# Patient Record
Sex: Female | Born: 1940 | Race: White | Hispanic: No | Marital: Single | Smoking: Current every day smoker
Health system: Southern US, Community
[De-identification: ages and names within clinical notes are randomized; demographics above are authoritative.]

## PROBLEM LIST (undated history)

## (undated) DIAGNOSIS — F419 Anxiety disorder, unspecified: Secondary | ICD-10-CM

## (undated) DIAGNOSIS — Z87442 Personal history of urinary calculi: Secondary | ICD-10-CM

## (undated) DIAGNOSIS — I712 Thoracic aortic aneurysm, without rupture, unspecified: Secondary | ICD-10-CM

## (undated) DIAGNOSIS — I429 Cardiomyopathy, unspecified: Secondary | ICD-10-CM

## (undated) DIAGNOSIS — M1990 Unspecified osteoarthritis, unspecified site: Secondary | ICD-10-CM

## (undated) DIAGNOSIS — F32A Depression, unspecified: Secondary | ICD-10-CM

## (undated) DIAGNOSIS — J449 Chronic obstructive pulmonary disease, unspecified: Secondary | ICD-10-CM

## (undated) DIAGNOSIS — I700 Atherosclerosis of aorta: Secondary | ICD-10-CM

## (undated) DIAGNOSIS — I10 Essential (primary) hypertension: Secondary | ICD-10-CM

## (undated) DIAGNOSIS — C3490 Malignant neoplasm of unspecified part of unspecified bronchus or lung: Secondary | ICD-10-CM

## (undated) DIAGNOSIS — I639 Cerebral infarction, unspecified: Secondary | ICD-10-CM

## (undated) DIAGNOSIS — C801 Malignant (primary) neoplasm, unspecified: Secondary | ICD-10-CM

## (undated) DIAGNOSIS — E119 Type 2 diabetes mellitus without complications: Secondary | ICD-10-CM

## (undated) DIAGNOSIS — I779 Disorder of arteries and arterioles, unspecified: Secondary | ICD-10-CM

## (undated) DIAGNOSIS — Z720 Tobacco use: Secondary | ICD-10-CM

## (undated) HISTORY — PX: EYE SURGERY: SHX253

## (undated) HISTORY — PX: CHOLECYSTECTOMY: SHX55

## (undated) HISTORY — PX: ABDOMINAL HYSTERECTOMY: SHX81

---

## 2006-01-10 ENCOUNTER — Ambulatory Visit: Payer: Self-pay | Admitting: Family Medicine

## 2006-01-10 IMAGING — MG UNKNOWN MG STUDY
1 series · 4 of 4 positions shown · non-contrast
Comparison: none

REASON FOR EXAM: Screening Mammo
COMMENTS:

[R CC · right · 4 of 4 slices shown]
[im 1/4]
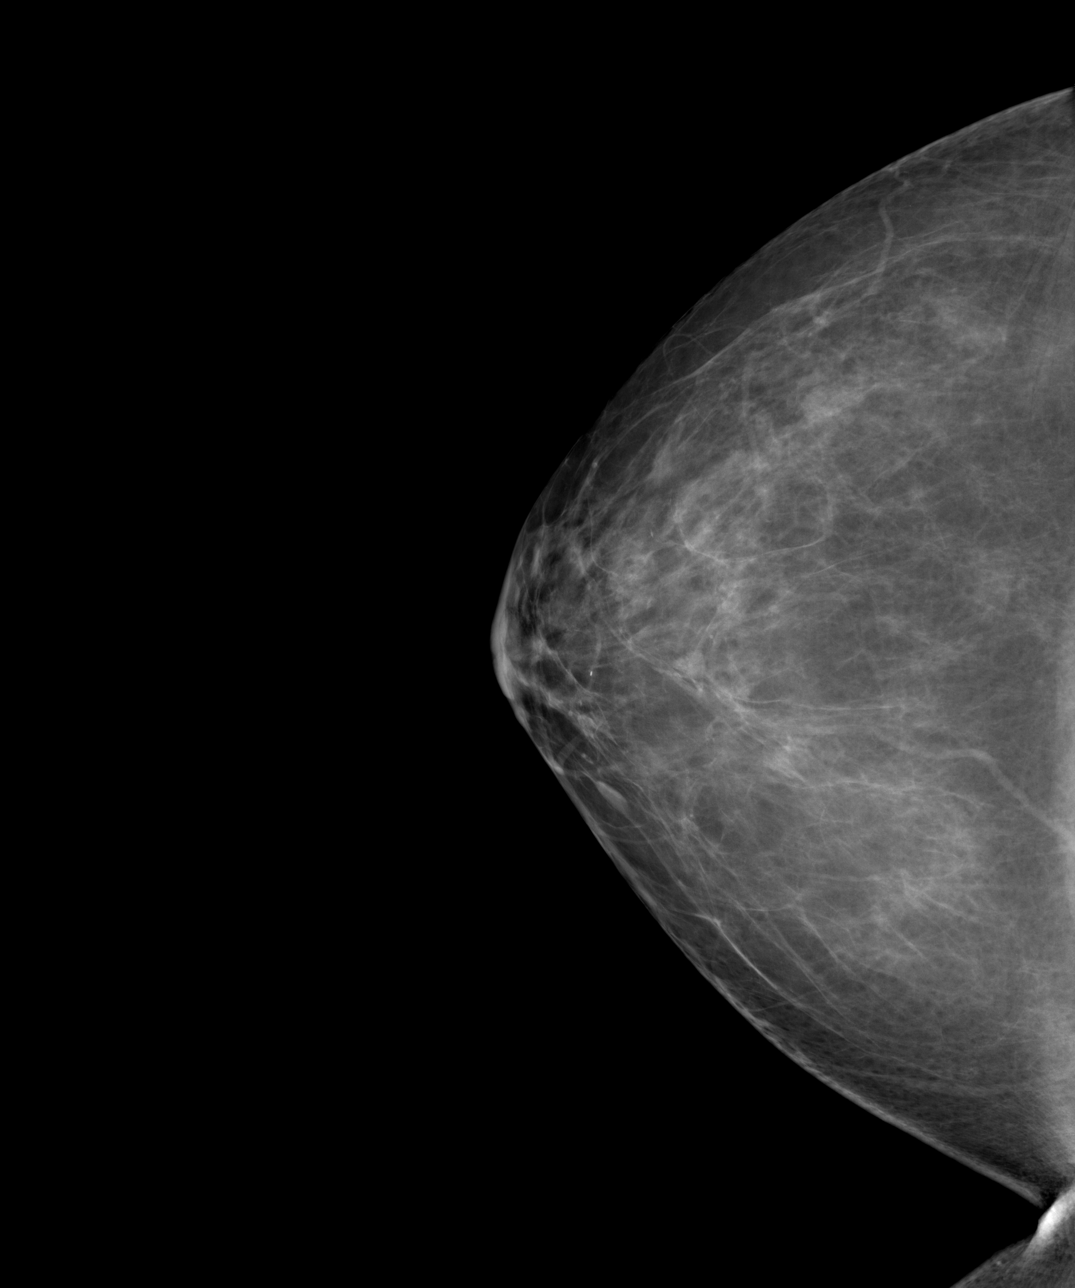
[im 2/4]
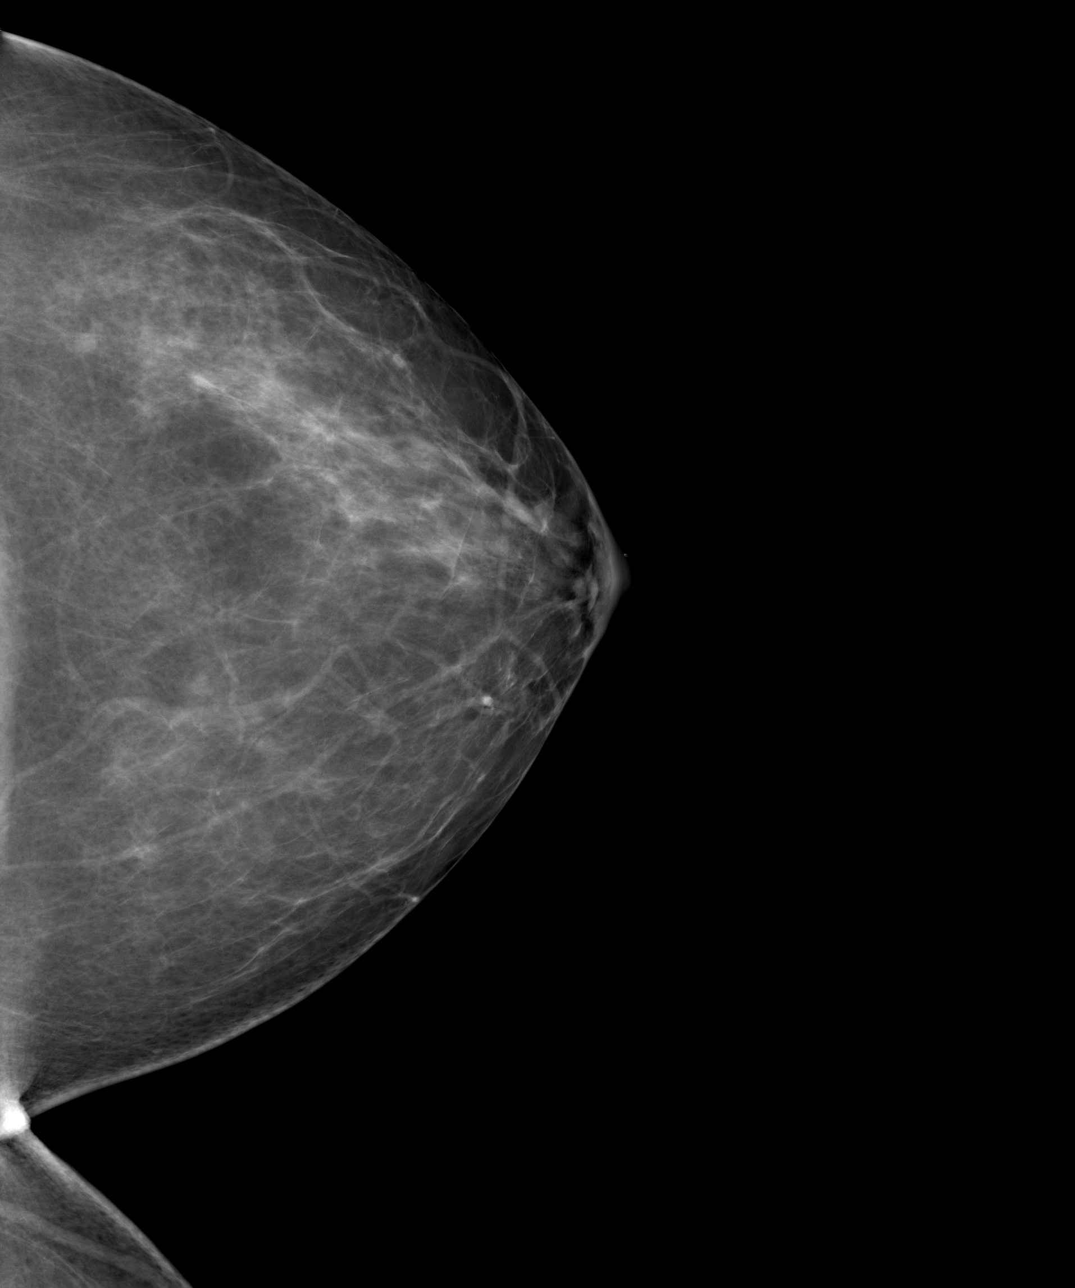
[im 3/4]
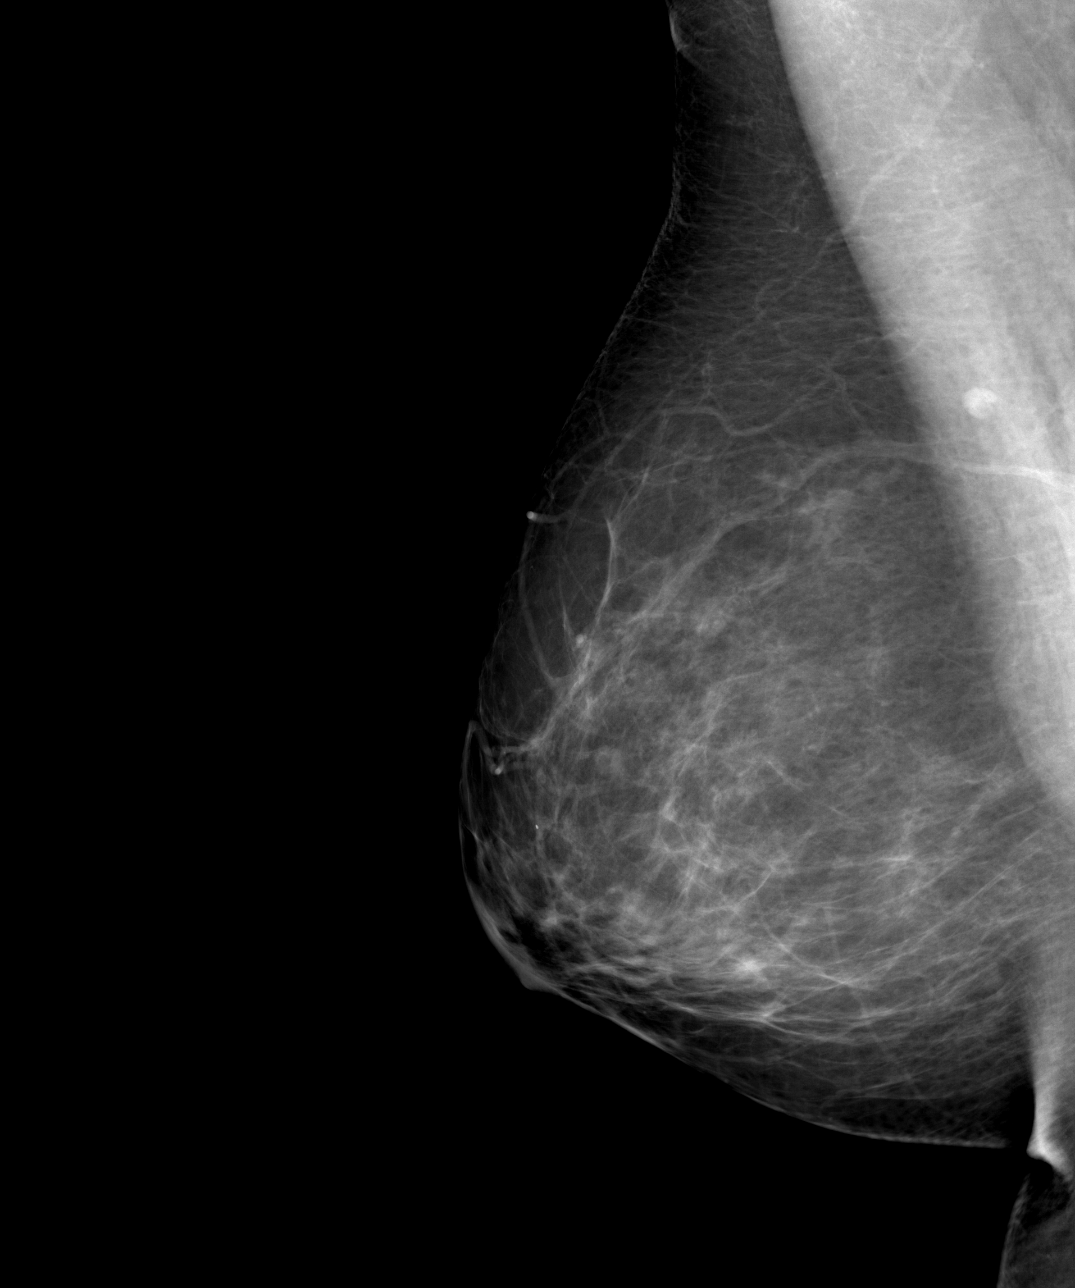
[im 4/4]
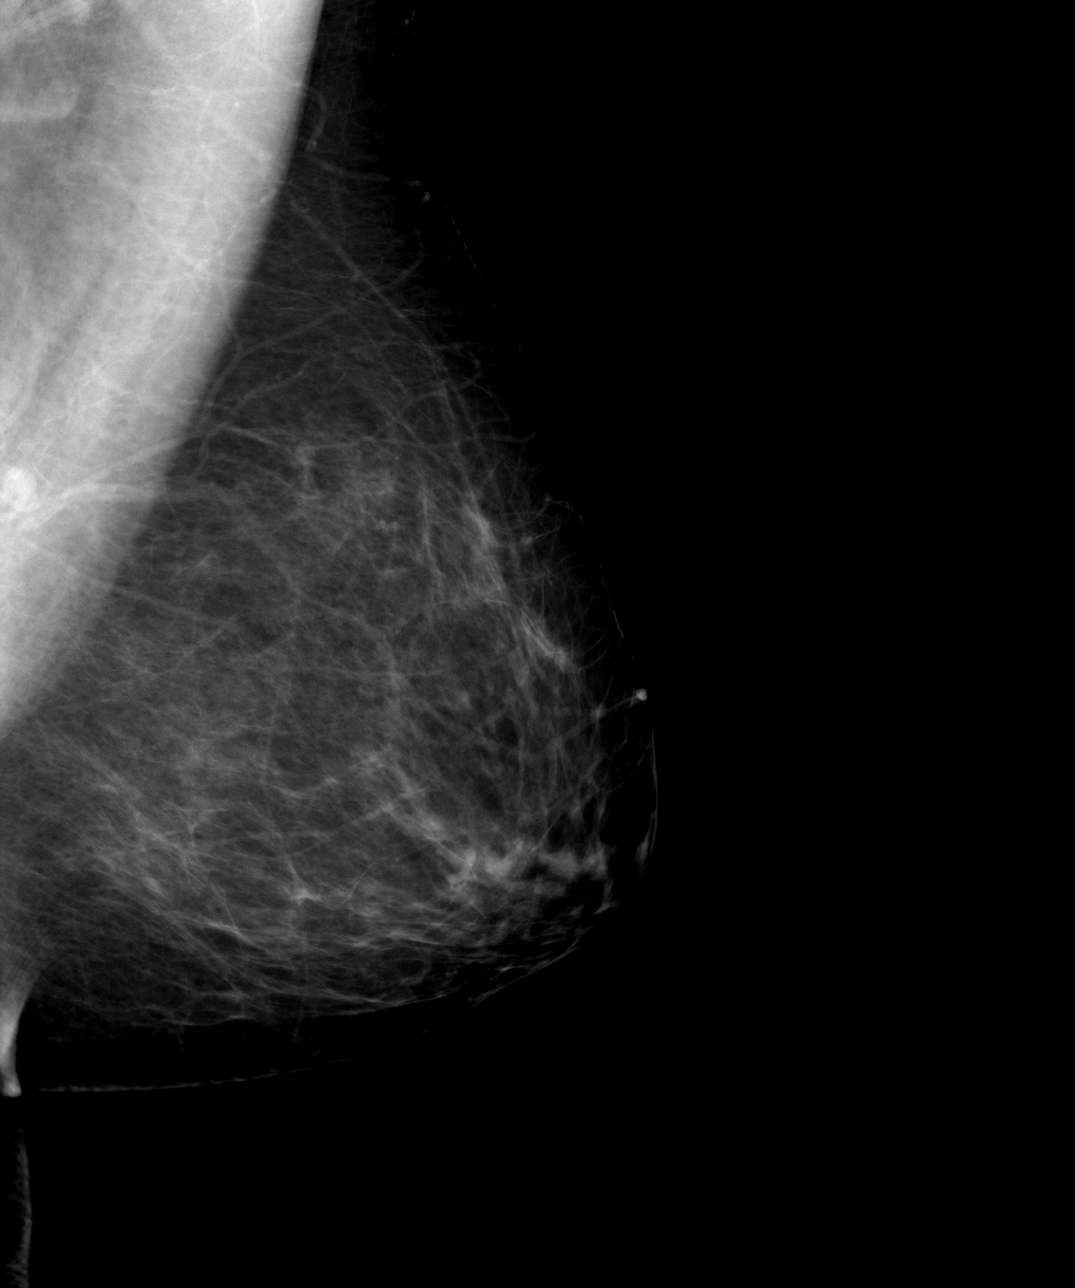

[4 of 4 positions shown; findings below may reference images not displayed]

PROCEDURE:     MAM - MAM DGTL SCREENING MAMMO W/CAD  - [DATE]  [DATE]

RESULT:     Comparison is made to a study [DATE].

The breasts exhibit a moderately dense parenchymal pattern. There is no
dominant mass. There are benign appearing lymph nodes in the axillary region
on the LEFT. A nodule consistent with an intramammary lymph node on the
RIGHT is present as well.
IMPRESSION: 1)I do not see findings suspicious for malignancy.

BI-RADS: Category 2-Benign Finding

RECOMMENDATIONS:

1)Please continue to encourage yearly mammographic follow-up.

A NEGATIVE MAMMOGRAM REPORT DOES NOT PRECLUDE BIOPSY OR OTHER EVALUATION OF
A CLINICALLY PALPABLE OR OTHERWISE SUSPICIOUS MASS OR LESION.  BREAST CANCER
MAY NOT BE DETECTED BY MAMMOGRAPHY IN UP TO 10% OF CASES.

## 2007-04-15 ENCOUNTER — Ambulatory Visit: Payer: Self-pay | Admitting: Family Medicine

## 2007-04-15 IMAGING — MG MAM DGTL SCREENING MAMMO W/CAD
1 series · 4 of 4 positions shown · non-contrast
Comparison: none

REASON FOR EXAM: SCR MAMMO
COMMENTS:

PROCEDURE:     MAM - MAM DGTL SCREENING MAMMO W/CAD  - [DATE]  [DATE]
RESULT:      Comparison is made to prior examinations of [DATE] and
[DATE].
No mass or malignant appearing calcifications are seen. The breast
parenchyma is predominantly fatty.

[Series 3035: R CC · right · 4 of 4 slices shown]
[im 1/4]
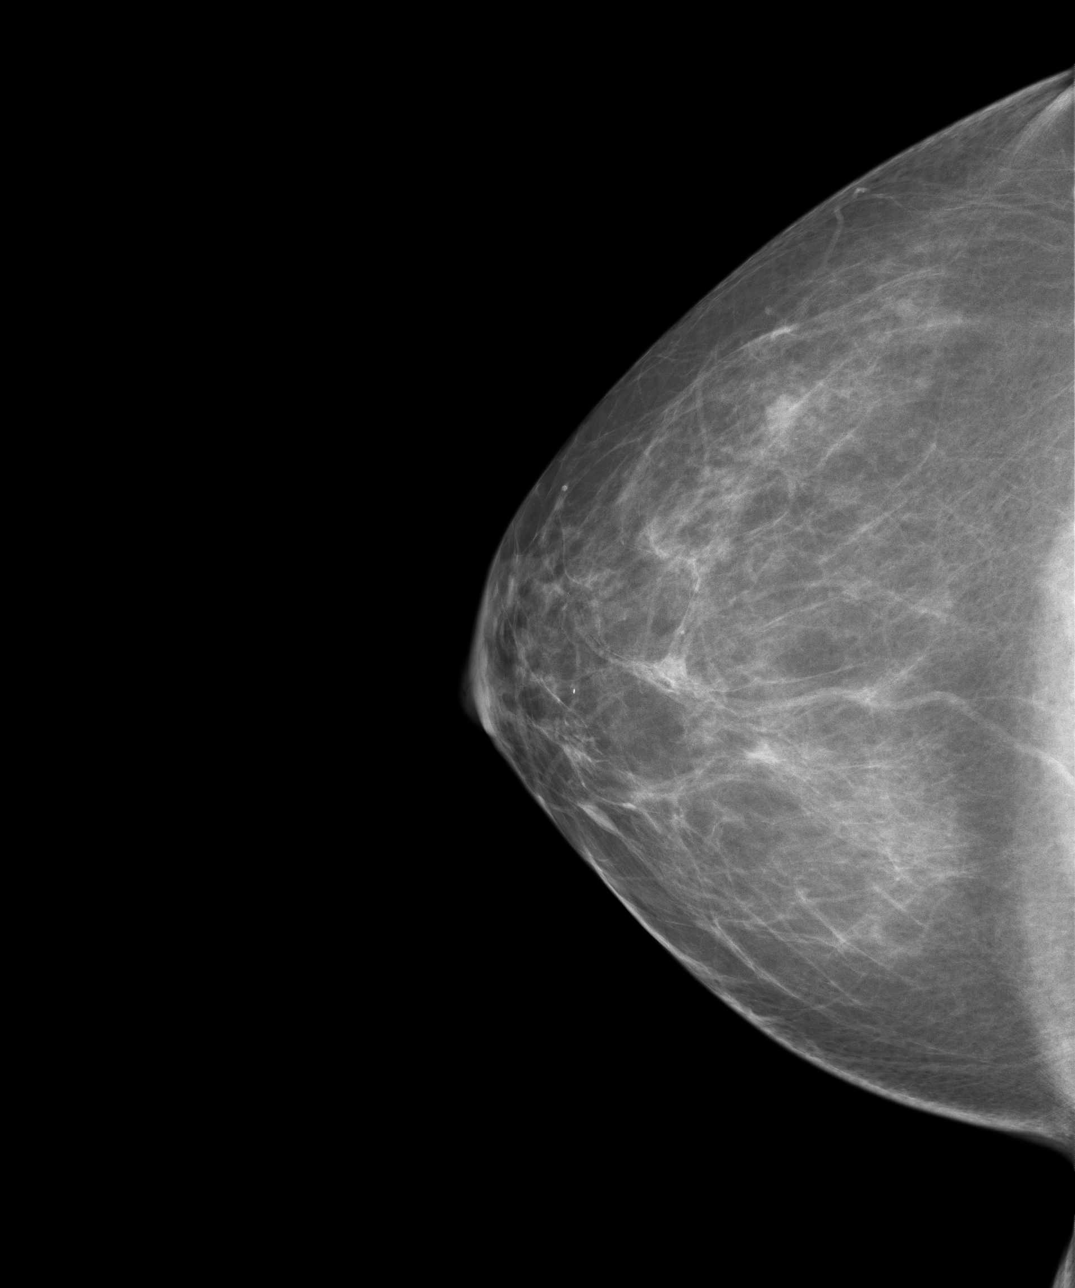
[im 2/4]
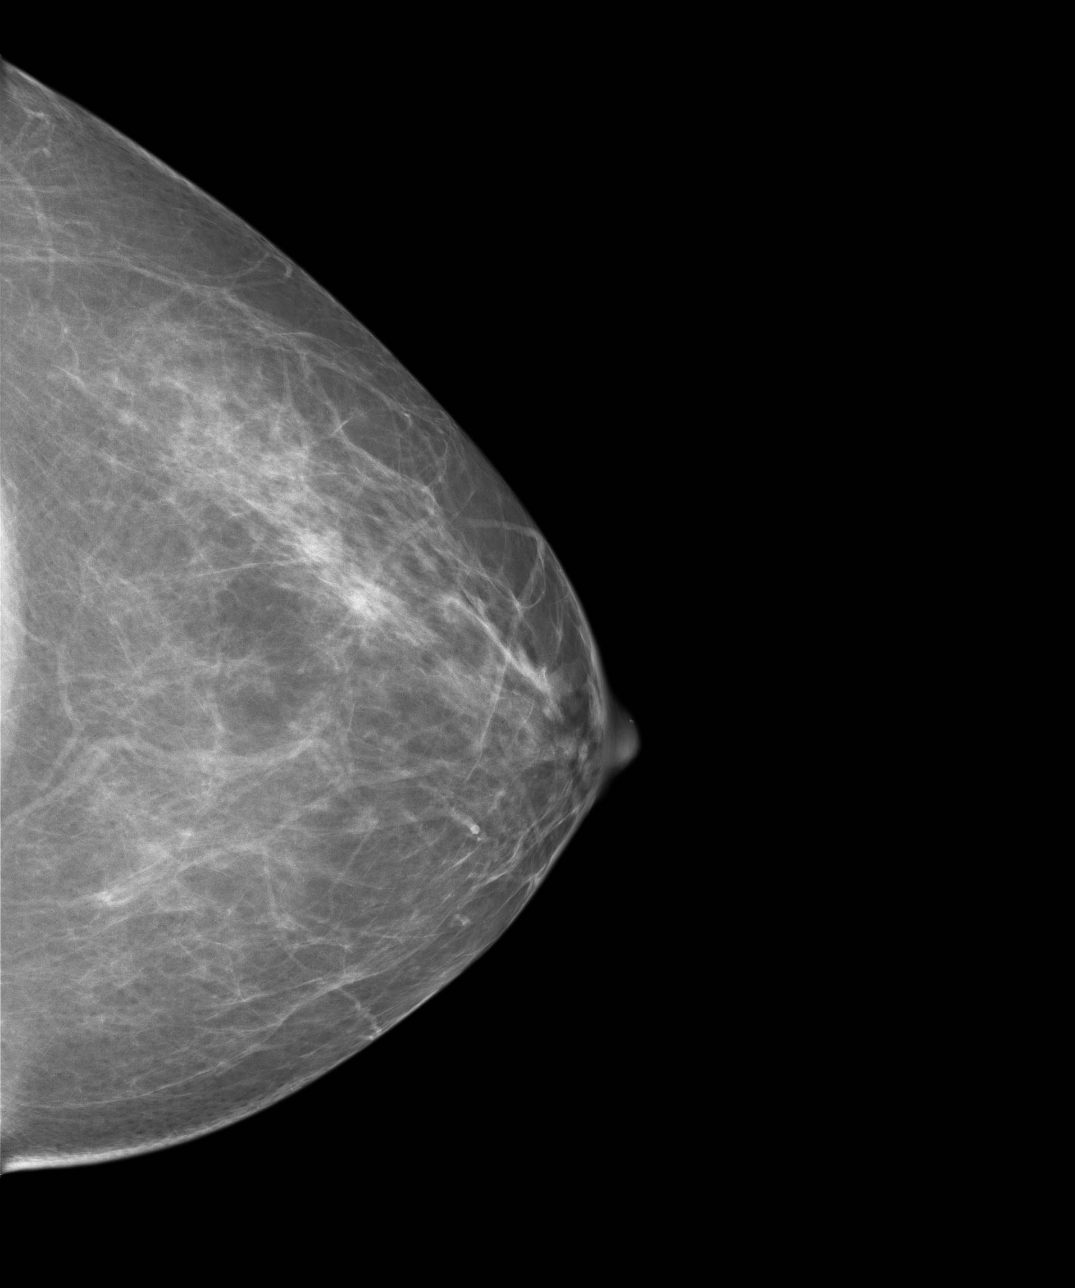
[im 3/4]
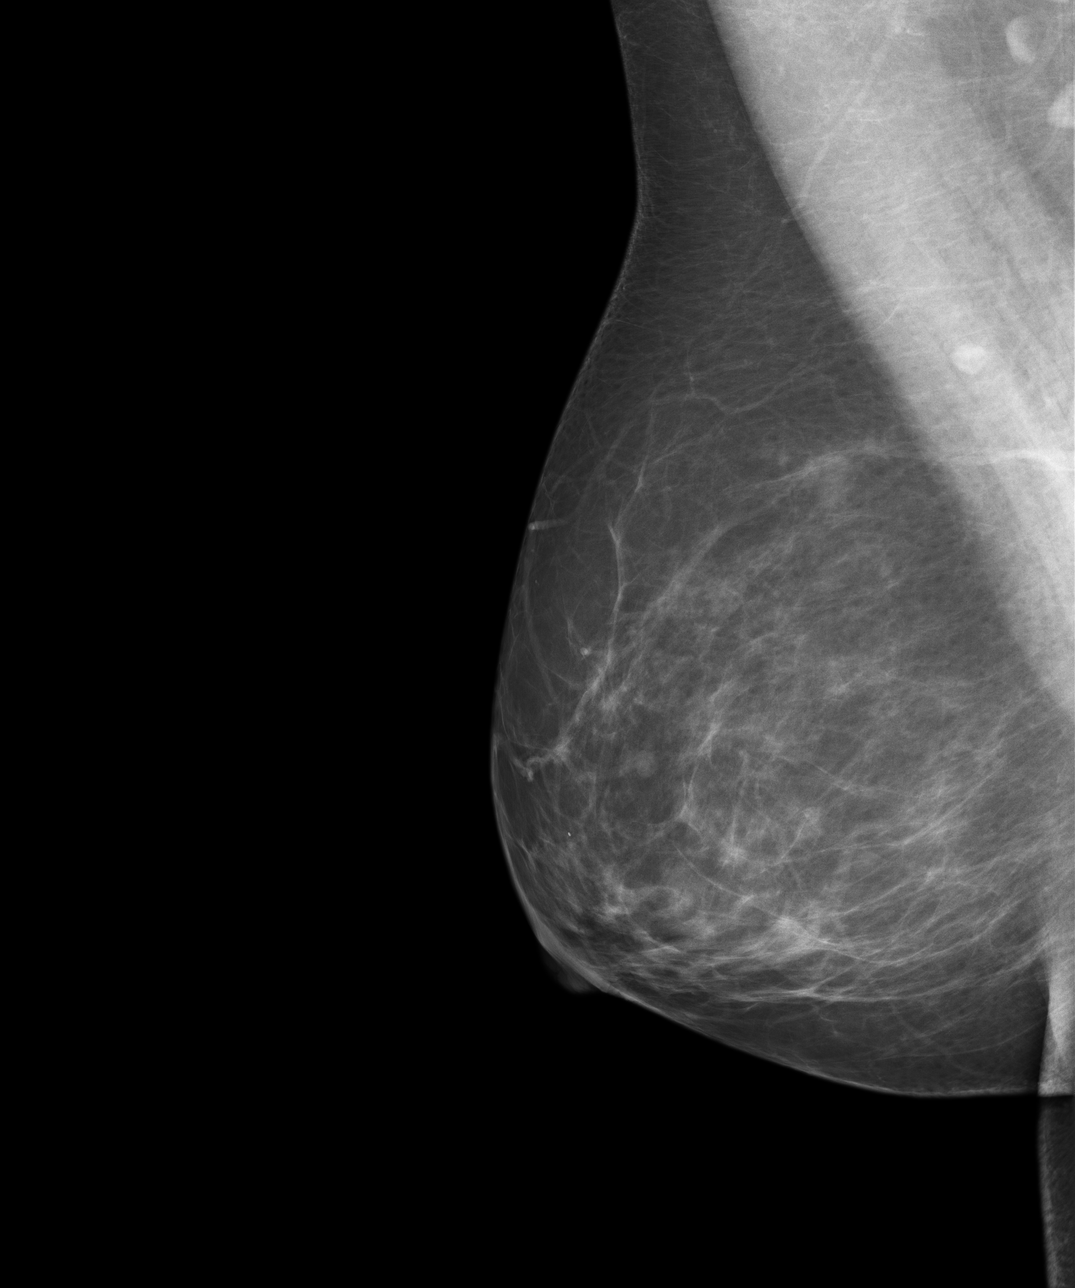
[im 4/4]
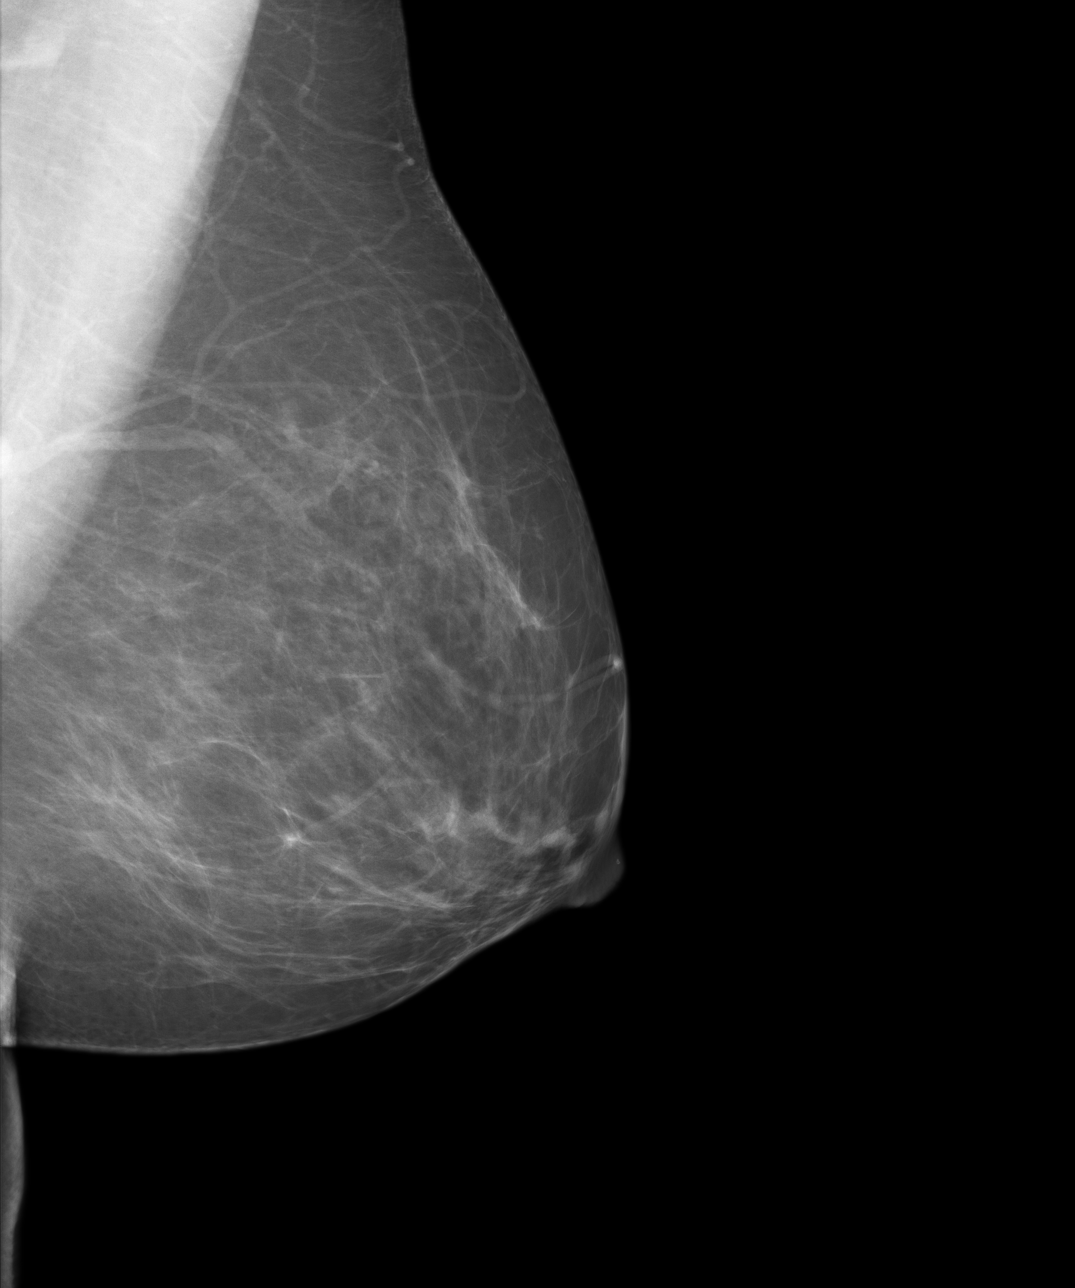

[4 of 4 positions shown; findings below may reference images not displayed]

IMPRESSION: 1.     Bilaterally benign appearing screening mammography.
2.     Continued annual screening mammography is recommended.
3.     BI-RADS:  Category 2-Benign Finding.

A NEGATIVE MAMMOGRAM REPORT DOES NOT PRECLUDE BIOPSY OR OTHER EVALUATION OF
A CLINICALLY PALPABLE OR OTHERWISE SUPSICIOUS MASS OR LESION.  BREAST CANCER
MAY NOT BE DETECTED BY MAMMOGRAPHY IN UP TO 10% OF CASES.

## 2009-10-06 ENCOUNTER — Ambulatory Visit: Payer: Self-pay | Admitting: Family Medicine

## 2009-10-06 IMAGING — MG MAM DGTL SCREENING MAMMO W/CAD
1 series · 4 of 4 positions shown · non-contrast
Comparison: none

REASON FOR EXAM: SCREENING
COMMENTS:

[Series 998: R CC · right · 4 of 4 slices shown]
[im 1/4]
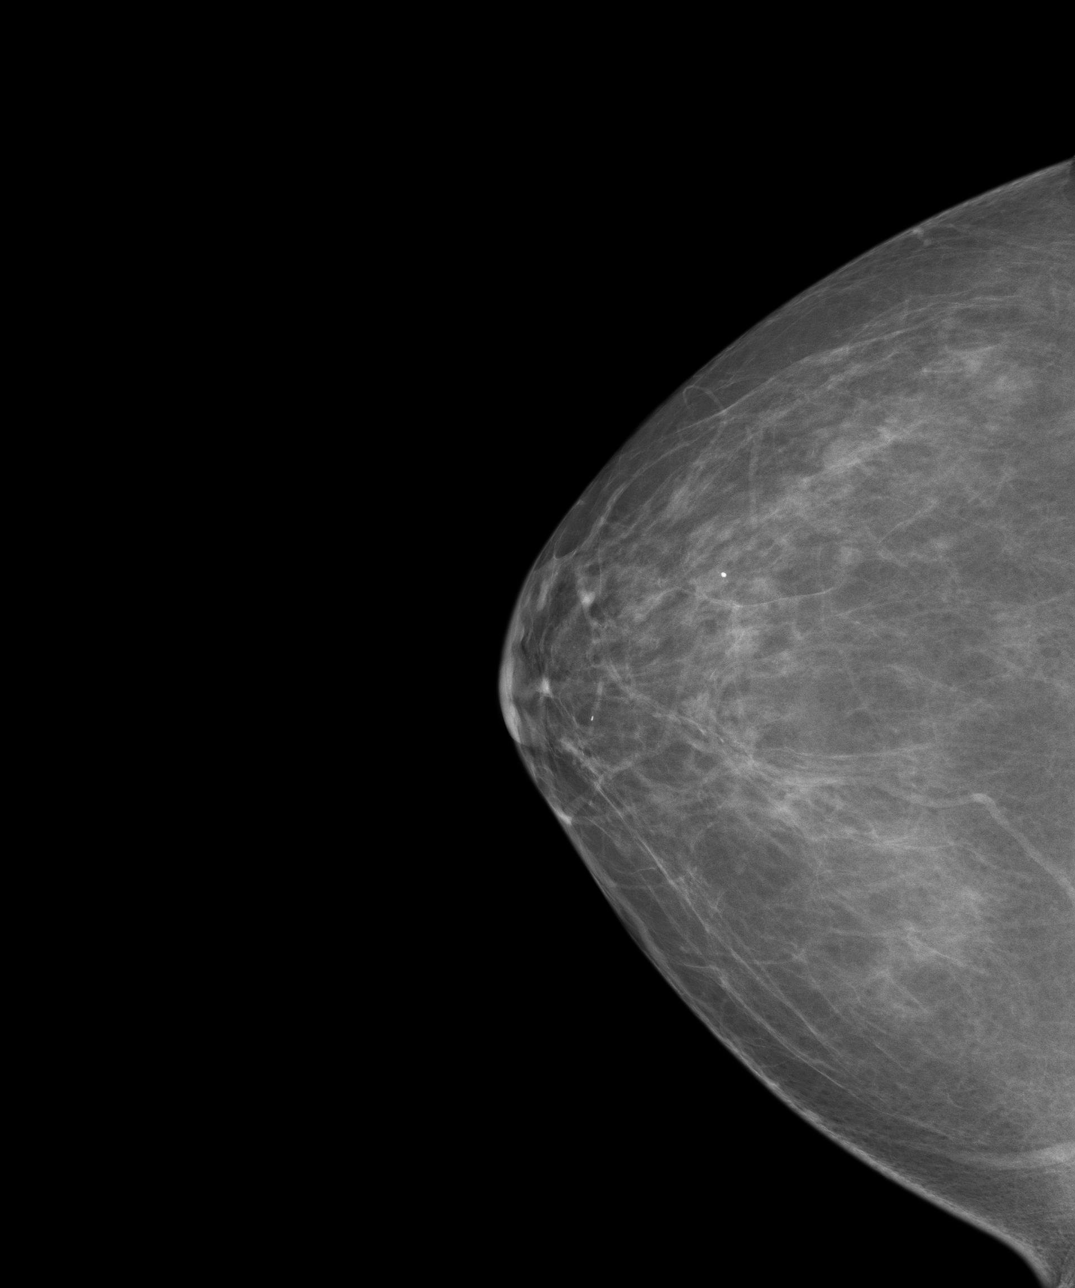
[im 2/4]
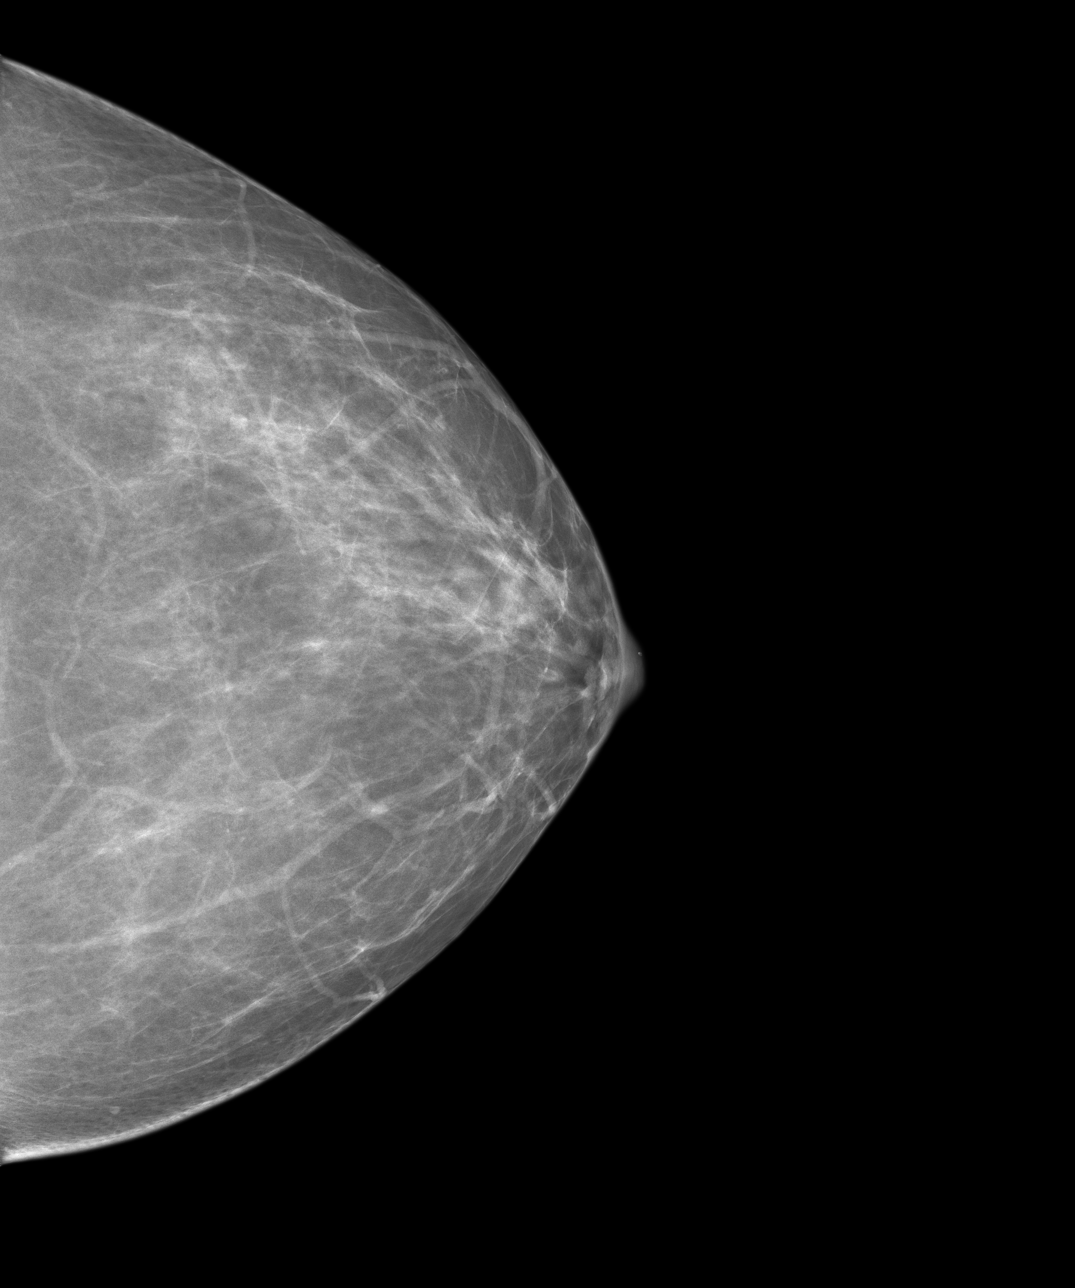
[im 3/4]
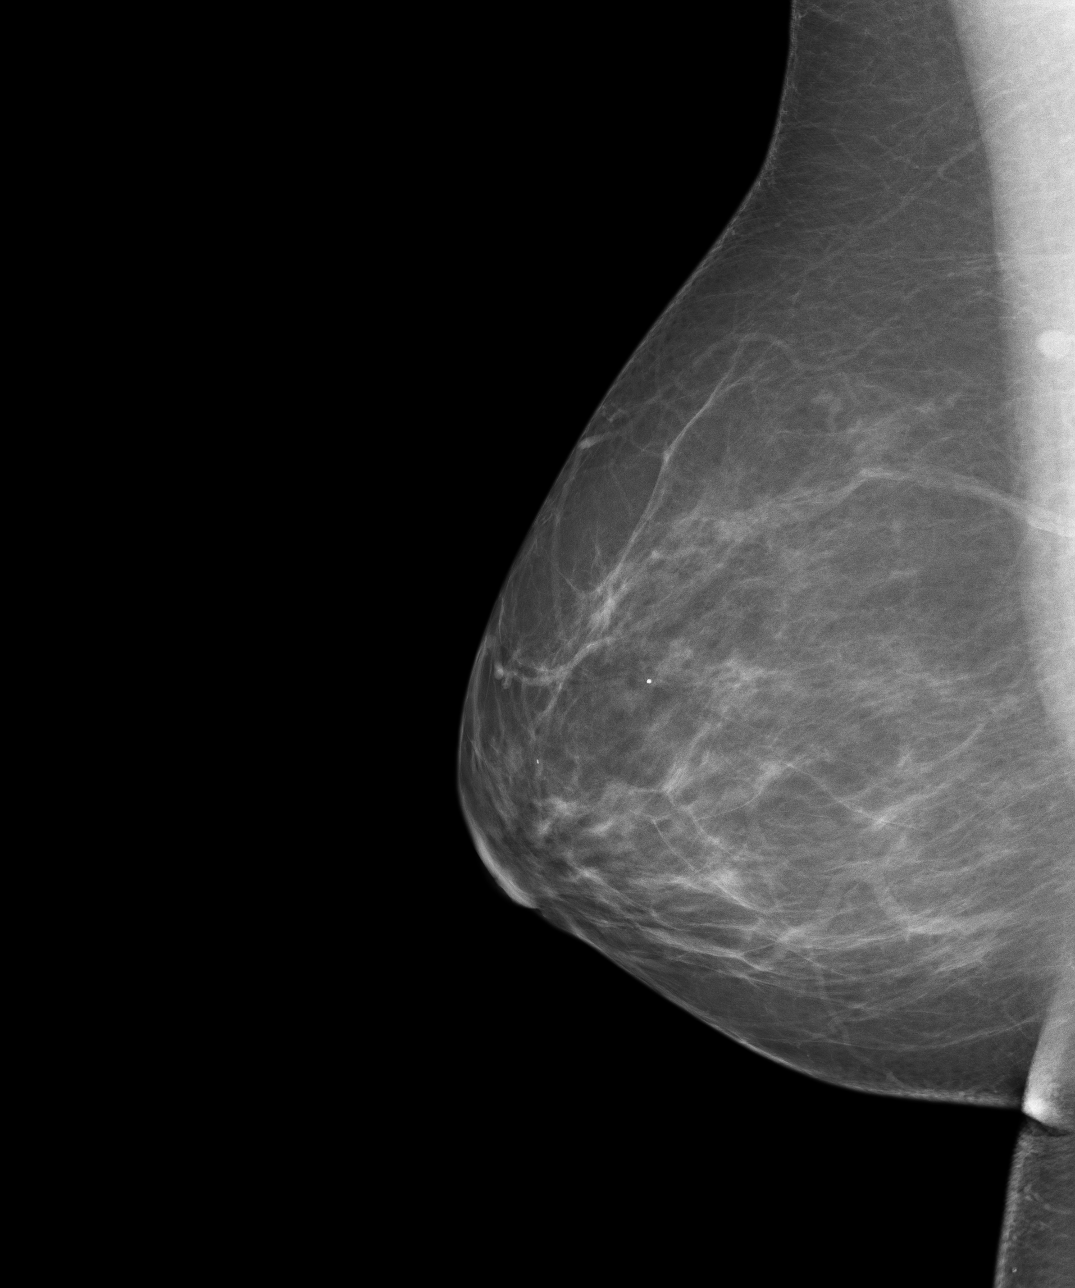
[im 4/4]
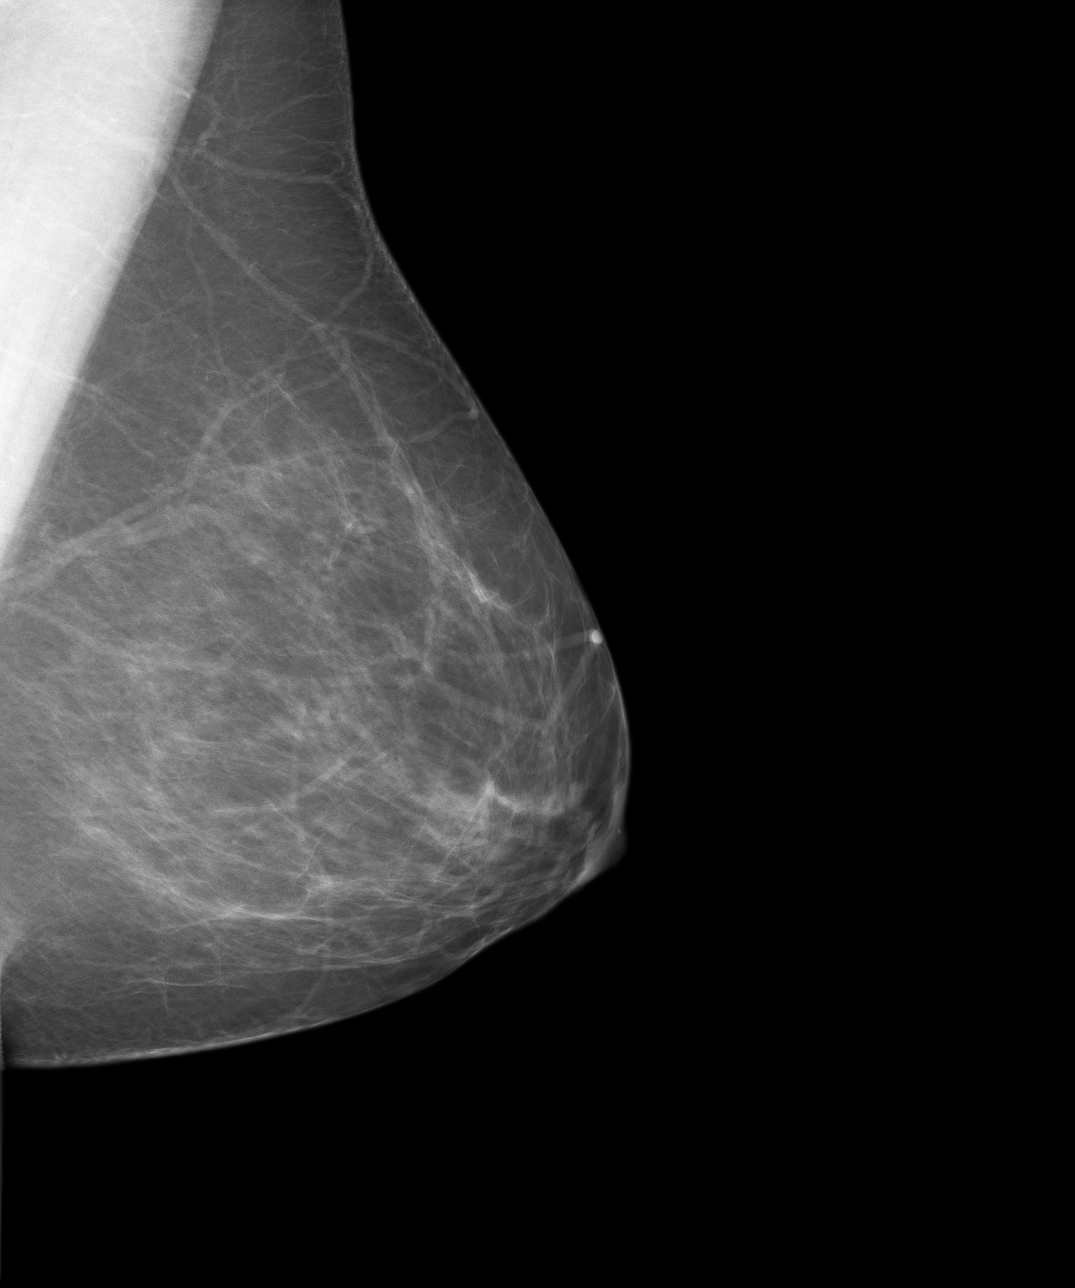

[4 of 4 positions shown; findings below may reference images not displayed]

PROCEDURE:     MAM - MAM DGTL SCREENING MAMMO W/CAD  - [DATE]  [DATE]

RESULT:     Comparison is made to a prior digital study [DATE]
and [DATE].

The breasts exhibit a moderately dense parenchymal pattern with evidence of
ongoing involution. There is no dominant mass. There are no malignant
appearing groupings of microcalcification.
IMPRESSION: 1.I do not see findings suspicious for malignancy.

BI-RADS: Category 2 - Benign Findings

RECOMMENDATIONS:

1.     Please continue to encourage yearly mammographic follow-up.

A NEGATIVE MAMMOGRAM REPORT DOES NOT PRECLUDE BIOPSY OR OTHER EVALUATION OF
A CLINICALLY PALPABLE OR OTHERWISE SUSPICIOUS MASS OR LESION. BREAST CANCER
MAY NOT BE DETECTED BY MAMMOGRAPHY IN UP TO 10% OF CASES.

## 2011-06-28 ENCOUNTER — Ambulatory Visit: Payer: Self-pay | Admitting: Family Medicine

## 2011-06-28 IMAGING — MG MAM DGTL SCRN MAM NO ORDER W/CAD
1 series · 4 of 4 positions shown · non-contrast
Comparison: none

REASON FOR EXAM: SCR MAMMO NO ORDER
COMMENTS:

PROCEDURE:     MAM - MAM DGTL SCRN MAM NO ORDER W/CAD  - [DATE]  [DATE]
RESULT:     Comparison is made to prior examinations [DATE],[DATE] and [DATE].
There are scattered fibroglandular densities bilaterally. No dominant mass
or malignant-appearing microcalcifications are seen.

[R CC · right · 4 of 4 slices shown]
[im 1/4]
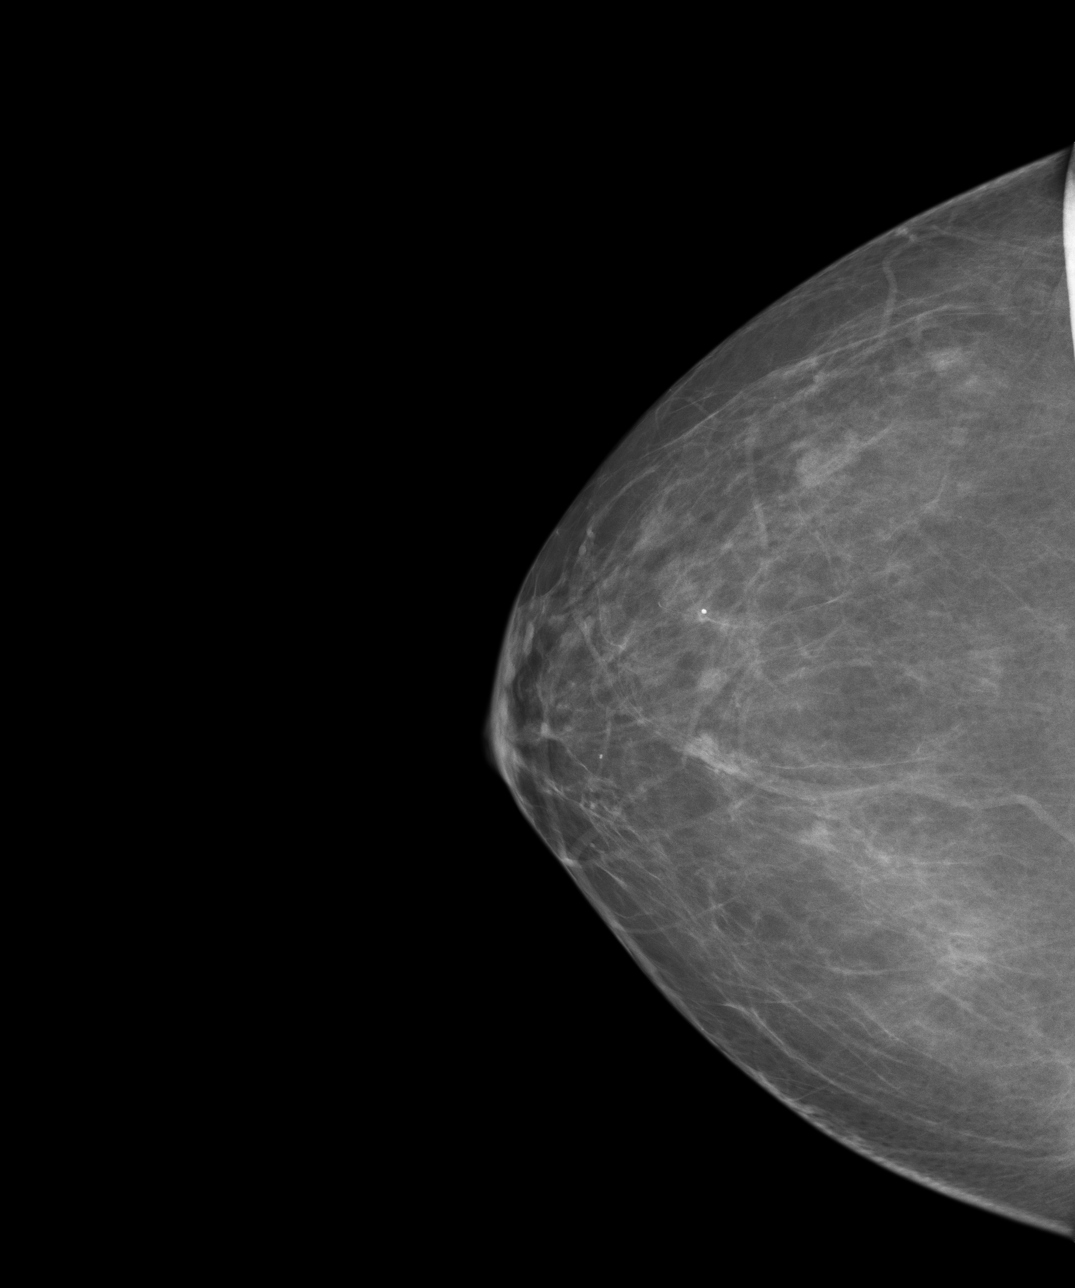
[im 2/4]
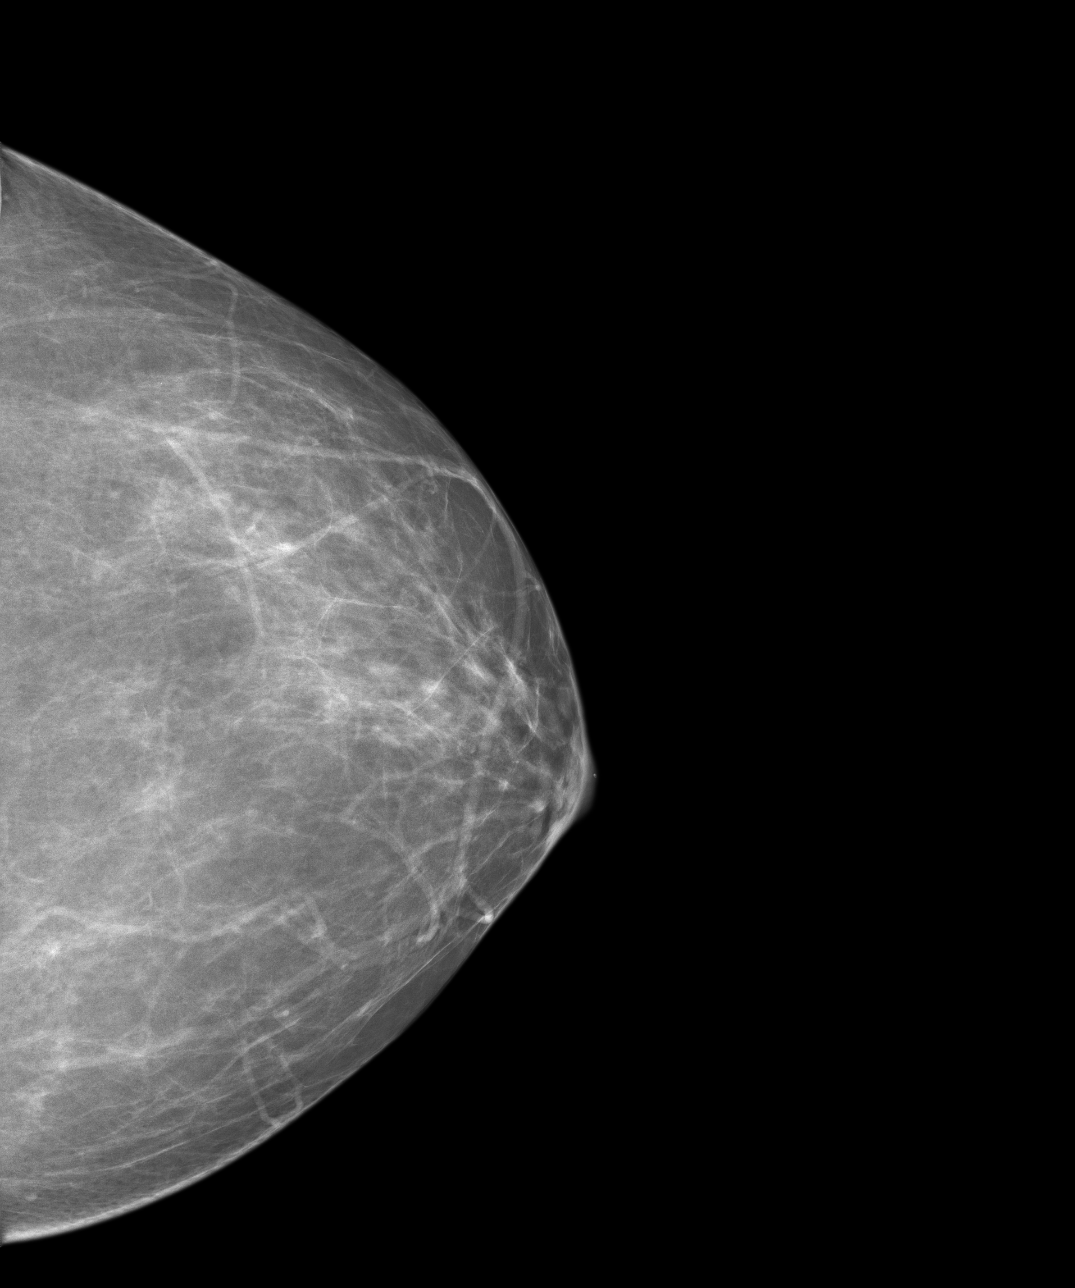
[im 3/4]
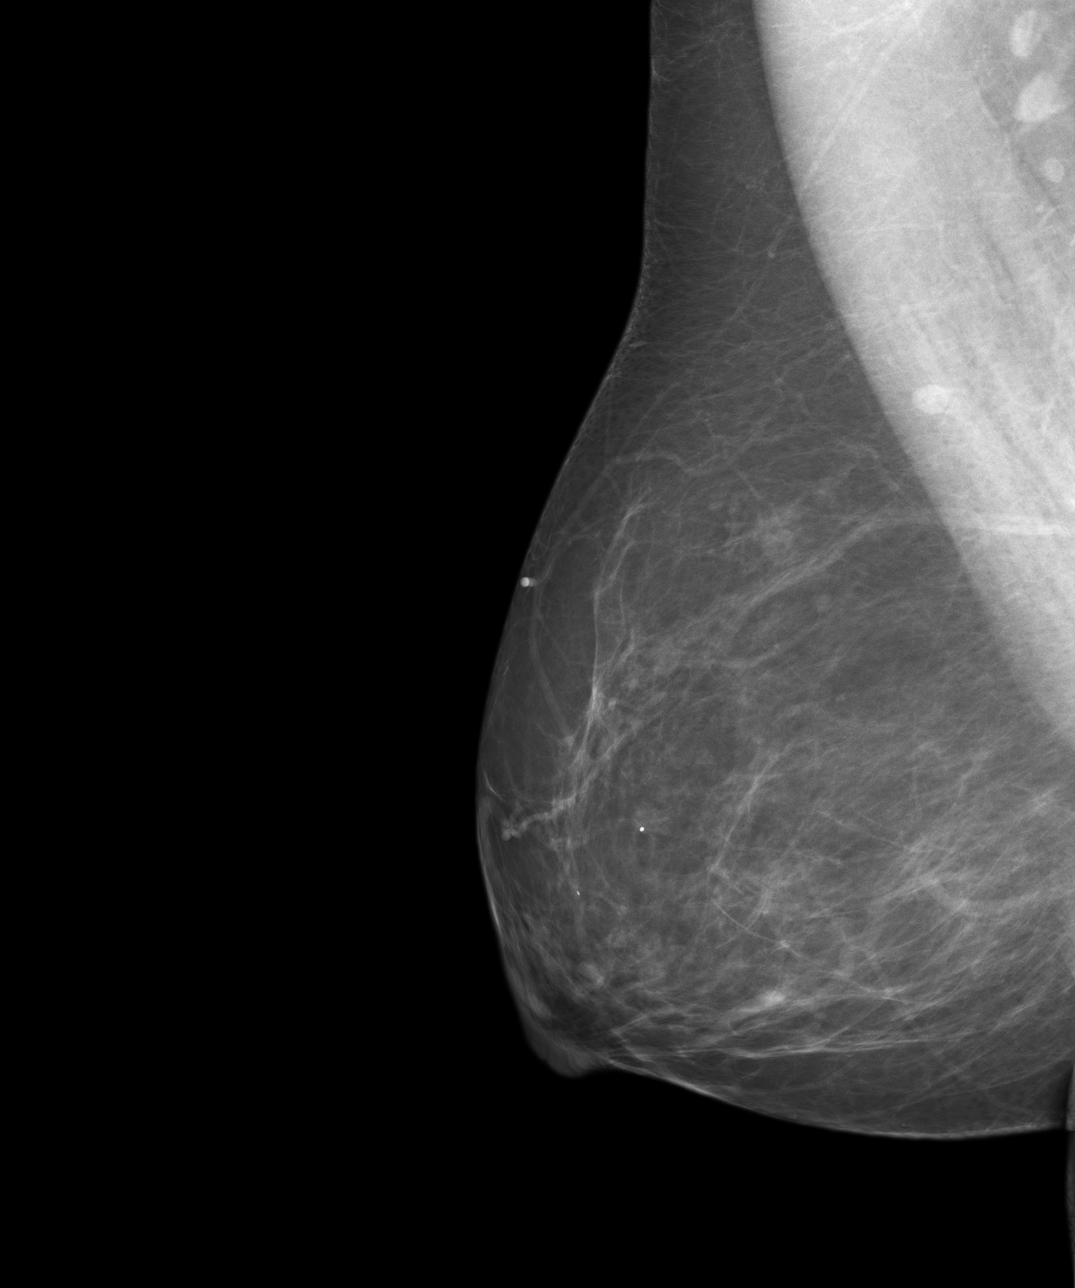
[im 4/4]
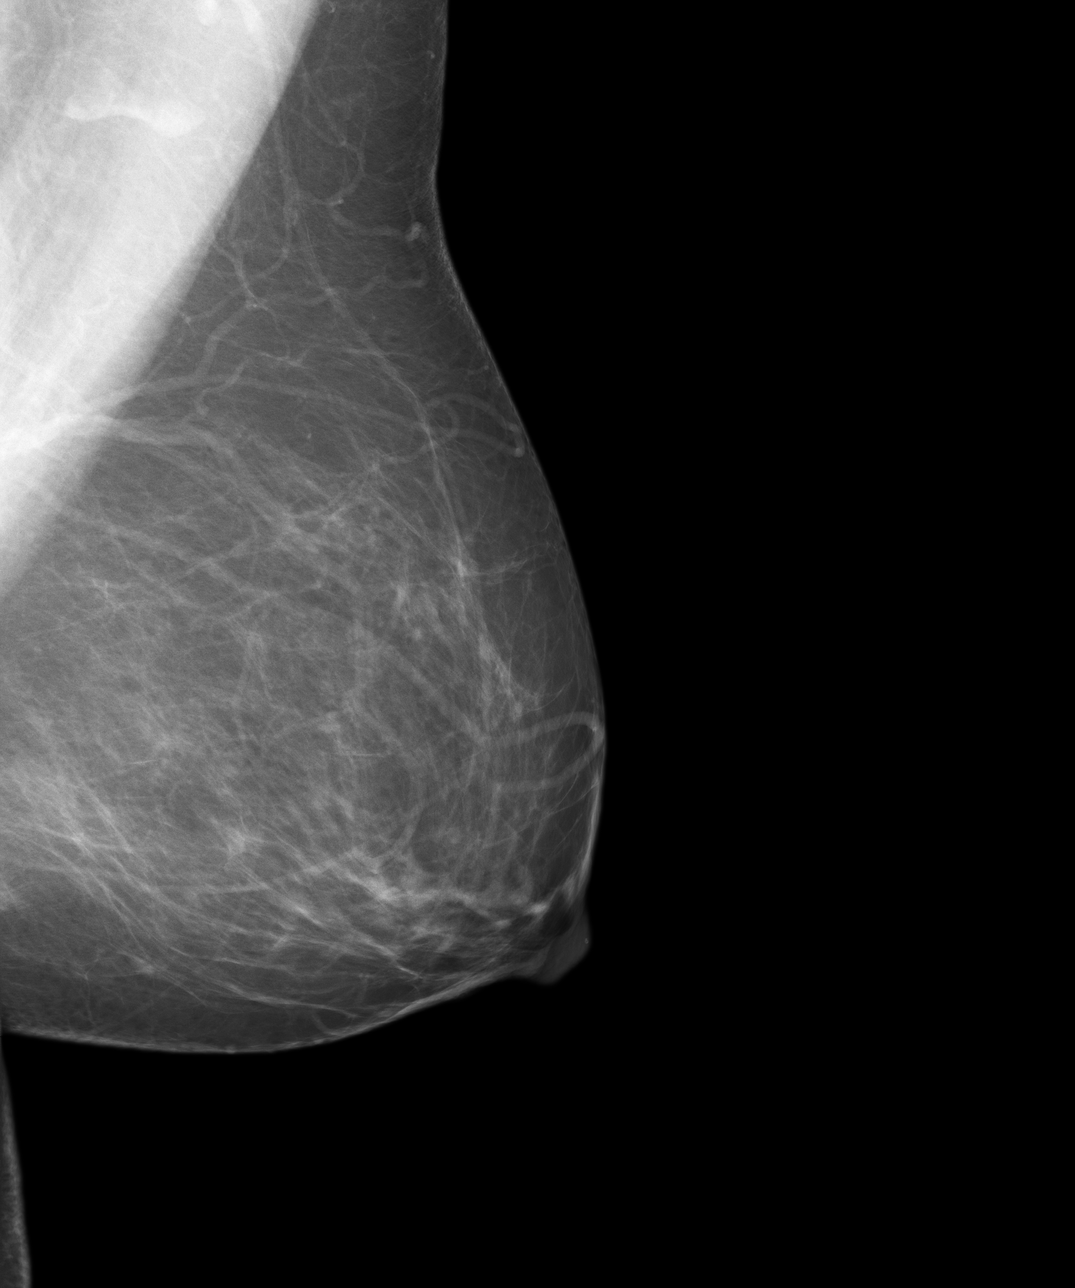

[4 of 4 positions shown; findings below may reference images not displayed]

IMPRESSION: 1. Bilaterally benign-appearing screening mammography.
2. Continued annual screening mammography is recommended.
3. BI-RADS: Category 1-Negative.

A negative mammogram report does not preclude biopsy or other evaluation of
a clinically palpable or otherwise suspicious mass or lesion.  Breast cancer
may not be detected by mammography in up to 10% of cases.

## 2015-04-23 ENCOUNTER — Other Ambulatory Visit: Payer: Self-pay | Admitting: Adult Health

## 2015-04-23 DIAGNOSIS — Z1231 Encounter for screening mammogram for malignant neoplasm of breast: Secondary | ICD-10-CM

## 2015-05-05 ENCOUNTER — Ambulatory Visit
Admission: RE | Admit: 2015-05-05 | Discharge: 2015-05-05 | Disposition: A | Discharge status: Home / Self Care | Payer: Medicare Other | Source: Ambulatory Visit | Attending: Family Medicine | Admitting: Family Medicine

## 2015-05-05 ENCOUNTER — Other Ambulatory Visit: Payer: Self-pay | Admitting: Adult Health

## 2015-05-05 DIAGNOSIS — Z1231 Encounter for screening mammogram for malignant neoplasm of breast: Secondary | ICD-10-CM | POA: Insufficient documentation

## 2015-05-05 IMAGING — MG MM DIGITAL SCREENING BILAT W/ TOMO W/ CAD
8 of 12 series · 8 of 28 positions shown · non-contrast
Comparison: Previous exam(s).

CLINICAL DATA: Screening.

EXAM:
DIGITAL SCREENING BILATERAL MAMMOGRAM WITH 3D TOMO WITH CAD

[R MLO]
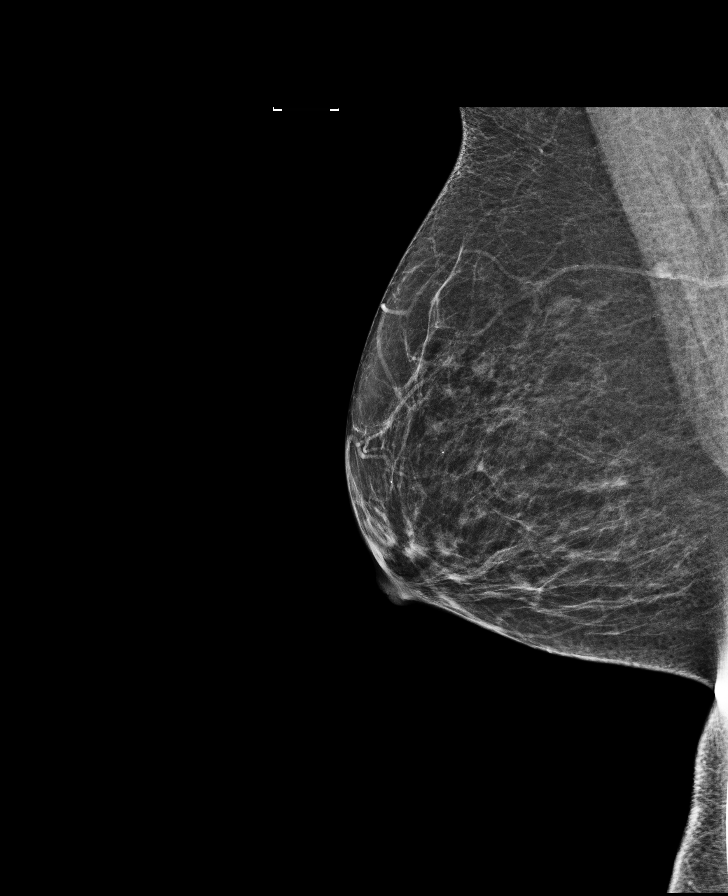

[L CC]
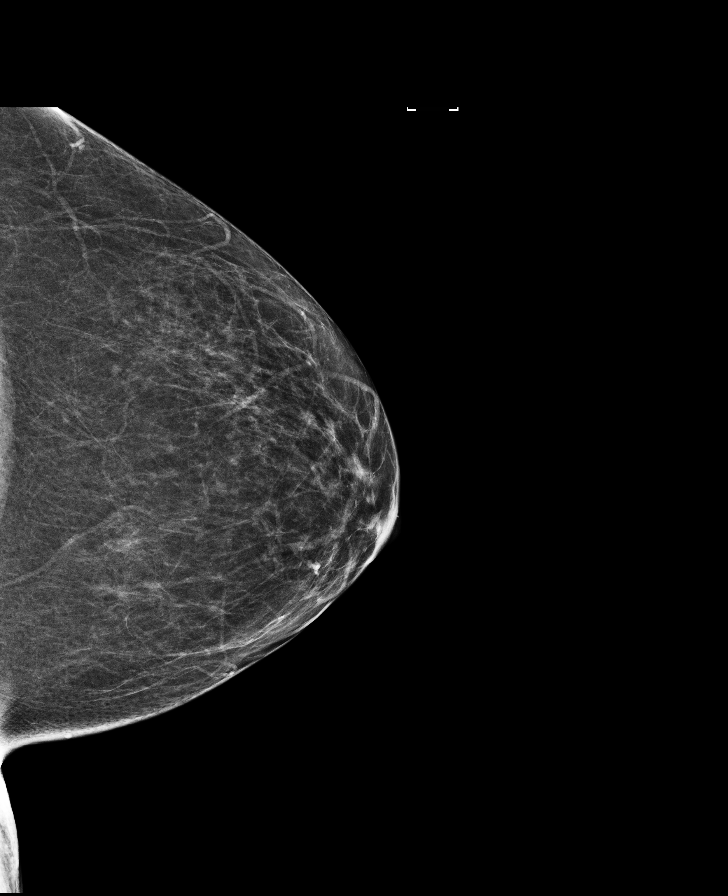

[R MLO synth-2D]
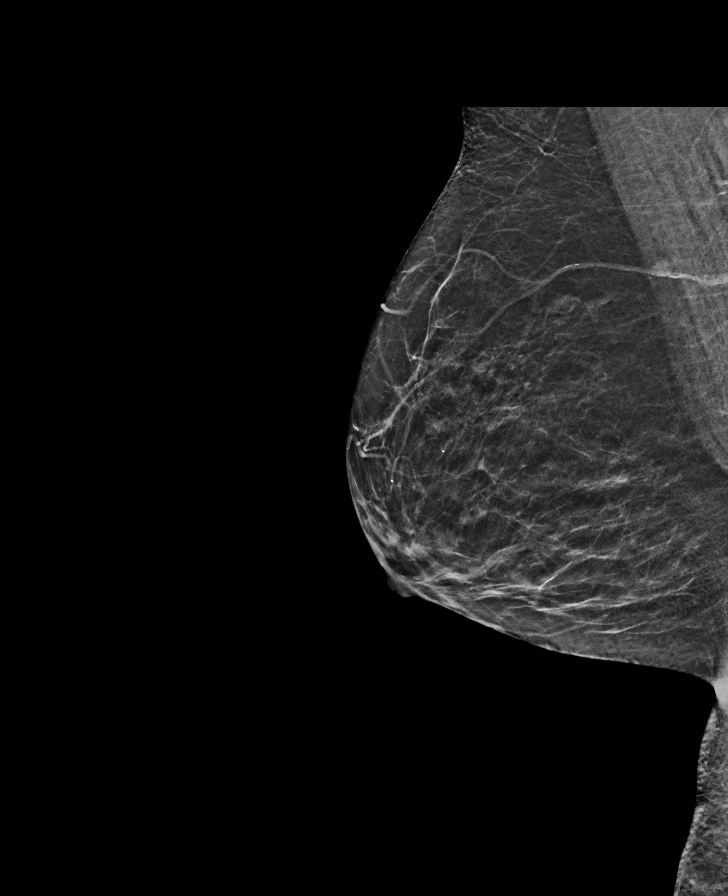

[L CC synth-2D]
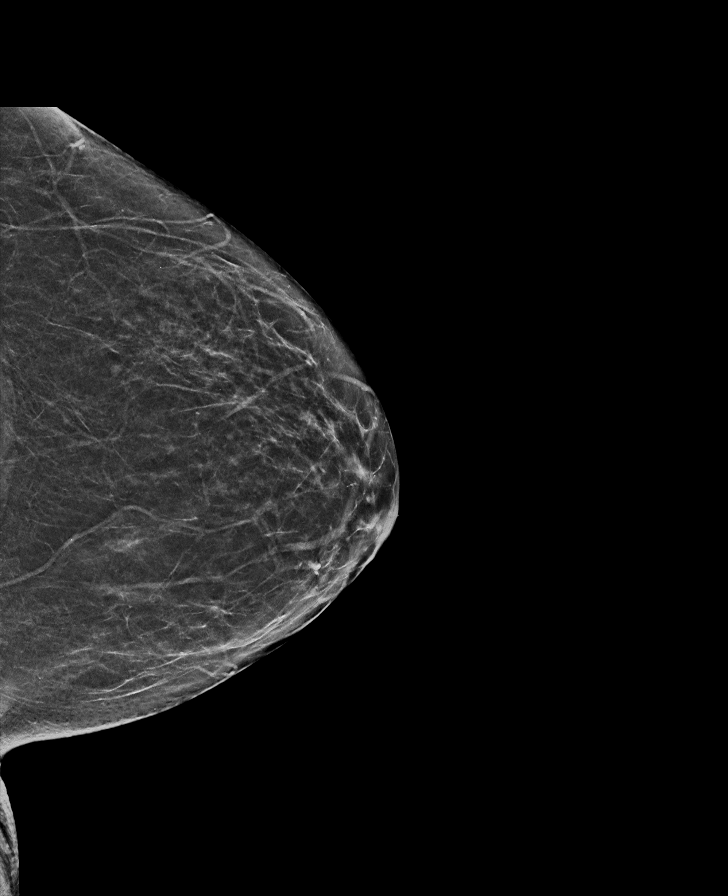

[R CC]
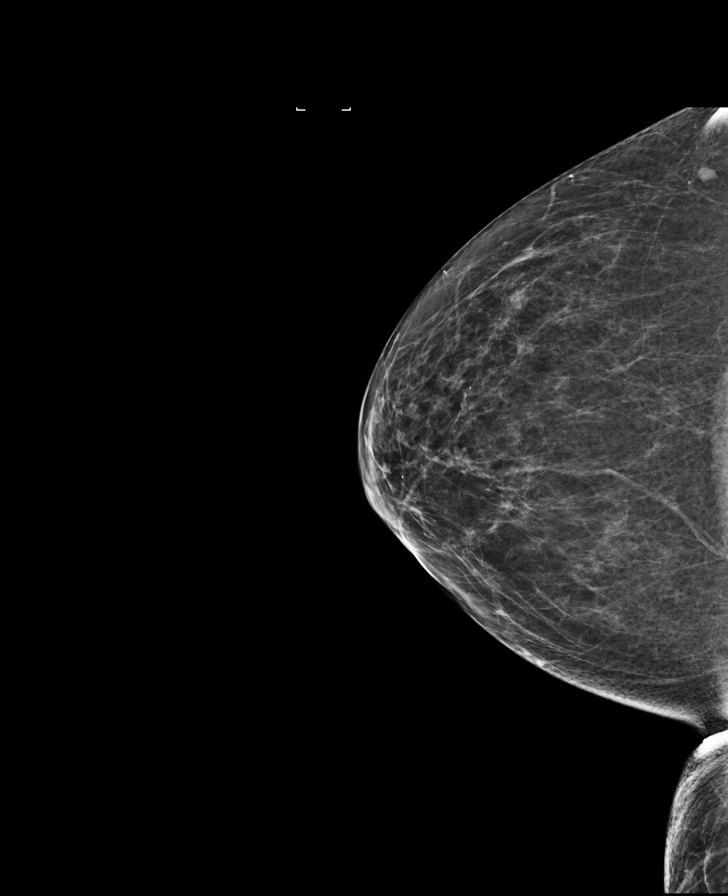

[R CC synth-2D]
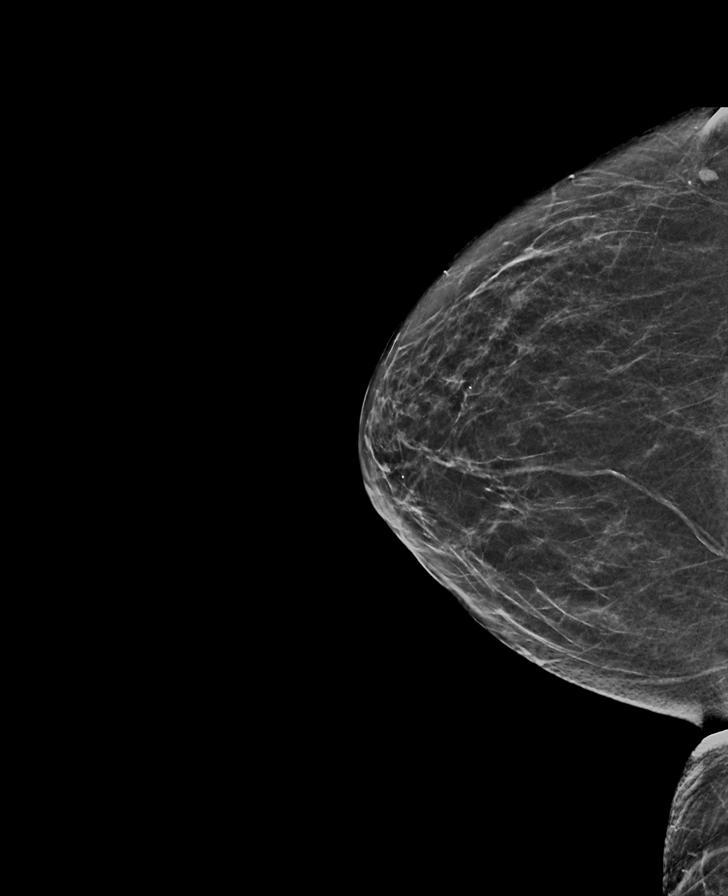

[L MLO]
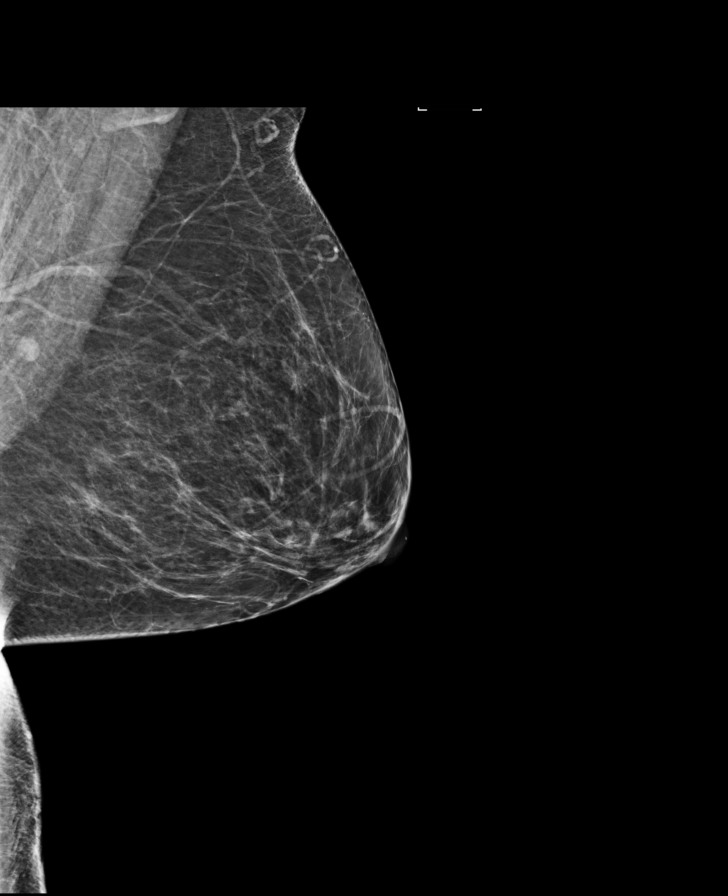

[L MLO synth-2D]
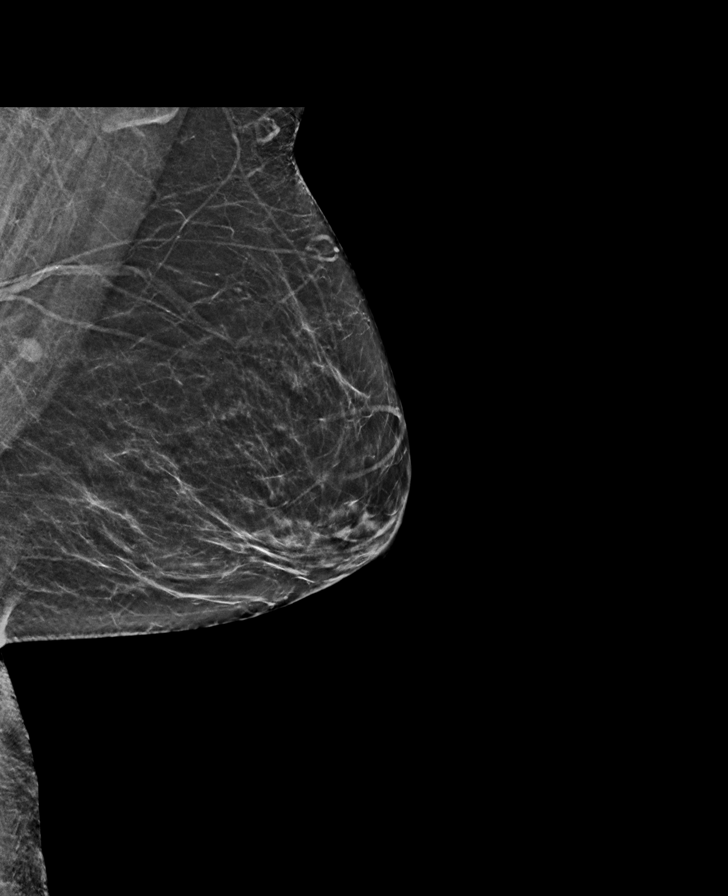

[8 of 28 positions shown; findings below may reference images not displayed]

ACR Breast Density Category b: There are scattered areas of
fibroglandular density.
FINDINGS: There are no findings suspicious for malignancy. Images were
processed with CAD.
IMPRESSION: No mammographic evidence of malignancy. A result letter of this
screening mammogram will be mailed directly to the patient.

RECOMMENDATION:
Screening mammogram in one year. (Code:[F0])

BI-RADS CATEGORY  1: Negative.

## 2016-04-24 ENCOUNTER — Other Ambulatory Visit: Payer: Self-pay | Admitting: Adult Health

## 2016-04-24 DIAGNOSIS — Z1231 Encounter for screening mammogram for malignant neoplasm of breast: Secondary | ICD-10-CM

## 2016-05-31 ENCOUNTER — Ambulatory Visit: Payer: Medicare Other

## 2017-09-18 DIAGNOSIS — M818 Other osteoporosis without current pathological fracture: Secondary | ICD-10-CM | POA: Insufficient documentation

## 2017-09-20 ENCOUNTER — Other Ambulatory Visit: Payer: Self-pay | Admitting: Family Medicine

## 2017-09-20 DIAGNOSIS — Z1239 Encounter for other screening for malignant neoplasm of breast: Secondary | ICD-10-CM

## 2017-10-17 ENCOUNTER — Ambulatory Visit
Admission: RE | Admit: 2017-10-17 | Discharge: 2017-10-17 | Disposition: A | Discharge status: Home / Self Care | Payer: Medicare Other | Source: Ambulatory Visit | Attending: Family Medicine | Admitting: Family Medicine

## 2017-10-17 DIAGNOSIS — Z1231 Encounter for screening mammogram for malignant neoplasm of breast: Secondary | ICD-10-CM | POA: Insufficient documentation

## 2017-10-17 DIAGNOSIS — Z1239 Encounter for other screening for malignant neoplasm of breast: Secondary | ICD-10-CM

## 2017-10-17 IMAGING — MG MM DIGITAL SCREENING BILAT W/ TOMO W/ CAD
6 of 10 series · 6 of 30 positions shown · non-contrast
Comparison: Previous exam(s).

CLINICAL DATA: Screening.

EXAM:
DIGITAL SCREENING BILATERAL MAMMOGRAM WITH TOMO AND CAD

[L CC synth-2D]
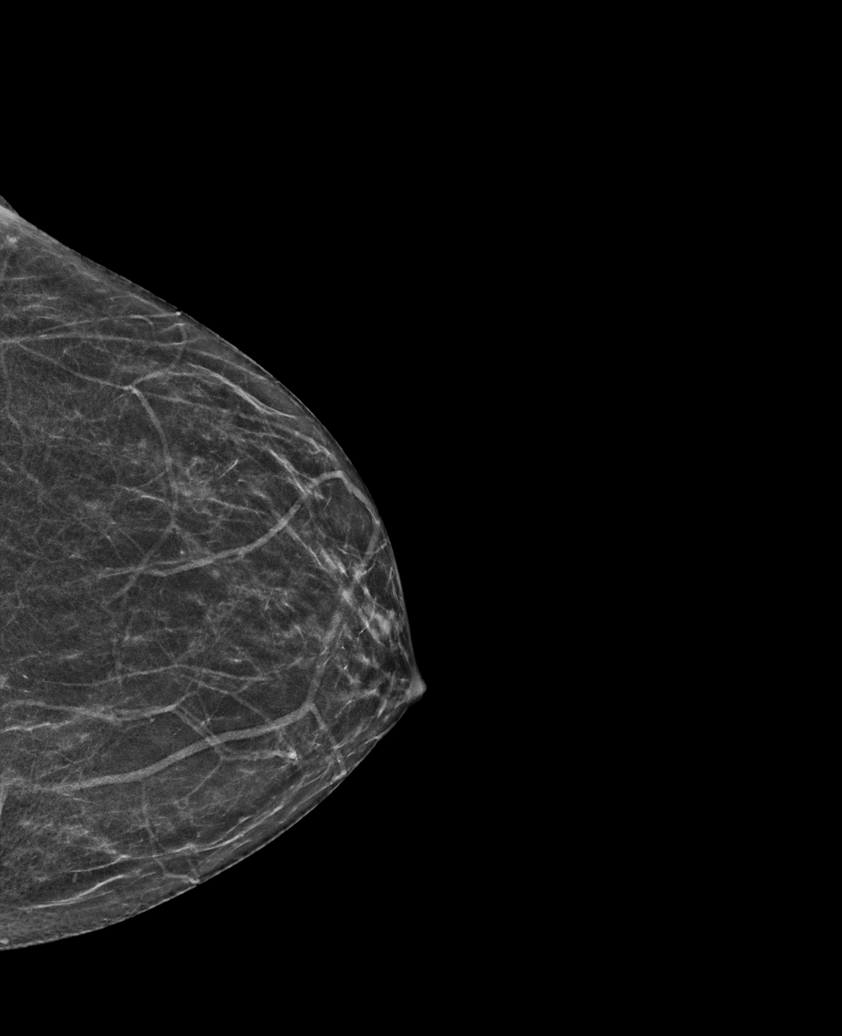

[R MLO synth-2D]
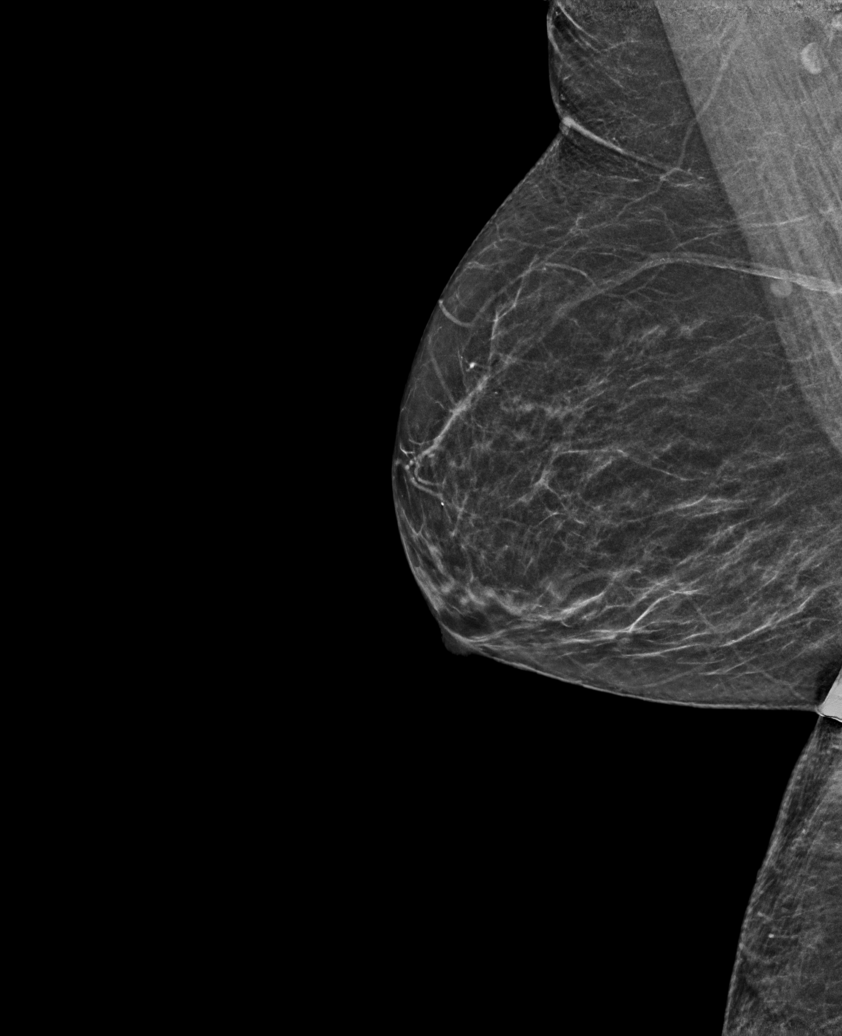

[L MLO synth-2D]
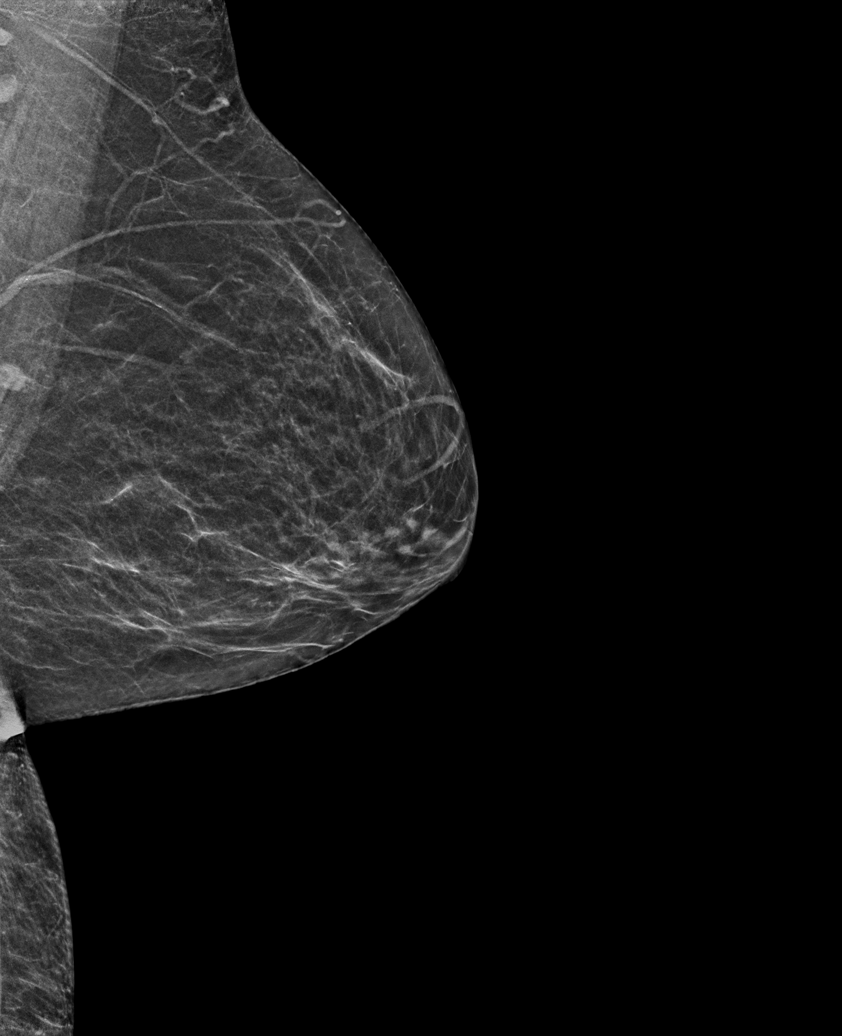

[L CV synth-2D]
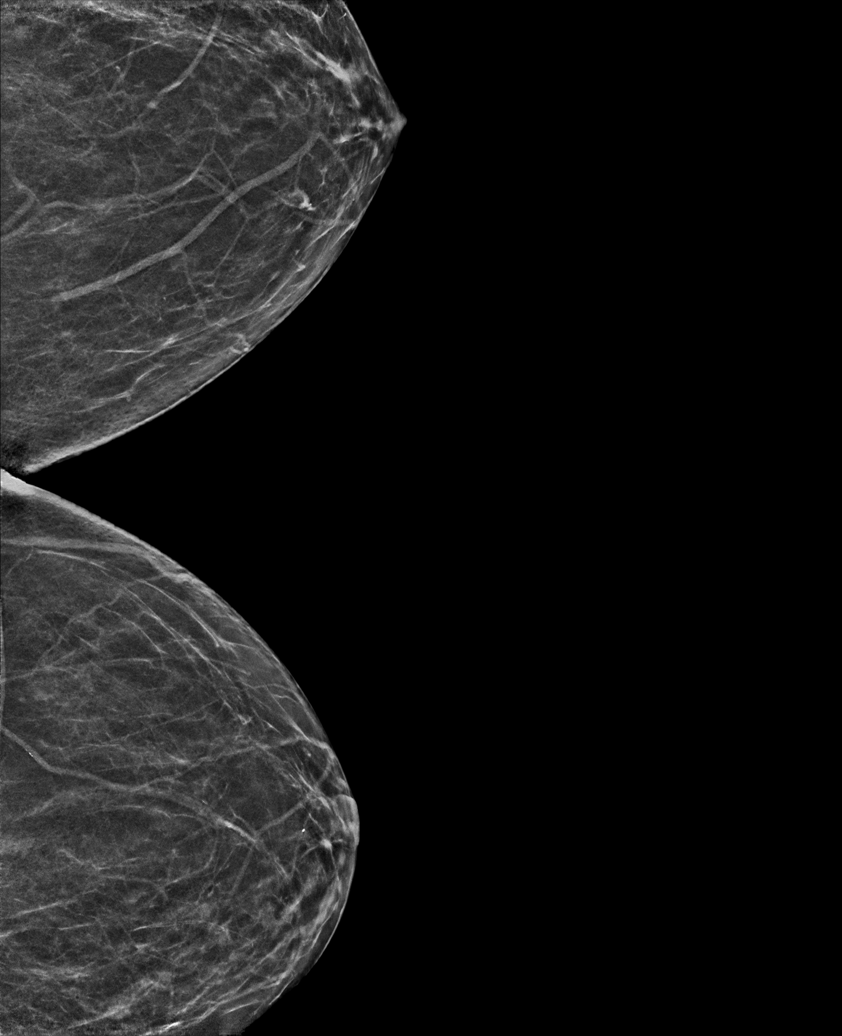

[R CC synth-2D]
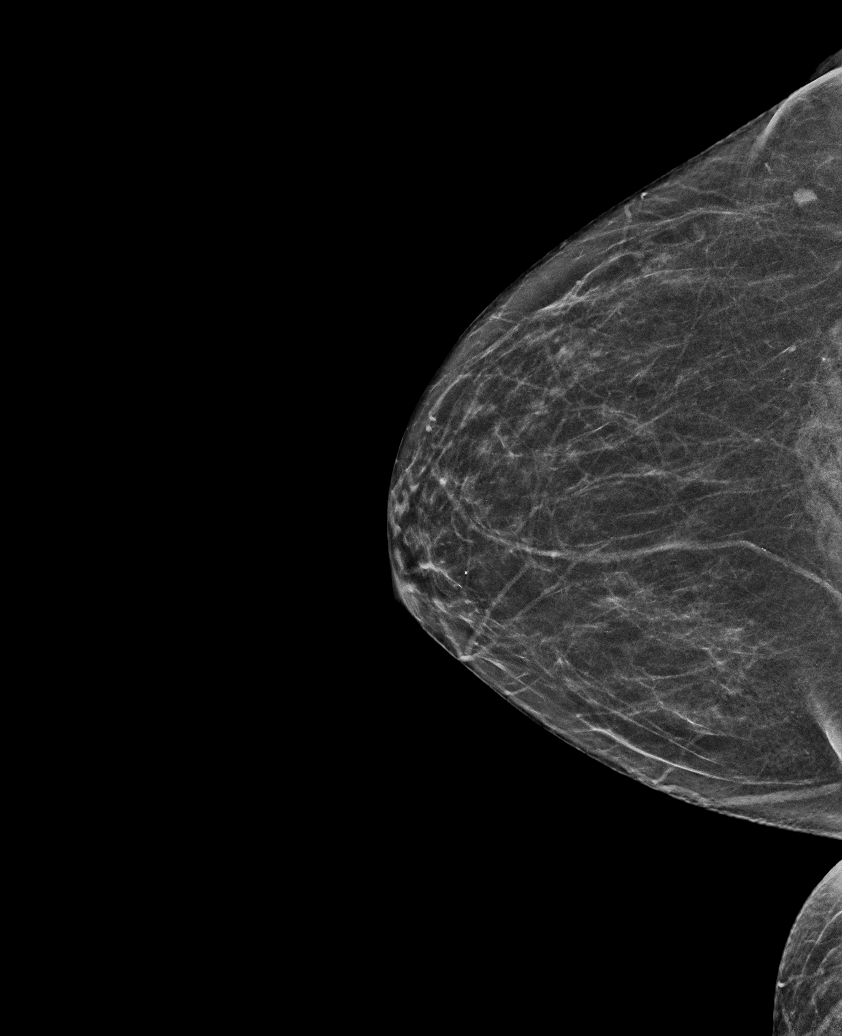

[L CC tomo · tomo slice 25/48.0]
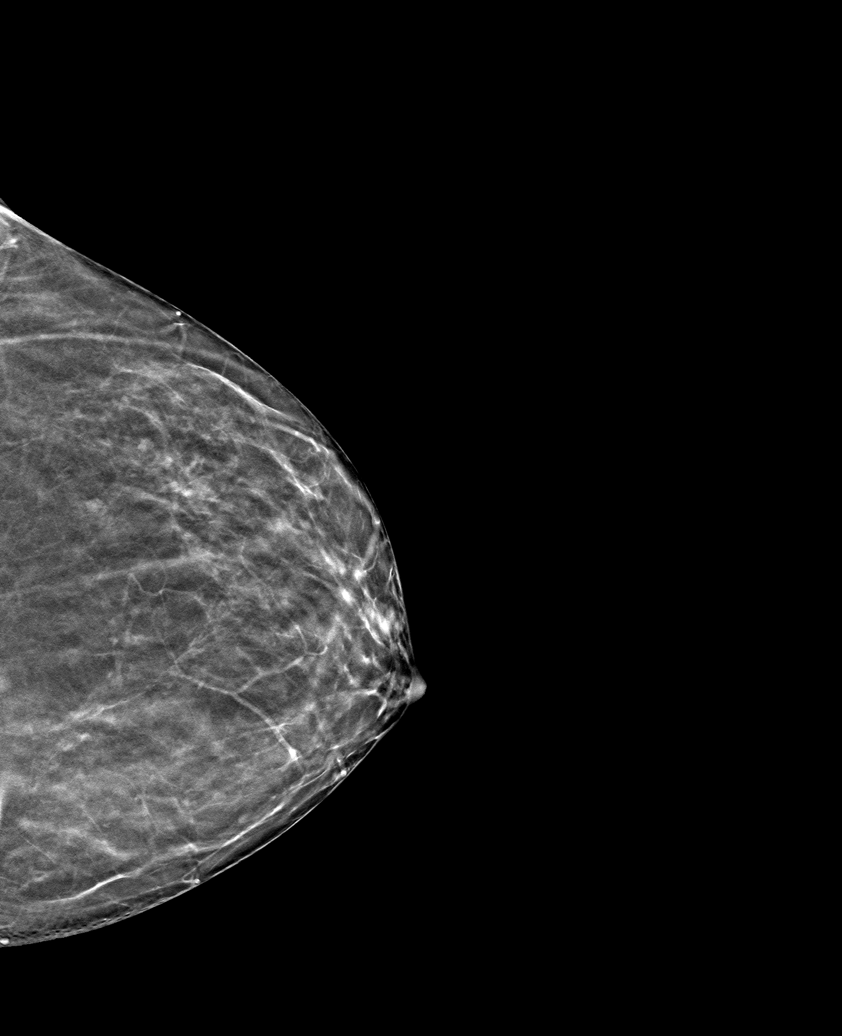

[6 of 30 positions shown; findings below may reference images not displayed]

ACR Breast Density Category b: There are scattered areas of
fibroglandular density.
FINDINGS: There are no findings suspicious for malignancy. Images were
processed with CAD.
IMPRESSION: No mammographic evidence of malignancy. A result letter of this
screening mammogram will be mailed directly to the patient.

RECOMMENDATION:
Screening mammogram in one year. (Code:[TQ])

BI-RADS CATEGORY  1: Negative.

## 2018-10-14 IMAGING — CR DG ABDOMEN 1V
1 series · 2 of 2 positions shown · non-contrast
Comparison: None.

CLINICAL DATA: Back pain.

EXAM:
ABDOMEN - 1 VIEW

[Series 1: dg abd 1 view · 0.14mm/px · 2 of 2 slices shown]
[im 1/2]
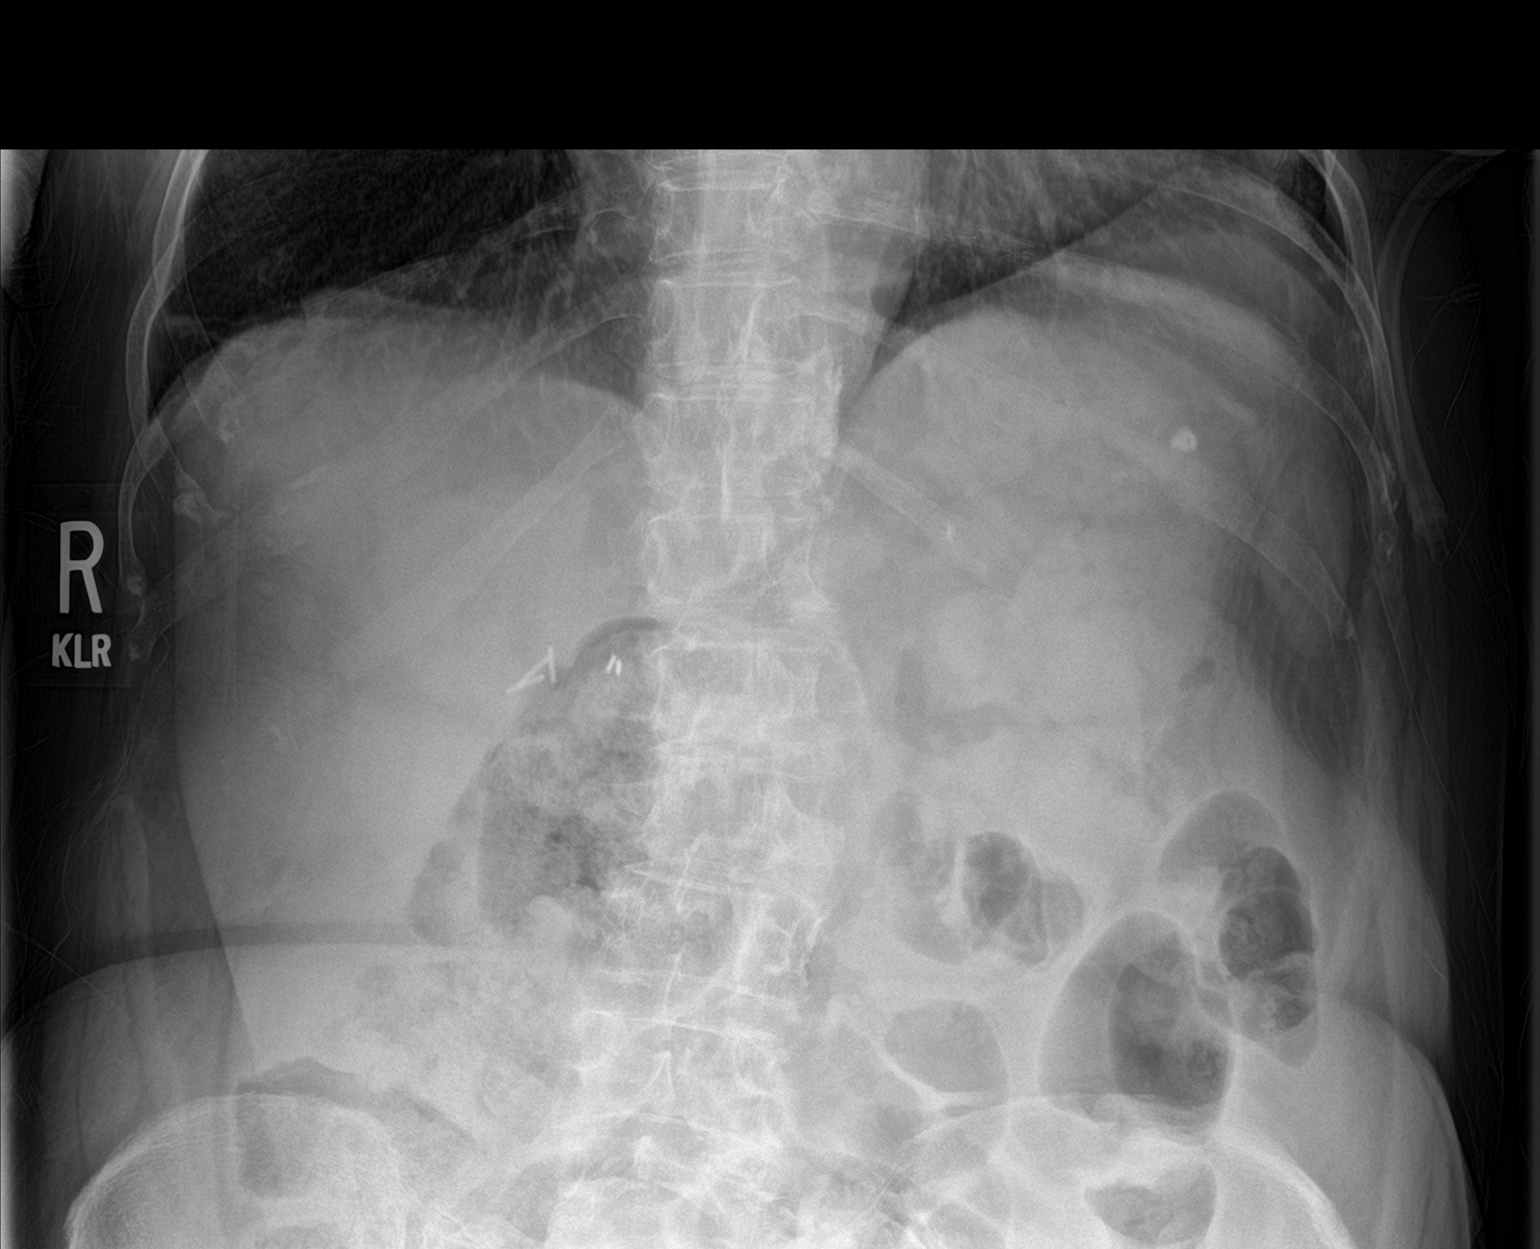
[im 2/2]
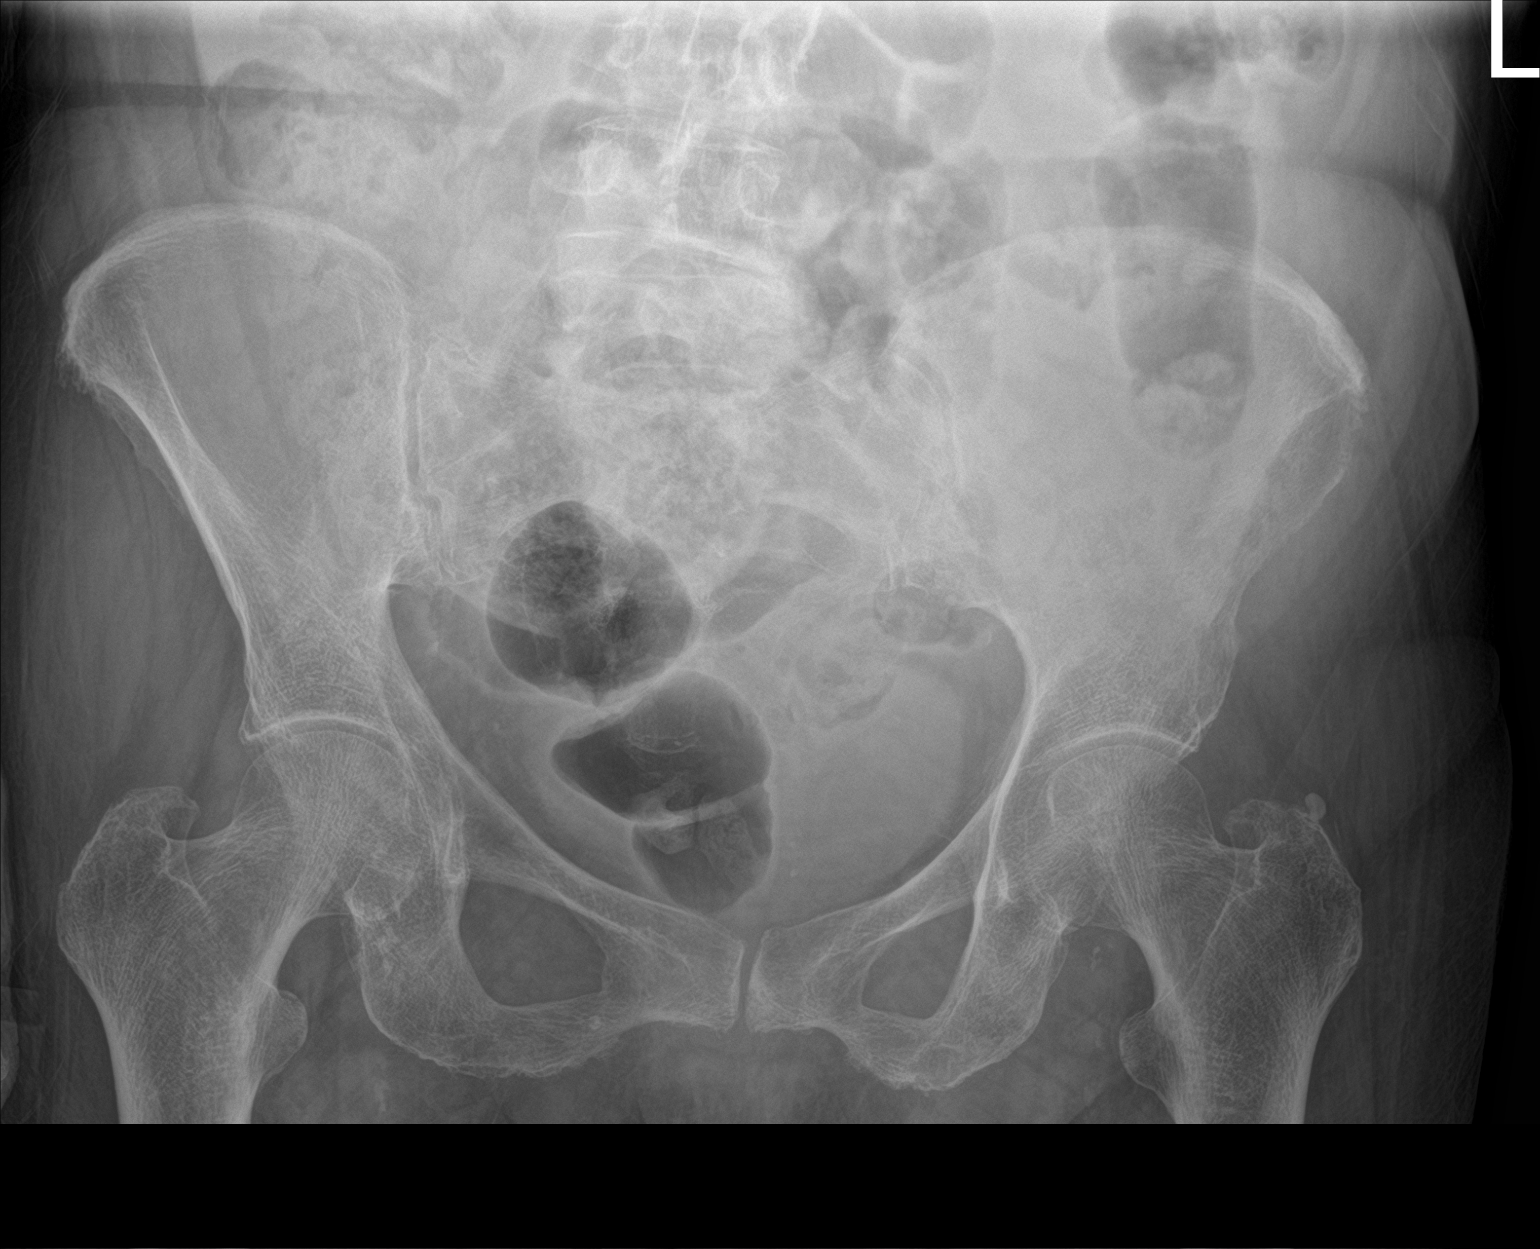

[2 of 2 positions shown; findings below may reference images not displayed]

FINDINGS: The bowel gas pattern is normal. No radio-opaque calculi or other
significant radiographic abnormality are seen.
IMPRESSION: Negative.

## 2019-04-11 ENCOUNTER — Other Ambulatory Visit: Payer: Self-pay | Admitting: Family Medicine

## 2019-04-11 DIAGNOSIS — Z1231 Encounter for screening mammogram for malignant neoplasm of breast: Secondary | ICD-10-CM

## 2019-06-27 ENCOUNTER — Ambulatory Visit
Admission: RE | Admit: 2019-06-27 | Discharge: 2019-06-27 | Disposition: A | Discharge status: Home / Self Care | Payer: Medicare Other | Source: Ambulatory Visit | Attending: Family Medicine | Admitting: Family Medicine

## 2019-06-27 DIAGNOSIS — Z1231 Encounter for screening mammogram for malignant neoplasm of breast: Secondary | ICD-10-CM | POA: Insufficient documentation

## 2019-06-27 IMAGING — MG DIGITAL SCREENING BILAT W/ TOMO W/ CAD
6 of 10 series · 6 of 30 positions shown · non-contrast
Comparison: Previous exam(s).

CLINICAL DATA: Screening.

EXAM:
DIGITAL SCREENING BILATERAL MAMMOGRAM WITH TOMO AND CAD

[R CC synth-2D (1 of 2)]
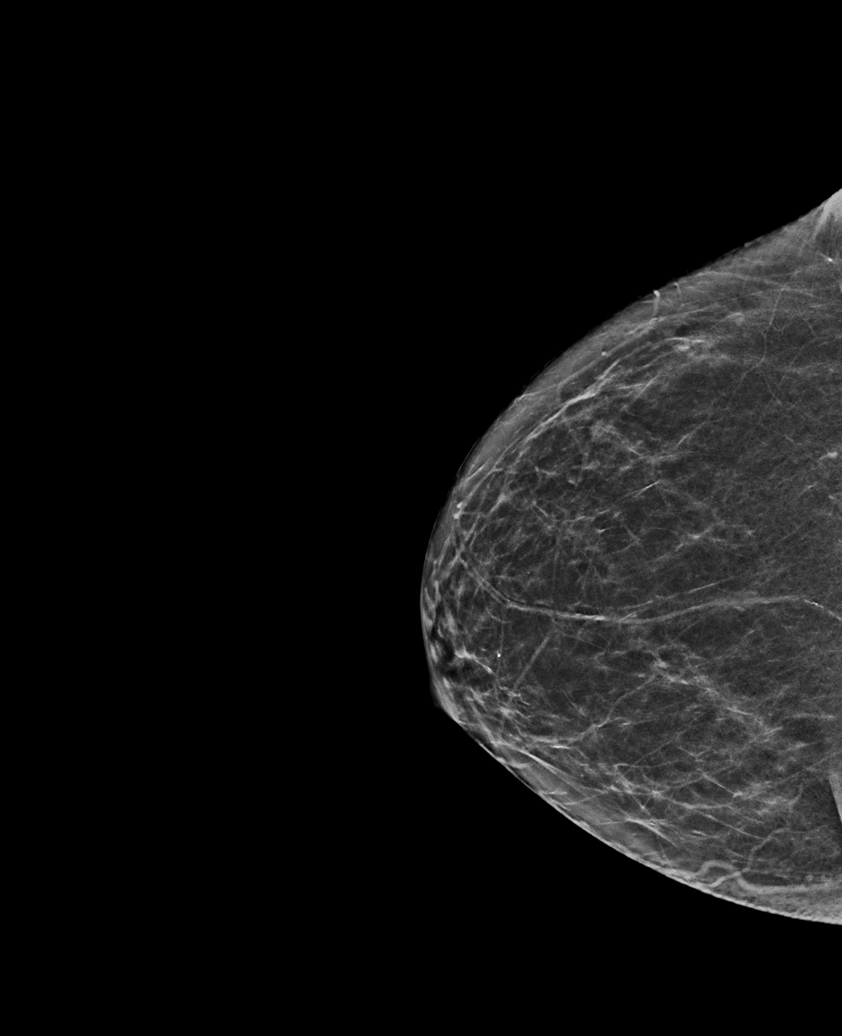

[R CC synth-2D (2 of 2)]
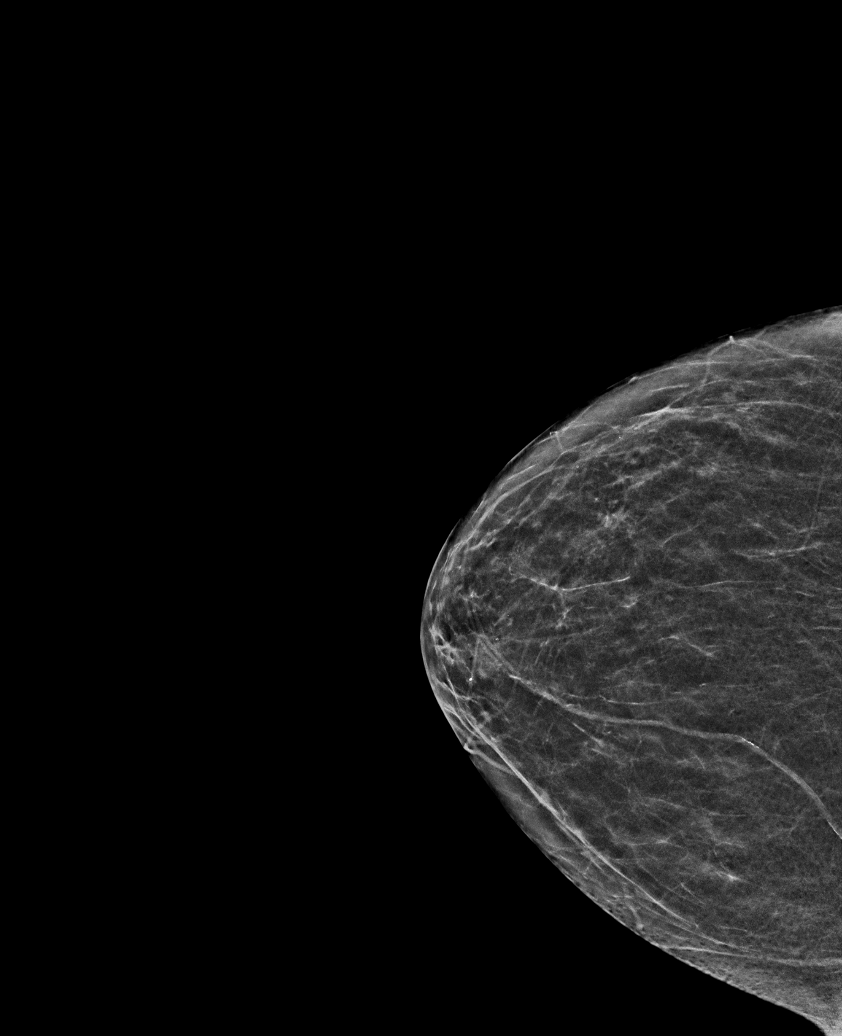

[L MLO synth-2D]
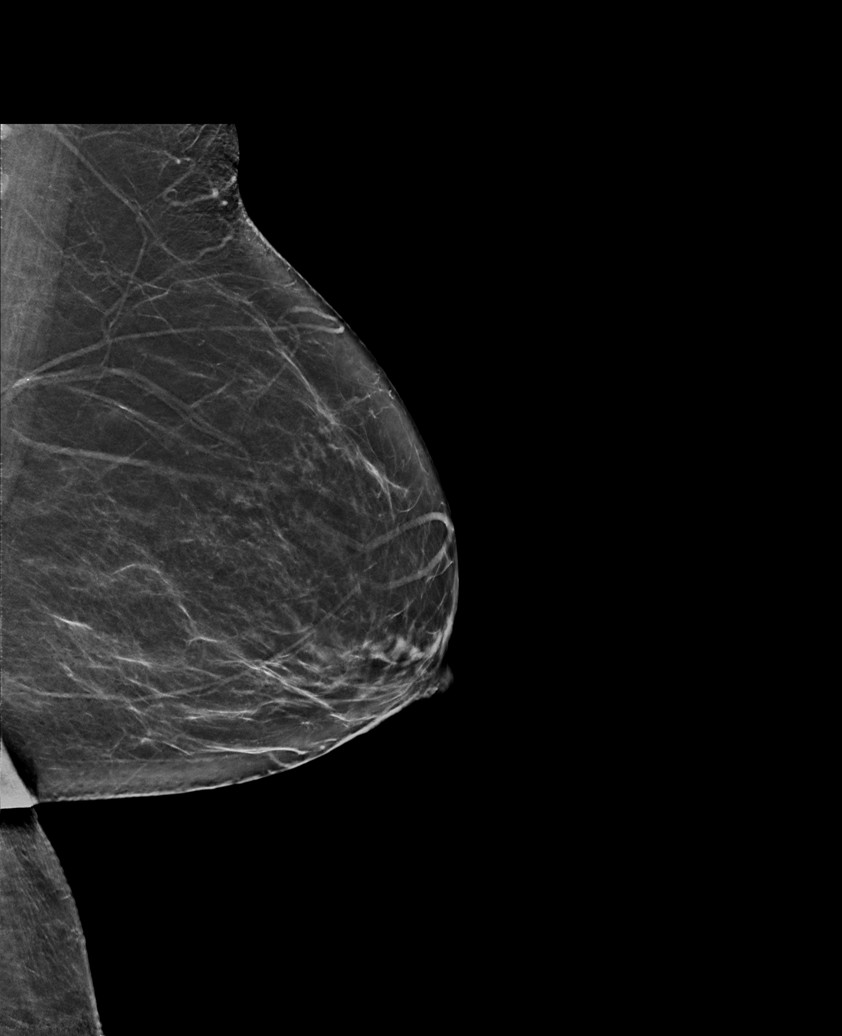

[L CC synth-2D]
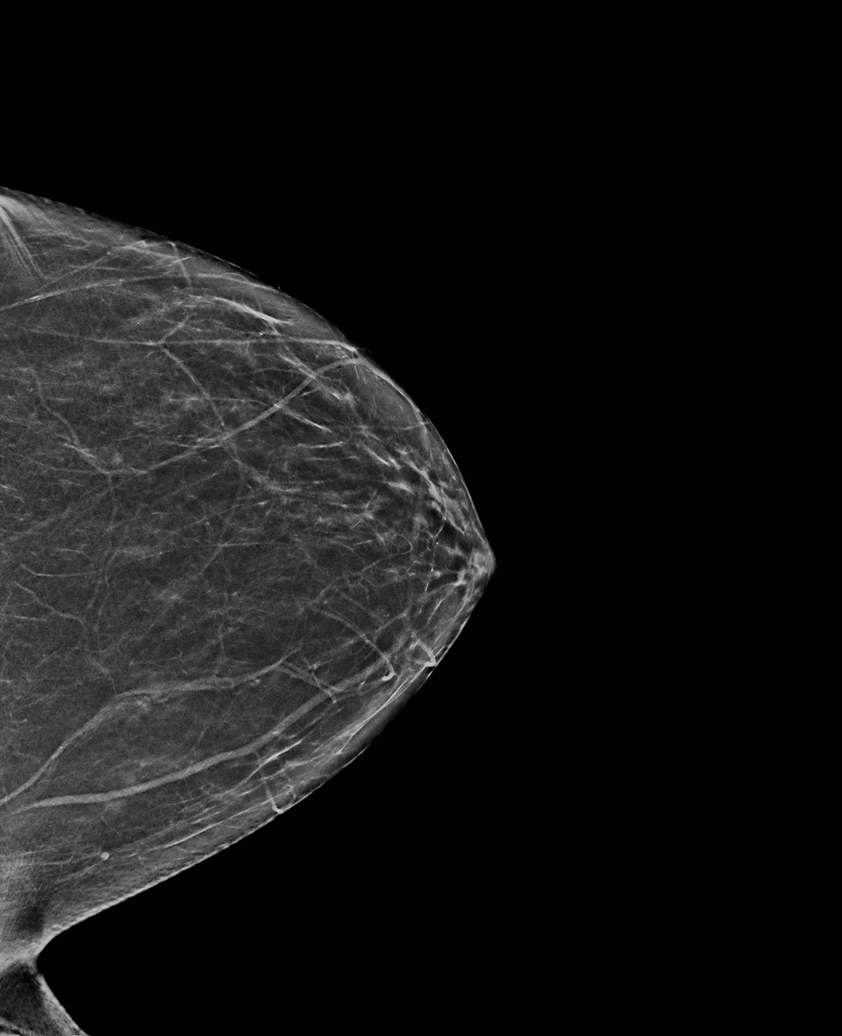

[R MLO synth-2D]
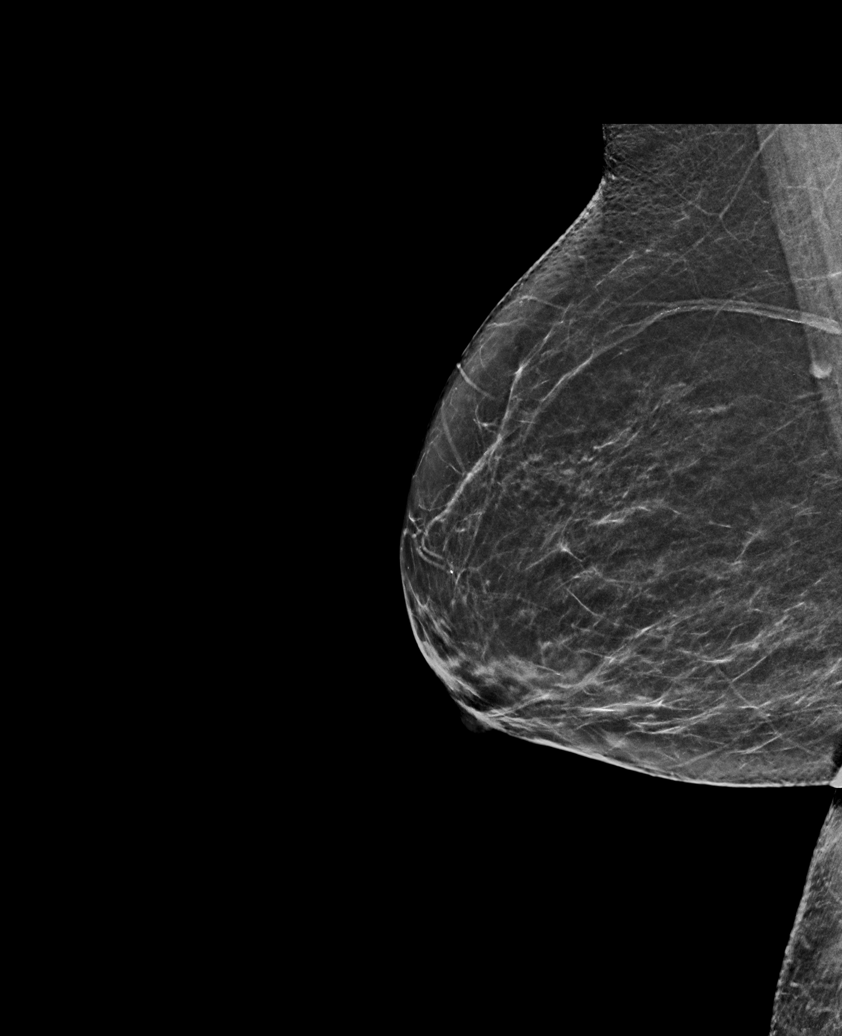

[R CC tomo · tomo slice 29/57.0]
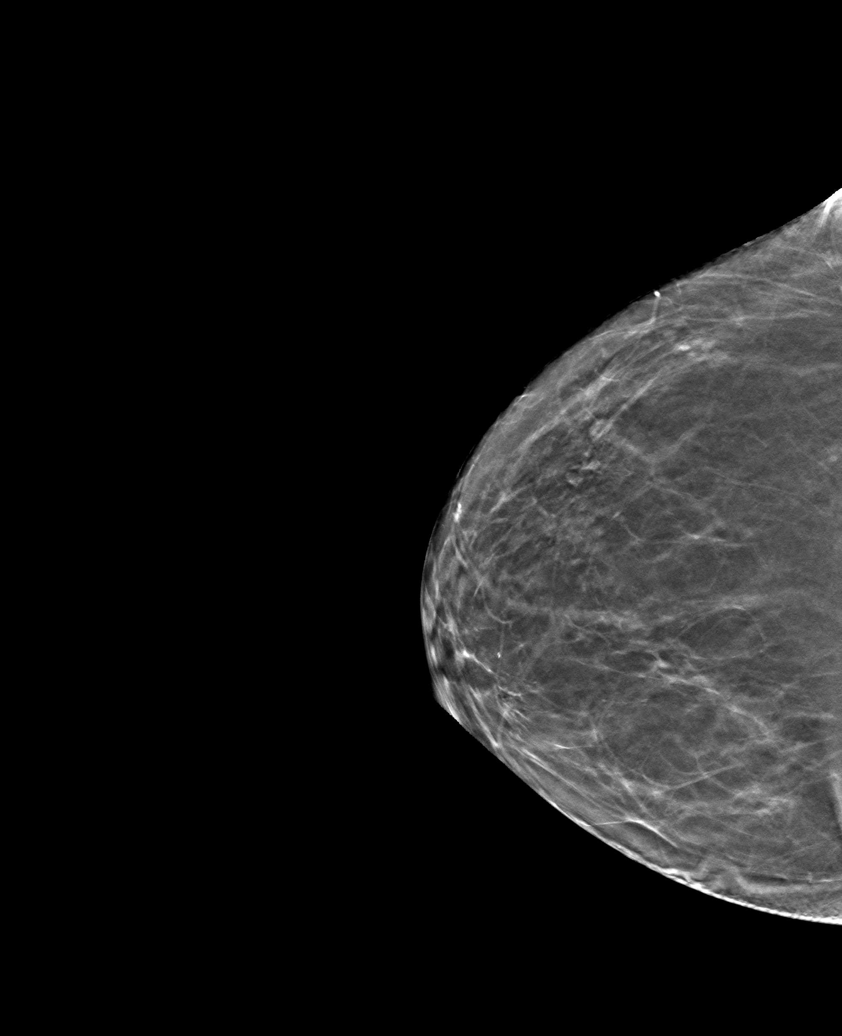

[6 of 30 positions shown; findings below may reference images not displayed]

ACR Breast Density Category b: There are scattered areas of
fibroglandular density.
FINDINGS: There are no findings suspicious for malignancy. Images were
processed with CAD.
IMPRESSION: No mammographic evidence of malignancy. A result letter of this
screening mammogram will be mailed directly to the patient.

RECOMMENDATION:
Screening mammogram in one year. (Code:[TQ])

BI-RADS CATEGORY  1: Negative.

## 2020-05-10 ENCOUNTER — Emergency Department: Payer: Medicare Other

## 2020-05-10 ENCOUNTER — Encounter: Payer: Self-pay | Admitting: Emergency Medicine

## 2020-05-10 ENCOUNTER — Inpatient Hospital Stay
Admission: EM | Admit: 2020-05-10 | Discharge: 2020-05-12 | DRG: 552 | Disposition: A | Discharge status: Home Health | Payer: Medicare Other | Attending: Internal Medicine | Admitting: Internal Medicine

## 2020-05-10 ENCOUNTER — Other Ambulatory Visit: Payer: Self-pay

## 2020-05-10 DIAGNOSIS — Z882 Allergy status to sulfonamides status: Secondary | ICD-10-CM

## 2020-05-10 DIAGNOSIS — I10 Essential (primary) hypertension: Secondary | ICD-10-CM

## 2020-05-10 DIAGNOSIS — E1140 Type 2 diabetes mellitus with diabetic neuropathy, unspecified: Secondary | ICD-10-CM | POA: Diagnosis present

## 2020-05-10 DIAGNOSIS — I714 Abdominal aortic aneurysm, without rupture, unspecified: Secondary | ICD-10-CM

## 2020-05-10 DIAGNOSIS — J449 Chronic obstructive pulmonary disease, unspecified: Secondary | ICD-10-CM | POA: Diagnosis present

## 2020-05-10 DIAGNOSIS — E1169 Type 2 diabetes mellitus with other specified complication: Secondary | ICD-10-CM | POA: Diagnosis present

## 2020-05-10 DIAGNOSIS — M546 Pain in thoracic spine: Secondary | ICD-10-CM

## 2020-05-10 DIAGNOSIS — F32A Depression, unspecified: Secondary | ICD-10-CM | POA: Diagnosis present

## 2020-05-10 DIAGNOSIS — G8929 Other chronic pain: Secondary | ICD-10-CM | POA: Diagnosis present

## 2020-05-10 DIAGNOSIS — R911 Solitary pulmonary nodule: Secondary | ICD-10-CM | POA: Diagnosis present

## 2020-05-10 DIAGNOSIS — I119 Hypertensive heart disease without heart failure: Secondary | ICD-10-CM | POA: Diagnosis present

## 2020-05-10 DIAGNOSIS — E785 Hyperlipidemia, unspecified: Secondary | ICD-10-CM | POA: Diagnosis present

## 2020-05-10 DIAGNOSIS — M549 Dorsalgia, unspecified: Secondary | ICD-10-CM | POA: Diagnosis not present

## 2020-05-10 DIAGNOSIS — E119 Type 2 diabetes mellitus without complications: Secondary | ICD-10-CM | POA: Diagnosis not present

## 2020-05-10 DIAGNOSIS — M47816 Spondylosis without myelopathy or radiculopathy, lumbar region: Secondary | ICD-10-CM | POA: Diagnosis not present

## 2020-05-10 DIAGNOSIS — Z720 Tobacco use: Secondary | ICD-10-CM | POA: Diagnosis present

## 2020-05-10 DIAGNOSIS — F418 Other specified anxiety disorders: Secondary | ICD-10-CM | POA: Diagnosis present

## 2020-05-10 DIAGNOSIS — F419 Anxiety disorder, unspecified: Secondary | ICD-10-CM | POA: Diagnosis present

## 2020-05-10 DIAGNOSIS — Z7984 Long term (current) use of oral hypoglycemic drugs: Secondary | ICD-10-CM

## 2020-05-10 DIAGNOSIS — Z20822 Contact with and (suspected) exposure to covid-19: Secondary | ICD-10-CM | POA: Diagnosis present

## 2020-05-10 DIAGNOSIS — E876 Hypokalemia: Secondary | ICD-10-CM | POA: Diagnosis present

## 2020-05-10 DIAGNOSIS — Z79899 Other long term (current) drug therapy: Secondary | ICD-10-CM

## 2020-05-10 DIAGNOSIS — D509 Iron deficiency anemia, unspecified: Secondary | ICD-10-CM | POA: Diagnosis present

## 2020-05-10 DIAGNOSIS — F17210 Nicotine dependence, cigarettes, uncomplicated: Secondary | ICD-10-CM | POA: Diagnosis present

## 2020-05-10 DIAGNOSIS — I712 Thoracic aortic aneurysm, without rupture: Secondary | ICD-10-CM | POA: Diagnosis present

## 2020-05-10 HISTORY — DX: Type 2 diabetes mellitus without complications: E11.9

## 2020-05-10 HISTORY — DX: Essential (primary) hypertension: I10

## 2020-05-10 LAB — CBC WITH DIFFERENTIAL/PLATELET
Abs Immature Granulocytes: 0.04 10*3/uL (ref 0.00–0.07)
Basophils Absolute: 0.1 10*3/uL (ref 0.0–0.1)
Basophils Relative: 1 %
Eosinophils Absolute: 0.3 10*3/uL (ref 0.0–0.5)
Eosinophils Relative: 4 %
HCT: 26.4 % — ABNORMAL LOW (ref 36.0–46.0)
Hemoglobin: 7.9 g/dL — ABNORMAL LOW (ref 12.0–15.0)
Immature Granulocytes: 1 %
Lymphocytes Relative: 21 %
Lymphs Abs: 1.8 10*3/uL (ref 0.7–4.0)
MCH: 22.5 pg — ABNORMAL LOW (ref 26.0–34.0)
MCHC: 29.9 g/dL — ABNORMAL LOW (ref 30.0–36.0)
MCV: 75.2 fL — ABNORMAL LOW (ref 80.0–100.0)
Monocytes Absolute: 0.6 10*3/uL (ref 0.1–1.0)
Monocytes Relative: 6 %
Neutro Abs: 5.9 10*3/uL (ref 1.7–7.7)
Neutrophils Relative %: 67 %
Platelets: 341 10*3/uL (ref 150–400)
RBC: 3.51 MIL/uL — ABNORMAL LOW (ref 3.87–5.11)
RDW: 17.5 % — ABNORMAL HIGH (ref 11.5–15.5)
WBC: 8.7 10*3/uL (ref 4.0–10.5)
nRBC: 0 % (ref 0.0–0.2)

## 2020-05-10 LAB — PROTIME-INR
INR: 1 (ref 0.8–1.2)
Prothrombin Time: 12.6 seconds (ref 11.4–15.2)

## 2020-05-10 LAB — RESP PANEL BY RT-PCR (FLU A&B, COVID) ARPGX2
Influenza A by PCR: NEGATIVE
Influenza B by PCR: NEGATIVE
SARS Coronavirus 2 by RT PCR: NEGATIVE

## 2020-05-10 LAB — URINALYSIS, ROUTINE W REFLEX MICROSCOPIC
Bilirubin Urine: NEGATIVE
Glucose, UA: NEGATIVE mg/dL
Hgb urine dipstick: NEGATIVE
Ketones, ur: NEGATIVE mg/dL
Leukocytes,Ua: NEGATIVE
Nitrite: NEGATIVE
Protein, ur: NEGATIVE mg/dL
Specific Gravity, Urine: 1.013 (ref 1.005–1.030)
pH: 6 (ref 5.0–8.0)

## 2020-05-10 LAB — RETICULOCYTES
Immature Retic Fract: 28.5 % — ABNORMAL HIGH (ref 2.3–15.9)
RBC.: 3.5 MIL/uL — ABNORMAL LOW (ref 3.87–5.11)
Retic Count, Absolute: 68.6 10*3/uL (ref 19.0–186.0)
Retic Ct Pct: 2 % (ref 0.4–3.1)

## 2020-05-10 LAB — COMPREHENSIVE METABOLIC PANEL
ALT: 16 U/L (ref 0–44)
AST: 25 U/L (ref 15–41)
Albumin: 3.4 g/dL — ABNORMAL LOW (ref 3.5–5.0)
Alkaline Phosphatase: 125 U/L (ref 38–126)
Anion gap: 11 (ref 5–15)
BUN: 12 mg/dL (ref 8–23)
CO2: 25 mmol/L (ref 22–32)
Calcium: 8.7 mg/dL — ABNORMAL LOW (ref 8.9–10.3)
Chloride: 98 mmol/L (ref 98–111)
Creatinine, Ser: 0.83 mg/dL (ref 0.44–1.00)
GFR, Estimated: >60 mL/min (ref >60)
Glucose, Bld: 117 mg/dL — ABNORMAL HIGH (ref 70–99)
Potassium: 3.9 mmol/L (ref 3.5–5.1)
Sodium: 134 mmol/L — ABNORMAL LOW (ref 135–145)
Total Bilirubin: 0.5 mg/dL (ref 0.3–1.2)
Total Protein: 6.8 g/dL (ref 6.5–8.1)

## 2020-05-10 LAB — TROPONIN I (HIGH SENSITIVITY): Troponin I (High Sensitivity): 5 ng/L (ref <18)

## 2020-05-10 LAB — HEMOGLOBIN AND HEMATOCRIT, BLOOD
HCT: 26.2 % — ABNORMAL LOW (ref 36.0–46.0)
Hemoglobin: 7.8 g/dL — ABNORMAL LOW (ref 12.0–15.0)

## 2020-05-10 LAB — TYPE AND SCREEN
ABO/RH(D): O POS
Antibody Screen: NEGATIVE

## 2020-05-10 LAB — APTT: aPTT: 29 seconds (ref 24–36)

## 2020-05-10 IMAGING — CT CT L SPINE W/O CM
2 of 3 series · 10 of 33 positions shown, 12 images · IV contrast (APPLIED)
Comparison: X-ray lumbar spine [DATE].

CLINICAL DATA: Chest pain and back pain, aortic dissection
suspected.

Lower back pain worse on the right.
Current everyday smoker.
EXAM:
CT ANGIOGRAPHY CHEST, ABDOMEN AND PELVIS
CT LUMBAR SPINE WITHOUT CONTRAST
TECHNIQUE: Non-contrast CT of the chest was initially obtained.

[Series 3: l spine soft · axial · 0.21mm/px · z∈[+155,+332]mm · 7 of 104 slices shown, 9 images]
[im 8/104  soft-tissue]
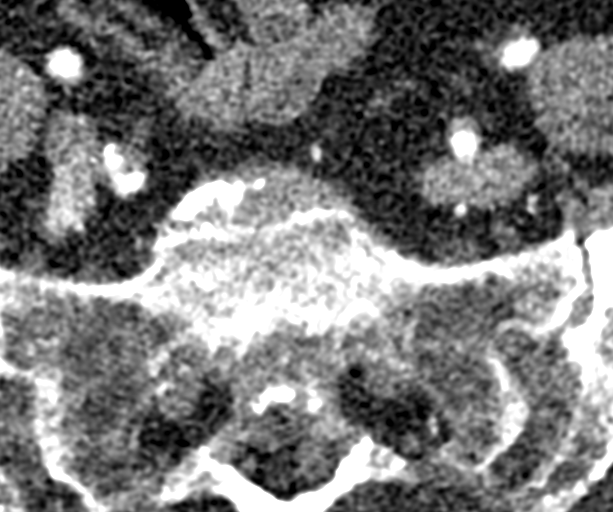
[im 8/104  bone]
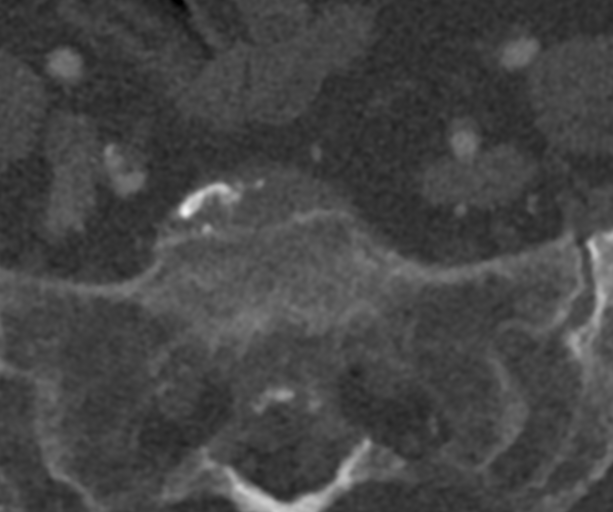
[im 24/104  bone]
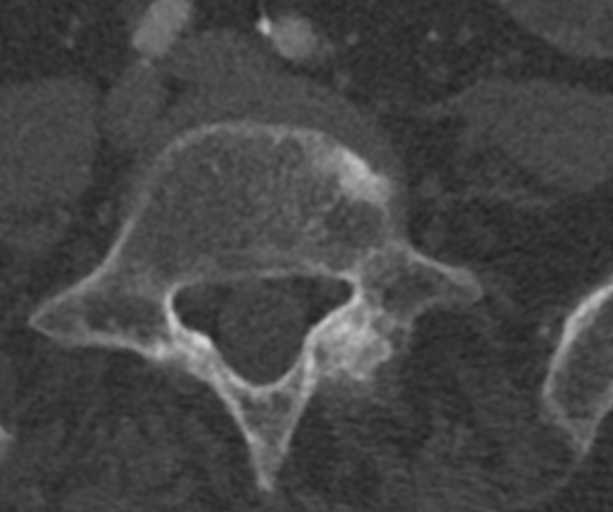
[im 40/104  bone]
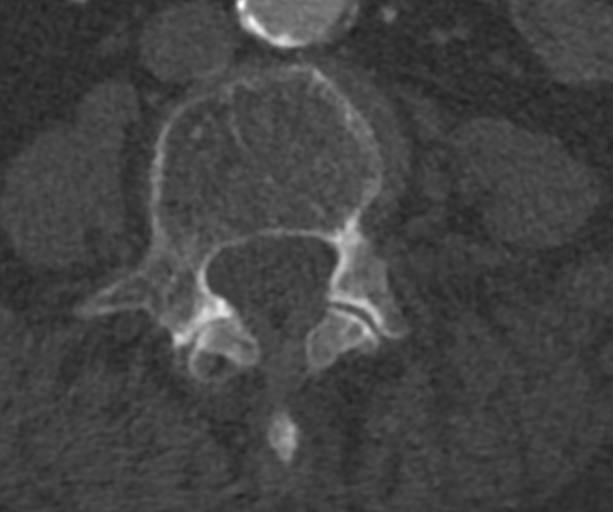
[im 56/104  bone]
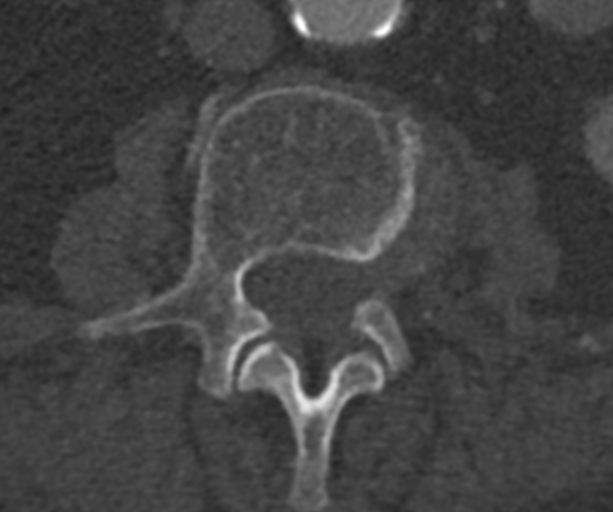
[im 64/104  soft-tissue]
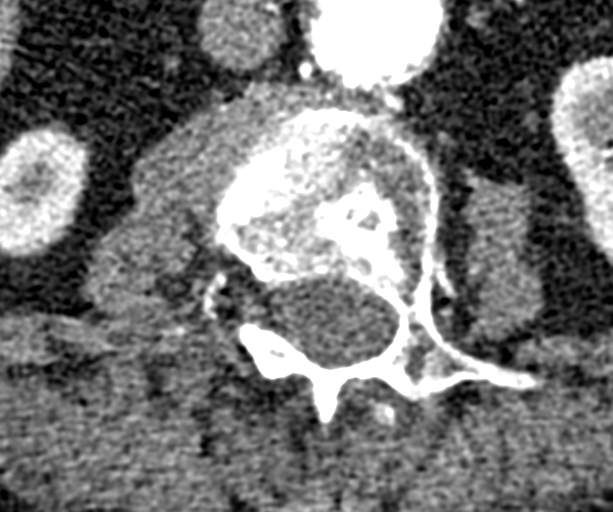
[im 64/104  bone]
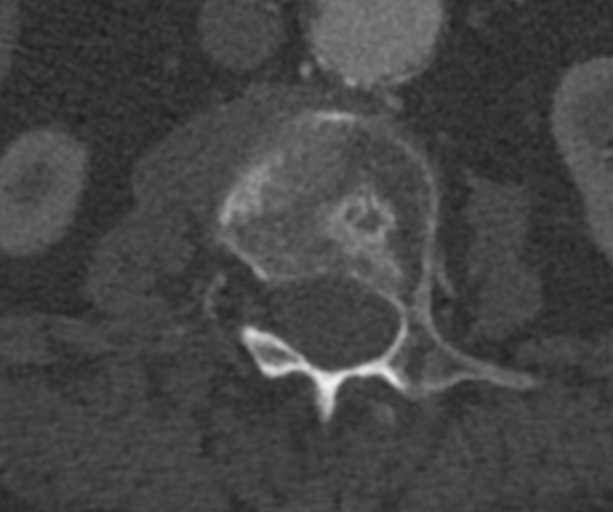
[im 80/104  bone]
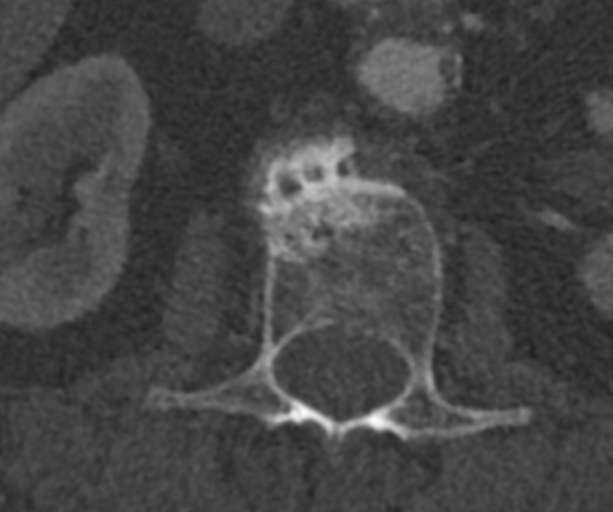
[im 96/104  bone]
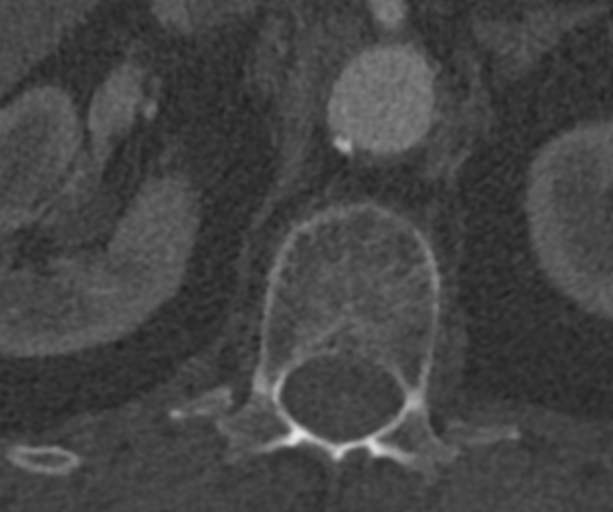

[Series 7: l spine coronal · coronal · 0.27mm/px · 3 of 66 slices shown]
[im 14/66  bone]
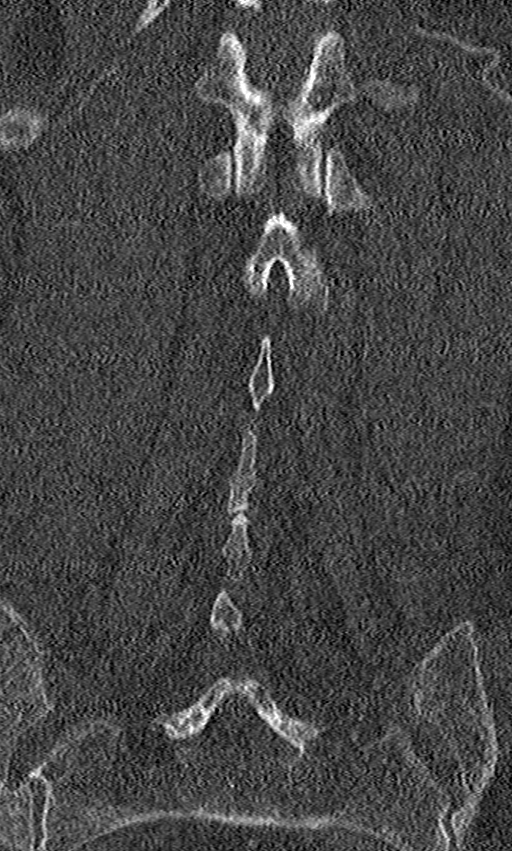
[im 27/66  bone]
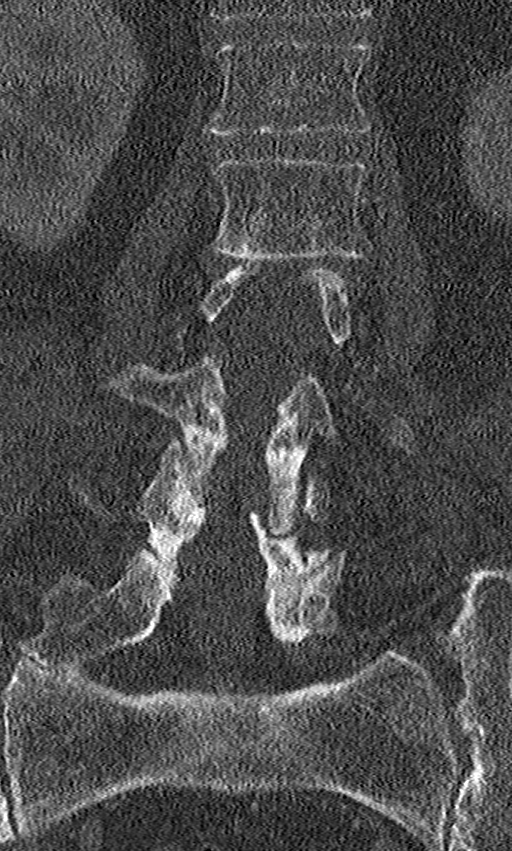
[im 40/66  bone]
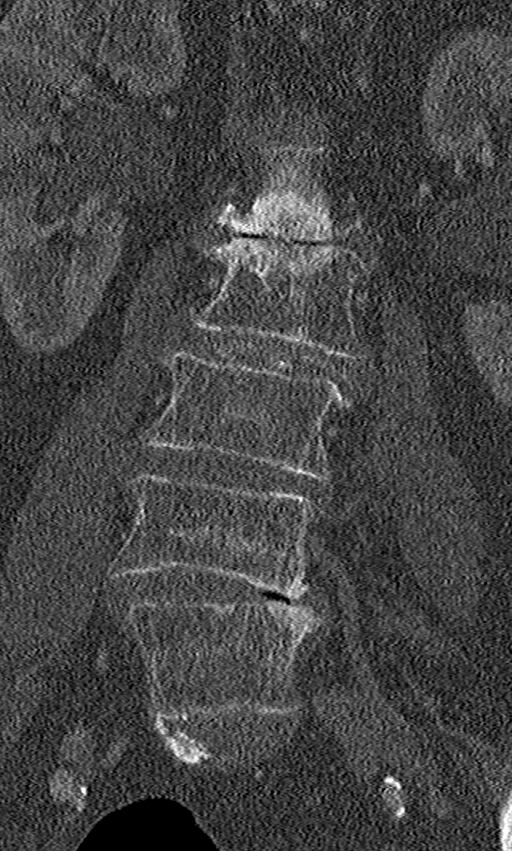

[10 of 33 positions shown; findings below may reference images not displayed]

Multidetector CT imaging through the chest, abdomen and pelvis was
performed using the standard protocol during bolus administration of
intravenous contrast. Multiplanar reconstructed images and MIPs were
obtained and reviewed to evaluate the vascular anatomy.

Multidetector CT imaging of the lumbar spine was performed without
intravenous contrast administration. Multiplanar CT image
reconstructions were also generated.

CONTRAST:  100mL OMNIPAQUE IOHEXOL 350 MG/ML SOLN
FINDINGS: VASCULAR

Aorta:

Aortic root calcifications noted. Extensive calcified and
noncalcified atherosclerotic. There is greater than 50% narrowing of
the descending thoracic aorta due to noncalcified plaque ([DATE], 68)
and similarly of the infrarenal abdominal aorta (5: 108).

The ascending aorta is normal in caliber. There is aneurysmal
dilatation of the distal descending thoracic aorta which measures up
to 4 cm in caliber ([DATE]) and extends approximately 5 cm in the
craniocaudal dimension. No associated periaortic fat stranding. No
dissection or vasculitis of the thoracic aorta. No aneurysm,
dissection, vasculitis of the abdominal aorta.

Coronary arteries: Least mild four-vessel coronary artery
calcifications.

Pulmonary artery: The main pulmonary artery is normal in caliber. No
central pulmonary embolus.

Celiac: Mild atherosclerotic plaque. Patent without evidence of
aneurysm, dissection, vasculitis or significant stenosis.

SMA: Mild atherosclerotic plaque. Patent without evidence of
aneurysm, dissection, vasculitis or significant stenosis.

Renals: Total of 2 left renal arteries. A single right renal artery
noted. Mild atherosclerotic plaque. Patent without evidence of
aneurysm, dissection, vasculitis or significant stenosis.

IMA: Mild atherosclerotic plaque. Patent without evidence of
aneurysm, dissection, vasculitis or significant stenosis.

Inflow: Moderate atherosclerotic plaque. Patent without evidence of
aneurysm, dissection, vasculitis or significant stenosis.

Veins: No obvious venous abnormality within the limitations of this
arterial phase study.

NON-VASCULAR

CTA CHEST FINDINGS

Cardiovascular: Preferential opacification of the thoracic aorta.
Normal heart size. Suggestion of mild mitral annular calcifications.
No significant pericardial effusion.

Mediastinum/Nodes: There is an enlarged 1 cm right hilar lymph node
([DATE]). No left hilar lymphadenopathy. There is an enlarged
precarinal lymph node measuring up to 1 cm ([DATE]). No axillary lymph
nodes. Thyroid gland, trachea, and esophagus demonstrate no
significant findings. Tiny hiatal hernia.

Lungs/Pleura: Biapical pleural/pulmonary scarring. There is a
spiculated 1.8 x 1.3 x 1.6 cm right upper lobe pulmonary nodule
([DATE]) with associated tethering of minor fissure. Right upper lobe
subpleural micronodule ([DATE]). A 0.5 cm nodular like subpleural
densities noted within the right apex ([DATE], [DATE]). No pulmonary
mass. No focal consolidation. Bilateral lower lobe subsegmental
atelectasis. Biapical pleural/pulmonary scarring. No pleural
effusion. No pneumothorax.

Musculoskeletal:

No chest wall abnormality

No suspicious lytic or blastic osseous lesions. No acute displaced
rib fracture. Chronic appearing compression fracture of the T6 and
T7 vertebral bodies. Multilevel degenerative changes of the spine.

CTA ABDOMEN AND PELVIS FINDINGS

Hepatobiliary: The liver is enlarged measuring up to 20 cm. No focal
liver abnormality is seen. Status post cholecystectomy. No biliary
dilatation.

Pancreas: No focal lesion. Normal pancreatic contour. No surrounding
inflammatory changes. No main pancreatic ductal dilatation.

Spleen: Heterogeneous appearance of the spleen consistent with
arterial phase. Normal in size without focal abnormality.

Adrenals/Urinary Tract: No adrenal nodule bilaterally. Bilateral
kidneys enhance symmetrically. No hydronephrosis. No hydroureter.
The urinary bladder is unremarkable.

Stomach/Bowel: Stomach is within normal limits. No evidence of bowel
wall thickening or dilatation. Sigmoid diverticulosis. The appendix
is not definitely identified.

Lymphatic: No lymphadenopathy.

Reproductive: Status post hysterectomy. No adnexal masses.

Other: No intraperitoneal free fluid. No intraperitoneal free gas.
No organized fluid collection.

Musculoskeletal:

No abdominal wall hernia or abnormality.

No suspicious lytic or blastic osseous lesions. No acute displaced
fracture of the pelvis or sacrum. Multilevel degenerative changes of
the spine.

CT LUMPAR SPINE:

Segmentation: 6 non-rib-bearing lumbar vertebral bodies. To keep
with nomenclature for prior x-ray, lower most vertebra is numbered
S1.

Alignment: Similar-appearing grade 1 anterolisthesis of L5 on S1.
Slight dextrocurvature of the lower lumbar spine.

Vertebra/Intervertebral disc space: Similar-appearing severe
degenerative changes at L2-L3 level and S1 level with intervertebral
disc space vacuum phenomenon and endplate sclerosis. Facet
arthropathy noted at the L5-S1 level. No severe neural foraminal
stenosis. No severe central canal stenosis.

Paraspinal and other soft tissues: Negative.

Review of the MIP images confirms the above findings.
IMPRESSION: VASCULAR:

1. Aneurysmal dilatation of the distal descending thoracic aorta
(measuring up to 4 cm). No CT findings to suggest imminent rupture.
2. Extensive calcified and noncalcified atherosclerotic plaque of
the thoracic and abdominal aorta with area of atherosclerotic plaque
leading to greater than 50% narrowing of the lumen. Aortic
Atherosclerosis ([TT]-[TT]).

NONVASCULAR:

1. A 1.8 cm right upper lobe pulmonary nodule. Indeterminate right
hilar and precarinal borderline enlarged lymphadenopathy. Findings
concerning for malignancy. Additional imaging evaluation or
consultation with Pulmonology or Thoracic Surgery recommended.
2. Hepatomegaly.
3. Sigmoid diverticulosis no acute diverticulitis.

LUMPAR SPINE:

1. No acute displaced fracture or traumatic listhesis of the lumbar
spine.

## 2020-05-10 IMAGING — CR DG CHEST 2V
1 series · 2 of 2 positions shown · non-contrast
Comparison: None.

CLINICAL DATA: Back pain

EXAM:
CHEST - 2 VIEW

[Series 1: dg chest 2 view · 0.14mm/px · 2 of 2 slices shown]
[im 1/2]
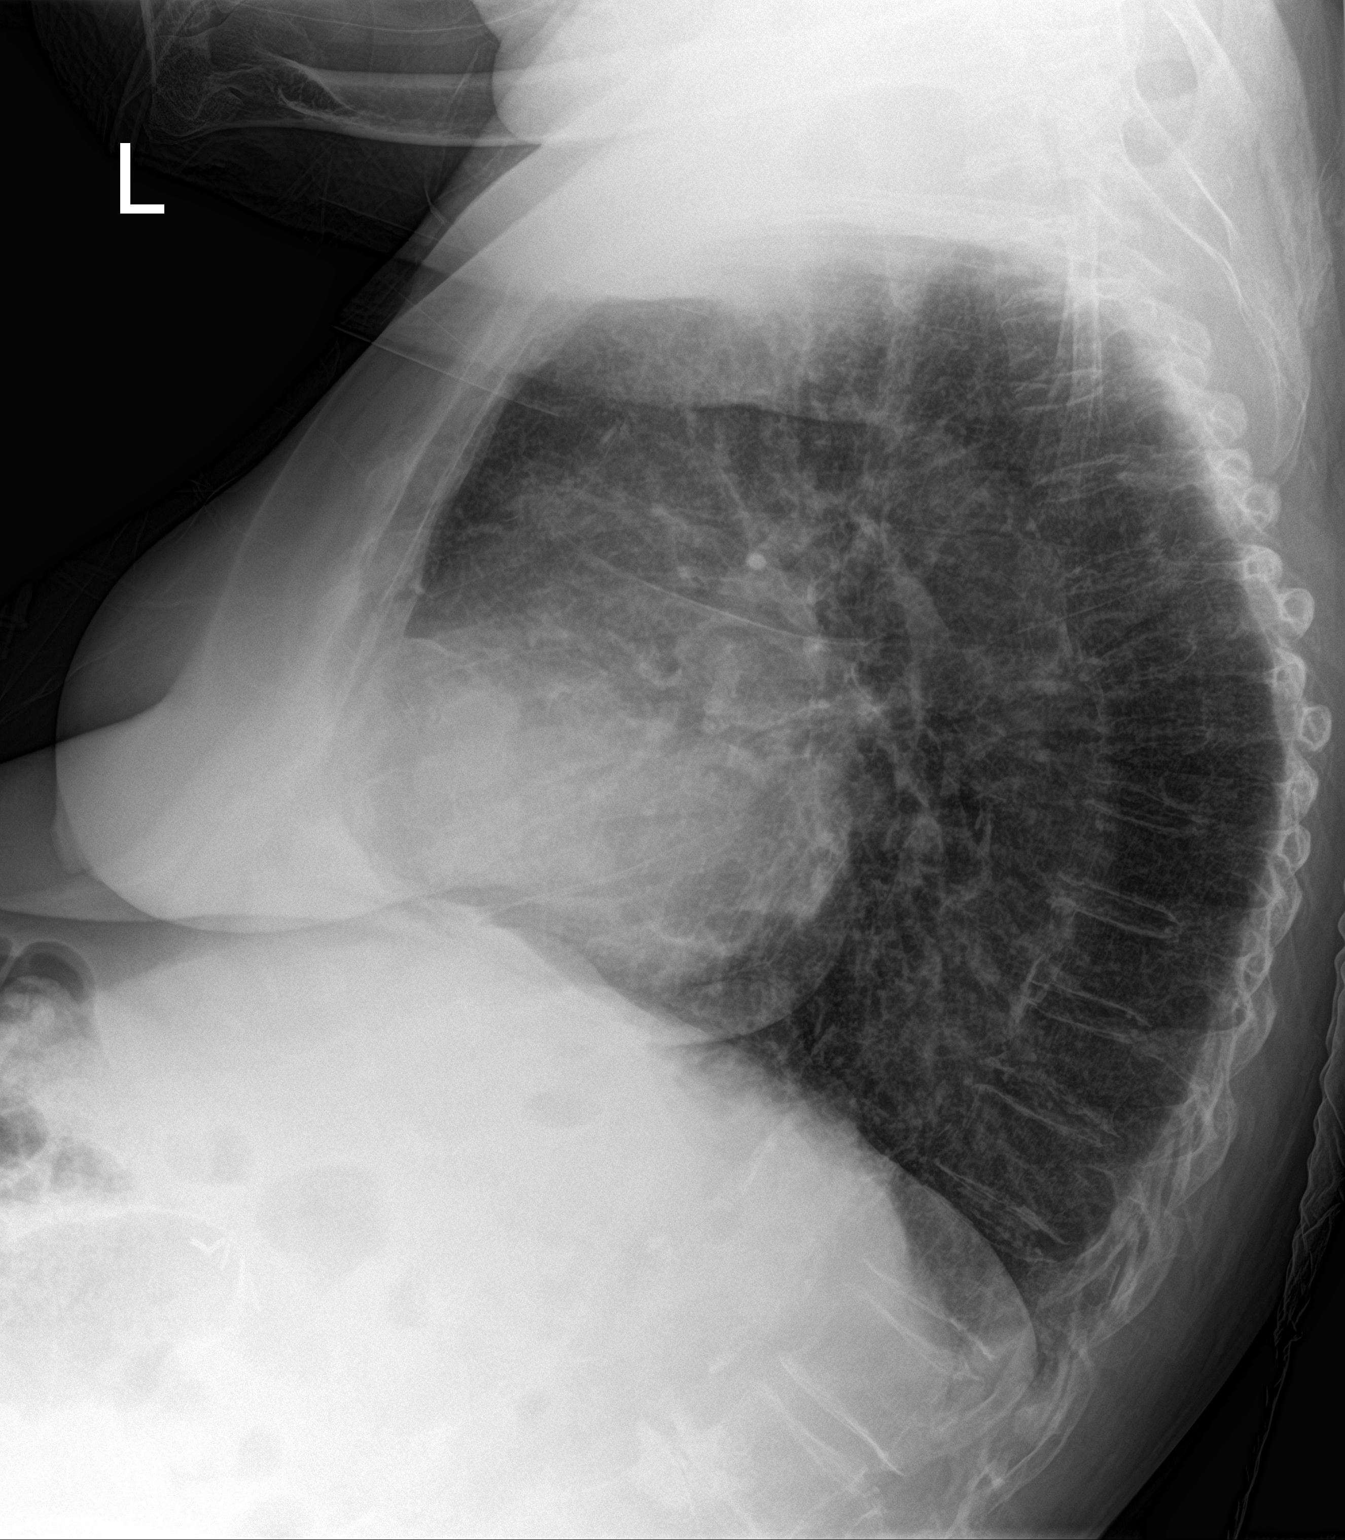
[im 2/2]
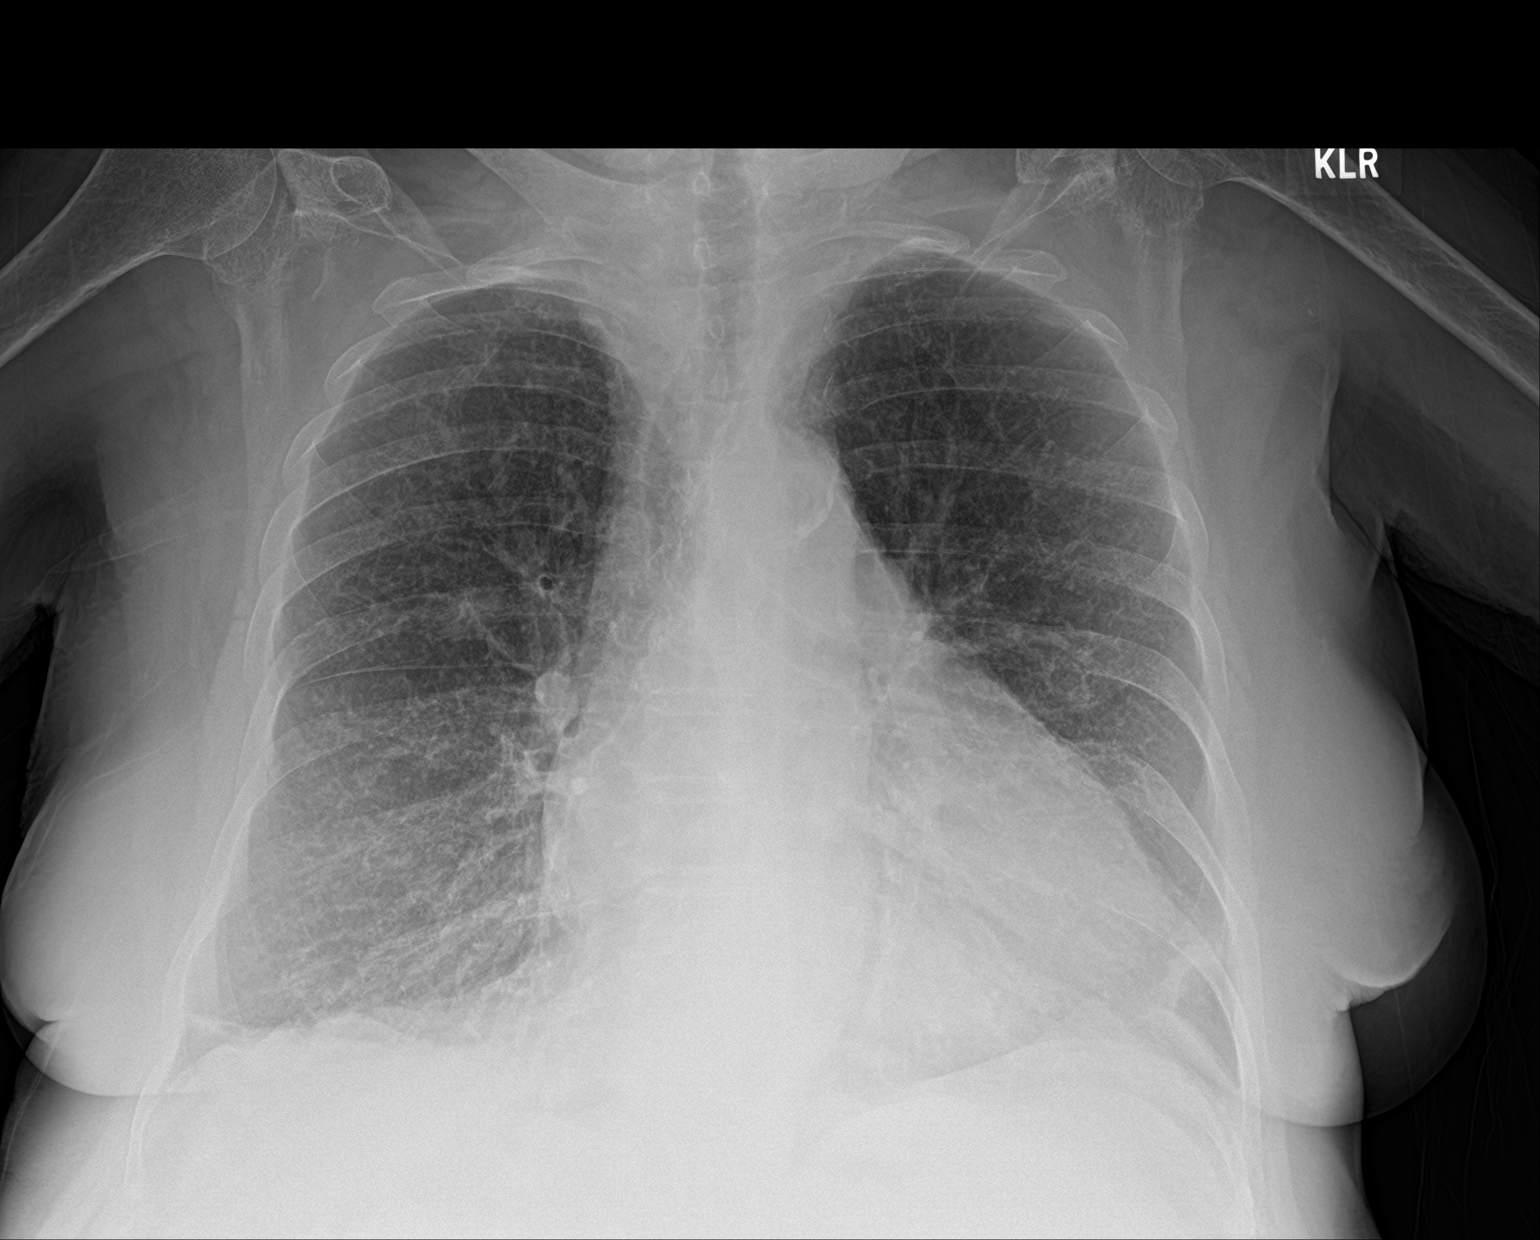

[2 of 2 positions shown; findings below may reference images not displayed]

FINDINGS: Cardiomegaly. Atherosclerotic calcification of the aortic knob.
Coarsened interstitial markings throughout both lungs. Minimal
linear opacity in the periphery of the right lung base. No pleural
effusion or pneumothorax. Osseous structures appear mildly
demineralized. No acute findings.
IMPRESSION: 1. Minimal linear opacity in the periphery of the right lung base,
likely atelectasis.
2. Cardiomegaly. Coarsened interstitial markings throughout both
lungs, likely chronic.

## 2020-05-10 IMAGING — CT CT ANGIO CHEST-ABD-PELV FOR DISSECTION W/ AND WO/W CM
2 of 7 series · 11 of 46 positions shown, 12 images · IV contrast (APPLIED)
Comparison: X-ray lumbar spine [DATE].

CLINICAL DATA: Chest pain and back pain, aortic dissection
suspected.

Lower back pain worse on the right.
Current everyday smoker.
EXAM:
CT ANGIOGRAPHY CHEST, ABDOMEN AND PELVIS
CT LUMBAR SPINE WITHOUT CONTRAST
TECHNIQUE: Non-contrast CT of the chest was initially obtained.

[Series 5: axial arterial · axial · arterial · 0.72mm/px · z∈[+43,+547]mm · 8 of 206 slices shown, 9 images]
[im 19/206  soft-tissue]
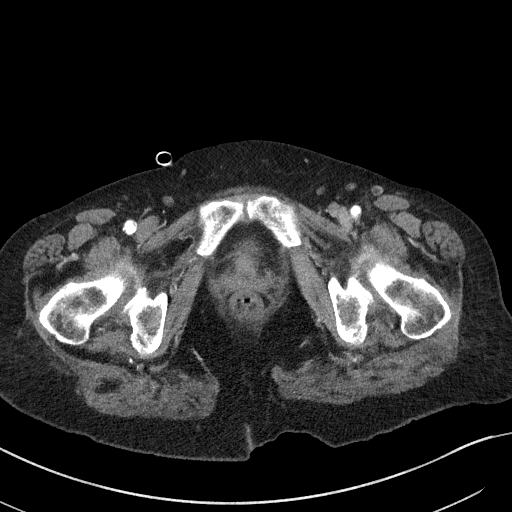
[im 19/206  bone]
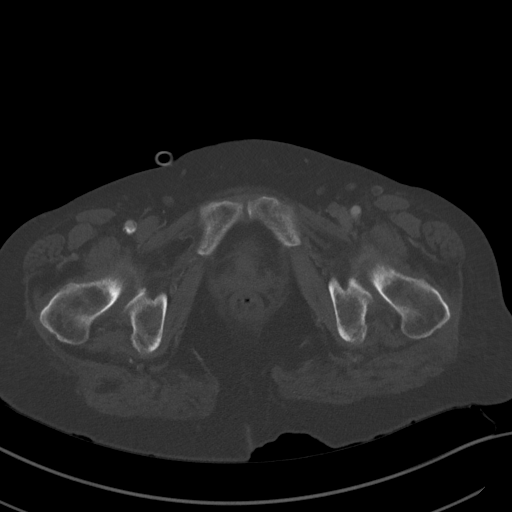
[im 38/206  soft-tissue]
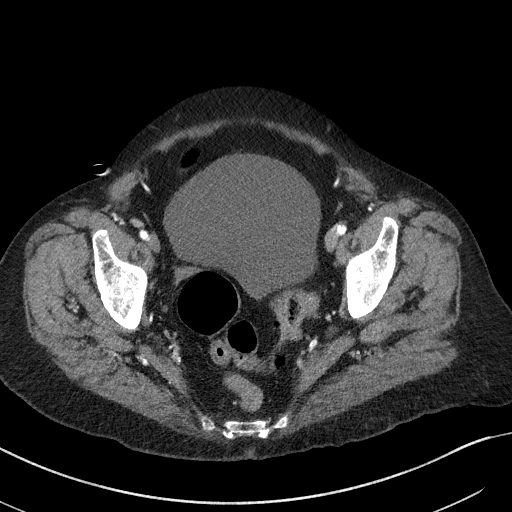
[im 75/206  soft-tissue]
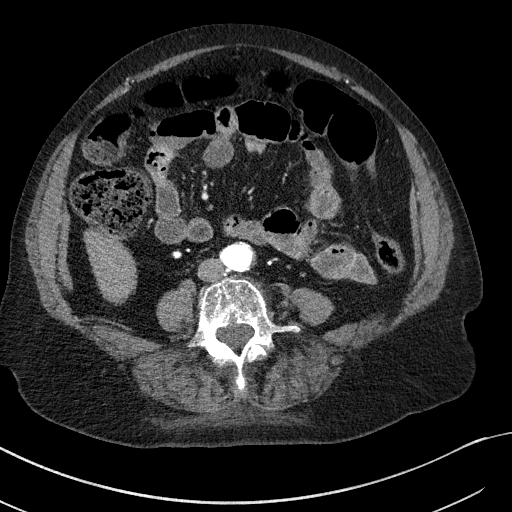
[im 94/206  soft-tissue]
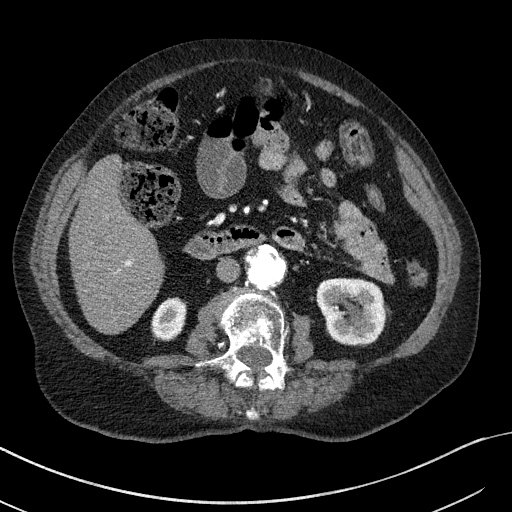
[im 112/206  soft-tissue]
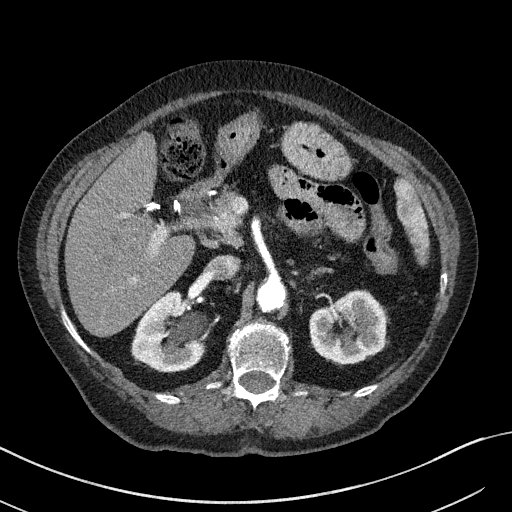
[im 131/206  soft-tissue]
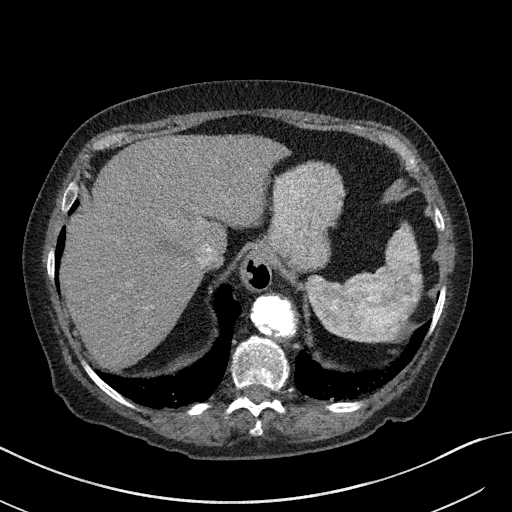
[im 168/206  soft-tissue]
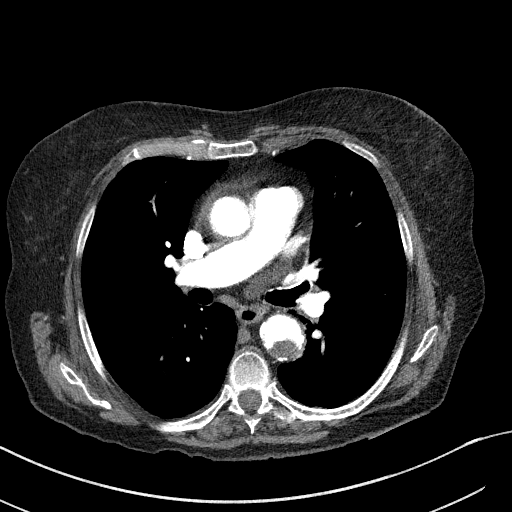
[im 187/206  soft-tissue]
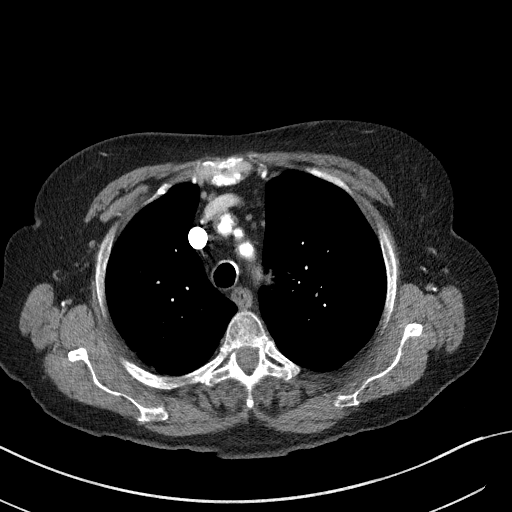

[Series 8: coronals · coronal · 0.69mm/px · 3 of 218 slices shown]
[im 55/218  soft-tissue]
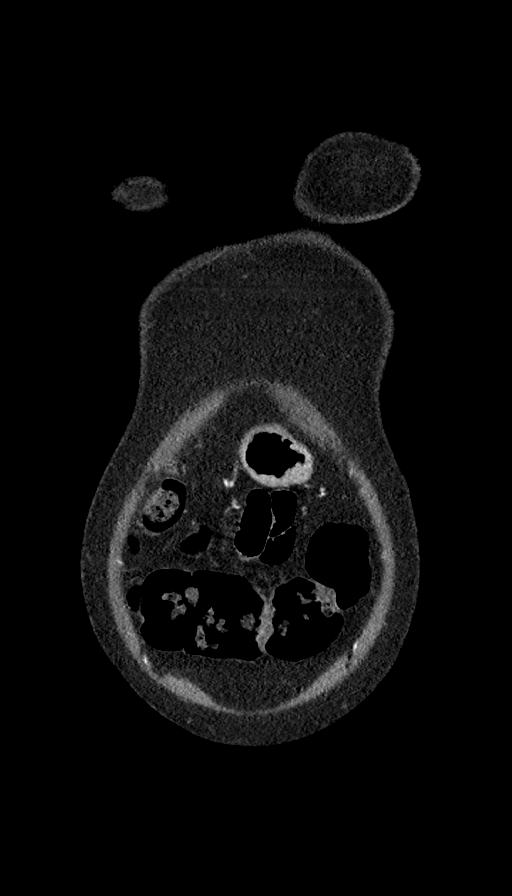
[im 109/218  soft-tissue]
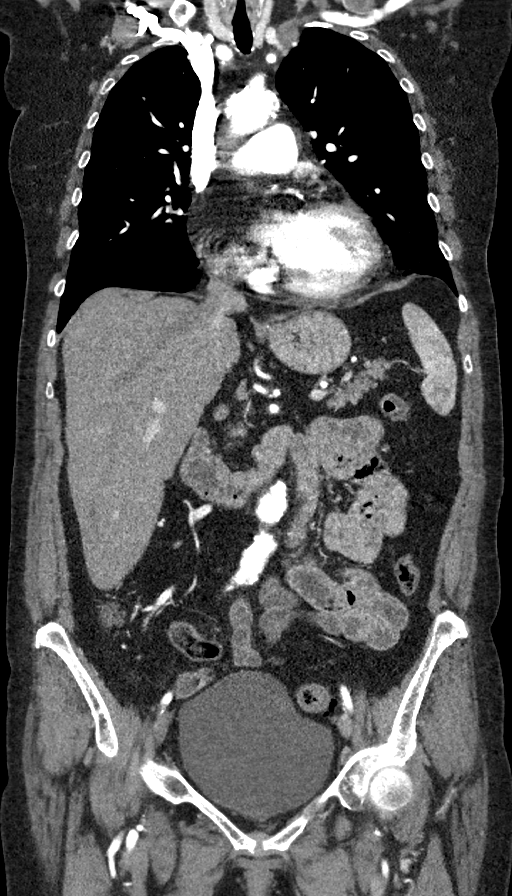
[im 163/218  soft-tissue]
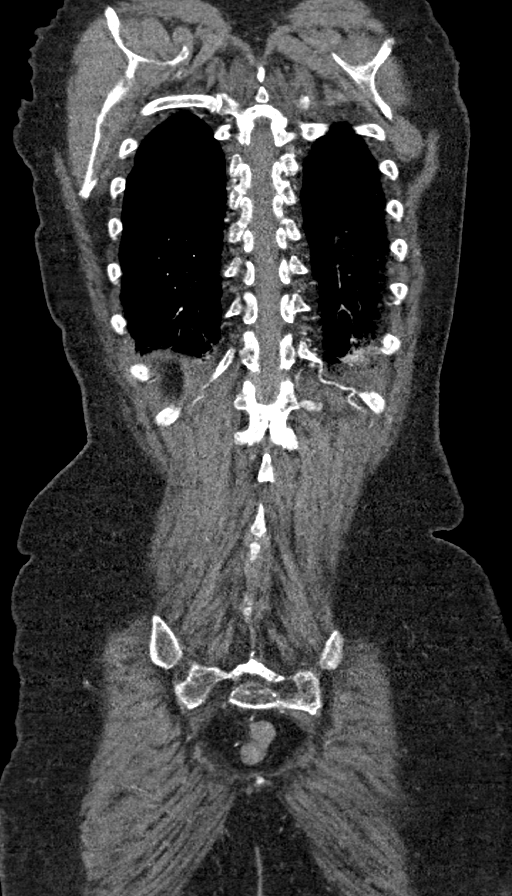

[11 of 46 positions shown; findings below may reference images not displayed]

Multidetector CT imaging through the chest, abdomen and pelvis was
performed using the standard protocol during bolus administration of
intravenous contrast. Multiplanar reconstructed images and MIPs were
obtained and reviewed to evaluate the vascular anatomy.

Multidetector CT imaging of the lumbar spine was performed without
intravenous contrast administration. Multiplanar CT image
reconstructions were also generated.

CONTRAST:  100mL OMNIPAQUE IOHEXOL 350 MG/ML SOLN
FINDINGS: VASCULAR

Aorta:

Aortic root calcifications noted. Extensive calcified and
noncalcified atherosclerotic. There is greater than 50% narrowing of
the descending thoracic aorta due to noncalcified plaque ([DATE], 68)
and similarly of the infrarenal abdominal aorta (5: 108).

The ascending aorta is normal in caliber. There is aneurysmal
dilatation of the distal descending thoracic aorta which measures up
to 4 cm in caliber ([DATE]) and extends approximately 5 cm in the
craniocaudal dimension. No associated periaortic fat stranding. No
dissection or vasculitis of the thoracic aorta. No aneurysm,
dissection, vasculitis of the abdominal aorta.

Coronary arteries: Least mild four-vessel coronary artery
calcifications.

Pulmonary artery: The main pulmonary artery is normal in caliber. No
central pulmonary embolus.

Celiac: Mild atherosclerotic plaque. Patent without evidence of
aneurysm, dissection, vasculitis or significant stenosis.

SMA: Mild atherosclerotic plaque. Patent without evidence of
aneurysm, dissection, vasculitis or significant stenosis.

Renals: Total of 2 left renal arteries. A single right renal artery
noted. Mild atherosclerotic plaque. Patent without evidence of
aneurysm, dissection, vasculitis or significant stenosis.

IMA: Mild atherosclerotic plaque. Patent without evidence of
aneurysm, dissection, vasculitis or significant stenosis.

Inflow: Moderate atherosclerotic plaque. Patent without evidence of
aneurysm, dissection, vasculitis or significant stenosis.

Veins: No obvious venous abnormality within the limitations of this
arterial phase study.

NON-VASCULAR

CTA CHEST FINDINGS

Cardiovascular: Preferential opacification of the thoracic aorta.
Normal heart size. Suggestion of mild mitral annular calcifications.
No significant pericardial effusion.

Mediastinum/Nodes: There is an enlarged 1 cm right hilar lymph node
([DATE]). No left hilar lymphadenopathy. There is an enlarged
precarinal lymph node measuring up to 1 cm ([DATE]). No axillary lymph
nodes. Thyroid gland, trachea, and esophagus demonstrate no
significant findings. Tiny hiatal hernia.

Lungs/Pleura: Biapical pleural/pulmonary scarring. There is a
spiculated 1.8 x 1.3 x 1.6 cm right upper lobe pulmonary nodule
([DATE]) with associated tethering of minor fissure. Right upper lobe
subpleural micronodule ([DATE]). A 0.5 cm nodular like subpleural
densities noted within the right apex ([DATE], [DATE]). No pulmonary
mass. No focal consolidation. Bilateral lower lobe subsegmental
atelectasis. Biapical pleural/pulmonary scarring. No pleural
effusion. No pneumothorax.

Musculoskeletal:

No chest wall abnormality

No suspicious lytic or blastic osseous lesions. No acute displaced
rib fracture. Chronic appearing compression fracture of the T6 and
T7 vertebral bodies. Multilevel degenerative changes of the spine.

CTA ABDOMEN AND PELVIS FINDINGS

Hepatobiliary: The liver is enlarged measuring up to 20 cm. No focal
liver abnormality is seen. Status post cholecystectomy. No biliary
dilatation.

Pancreas: No focal lesion. Normal pancreatic contour. No surrounding
inflammatory changes. No main pancreatic ductal dilatation.

Spleen: Heterogeneous appearance of the spleen consistent with
arterial phase. Normal in size without focal abnormality.

Adrenals/Urinary Tract: No adrenal nodule bilaterally. Bilateral
kidneys enhance symmetrically. No hydronephrosis. No hydroureter.
The urinary bladder is unremarkable.

Stomach/Bowel: Stomach is within normal limits. No evidence of bowel
wall thickening or dilatation. Sigmoid diverticulosis. The appendix
is not definitely identified.

Lymphatic: No lymphadenopathy.

Reproductive: Status post hysterectomy. No adnexal masses.

Other: No intraperitoneal free fluid. No intraperitoneal free gas.
No organized fluid collection.

Musculoskeletal:

No abdominal wall hernia or abnormality.

No suspicious lytic or blastic osseous lesions. No acute displaced
fracture of the pelvis or sacrum. Multilevel degenerative changes of
the spine.

CT LUMPAR SPINE:

Segmentation: 6 non-rib-bearing lumbar vertebral bodies. To keep
with nomenclature for prior x-ray, lower most vertebra is numbered
S1.

Alignment: Similar-appearing grade 1 anterolisthesis of L5 on S1.
Slight dextrocurvature of the lower lumbar spine.

Vertebra/Intervertebral disc space: Similar-appearing severe
degenerative changes at L2-L3 level and S1 level with intervertebral
disc space vacuum phenomenon and endplate sclerosis. Facet
arthropathy noted at the L5-S1 level. No severe neural foraminal
stenosis. No severe central canal stenosis.

Paraspinal and other soft tissues: Negative.

Review of the MIP images confirms the above findings.
IMPRESSION: VASCULAR:

1. Aneurysmal dilatation of the distal descending thoracic aorta
(measuring up to 4 cm). No CT findings to suggest imminent rupture.
2. Extensive calcified and noncalcified atherosclerotic plaque of
the thoracic and abdominal aorta with area of atherosclerotic plaque
leading to greater than 50% narrowing of the lumen. Aortic
Atherosclerosis ([TT]-[TT]).

NONVASCULAR:

1. A 1.8 cm right upper lobe pulmonary nodule. Indeterminate right
hilar and precarinal borderline enlarged lymphadenopathy. Findings
concerning for malignancy. Additional imaging evaluation or
consultation with Pulmonology or Thoracic Surgery recommended.
2. Hepatomegaly.
3. Sigmoid diverticulosis no acute diverticulitis.

LUMPAR SPINE:

1. No acute displaced fracture or traumatic listhesis of the lumbar
spine.

## 2020-05-10 IMAGING — CR DG LUMBAR SPINE 2-3V
1 series · 3 of 3 positions shown · non-contrast
Comparison: None.

CLINICAL DATA: 79-year-old female with back pain.

EXAM:
LUMBAR SPINE - 2-3 VIEW

[Series 1: dg lumbar spine 2-3 views · 0.14mm/px · 3 of 3 slices shown]
[im 1/3]
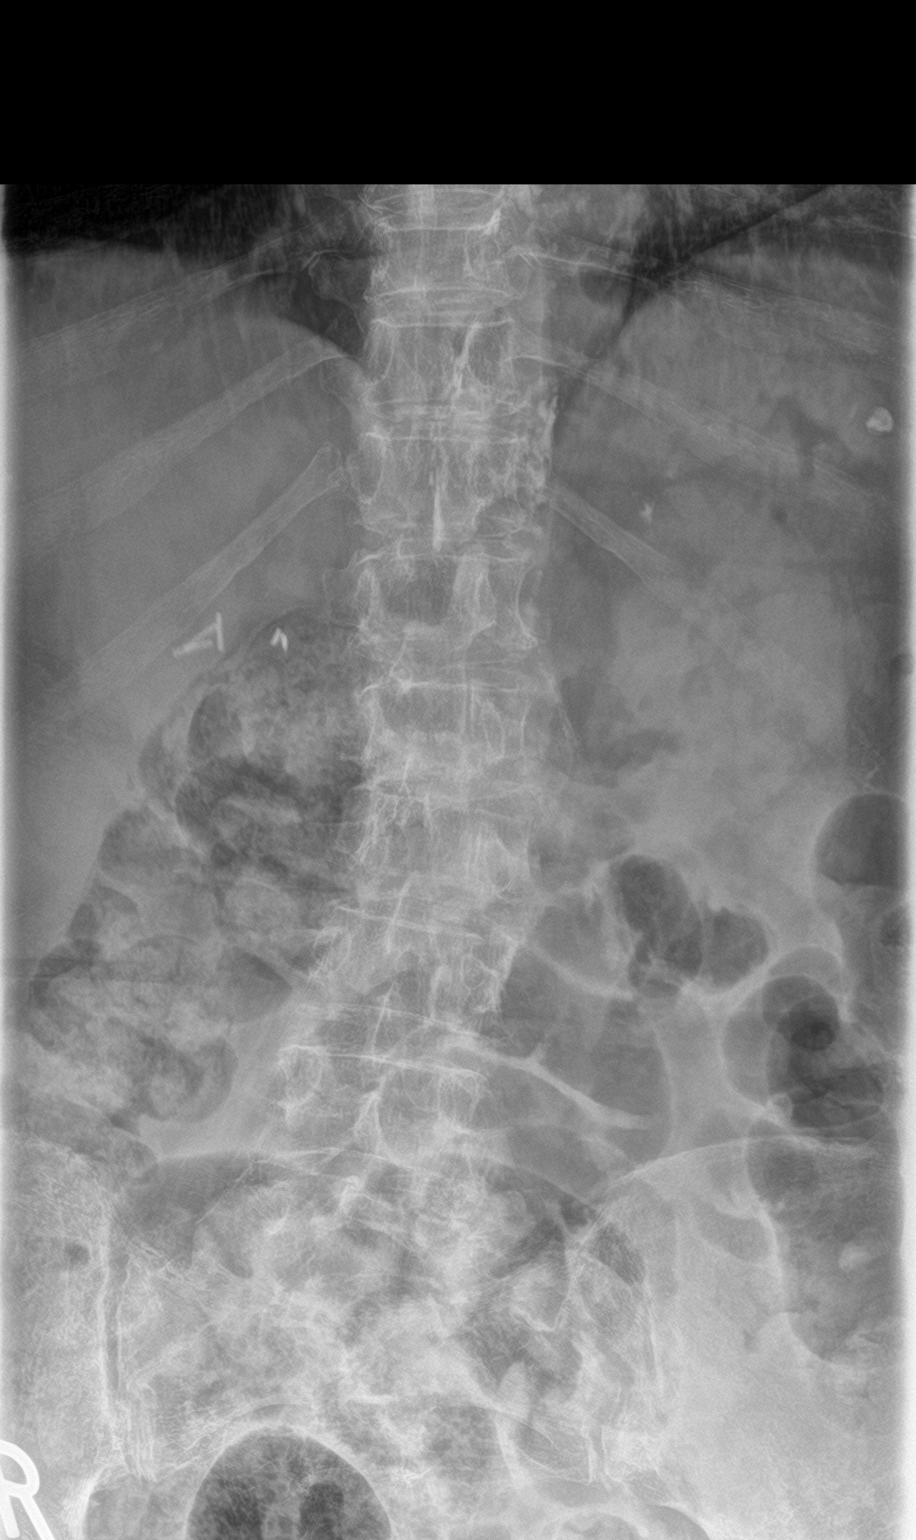
[im 2/3]
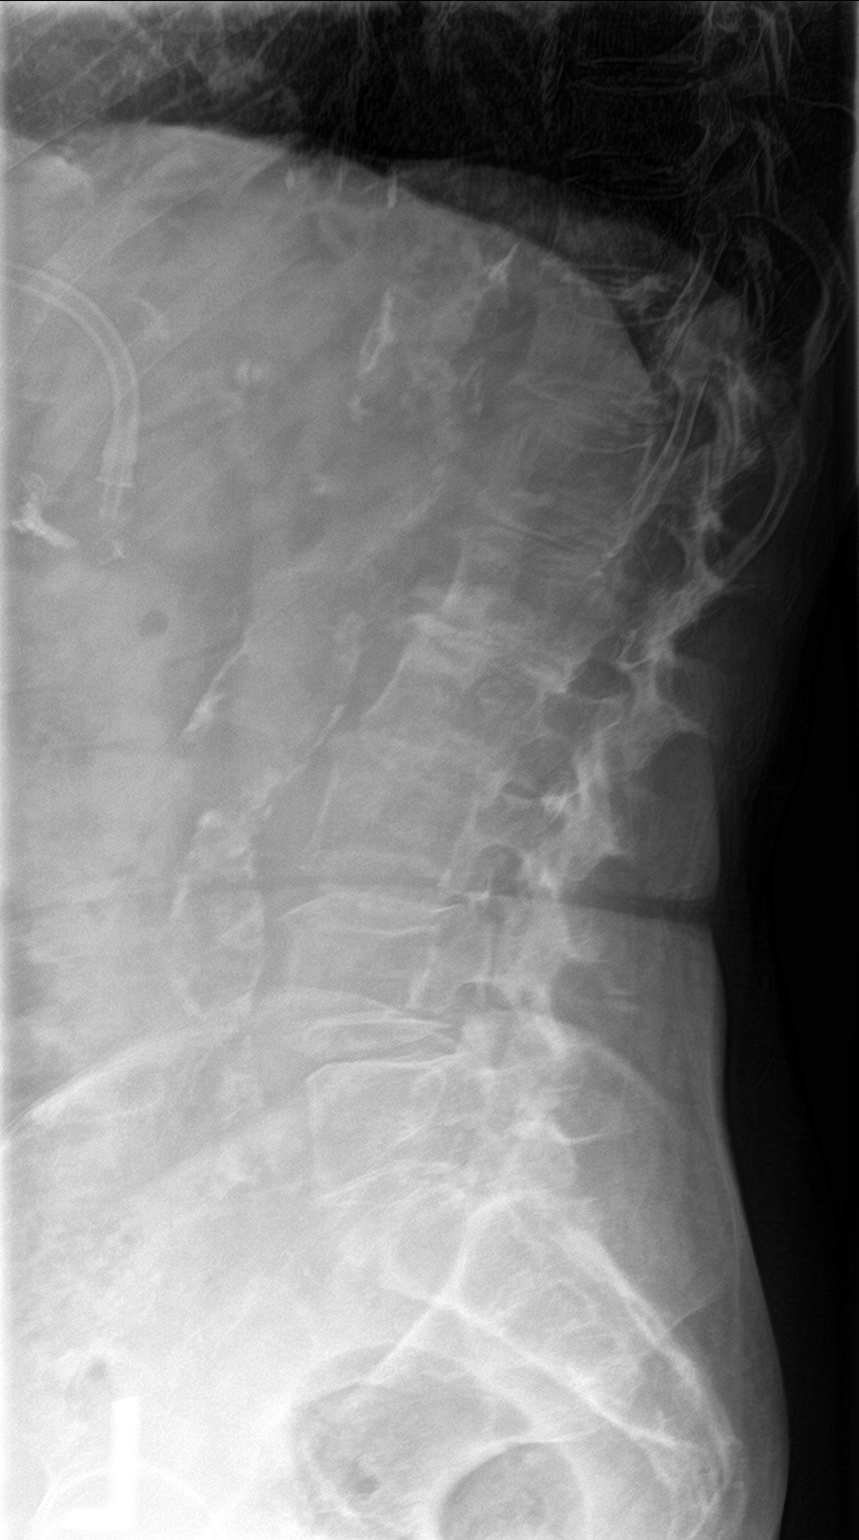
[im 3/3]
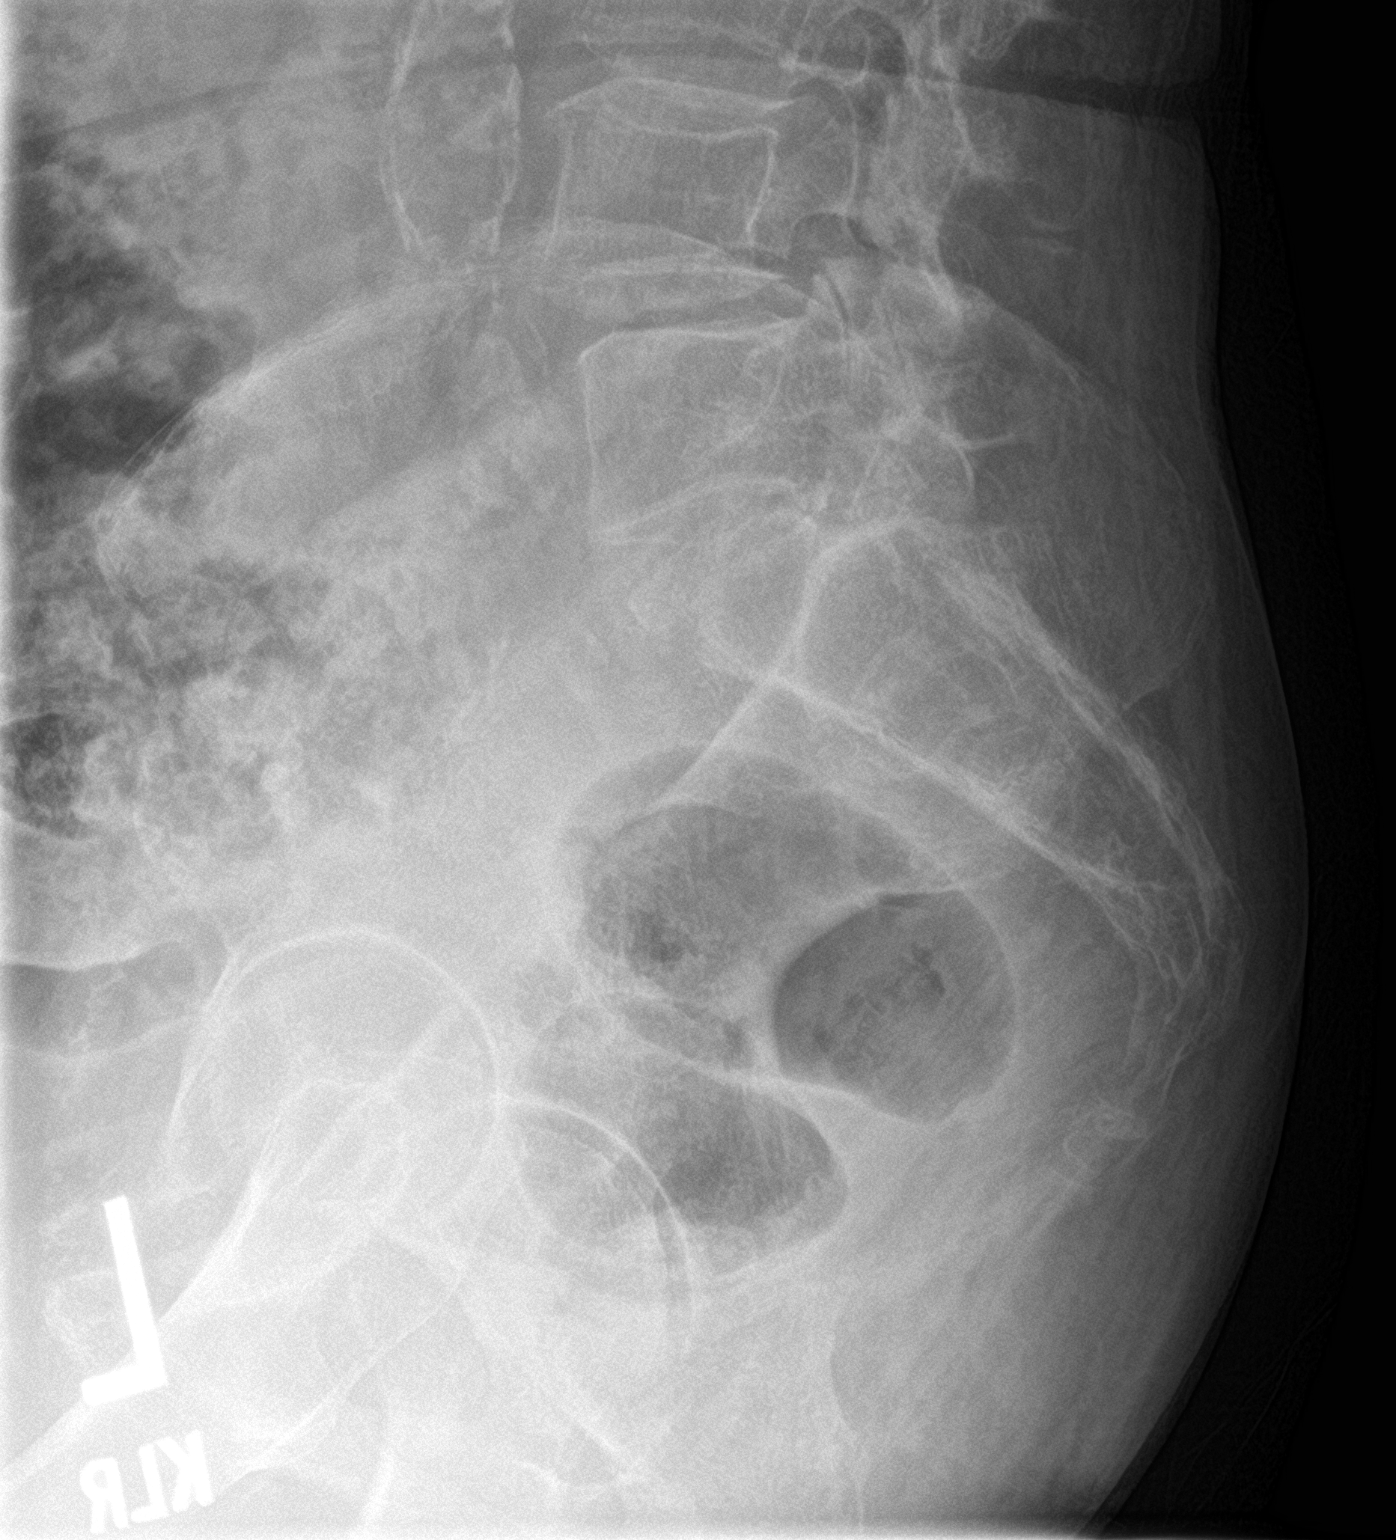

[3 of 3 positions shown; findings below may reference images not displayed]

FINDINGS: Six lumbar type vertebra. The lowermost vertebra is numbered as S1.
There is no acute fracture or subluxation of the lumbar spine. The
bones are osteopenic. Multilevel degenerative changes with disc
space narrowing and endplate irregularity primarily at L2-L3. There
is grade 1 L5-S1 anterolisthesis. The visualized posterior elements
are intact. There is mild levoscoliosis. The soft tissues are
unremarkable. Right upper quadrant cholecystectomy clips. Small
calcific densities in the left upper quadrant. There is
atherosclerotic calcification of the aorta. The aorta is aneurysmal
measuring 3.3 cm.
IMPRESSION: 1. No acute fracture or subluxation of the lumbar spine.
2. Multilevel degenerative changes of the lumbar spine.
3. Abdominal aortic aneurysm measuring 3.3 cm. Aortic
Atherosclerosis ([WJ]-[WJ]).

## 2020-05-10 MED ORDER — BISACODYL 5 MG PO TBEC: 5.0000 mg | DELAYED_RELEASE_TABLET | Freq: Every day | ORAL | Status: DC | PRN

## 2020-05-10 MED ORDER — IOHEXOL 350 MG/ML SOLN
100.0000 mL | Freq: Once | INTRAVENOUS | Status: AC | PRN
  Administered 2020-05-10: 100 mL via INTRAVENOUS
  Filled 2020-05-10: qty 100

## 2020-05-10 MED ORDER — ONDANSETRON HCL 4 MG PO TABS: 4.0000 mg | ORAL_TABLET | Freq: Four times a day (QID) | ORAL | Status: DC | PRN

## 2020-05-10 MED ORDER — ACETAMINOPHEN 325 MG PO TABS
650.0000 mg | ORAL_TABLET | Freq: Four times a day (QID) | ORAL | Status: DC | PRN
  Administered 2020-05-12: 650 mg via ORAL
  Filled 2020-05-10: qty 2

## 2020-05-10 MED ORDER — ONDANSETRON HCL 4 MG/2ML IJ SOLN: 4.0000 mg | Freq: Four times a day (QID) | INTRAMUSCULAR | Status: DC | PRN

## 2020-05-10 MED ORDER — SODIUM CHLORIDE 0.9 % IV SOLN: INTRAVENOUS | Status: DC

## 2020-05-10 MED ORDER — MORPHINE SULFATE (PF) 2 MG/ML IV SOLN
2.0000 mg | Freq: Once | INTRAVENOUS | Status: AC
  Administered 2020-05-10: 2 mg via INTRAVENOUS
  Filled 2020-05-10: qty 1

## 2020-05-10 MED ORDER — NICOTINE 21 MG/24HR TD PT24
21.0000 mg | MEDICATED_PATCH | Freq: Every day | TRANSDERMAL | Status: DC
  Administered 2020-05-10 – 2020-05-12 (×3): 21 mg via TRANSDERMAL
  Filled 2020-05-10 (×3): qty 1

## 2020-05-10 MED ORDER — INSULIN ASPART 100 UNIT/ML ~~LOC~~ SOLN
0.0000 [IU] | SUBCUTANEOUS | Status: DC
  Administered 2020-05-11 (×2): 1 [IU] via SUBCUTANEOUS
  Administered 2020-05-11: 2 [IU] via SUBCUTANEOUS
  Administered 2020-05-12: 1 [IU] via SUBCUTANEOUS
  Filled 2020-05-10 (×4): qty 1

## 2020-05-10 MED ORDER — MORPHINE SULFATE (PF) 2 MG/ML IV SOLN
2.0000 mg | INTRAVENOUS | Status: DC | PRN
Start: 2020-05-10 — End: 2020-05-11
  Administered 2020-05-10 – 2020-05-11 (×2): 2 mg via INTRAVENOUS
  Filled 2020-05-10 (×2): qty 1

## 2020-05-10 MED ORDER — SENNOSIDES-DOCUSATE SODIUM 8.6-50 MG PO TABS: 1.0000 | ORAL_TABLET | Freq: Every evening | ORAL | Status: DC | PRN

## 2020-05-10 MED ORDER — HYDROCODONE-ACETAMINOPHEN 5-325 MG PO TABS
1.0000 | ORAL_TABLET | ORAL | Status: DC | PRN
  Administered 2020-05-11 – 2020-05-12 (×4): 1 via ORAL
  Filled 2020-05-10 (×4): qty 1

## 2020-05-10 MED ORDER — ACETAMINOPHEN 650 MG RE SUPP: 650.0000 mg | Freq: Four times a day (QID) | RECTAL | Status: DC | PRN

## 2020-05-10 NOTE — ED Triage Notes (Signed)
Pt to ER with c/o lower back pain that is worse on right.  States started on Saturday.  States worse with movement and when she bends it feels like it is tightening.  Denies injury, denies urinary symptoms.  Pain does not radiate.

## 2020-05-10 NOTE — ED Notes (Signed)
Marissa Fletcher (family) [phone]

## 2020-05-10 NOTE — H&P (Signed)
History and Physical    Marissa Fletcher:[id number] DOB: [birth year] DOA: 05/10/2020  PCP: Juluis Pitch, MD   Patient coming from: Home   Chief Complaint: Back pain   HPI: Marissa Fletcher is a 79 y.o. female with medical history significant for hypertension, type 2 diabetes mellitus, depression, anxiety, iron deficiency anemia, and tobacco abuse, now presenting to the emergency department for evaluation of back pain.  Patient reports that she developed pain in the lower thoracic back 3 days ago.  Pain is primarily in the lower thoracic back on the right side, severe with certain movements, and relieved if she remains completely still.  She is unable to identify any precipitating event and had never experienced this particular pain before.  She describes it as cramping in character.  She denies any recent fevers, chills, weight loss, chest pain, new cough, or shortness of breath.  She has had a sensation of abdominal fullness recently as well as recent leg swelling, but notes that these have improved.  She denies any melena or hematochezia and reports a negative home stool test recently.  Patient states that she had been started on iron supplements recently but is not currently using them.  ED Course: Upon arrival to the ED, patient is found to be afebrile, saturating well on room air, and with blood pressure 110/67.  Chemistry panel is unremarkable and CBC notable for microcytic anemia with hemoglobin 7.9, down from 8.9 a week ago and 11.3 in May.  High-sensitivity troponin is normal.  Chest x-ray notable for cardiomegaly and interstitial coarsening that appears chronic.  Plain films of the L-spine are negative for acute fracture or subluxation.  CT L-spine also negative for acute findings.  CTA chest/abdomen/pelvis reveals a 1.8 cm RML nodule and enlarged lymph nodes, as well as aneurysmal dilation of distal descending thoracic aorta up to 4 cm without dissection or suggestion of imminent  rupture.  Pain was relieved with 2 mg IV morphine in the ED.  Type and screen was performed.  COVID-19 screening test is pending.  Vascular surgery was consulted by the ED physician and recommended having the patient observed in the hospital with plan for formal consultation tomorrow.  Review of Systems:  All other systems reviewed and apart from HPI, are negative.  Past Medical History:  Diagnosis Date  . Diabetes mellitus without complication ([place])   . Hypertension     Past Surgical History:  Procedure Laterality Date  . ABDOMINAL HYSTERECTOMY      Social History:   reports that she has been smoking. She has never used smokeless tobacco. No history on file for alcohol use and drug use.  Allergies  Allergen Reactions  . Sulfa Antibiotics Swelling    Family History  Problem Relation Age of Onset  . Breast cancer Neg Hx      Prior to Admission medications   Not on File    Physical Exam: Vitals:   05/10/20 1348 05/10/20 1716 05/10/20 1925 05/10/20 1930  BP:  110/67 (!) 118/56 (!) 131/55  Pulse:  88 86 88  Resp:  18 19 (!) 21  Temp:  98.7 F (37.1 C)    TempSrc:  Oral    SpO2:  99% 90% 92%  Weight: 72.6 kg     Height: 5\' 4"  (1.626 m)       Constitutional: NAD, calm  Eyes: PERTLA, lids and conjunctivae normal ENMT: Mucous membranes are moist. Posterior pharynx clear of any exudate or lesions.   Neck: normal,  supple, no masses, no thyromegaly Respiratory: no wheezing, no crackles. No accessory muscle use.  Cardiovascular: S1 & S2 heard, regular rate and rhythm. No extremity edema.   Abdomen: Distension, no tenderness, soft. Bowel sounds active.  Musculoskeletal: no clubbing / cyanosis. No joint deformity upper and lower extremities.   Skin: no significant rashes, lesions, ulcers. Warm, dry, well-perfused. Neurologic: CN 2-12 grossly intact. Sensation intact. Moving all extremities.  Psychiatric: Alert and oriented to person, place, and situation. Pleasant and  cooperative.    Labs and Imaging on Admission: I have personally reviewed following labs and imaging studies  CBC: Recent Labs  Lab 05/10/20 1537 05/10/20 1921  WBC 8.7  --   NEUTROABS 5.9  --   HGB 7.9* 7.8*  HCT 26.4* 26.2*  MCV 75.2*  --   PLT 341  --    Basic Metabolic Panel: Recent Labs  Lab 05/10/20 1537  NA 134*  K 3.9  CL 98  CO2 25  GLUCOSE 117*  BUN 12  CREATININE 0.83  CALCIUM 8.7*   GFR: Estimated Creatinine Clearance: 52.8 mL/min (by C-G formula based on SCr of 0.83 mg/dL). Liver Function Tests: Recent Labs  Lab 05/10/20 1537  AST 25  ALT 16  ALKPHOS 125  BILITOT 0.5  PROT 6.8  ALBUMIN 3.4*   No results for input(s): LIPASE, AMYLASE in the last 168 hours. No results for input(s): AMMONIA in the last 168 hours. Coagulation Profile: Recent Labs  Lab 05/10/20 1921  INR 1.0   Cardiac Enzymes: No results for input(s): CKTOTAL, CKMB, CKMBINDEX, TROPONINI in the last 168 hours. BNP (last 3 results) No results for input(s): PROBNP in the last 8760 hours. HbA1C: No results for input(s): HGBA1C in the last 72 hours. CBG: No results for input(s): GLUCAP in the last 168 hours. Lipid Profile: No results for input(s): CHOL, HDL, LDLCALC, TRIG, CHOLHDL, LDLDIRECT in the last 72 hours. Thyroid Function Tests: No results for input(s): TSH, T4TOTAL, FREET4, T3FREE, THYROIDAB in the last 72 hours. Anemia Panel: No results for input(s): VITAMINB12, FOLATE, FERRITIN, TIBC, IRON, RETICCTPCT in the last 72 hours. Urine analysis:    Component Value Date/Time   COLORURINE YELLOW (A) 05/10/2020 1352   APPEARANCEUR HAZY (A) 05/10/2020 1352   LABSPEC 1.013 05/10/2020 1352   PHURINE 6.0 05/10/2020 1352   GLUCOSEU NEGATIVE 05/10/2020 1352   HGBUR NEGATIVE 05/10/2020 1352   BILIRUBINUR NEGATIVE 05/10/2020 1352   KETONESUR NEGATIVE 05/10/2020 1352   PROTEINUR NEGATIVE 05/10/2020 1352   NITRITE NEGATIVE 05/10/2020 1352   LEUKOCYTESUR NEGATIVE 05/10/2020 1352    Sepsis Labs: @LABRCNTIP (procalcitonin:4,lacticidven:4) )No results found for this or any previous visit (from the past 240 hour(s)).   Radiological Exams on Admission: DG Chest 2 View  Result Date: 05/10/2020 CLINICAL DATA:  Back pain EXAM: CHEST - 2 VIEW COMPARISON:  None. FINDINGS: Cardiomegaly. Atherosclerotic calcification of the aortic knob. Coarsened interstitial markings throughout both lungs. Minimal linear opacity in the periphery of the right lung base. No pleural effusion or pneumothorax. Osseous structures appear mildly demineralized. No acute findings. IMPRESSION: 1. Minimal linear opacity in the periphery of the right lung base, likely atelectasis. 2. Cardiomegaly. Coarsened interstitial markings throughout both lungs, likely chronic. Electronically Signed   By: Davina Poke D.O.   On: 05/10/2020 15:58   DG Lumbar Spine 2-3 Views  Result Date: 05/10/2020 CLINICAL DATA:  79 year old female with back pain. EXAM: LUMBAR SPINE - 2-3 VIEW COMPARISON:  None. FINDINGS: Six lumbar type vertebra. The lowermost vertebra is numbered as  S1. There is no acute fracture or subluxation of the lumbar spine. The bones are osteopenic. Multilevel degenerative changes with disc space narrowing and endplate irregularity primarily at L2-L3. There is grade 1 L5-S1 anterolisthesis. The visualized posterior elements are intact. 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 The soft tissues are unremarkable. Right upper quadrant cholecystectomy clips. Small calcific densities in the left upper quadrant. There is atherosclerotic calcification of the aorta. The aorta is aneurysmal measuring 3.3 cm. IMPRESSION: 1. No acute fracture or subluxation of the lumbar spine. 2. Multilevel degenerative changes of the lumbar spine. 3. 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 Aortic Atherosclerosis (ICD10-I70.0). Electronically Signed   By: Anner Crete M.D.   On: 05/10/2020 15:55   DG Abdomen 1 View  Result Date:  05/10/2020 CLINICAL DATA:  Back pain. EXAM: ABDOMEN - 1 VIEW COMPARISON:  None. FINDINGS: The bowel gas pattern is normal. No radio-opaque calculi or other significant radiographic abnormality are seen. IMPRESSION: Negative. Electronically Signed   By: Constance Holster M.D.   On: 05/10/2020 15:58   CT L-SPINE NO CHARGE  Result Date: 05/10/2020 CLINICAL DATA:  Chest pain and back pain, aortic dissection suspected. Lower back pain worse on the right. Current everyday smoker. EXAM: CT ANGIOGRAPHY CHEST, ABDOMEN AND PELVIS CT LUMBAR SPINE WITHOUT CONTRAST TECHNIQUE: Non-contrast CT of the chest was initially obtained. Multidetector CT imaging through the chest, abdomen and pelvis was performed using the standard protocol during bolus administration of intravenous contrast. Multiplanar reconstructed images and MIPs were obtained and reviewed to evaluate the vascular anatomy. Multidetector CT imaging of the lumbar spine was performed without intravenous contrast administration. Multiplanar CT image reconstructions were also generated. CONTRAST:  179mL OMNIPAQUE IOHEXOL 350 MG/ML SOLN COMPARISON:  X-ray lumbar spine 05/10/2020. FINDINGS: VASCULAR Aorta: Aortic root calcifications noted. Extensive calcified and noncalcified atherosclerotic. There is greater than 50% narrowing of the descending thoracic aorta due to noncalcified plaque (5:44, 68) and similarly of the infrarenal abdominal aorta (5: 108). The ascending aorta is normal in caliber. There is aneurysmal dilatation of the distal descending thoracic aorta which measures up to 4 cm in caliber (8:140) and extends approximately 5 cm in the craniocaudal dimension. No associated periaortic fat stranding. No dissection or vasculitis of the thoracic aorta. No aneurysm, dissection, vasculitis of the abdominal aorta. Coronary arteries: Least mild four-vessel coronary artery calcifications. Pulmonary artery: The main pulmonary artery is normal in caliber. No central  pulmonary embolus. Celiac: Mild atherosclerotic plaque. Patent without evidence of aneurysm, dissection, vasculitis or significant stenosis. SMA: Mild atherosclerotic plaque. Patent without evidence of aneurysm, dissection, vasculitis or significant stenosis. Renals: Total of 2 left renal arteries. A single right renal artery noted. Mild atherosclerotic plaque. Patent without evidence of aneurysm, dissection, vasculitis or significant stenosis. IMA: Mild atherosclerotic plaque. Patent without evidence of aneurysm, dissection, vasculitis or significant stenosis. Inflow: Moderate atherosclerotic plaque. Patent without evidence of aneurysm, dissection, vasculitis or significant stenosis. Veins: No obvious venous abnormality within the limitations of this arterial phase study. NON-VASCULAR CTA CHEST FINDINGS Cardiovascular: Preferential opacification of the thoracic aorta. Normal heart size. Suggestion of mild mitral annular calcifications. No significant pericardial effusion. Mediastinum/Nodes: There is an enlarged 1 cm right hilar lymph node (5:37). No left hilar lymphadenopathy. There is an enlarged precarinal lymph node measuring up to 1 cm (5:31). No axillary lymph nodes. Thyroid gland, trachea, and esophagus demonstrate no significant findings. Tiny hiatal hernia. Lungs/Pleura: Biapical pleural/pulmonary scarring. There is a spiculated 1.8 x 1.3 x 1.6 cm right upper lobe pulmonary nodule (6:56) with  associated tethering of minor fissure. Right upper lobe subpleural micronodule (6:56). A 0.5 cm nodular like subpleural densities noted within the right apex (6:21, 8:145). No pulmonary mass. No focal consolidation. Bilateral lower lobe subsegmental atelectasis. Biapical pleural/pulmonary scarring. No pleural effusion. No pneumothorax. Musculoskeletal: No chest wall abnormality No suspicious lytic or blastic osseous lesions. No acute displaced rib fracture. Chronic appearing compression fracture of the T6 and T7  vertebral bodies. Multilevel degenerative changes of the spine. CTA ABDOMEN AND PELVIS FINDINGS Hepatobiliary: The liver is enlarged measuring up to 20 cm. No focal liver abnormality is seen. Status post cholecystectomy. No biliary dilatation. Pancreas: No focal lesion. Normal pancreatic contour. No surrounding inflammatory changes. No main pancreatic ductal dilatation. Spleen: Heterogeneous appearance of the spleen consistent with arterial phase. Normal in size without focal abnormality. Adrenals/Urinary Tract: No adrenal nodule bilaterally. Bilateral kidneys enhance symmetrically. No hydronephrosis. No hydroureter. The urinary bladder is unremarkable. Stomach/Bowel: Stomach is within normal limits. No evidence of bowel wall thickening or dilatation. Sigmoid diverticulosis. The appendix is not definitely identified. Lymphatic: No lymphadenopathy. Reproductive: Status post hysterectomy. No adnexal masses. Other: No intraperitoneal free fluid. No intraperitoneal free gas. No organized fluid collection. Musculoskeletal: No abdominal wall hernia or abnormality. No suspicious lytic or blastic osseous lesions. No acute displaced fracture of the pelvis or sacrum. Multilevel degenerative changes of the spine. CT LUMPAR SPINE: Segmentation: 6 non-rib-bearing lumbar vertebral bodies. To keep with nomenclature for prior x-ray, lower most vertebra is numbered S1. Alignment: Similar-appearing grade 1 anterolisthesis of L5 on S1. Slight dextrocurvature of the lower lumbar spine. Vertebra/Intervertebral disc space: Similar-appearing severe degenerative changes at L2-L3 level and S1 level with intervertebral disc space vacuum phenomenon and endplate sclerosis. Facet arthropathy noted at the L5-S1 level. No severe neural foraminal stenosis. No severe central canal stenosis. Paraspinal and other soft tissues: Negative. Review of the MIP images confirms the above findings. IMPRESSION: VASCULAR: 1. Aneurysmal dilatation of the distal  descending thoracic aorta (measuring up to 4 cm). No CT findings to suggest imminent rupture. 2. Extensive calcified and noncalcified atherosclerotic plaque of the thoracic and abdominal aorta with area of atherosclerotic plaque leading to greater than 50% narrowing of the lumen. Aortic Atherosclerosis (ICD10-I70.0). NONVASCULAR: 1. A 1.8 cm right upper lobe pulmonary nodule. Indeterminate right hilar and precarinal borderline enlarged lymphadenopathy. Findings concerning for malignancy. Additional imaging evaluation or consultation with Pulmonology or Thoracic Surgery recommended. 2. Hepatomegaly. 3. Sigmoid diverticulosis no acute diverticulitis. LUMPAR SPINE: 1. No acute displaced fracture or traumatic listhesis of the lumbar spine. Electronically Signed   By: Iven Finn M.D.   On: 05/10/2020 19:02   CT Angio Chest/Abd/Pel for Dissection W and/or Wo Contrast  Result Date: 05/10/2020 CLINICAL DATA:  Chest pain and back pain, aortic dissection suspected. Lower back pain worse on the right. Current everyday smoker. EXAM: CT ANGIOGRAPHY CHEST, ABDOMEN AND PELVIS CT LUMBAR SPINE WITHOUT CONTRAST TECHNIQUE: Non-contrast CT of the chest was initially obtained. Multidetector CT imaging through the chest, abdomen and pelvis was performed using the standard protocol during bolus administration of intravenous contrast. Multiplanar reconstructed images and MIPs were obtained and reviewed to evaluate the vascular anatomy. Multidetector CT imaging of the lumbar spine was performed without intravenous contrast administration. Multiplanar CT image reconstructions were also generated. CONTRAST:  15mL OMNIPAQUE IOHEXOL 350 MG/ML SOLN COMPARISON:  X-ray lumbar spine 05/10/2020. FINDINGS: VASCULAR Aorta: Aortic root calcifications noted. Extensive calcified and noncalcified atherosclerotic. There is greater than 50% narrowing of the descending thoracic aorta due to noncalcified plaque (5:44,  68) and similarly of the  infrarenal abdominal aorta (5: 108). The ascending aorta is normal in caliber. There is aneurysmal dilatation of the distal descending thoracic aorta which measures up to 4 cm in caliber (8:140) and extends approximately 5 cm in the craniocaudal dimension. No associated periaortic fat stranding. No dissection or vasculitis of the thoracic aorta. No aneurysm, dissection, vasculitis of the abdominal aorta. Coronary arteries: Least mild four-vessel coronary artery calcifications. Pulmonary artery: The main pulmonary artery is normal in caliber. No central pulmonary embolus. Celiac: Mild atherosclerotic plaque. Patent without evidence of aneurysm, dissection, vasculitis or significant stenosis. SMA: Mild atherosclerotic plaque. Patent without evidence of aneurysm, dissection, vasculitis or significant stenosis. Renals: Total of 2 left renal arteries. A single right renal artery noted. Mild atherosclerotic plaque. Patent without evidence of aneurysm, dissection, vasculitis or significant stenosis. IMA: Mild atherosclerotic plaque. Patent without evidence of aneurysm, dissection, vasculitis or significant stenosis. Inflow: Moderate atherosclerotic plaque. Patent without evidence of aneurysm, dissection, vasculitis or significant stenosis. Veins: No obvious venous abnormality within the limitations of this arterial phase study. NON-VASCULAR CTA CHEST FINDINGS Cardiovascular: Preferential opacification of the thoracic aorta. Normal heart size. Suggestion of mild mitral annular calcifications. No significant pericardial effusion. Mediastinum/Nodes: There is an enlarged 1 cm right hilar lymph node (5:37). No left hilar lymphadenopathy. There is an enlarged precarinal lymph node measuring up to 1 cm (5:31). No axillary lymph nodes. Thyroid gland, trachea, and esophagus demonstrate no significant findings. Tiny hiatal hernia. Lungs/Pleura: Biapical pleural/pulmonary scarring. There is a spiculated 1.8 x 1.3 x 1.6 cm right upper  lobe pulmonary nodule (6:56) with associated tethering of minor fissure. Right upper lobe subpleural micronodule (6:56). A 0.5 cm nodular like subpleural densities noted within the right apex (6:21, 8:145). No pulmonary mass. No focal consolidation. Bilateral lower lobe subsegmental atelectasis. Biapical pleural/pulmonary scarring. No pleural effusion. No pneumothorax. Musculoskeletal: No chest wall abnormality No suspicious lytic or blastic osseous lesions. No acute displaced rib fracture. Chronic appearing compression fracture of the T6 and T7 vertebral bodies. Multilevel degenerative changes of the spine. CTA ABDOMEN AND PELVIS FINDINGS Hepatobiliary: The liver is enlarged measuring up to 20 cm. No focal liver abnormality is seen. Status post cholecystectomy. No biliary dilatation. Pancreas: No focal lesion. Normal pancreatic contour. No surrounding inflammatory changes. No main pancreatic ductal dilatation. Spleen: Heterogeneous appearance of the spleen consistent with arterial phase. Normal in size without focal abnormality. Adrenals/Urinary Tract: No adrenal nodule bilaterally. Bilateral kidneys enhance symmetrically. No hydronephrosis. No hydroureter. The urinary bladder is unremarkable. Stomach/Bowel: Stomach is within normal limits. No evidence of bowel wall thickening or dilatation. Sigmoid diverticulosis. The appendix is not definitely identified. Lymphatic: No lymphadenopathy. Reproductive: Status post hysterectomy. No adnexal masses. Other: No intraperitoneal free fluid. No intraperitoneal free gas. No organized fluid collection. Musculoskeletal: No abdominal wall hernia or abnormality. No suspicious lytic or blastic osseous lesions. No acute displaced fracture of the pelvis or sacrum. Multilevel degenerative changes of the spine. CT LUMPAR SPINE: Segmentation: 6 non-rib-bearing lumbar vertebral bodies. To keep with nomenclature for prior x-ray, lower most vertebra is numbered S1. Alignment:  Similar-appearing grade 1 anterolisthesis of L5 on S1. Slight dextrocurvature of the lower lumbar spine. Vertebra/Intervertebral disc space: Similar-appearing severe degenerative changes at L2-L3 level and S1 level with intervertebral disc space vacuum phenomenon and endplate sclerosis. Facet arthropathy noted at the L5-S1 level. No severe neural foraminal stenosis. No severe central canal stenosis. Paraspinal and other soft tissues: Negative. Review of the MIP images confirms the above findings. IMPRESSION: VASCULAR:  1. 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 No CT findings to suggest imminent rupture. 2. Extensive calcified and noncalcified atherosclerotic plaque of the thoracic and abdominal aorta with area of atherosclerotic plaque leading to greater than 50% narrowing of the lumen. Aortic Atherosclerosis (ICD10-I70.0). NONVASCULAR: 1. A 1.8 cm right upper lobe pulmonary nodule. Indeterminate right hilar and precarinal borderline enlarged lymphadenopathy. Findings concerning for malignancy. Additional imaging evaluation or consultation with Pulmonology or Thoracic Surgery recommended. 2. Hepatomegaly. 3. Sigmoid diverticulosis no acute diverticulitis. LUMPAR SPINE: 1. No acute displaced fracture or traumatic listhesis of the lumbar spine. Electronically Signed   By: Iven Finn M.D.   On: 05/10/2020 19:02    Assessment/Plan   1. Back pain  - Presents with 3 days of back pain, different than her chronic pain and without any precipitating event   - There are no acute MSK findings on CT but CTA was notable for descending thoracic aortic aneurysm (without dissection or suggestion of impending rupture) and RML lung nodule   - Pain was relieved in ED with 2 mg IV morphine   - Muscle strain seems most likely, will continue supportive care and follow-up on vascular surgery recommendations    2. Descending thoracic aortic aneurysm  - Patient presents with 3  days of pain in lower thoracic back and had CTA chest/abd/pelvis that was notable for descending thoracic aortic aneurysm measuring up to 4 cm  - There were no findings on CT to suggest imminent rupture  - Vascular surgeon was consulted by ED physician and observation was recommended with plan for formal consultation 05/11/20  - Discussed importance of BP control and smoking cessation with patient and her family member   3. Lung nodule  - 1.8 cm RML lung nodule noted on CTA in ED with enlarged LN - These findings and recommendation for pulmonary or thoracic surgery consultation were discussed with patient and her family member    68. Anemia   - Hgb is 7.9 on admission, down from 8.9 one week ago and 11.3 in May 2021  - Patient denies melena or hematochezia, reports that she had negative Cologuard with PCP recently and was prescribed iron supplements but is not currently taking  - Type and screen performed in ED, will check FOBT and repeat CBC in am    5. Tobacco abuse - Patient smokes ~1 ppd  - Discussed importance of cessation, particularly with atherosclerosis and aneurysm on imaging - she does not believe she will be able to quit but is thinking about cutting back    6. Type II DM  - A1c was 6.7% earlier this month  - Pharmacy medication-reconciliation pending, will check CBGs and use low-intensity SSI for now    7. Hypertension  - BP at goal, treat as-needed for now and follow-up pharmacy medication-reconciliation     DVT prophylaxis: SCDs Code Status: Full  Family Communication: daughter in law updated at bedside  Disposition Plan:  Patient is from: home  Anticipated d/c is to: Home  Anticipated d/c date is: 05/11/20 Patient currently: Pending vascular surgery consultation, stable H&H  Consults called: vascular surgery  Admission status: Observation     Vianne Bulls, MD [facility]  05/10/2020, 8:29 PM

## 2020-05-10 NOTE — ED Provider Notes (Signed)
Medical screening examination/treatment/procedure(s) were conducted as a shared visit with non-physician practitioner(s) and myself.  I personally evaluated the patient during the encounter.  ----------------------------------------- 5:03 PM on 05/10/2020 -----------------------------------------    CTA  EKG   Bedside abdominal ultrasound to evaluate for aortic aneurysm is difficult and inhibited due to overlying bowel gas.  Patient is awake alert, conversant, does report of slight feeling of abdominal distention, and back pain.  She is hemodynamically stable at this point nontoxic-appearing.  Mild to moderate pain in her back.  We will proceed with CT angiogram to further evaluate   Delman Kitten, MD 05/10/20 [phone]

## 2020-05-10 NOTE — ED Provider Notes (Signed)
DG Chest 2 View  Result Date: 05/10/2020 CLINICAL DATA:  Back pain EXAM: CHEST - 2 VIEW COMPARISON:  None. FINDINGS: Cardiomegaly. Atherosclerotic calcification of the aortic knob. Coarsened interstitial markings throughout both lungs. Minimal linear opacity in the periphery of the right lung base. No pleural effusion or pneumothorax. Osseous structures appear mildly demineralized. No acute findings. IMPRESSION: 1. Minimal linear opacity in the periphery of the right lung base, likely atelectasis. 2. Cardiomegaly. Coarsened interstitial markings throughout both lungs, likely chronic. Electronically Signed   By: Davina Poke D.O.   On: 05/10/2020 15:58   DG Lumbar Spine 2-3 Views  Result Date: 05/10/2020 CLINICAL DATA:  79 year old female with back pain. EXAM: LUMBAR SPINE - 2-3 VIEW COMPARISON:  None. FINDINGS: Six lumbar type vertebra. The lowermost vertebra is numbered as S1. There is no acute fracture or subluxation of the lumbar spine. The bones are osteopenic. Multilevel degenerative changes with disc space narrowing and endplate irregularity primarily at L2-L3. There is grade 1 L5-S1 anterolisthesis. The visualized posterior elements are intact. 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 The soft tissues are unremarkable. Right upper quadrant cholecystectomy clips. Small calcific densities in the left upper quadrant. There is atherosclerotic calcification of the aorta. The aorta is aneurysmal measuring 3.3 cm. IMPRESSION: 1. No acute fracture or subluxation of the lumbar spine. 2. Multilevel degenerative changes of the lumbar spine. 3. 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 Aortic Atherosclerosis (ICD10-I70.0). Electronically Signed   By: Anner Crete M.D.   On: 05/10/2020 15:55   DG Abdomen 1 View  Result Date: 05/10/2020 CLINICAL DATA:  Back pain. EXAM: ABDOMEN - 1 VIEW COMPARISON:  None. FINDINGS: The bowel gas pattern is normal. No radio-opaque calculi or other significant radiographic  abnormality are seen. IMPRESSION: Negative. Electronically Signed   By: Constance Holster M.D.   On: 05/10/2020 15:58   CT L-SPINE NO CHARGE  Result Date: 05/10/2020 CLINICAL DATA:  Chest pain and back pain, aortic dissection suspected. Lower back pain worse on the right. Current everyday smoker. EXAM: CT ANGIOGRAPHY CHEST, ABDOMEN AND PELVIS CT LUMBAR SPINE WITHOUT CONTRAST TECHNIQUE: Non-contrast CT of the chest was initially obtained. Multidetector CT imaging through the chest, abdomen and pelvis was performed using the standard protocol during bolus administration of intravenous contrast. Multiplanar reconstructed images and MIPs were obtained and reviewed to evaluate the vascular anatomy. Multidetector CT imaging of the lumbar spine was performed without intravenous contrast administration. Multiplanar CT image reconstructions were also generated. CONTRAST:  126mL OMNIPAQUE IOHEXOL 350 MG/ML SOLN COMPARISON:  X-ray lumbar spine 05/10/2020. FINDINGS: VASCULAR Aorta: Aortic root calcifications noted. Extensive calcified and noncalcified atherosclerotic. There is greater than 50% narrowing of the descending thoracic aorta due to noncalcified plaque (5:44, 68) and similarly of the infrarenal abdominal aorta (5: 108). The ascending aorta is normal in caliber. There is aneurysmal dilatation of the distal descending thoracic aorta which measures up to 4 cm in caliber (8:140) and extends approximately 5 cm in the craniocaudal dimension. No associated periaortic fat stranding. No dissection or vasculitis of the thoracic aorta. No aneurysm, dissection, vasculitis of the abdominal aorta. Coronary arteries: Least mild four-vessel coronary artery calcifications. Pulmonary artery: The main pulmonary artery is normal in caliber. No central pulmonary embolus. Celiac: Mild atherosclerotic plaque. Patent without evidence of aneurysm, dissection, vasculitis or significant stenosis. SMA: Mild atherosclerotic plaque. Patent  without evidence of aneurysm, dissection, vasculitis or significant stenosis. Renals: Total of 2 left renal arteries. A single right renal artery noted. Mild atherosclerotic plaque. Patent without evidence of  aneurysm, dissection, vasculitis or significant stenosis. IMA: Mild atherosclerotic plaque. Patent without evidence of aneurysm, dissection, vasculitis or significant stenosis. Inflow: Moderate atherosclerotic plaque. Patent without evidence of aneurysm, dissection, vasculitis or significant stenosis. Veins: No obvious venous abnormality within the limitations of this arterial phase study. NON-VASCULAR CTA CHEST FINDINGS Cardiovascular: Preferential opacification of the thoracic aorta. Normal heart size. Suggestion of mild mitral annular calcifications. No significant pericardial effusion. Mediastinum/Nodes: There is an enlarged 1 cm right hilar lymph node (5:37). No left hilar lymphadenopathy. There is an enlarged precarinal lymph node measuring up to 1 cm (5:31). No axillary lymph nodes. Thyroid gland, trachea, and esophagus demonstrate no significant findings. Tiny hiatal hernia. Lungs/Pleura: Biapical pleural/pulmonary scarring. There is a spiculated 1.8 x 1.3 x 1.6 cm right upper lobe pulmonary nodule (6:56) with associated tethering of minor fissure. Right upper lobe subpleural micronodule (6:56). A 0.5 cm nodular like subpleural densities noted within the right apex (6:21, 8:145). No pulmonary mass. No focal consolidation. Bilateral lower lobe subsegmental atelectasis. Biapical pleural/pulmonary scarring. No pleural effusion. No pneumothorax. Musculoskeletal: No chest wall abnormality No suspicious lytic or blastic osseous lesions. No acute displaced rib fracture. Chronic appearing compression fracture of the T6 and T7 vertebral bodies. Multilevel degenerative changes of the spine. CTA ABDOMEN AND PELVIS FINDINGS Hepatobiliary: The liver is enlarged measuring up to 20 cm. No focal liver abnormality is  seen. Status post cholecystectomy. No biliary dilatation. Pancreas: No focal lesion. Normal pancreatic contour. No surrounding inflammatory changes. No main pancreatic ductal dilatation. Spleen: Heterogeneous appearance of the spleen consistent with arterial phase. Normal in size without focal abnormality. Adrenals/Urinary Tract: No adrenal nodule bilaterally. Bilateral kidneys enhance symmetrically. No hydronephrosis. No hydroureter. The urinary bladder is unremarkable. Stomach/Bowel: Stomach is within normal limits. No evidence of bowel wall thickening or dilatation. Sigmoid diverticulosis. The appendix is not definitely identified. Lymphatic: No lymphadenopathy. Reproductive: Status post hysterectomy. No adnexal masses. Other: No intraperitoneal free fluid. No intraperitoneal free gas. No organized fluid collection. Musculoskeletal: No abdominal wall hernia or abnormality. No suspicious lytic or blastic osseous lesions. No acute displaced fracture of the pelvis or sacrum. Multilevel degenerative changes of the spine. CT LUMPAR SPINE: Segmentation: 6 non-rib-bearing lumbar vertebral bodies. To keep with nomenclature for prior x-ray, lower most vertebra is numbered S1. Alignment: Similar-appearing grade 1 anterolisthesis of L5 on S1. Slight dextrocurvature of the lower lumbar spine. Vertebra/Intervertebral disc space: Similar-appearing severe degenerative changes at L2-L3 level and S1 level with intervertebral disc space vacuum phenomenon and endplate sclerosis. Facet arthropathy noted at the L5-S1 level. No severe neural foraminal stenosis. No severe central canal stenosis. Paraspinal and other soft tissues: Negative. Review of the MIP images confirms the above findings. IMPRESSION: VASCULAR: 1. 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 No CT findings to suggest imminent rupture. 2. Extensive calcified and noncalcified atherosclerotic plaque of the thoracic and abdominal  aorta with area of atherosclerotic plaque leading to greater than 50% narrowing of the lumen. Aortic Atherosclerosis (ICD10-I70.0). NONVASCULAR: 1. A 1.8 cm right upper lobe pulmonary nodule. Indeterminate right hilar and precarinal borderline enlarged lymphadenopathy. Findings concerning for malignancy. Additional imaging evaluation or consultation with Pulmonology or Thoracic Surgery recommended. 2. Hepatomegaly. 3. Sigmoid diverticulosis no acute diverticulitis. LUMPAR SPINE: 1. No acute displaced fracture or traumatic listhesis of the lumbar spine. Electronically Signed   By: Iven Finn M.D.   On: 05/10/2020 19:02   CT Angio Chest/Abd/Pel for Dissection W and/or Wo Contrast  Result Date: 05/10/2020 CLINICAL DATA:  Chest pain and back pain, aortic dissection suspected. Lower back pain worse on the right. Current everyday smoker. EXAM: CT ANGIOGRAPHY CHEST, ABDOMEN AND PELVIS CT LUMBAR SPINE WITHOUT CONTRAST TECHNIQUE: Non-contrast CT of the chest was initially obtained. Multidetector CT imaging through the chest, abdomen and pelvis was performed using the standard protocol during bolus administration of intravenous contrast. Multiplanar reconstructed images and MIPs were obtained and reviewed to evaluate the vascular anatomy. Multidetector CT imaging of the lumbar spine was performed without intravenous contrast administration. Multiplanar CT image reconstructions were also generated. CONTRAST:  179mL OMNIPAQUE IOHEXOL 350 MG/ML SOLN COMPARISON:  X-ray lumbar spine 05/10/2020. FINDINGS: VASCULAR Aorta: Aortic root calcifications noted. Extensive calcified and noncalcified atherosclerotic. There is greater than 50% narrowing of the descending thoracic aorta due to noncalcified plaque (5:44, 68) and similarly of the infrarenal abdominal aorta (5: 108). The ascending aorta is normal in caliber. There is aneurysmal dilatation of the distal descending thoracic aorta which measures up to 4 cm in caliber  (8:140) and extends approximately 5 cm in the craniocaudal dimension. No associated periaortic fat stranding. No dissection or vasculitis of the thoracic aorta. No aneurysm, dissection, vasculitis of the abdominal aorta. Coronary arteries: Least mild four-vessel coronary artery calcifications. Pulmonary artery: The main pulmonary artery is normal in caliber. No central pulmonary embolus. Celiac: Mild atherosclerotic plaque. Patent without evidence of aneurysm, dissection, vasculitis or significant stenosis. SMA: Mild atherosclerotic plaque. Patent without evidence of aneurysm, dissection, vasculitis or significant stenosis. Renals: Total of 2 left renal arteries. A single right renal artery noted. Mild atherosclerotic plaque. Patent without evidence of aneurysm, dissection, vasculitis or significant stenosis. IMA: Mild atherosclerotic plaque. Patent without evidence of aneurysm, dissection, vasculitis or significant stenosis. Inflow: Moderate atherosclerotic plaque. Patent without evidence of aneurysm, dissection, vasculitis or significant stenosis. Veins: No obvious venous abnormality within the limitations of this arterial phase study. NON-VASCULAR CTA CHEST FINDINGS Cardiovascular: Preferential opacification of the thoracic aorta. Normal heart size. Suggestion of mild mitral annular calcifications. No significant pericardial effusion. Mediastinum/Nodes: There is an enlarged 1 cm right hilar lymph node (5:37). No left hilar lymphadenopathy. There is an enlarged precarinal lymph node measuring up to 1 cm (5:31). No axillary lymph nodes. Thyroid gland, trachea, and esophagus demonstrate no significant findings. Tiny hiatal hernia. Lungs/Pleura: Biapical pleural/pulmonary scarring. There is a spiculated 1.8 x 1.3 x 1.6 cm right upper lobe pulmonary nodule (6:56) with associated tethering of minor fissure. Right upper lobe subpleural micronodule (6:56). A 0.5 cm nodular like subpleural densities noted within the right  apex (6:21, 8:145). No pulmonary mass. No focal consolidation. Bilateral lower lobe subsegmental atelectasis. Biapical pleural/pulmonary scarring. No pleural effusion. No pneumothorax. Musculoskeletal: No chest wall abnormality No suspicious lytic or blastic osseous lesions. No acute displaced rib fracture. Chronic appearing compression fracture of the T6 and T7 vertebral bodies. Multilevel degenerative changes of the spine. CTA ABDOMEN AND PELVIS FINDINGS Hepatobiliary: The liver is enlarged measuring up to 20 cm. No focal liver abnormality is seen. Status post cholecystectomy. No biliary dilatation. Pancreas: No focal lesion. Normal pancreatic contour. No surrounding inflammatory changes. No main pancreatic ductal dilatation. Spleen: Heterogeneous appearance of the spleen consistent with arterial phase. Normal in size without focal abnormality. Adrenals/Urinary Tract: No adrenal nodule bilaterally. Bilateral kidneys enhance symmetrically. No hydronephrosis. No hydroureter. The urinary bladder is unremarkable. Stomach/Bowel: Stomach is within normal limits. No evidence of bowel wall thickening or dilatation. Sigmoid diverticulosis. The appendix is not definitely identified. Lymphatic: No lymphadenopathy. Reproductive: Status post hysterectomy. No adnexal masses. Other:  No intraperitoneal free fluid. No intraperitoneal free gas. No organized fluid collection. Musculoskeletal: No abdominal wall hernia or abnormality. No suspicious lytic or blastic osseous lesions. No acute displaced fracture of the pelvis or sacrum. Multilevel degenerative changes of the spine. CT LUMPAR SPINE: Segmentation: 6 non-rib-bearing lumbar vertebral bodies. To keep with nomenclature for prior x-ray, lower most vertebra is numbered S1. Alignment: Similar-appearing grade 1 anterolisthesis of L5 on S1. Slight dextrocurvature of the lower lumbar spine. Vertebra/Intervertebral disc space: Similar-appearing severe degenerative changes at L2-L3  level and S1 level with intervertebral disc space vacuum phenomenon and endplate sclerosis. Facet arthropathy noted at the L5-S1 level. No severe neural foraminal stenosis. No severe central canal stenosis. Paraspinal and other soft tissues: Negative. Review of the MIP images confirms the above findings. IMPRESSION: VASCULAR: 1. 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 No CT findings to suggest imminent rupture. 2. Extensive calcified and noncalcified atherosclerotic plaque of the thoracic and abdominal aorta with area of atherosclerotic plaque leading to greater than 50% narrowing of the lumen. Aortic Atherosclerosis (ICD10-I70.0). NONVASCULAR: 1. A 1.8 cm right upper lobe pulmonary nodule. Indeterminate right hilar and precarinal borderline enlarged lymphadenopathy. Findings concerning for malignancy. Additional imaging evaluation or consultation with Pulmonology or Thoracic Surgery recommended. 2. Hepatomegaly. 3. Sigmoid diverticulosis no acute diverticulitis. LUMPAR SPINE: 1. No acute displaced fracture or traumatic listhesis of the lumbar spine. Electronically Signed   By: Iven Finn M.D.   On: 05/10/2020 19:02     Imaging studies reviewed and discussed with vascular surgery Dr. Delana Meyer.  Dr. Delana Meyer advises suspect unlikely that aneurysm is because of the patient's back pain, however does not advise overnight observation and vascular surgery consult which she will perform tomorrow.  Discussed with the patient and her daughter at bedside, she reports almost complete relief of pain at this time.  Resting comfortably.  Will admit for further care and treatment  Admission discussed with Dr. Myna Hidalgo of the hospitalist service, we also discussed concerning finding of pulmonary nodule that I discussed with both the patient and her daughter as well as hospitalist.  Admit for close observation for concern although relatively low at this point, but not 0 for  possible symptomatic aortic aneurysm   Delman Kitten, MD 05/10/20 1947

## 2020-05-10 NOTE — ED Provider Notes (Signed)
[facility] Emergency Department Provider Note  ___________________________________________   Event Date/Time   First MD Initiated Contact with Patient 05/10/20 1544     (approximate)  I have reviewed the triage vital signs and the nursing notes.   HISTORY  Chief Complaint Back Pain  HPI Marissa Fletcher is a 79 y.o. female with past medical history of hypertension and diabetes who presents to the emergency department with a family member for evaluation of acute back pain.  The patient states that the pain began suddenly on Saturday and was not associated with any fall, heavy lifting or other acute injury.  She denies any history of chronic back issues, denies any fever or loss of bowel or bladder control.  She states that the pain stays localized in the lower thoracic/upper lumbar region and does not radiate.  She denies any perceived weakness.  She reports that nothing makes the back pain better or worse.  Upon obtaining this history from the patient, the family member reports that her abdomen has been quite swollen, particularly over the last few days.  The patient reports regular bowel movements, denies any abdominal pain.  She was seen 7 days ago by her primary care who performed lab work and noted that her hemoglobin was low compared to prior evaluation in May 2021.  She denies any known source of blood loss, denies hematuria, hematochezia, hematemesis.  She denies any chest pain, shortness of breath or productive cough.  She is a roughly pack a day smoker, and has a greater than 40-pack-year history.  She does report that she has been having episodes of dizziness for quite some time, but denies that this has been acutely worse in the last 2 days.       Past Medical History:  Diagnosis Date  . Diabetes mellitus without complication ([place])   . Hypertension     Patient Active Problem List   Diagnosis Date Noted  . Back pain 05/10/2020  . Diabetes mellitus  without complication ([place])   . Hypertension   . Microcytic anemia   . Lung nodule seen on imaging study     Past Surgical History:  Procedure Laterality Date  . ABDOMINAL HYSTERECTOMY      Prior to Admission medications   Not on File    Allergies Sulfa antibiotics  Family History  Problem Relation Age of Onset  . Breast cancer Neg Hx     Social History Social History   Tobacco Use  . Smoking status: Current Every Day Smoker  . Smokeless tobacco: Never Used    Review of Systems Constitutional: No fever/chills Eyes: No visual changes. ENT: No sore throat. Cardiovascular: Denies chest pain. Respiratory: Denies shortness of breath. Gastrointestinal: + Abdominal distention, no abdominal pain.  No nausea, no vomiting.  No diarrhea.  No constipation. Genitourinary: Negative for dysuria. Musculoskeletal: + for back pain. Skin: Negative for rash. Neurological: + Dizziness, negative for headaches, focal weakness or numbness.   ____________________________________________   PHYSICAL EXAM:  VITAL SIGNS: ED Triage Vitals  Enc Vitals Group     BP 05/10/20 1347 (!) 106/50     Pulse Rate 05/10/20 1347 90     Resp 05/10/20 1347 18     Temp 05/10/20 1347 98.4 F (36.9 C)     Temp Source 05/10/20 1716 Oral     SpO2 05/10/20 1347 98 %     Weight 05/10/20 1348 160 lb (72.6 kg)     Height 05/10/20 1348 5\' 4"  (  1.626 m)     Head Circumference --      Peak Flow --      Pain Score 05/10/20 1348 1     Pain Loc --      Pain Edu? --      Excl. in [place]? --     Constitutional: Alert and oriented x4. Well appearing and in no acute distress. Eyes: Conjunctivae are normal. PERRL. EOMI. Head: Atraumatic. Nose: No congestion/rhinnorhea. Mouth/Throat: Mucous membranes are moist.   Neck: No stridor.   Cardiovascular: Normal rate, regular rhythm.  Trace bilateral lower extremity edema, no pitting.  Dorsal pedal pulses 1+, posterior tibial pulses 2+ bilaterally Respiratory: Normal  respiratory effort.  No retractions. Lungs CTAB. Gastrointestinal: Nontender.  Distention present.  No CVA tenderness. Musculoskeletal: Palpation of the thoracic and lumbar spine does not reproduce the patient's pain, no step-off deformities noted.  Neurologic:  Normal speech and language. No gross focal neurologic deficits are appreciated.  Skin:  Skin is warm, dry and intact. No rash noted. Psychiatric: Mood and affect are normal. Speech and behavior are normal.  ____________________________________________   LABS (all labs ordered are listed, but only abnormal results are displayed)  Labs Reviewed  URINALYSIS, ROUTINE W REFLEX MICROSCOPIC - Abnormal; Notable for the following components:      Result Value   Color, Urine YELLOW (*)    APPearance HAZY (*)    All other components within normal limits  COMPREHENSIVE METABOLIC PANEL - Abnormal; Notable for the following components:   Sodium 134 (*)    Glucose, Bld 117 (*)    Calcium 8.7 (*)    Albumin 3.4 (*)    All other components within normal limits  CBC WITH DIFFERENTIAL/PLATELET - Abnormal; Notable for the following components:   RBC 3.51 (*)    Hemoglobin 7.9 (*)    HCT 26.4 (*)    MCV 75.2 (*)    MCH 22.5 (*)    MCHC 29.9 (*)    RDW 17.5 (*)    All other components within normal limits  HEMOGLOBIN AND HEMATOCRIT, BLOOD - Abnormal; Notable for the following components:   Hemoglobin 7.8 (*)    HCT 26.2 (*)    All other components within normal limits  RESP PANEL BY RT-PCR (FLU A&B, COVID) ARPGX2  PROTIME-INR  APTT  TYPE AND SCREEN  TROPONIN I (HIGH SENSITIVITY)   ____________________________________________  EKG  See Dr. Malachi Bonds report. ____________________________________________  [place]  Official radiology report(s): DG Chest 2 View  Result Date: 05/10/2020 CLINICAL DATA:  Back pain EXAM: CHEST - 2 VIEW COMPARISON:  None. FINDINGS: Cardiomegaly. Atherosclerotic calcification of the aortic knob.  Coarsened interstitial markings throughout both lungs. Minimal linear opacity in the periphery of the right lung base. No pleural effusion or pneumothorax. Osseous structures appear mildly demineralized. No acute findings. IMPRESSION: 1. Minimal linear opacity in the periphery of the right lung base, likely atelectasis. 2. Cardiomegaly. Coarsened interstitial markings throughout both lungs, likely chronic. Electronically Signed   By: Davina Poke D.O.   On: 05/10/2020 15:58   DG Lumbar Spine 2-3 Views  Result Date: 05/10/2020 CLINICAL DATA:  79 year old female with back pain. EXAM: LUMBAR SPINE - 2-3 VIEW COMPARISON:  None. FINDINGS: Six lumbar type vertebra. The lowermost vertebra is numbered as S1. There is no acute fracture or subluxation of the lumbar spine. The bones are osteopenic. Multilevel degenerative changes with disc space narrowing and endplate irregularity primarily at L2-L3. There is grade 1 L5-S1 anterolisthesis. The visualized posterior elements  are intact. 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 The soft tissues are unremarkable. Right upper quadrant cholecystectomy clips. Small calcific densities in the left upper quadrant. There is atherosclerotic calcification of the aorta. The aorta is aneurysmal measuring 3.3 cm. IMPRESSION: 1. No acute fracture or subluxation of the lumbar spine. 2. Multilevel degenerative changes of the lumbar spine. 3. 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 Aortic Atherosclerosis (ICD10-I70.0). Electronically Signed   By: Anner Crete M.D.   On: 05/10/2020 15:55   DG Abdomen 1 View  Result Date: 05/10/2020 CLINICAL DATA:  Back pain. EXAM: ABDOMEN - 1 VIEW COMPARISON:  None. FINDINGS: The bowel gas pattern is normal. No radio-opaque calculi or other significant radiographic abnormality are seen. IMPRESSION: Negative. Electronically Signed   By: Constance Holster M.D.   On: 05/10/2020 15:58   CT L-SPINE NO CHARGE  Result Date: 05/10/2020 CLINICAL DATA:   Chest pain and back pain, aortic dissection suspected. Lower back pain worse on the right. Current everyday smoker. EXAM: CT ANGIOGRAPHY CHEST, ABDOMEN AND PELVIS CT LUMBAR SPINE WITHOUT CONTRAST TECHNIQUE: Non-contrast CT of the chest was initially obtained. Multidetector CT imaging through the chest, abdomen and pelvis was performed using the standard protocol during bolus administration of intravenous contrast. Multiplanar reconstructed images and MIPs were obtained and reviewed to evaluate the vascular anatomy. Multidetector CT imaging of the lumbar spine was performed without intravenous contrast administration. Multiplanar CT image reconstructions were also generated. CONTRAST:  166mL OMNIPAQUE IOHEXOL 350 MG/ML SOLN COMPARISON:  X-ray lumbar spine 05/10/2020. FINDINGS: VASCULAR Aorta: Aortic root calcifications noted. Extensive calcified and noncalcified atherosclerotic. There is greater than 50% narrowing of the descending thoracic aorta due to noncalcified plaque (5:44, 68) and similarly of the infrarenal abdominal aorta (5: 108). The ascending aorta is normal in caliber. There is aneurysmal dilatation of the distal descending thoracic aorta which measures up to 4 cm in caliber (8:140) and extends approximately 5 cm in the craniocaudal dimension. No associated periaortic fat stranding. No dissection or vasculitis of the thoracic aorta. No aneurysm, dissection, vasculitis of the abdominal aorta. Coronary arteries: Least mild four-vessel coronary artery calcifications. Pulmonary artery: The main pulmonary artery is normal in caliber. No central pulmonary embolus. Celiac: Mild atherosclerotic plaque. Patent without evidence of aneurysm, dissection, vasculitis or significant stenosis. SMA: Mild atherosclerotic plaque. Patent without evidence of aneurysm, dissection, vasculitis or significant stenosis. Renals: Total of 2 left renal arteries. A single right renal artery noted. Mild atherosclerotic plaque. Patent  without evidence of aneurysm, dissection, vasculitis or significant stenosis. IMA: Mild atherosclerotic plaque. Patent without evidence of aneurysm, dissection, vasculitis or significant stenosis. Inflow: Moderate atherosclerotic plaque. Patent without evidence of aneurysm, dissection, vasculitis or significant stenosis. Veins: No obvious venous abnormality within the limitations of this arterial phase study. NON-VASCULAR CTA CHEST FINDINGS Cardiovascular: Preferential opacification of the thoracic aorta. Normal heart size. Suggestion of mild mitral annular calcifications. No significant pericardial effusion. Mediastinum/Nodes: There is an enlarged 1 cm right hilar lymph node (5:37). No left hilar lymphadenopathy. There is an enlarged precarinal lymph node measuring up to 1 cm (5:31). No axillary lymph nodes. Thyroid gland, trachea, and esophagus demonstrate no significant findings. Tiny hiatal hernia. Lungs/Pleura: Biapical pleural/pulmonary scarring. There is a spiculated 1.8 x 1.3 x 1.6 cm right upper lobe pulmonary nodule (6:56) with associated tethering of minor fissure. Right upper lobe subpleural micronodule (6:56). A 0.5 cm nodular like subpleural densities noted within the right apex (6:21, 8:145). No pulmonary mass. No focal consolidation. Bilateral lower lobe subsegmental atelectasis. Biapical pleural/pulmonary scarring.  No pleural effusion. No pneumothorax. Musculoskeletal: No chest wall abnormality No suspicious lytic or blastic osseous lesions. No acute displaced rib fracture. Chronic appearing compression fracture of the T6 and T7 vertebral bodies. Multilevel degenerative changes of the spine. CTA ABDOMEN AND PELVIS FINDINGS Hepatobiliary: The liver is enlarged measuring up to 20 cm. No focal liver abnormality is seen. Status post cholecystectomy. No biliary dilatation. Pancreas: No focal lesion. Normal pancreatic contour. No surrounding inflammatory changes. No main pancreatic ductal dilatation.  Spleen: Heterogeneous appearance of the spleen consistent with arterial phase. Normal in size without focal abnormality. Adrenals/Urinary Tract: No adrenal nodule bilaterally. Bilateral kidneys enhance symmetrically. No hydronephrosis. No hydroureter. The urinary bladder is unremarkable. Stomach/Bowel: Stomach is within normal limits. No evidence of bowel wall thickening or dilatation. Sigmoid diverticulosis. The appendix is not definitely identified. Lymphatic: No lymphadenopathy. Reproductive: Status post hysterectomy. No adnexal masses. Other: No intraperitoneal free fluid. No intraperitoneal free gas. No organized fluid collection. Musculoskeletal: No abdominal wall hernia or abnormality. No suspicious lytic or blastic osseous lesions. No acute displaced fracture of the pelvis or sacrum. Multilevel degenerative changes of the spine. CT LUMPAR SPINE: Segmentation: 6 non-rib-bearing lumbar vertebral bodies. To keep with nomenclature for prior x-ray, lower most vertebra is numbered S1. Alignment: Similar-appearing grade 1 anterolisthesis of L5 on S1. Slight dextrocurvature of the lower lumbar spine. Vertebra/Intervertebral disc space: Similar-appearing severe degenerative changes at L2-L3 level and S1 level with intervertebral disc space vacuum phenomenon and endplate sclerosis. Facet arthropathy noted at the L5-S1 level. No severe neural foraminal stenosis. No severe central canal stenosis. Paraspinal and other soft tissues: Negative. Review of the MIP images confirms the above findings. IMPRESSION: VASCULAR: 1. 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 No CT findings to suggest imminent rupture. 2. Extensive calcified and noncalcified atherosclerotic plaque of the thoracic and abdominal aorta with area of atherosclerotic plaque leading to greater than 50% narrowing of the lumen. Aortic Atherosclerosis (ICD10-I70.0). NONVASCULAR: 1. A 1.8 cm right upper lobe pulmonary  nodule. Indeterminate right hilar and precarinal borderline enlarged lymphadenopathy. Findings concerning for malignancy. Additional imaging evaluation or consultation with Pulmonology or Thoracic Surgery recommended. 2. Hepatomegaly. 3. Sigmoid diverticulosis no acute diverticulitis. LUMPAR SPINE: 1. No acute displaced fracture or traumatic listhesis of the lumbar spine. Electronically Signed   By: Iven Finn M.D.   On: 05/10/2020 19:02   CT Angio Chest/Abd/Pel for Dissection W and/or Wo Contrast  Result Date: 05/10/2020 CLINICAL DATA:  Chest pain and back pain, aortic dissection suspected. Lower back pain worse on the right. Current everyday smoker. EXAM: CT ANGIOGRAPHY CHEST, ABDOMEN AND PELVIS CT LUMBAR SPINE WITHOUT CONTRAST TECHNIQUE: Non-contrast CT of the chest was initially obtained. Multidetector CT imaging through the chest, abdomen and pelvis was performed using the standard protocol during bolus administration of intravenous contrast. Multiplanar reconstructed images and MIPs were obtained and reviewed to evaluate the vascular anatomy. Multidetector CT imaging of the lumbar spine was performed without intravenous contrast administration. Multiplanar CT image reconstructions were also generated. CONTRAST:  143mL OMNIPAQUE IOHEXOL 350 MG/ML SOLN COMPARISON:  X-ray lumbar spine 05/10/2020. FINDINGS: VASCULAR Aorta: Aortic root calcifications noted. Extensive calcified and noncalcified atherosclerotic. There is greater than 50% narrowing of the descending thoracic aorta due to noncalcified plaque (5:44, 68) and similarly of the infrarenal abdominal aorta (5: 108). The ascending aorta is normal in caliber. There is aneurysmal dilatation of the distal descending thoracic aorta which measures up to 4 cm in caliber (8:140) and extends approximately  5 cm in the craniocaudal dimension. No associated periaortic fat stranding. No dissection or vasculitis of the thoracic aorta. No aneurysm, dissection,  vasculitis of the abdominal aorta. Coronary arteries: Least mild four-vessel coronary artery calcifications. Pulmonary artery: The main pulmonary artery is normal in caliber. No central pulmonary embolus. Celiac: Mild atherosclerotic plaque. Patent without evidence of aneurysm, dissection, vasculitis or significant stenosis. SMA: Mild atherosclerotic plaque. Patent without evidence of aneurysm, dissection, vasculitis or significant stenosis. Renals: Total of 2 left renal arteries. A single right renal artery noted. Mild atherosclerotic plaque. Patent without evidence of aneurysm, dissection, vasculitis or significant stenosis. IMA: Mild atherosclerotic plaque. Patent without evidence of aneurysm, dissection, vasculitis or significant stenosis. Inflow: Moderate atherosclerotic plaque. Patent without evidence of aneurysm, dissection, vasculitis or significant stenosis. Veins: No obvious venous abnormality within the limitations of this arterial phase study. NON-VASCULAR CTA CHEST FINDINGS Cardiovascular: Preferential opacification of the thoracic aorta. Normal heart size. Suggestion of mild mitral annular calcifications. No significant pericardial effusion. Mediastinum/Nodes: There is an enlarged 1 cm right hilar lymph node (5:37). No left hilar lymphadenopathy. There is an enlarged precarinal lymph node measuring up to 1 cm (5:31). No axillary lymph nodes. Thyroid gland, trachea, and esophagus demonstrate no significant findings. Tiny hiatal hernia. Lungs/Pleura: Biapical pleural/pulmonary scarring. There is a spiculated 1.8 x 1.3 x 1.6 cm right upper lobe pulmonary nodule (6:56) with associated tethering of minor fissure. Right upper lobe subpleural micronodule (6:56). A 0.5 cm nodular like subpleural densities noted within the right apex (6:21, 8:145). No pulmonary mass. No focal consolidation. Bilateral lower lobe subsegmental atelectasis. Biapical pleural/pulmonary scarring. No pleural effusion. No pneumothorax.  Musculoskeletal: No chest wall abnormality No suspicious lytic or blastic osseous lesions. No acute displaced rib fracture. Chronic appearing compression fracture of the T6 and T7 vertebral bodies. Multilevel degenerative changes of the spine. CTA ABDOMEN AND PELVIS FINDINGS Hepatobiliary: The liver is enlarged measuring up to 20 cm. No focal liver abnormality is seen. Status post cholecystectomy. No biliary dilatation. Pancreas: No focal lesion. Normal pancreatic contour. No surrounding inflammatory changes. No main pancreatic ductal dilatation. Spleen: Heterogeneous appearance of the spleen consistent with arterial phase. Normal in size without focal abnormality. Adrenals/Urinary Tract: No adrenal nodule bilaterally. Bilateral kidneys enhance symmetrically. No hydronephrosis. No hydroureter. The urinary bladder is unremarkable. Stomach/Bowel: Stomach is within normal limits. No evidence of bowel wall thickening or dilatation. Sigmoid diverticulosis. The appendix is not definitely identified. Lymphatic: No lymphadenopathy. Reproductive: Status post hysterectomy. No adnexal masses. Other: No intraperitoneal free fluid. No intraperitoneal free gas. No organized fluid collection. Musculoskeletal: No abdominal wall hernia or abnormality. No suspicious lytic or blastic osseous lesions. No acute displaced fracture of the pelvis or sacrum. Multilevel degenerative changes of the spine. CT LUMPAR SPINE: Segmentation: 6 non-rib-bearing lumbar vertebral bodies. To keep with nomenclature for prior x-ray, lower most vertebra is numbered S1. Alignment: Similar-appearing grade 1 anterolisthesis of L5 on S1. Slight dextrocurvature of the lower lumbar spine. Vertebra/Intervertebral disc space: Similar-appearing severe degenerative changes at L2-L3 level and S1 level with intervertebral disc space vacuum phenomenon and endplate sclerosis. Facet arthropathy noted at the L5-S1 level. No severe neural foraminal stenosis. No severe  central canal stenosis. Paraspinal and other soft tissues: Negative. Review of the MIP images confirms the above findings. IMPRESSION: VASCULAR: 1. 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 No CT findings to suggest imminent rupture. 2. Extensive calcified and noncalcified atherosclerotic plaque of the thoracic and abdominal aorta with area of atherosclerotic plaque  leading to greater than 50% narrowing of the lumen. Aortic Atherosclerosis (ICD10-I70.0). NONVASCULAR: 1. A 1.8 cm right upper lobe pulmonary nodule. Indeterminate right hilar and precarinal borderline enlarged lymphadenopathy. Findings concerning for malignancy. Additional imaging evaluation or consultation with Pulmonology or Thoracic Surgery recommended. 2. Hepatomegaly. 3. Sigmoid diverticulosis no acute diverticulitis. LUMPAR SPINE: 1. No acute displaced fracture or traumatic listhesis of the lumbar spine. Electronically Signed   By: Iven Finn M.D.   On: 05/10/2020 19:02    ____________________________________________   INITIAL IMPRESSION / ASSESSMENT AND PLAN / ED COURSE  As part of my medical decision making, I reviewed the following data within the [place] History obtained from family, Nursing notes reviewed and incorporated, Labs reviewed, Old chart reviewed, Patient signed out to Dr. Jacqualine Code, Radiograph reviewed, Evaluated by EM attending Dr. Jacqualine Code and Notes from prior ED visits        Patient is a 79 year old female who presents to the emergency department for evaluation of acute onset 2 days ago of back pain.  Family also reports abdominal distention began around the same time.  On physical exam, the patient is not tachypneic, tachycardic or febrile.  Her O2 sats are 98%, however the patient is mildly hypotensive at 106/50.  The patient's back pain is unable to be reproduced by palpation, she maintains 5/5 strength in the bilateral lower extremities without any  focal neurological findings.  Exam of the abdomen is nontender, however there is clear distention present.  Laboratory evaluation began with chest x-ray, abdominal x-ray and lumbar plain films.  There was concern on lumbar films for abdominal aortic aneurysm measuring 3.5 cm.  While there was degenerative changes present in the lumbar spine, there was no clear finding to indicate the cause of the patient's lumbar back pain or abdominal distention.  Evaluation proceeded with a CMP, CBC which was significant for hemoglobin decreased to 7.9.  Further review of the patient's chart revealed that she was evaluated by primary care on 12/13 at which time labs were drawn and hemoglobin at that time was 8.9.  At this time, the case was discussed with Dr. Jacqualine Code, who personally came to evaluate the patient.  Bedside ultrasound was performed to ensure that the patient did not have free fluid in the abdomen.  Plan is to proceed with further imaging of the chest abdomen and pelvis to rule out aortic dissection as the cause of patient's symptoms.  Further labs will also be drawn to include troponin, and EKG was ordered.  At this time, the patient was transferred to Dr. Malachi Bonds care with plans to await CT report and discuss findings with vascular.      ____________________________________________   FINAL CLINICAL IMPRESSION(S) / ED DIAGNOSES  Final diagnoses:  Back pain  AAA (abdominal aortic aneurysm) without rupture [facility])  Pulmonary nodule     ED Discharge Orders    None      *Please note:  Marissa Fletcher was evaluated in Emergency Department on 05/10/2020 for the symptoms described in the history of present illness. She was evaluated in the context of the global COVID-19 pandemic, which necessitated consideration that the patient might be at risk for infection with the SARS-CoV-2 virus that causes COVID-19. Institutional protocols and algorithms that pertain to the evaluation of patients at risk for  COVID-19 are in a state of rapid change based on information released by regulatory bodies including the CDC and federal and state organizations. These policies and algorithms were followed during the  patient's care in the ED.  Some ED evaluations and interventions may be delayed as a result of limited staffing during and the pandemic.*   Note:  This document was prepared using Dragon voice recognition software and may include unintentional dictation errors.    Marlana Salvage, PA 05/10/20 Colbert Coyer    Delman Kitten, MD 05/10/20 [phone]

## 2020-05-11 DIAGNOSIS — E876 Hypokalemia: Secondary | ICD-10-CM | POA: Diagnosis present

## 2020-05-11 DIAGNOSIS — E1140 Type 2 diabetes mellitus with diabetic neuropathy, unspecified: Secondary | ICD-10-CM | POA: Diagnosis present

## 2020-05-11 DIAGNOSIS — R911 Solitary pulmonary nodule: Secondary | ICD-10-CM | POA: Diagnosis present

## 2020-05-11 DIAGNOSIS — M549 Dorsalgia, unspecified: Secondary | ICD-10-CM

## 2020-05-11 DIAGNOSIS — I714 Abdominal aortic aneurysm, without rupture: Secondary | ICD-10-CM

## 2020-05-11 DIAGNOSIS — Z79899 Other long term (current) drug therapy: Secondary | ICD-10-CM | POA: Diagnosis not present

## 2020-05-11 DIAGNOSIS — F419 Anxiety disorder, unspecified: Secondary | ICD-10-CM | POA: Diagnosis present

## 2020-05-11 DIAGNOSIS — G8929 Other chronic pain: Secondary | ICD-10-CM | POA: Diagnosis present

## 2020-05-11 DIAGNOSIS — Z7984 Long term (current) use of oral hypoglycemic drugs: Secondary | ICD-10-CM | POA: Diagnosis not present

## 2020-05-11 DIAGNOSIS — Z882 Allergy status to sulfonamides status: Secondary | ICD-10-CM | POA: Diagnosis not present

## 2020-05-11 DIAGNOSIS — I712 Thoracic aortic aneurysm, without rupture: Secondary | ICD-10-CM | POA: Diagnosis present

## 2020-05-11 DIAGNOSIS — M546 Pain in thoracic spine: Secondary | ICD-10-CM | POA: Diagnosis not present

## 2020-05-11 DIAGNOSIS — F32A Depression, unspecified: Secondary | ICD-10-CM | POA: Diagnosis present

## 2020-05-11 DIAGNOSIS — Z20822 Contact with and (suspected) exposure to covid-19: Secondary | ICD-10-CM | POA: Diagnosis present

## 2020-05-11 DIAGNOSIS — M47816 Spondylosis without myelopathy or radiculopathy, lumbar region: Secondary | ICD-10-CM | POA: Diagnosis present

## 2020-05-11 DIAGNOSIS — D509 Iron deficiency anemia, unspecified: Secondary | ICD-10-CM | POA: Diagnosis present

## 2020-05-11 DIAGNOSIS — F17210 Nicotine dependence, cigarettes, uncomplicated: Secondary | ICD-10-CM | POA: Diagnosis present

## 2020-05-11 DIAGNOSIS — E1169 Type 2 diabetes mellitus with other specified complication: Secondary | ICD-10-CM | POA: Diagnosis present

## 2020-05-11 DIAGNOSIS — I119 Hypertensive heart disease without heart failure: Secondary | ICD-10-CM | POA: Diagnosis present

## 2020-05-11 DIAGNOSIS — E785 Hyperlipidemia, unspecified: Secondary | ICD-10-CM | POA: Diagnosis present

## 2020-05-11 DIAGNOSIS — J449 Chronic obstructive pulmonary disease, unspecified: Secondary | ICD-10-CM | POA: Diagnosis present

## 2020-05-11 LAB — GLUCOSE, CAPILLARY
Glucose-Capillary: 108 mg/dL — ABNORMAL HIGH (ref 70–99)
Glucose-Capillary: 112 mg/dL — ABNORMAL HIGH (ref 70–99)
Glucose-Capillary: 133 mg/dL — ABNORMAL HIGH (ref 70–99)
Glucose-Capillary: 146 mg/dL — ABNORMAL HIGH (ref 70–99)
Glucose-Capillary: 200 mg/dL — ABNORMAL HIGH (ref 70–99)
Glucose-Capillary: 99 mg/dL (ref 70–99)

## 2020-05-11 LAB — IRON AND TIBC
Iron: 14 ug/dL — ABNORMAL LOW (ref 28–170)
Saturation Ratios: 4 % — ABNORMAL LOW (ref 10.4–31.8)
TIBC: 382 ug/dL (ref 250–450)
UIBC: 368 ug/dL

## 2020-05-11 LAB — FERRITIN: Ferritin: 9 ng/mL — ABNORMAL LOW (ref 11–307)

## 2020-05-11 LAB — BASIC METABOLIC PANEL
Anion gap: 10 (ref 5–15)
BUN: 10 mg/dL (ref 8–23)
CO2: 26 mmol/L (ref 22–32)
Calcium: 8.6 mg/dL — ABNORMAL LOW (ref 8.9–10.3)
Chloride: 101 mmol/L (ref 98–111)
Creatinine, Ser: 0.75 mg/dL (ref 0.44–1.00)
GFR, Estimated: >60 mL/min (ref >60)
Glucose, Bld: 111 mg/dL — ABNORMAL HIGH (ref 70–99)
Potassium: 3.4 mmol/L — ABNORMAL LOW (ref 3.5–5.1)
Sodium: 137 mmol/L (ref 135–145)

## 2020-05-11 LAB — CBC
HCT: 26.2 % — ABNORMAL LOW (ref 36.0–46.0)
Hemoglobin: 8 g/dL — ABNORMAL LOW (ref 12.0–15.0)
MCH: 23.1 pg — ABNORMAL LOW (ref 26.0–34.0)
MCHC: 30.5 g/dL (ref 30.0–36.0)
MCV: 75.7 fL — ABNORMAL LOW (ref 80.0–100.0)
Platelets: 349 10*3/uL (ref 150–400)
RBC: 3.46 MIL/uL — ABNORMAL LOW (ref 3.87–5.11)
RDW: 17.6 % — ABNORMAL HIGH (ref 11.5–15.5)
WBC: 9.1 10*3/uL (ref 4.0–10.5)
nRBC: 0 % (ref 0.0–0.2)

## 2020-05-11 LAB — VITAMIN B12: Vitamin B-12: 3773 pg/mL — ABNORMAL HIGH (ref 180–914)

## 2020-05-11 LAB — FOLATE: Folate: 33 ng/mL (ref >5.9)

## 2020-05-11 MED ORDER — LIDOCAINE 5 % EX PTCH
1.0000 | MEDICATED_PATCH | CUTANEOUS | Status: DC
  Administered 2020-05-11 – 2020-05-12 (×2): 1 via TRANSDERMAL
  Filled 2020-05-11 (×3): qty 1

## 2020-05-11 MED ORDER — POLYETHYLENE GLYCOL 3350 17 G PO PACK
17.0000 g | PACK | Freq: Every day | ORAL | Status: DC
  Administered 2020-05-11 – 2020-05-12 (×2): 17 g via ORAL
  Filled 2020-05-11 (×2): qty 1

## 2020-05-11 MED ORDER — SENNOSIDES-DOCUSATE SODIUM 8.6-50 MG PO TABS
1.0000 | ORAL_TABLET | Freq: Two times a day (BID) | ORAL | Status: DC
  Administered 2020-05-11 – 2020-05-12 (×2): 1 via ORAL
  Filled 2020-05-11 (×2): qty 1

## 2020-05-11 MED ORDER — METHOCARBAMOL 1000 MG/10ML IJ SOLN
500.0000 mg | Freq: Four times a day (QID) | INTRAVENOUS | Status: DC | PRN
  Filled 2020-05-11 (×2): qty 5

## 2020-05-11 MED ORDER — SODIUM CHLORIDE 0.9 % IV SOLN
510.0000 mg | Freq: Once | INTRAVENOUS | Status: AC
  Administered 2020-05-11: 510 mg via INTRAVENOUS
  Filled 2020-05-11: qty 17

## 2020-05-11 MED ORDER — HYDROMORPHONE HCL 1 MG/ML IJ SOLN
0.5000 mg | INTRAMUSCULAR | Status: DC | PRN
Start: 2020-05-11 — End: 2020-05-12

## 2020-05-11 MED ORDER — POTASSIUM CHLORIDE CRYS ER 20 MEQ PO TBCR
40.0000 meq | EXTENDED_RELEASE_TABLET | Freq: Once | ORAL | Status: AC
  Administered 2020-05-11: 40 meq via ORAL
  Filled 2020-05-11: qty 2

## 2020-05-11 NOTE — Consult Note (Signed)
@LOGO @   MRN : [id number]  Marissa Fletcher is a 79 y.o. ([birth year]) female who presents with chief complaint of  Chief Complaint  Patient presents with  . Back Pain  .  History of Present Illness:   I have asked to evaluate the patient by Dr. Jacqualine Code.  Patient is an 79 year old woman who came to the emergency room yesterday with worsening back pain.  On discussion she notes this began abruptly while she was mopping her floor.  She notes that it is predominantly on the right.  She states that it feels like a intense cramping rather than her usual aching stiff back pain.  She knows that she has a significant amount of degenerative disease in her back and is struggled with back problems for many years.  In the emergency room a CT scan of the chest abdomen pelvis was obtained which is reviewed by me personally and demonstrates multiple significant findings including diffuse profound atheromatous changes of her entire descending thoracic aorta and abdominal aorta.  These findings are associated with 2 small aneurysms 1 in the descending aorta and one in the infrarenal aorta.  There is no evidence of inflammatory changes surrounding the aorta there is no evidence of dissection there is no evidence of rupture.  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  And lastly rather severe multilevel degenerative changes are identified.  Current Meds  Medication Sig  . atorvastatin (LIPITOR) 20 MG tablet Take 20 mg by mouth daily.  Marland Kitchen doxepin (SINEQUAN) 25 MG capsule Take 50-75 mg by mouth at bedtime.  . gabapentin (NEURONTIN) 300 MG capsule Take 300 mg by mouth daily.  Marland Kitchen lisinopril-hydrochlorothiazide (ZESTORETIC) 20-12.5 MG tablet Take 1 tablet by mouth daily.  . metFORMIN (GLUCOPHAGE-XR) 500 MG 24 hr tablet Take 500 mg by mouth 2 (two) times daily.  Marland Kitchen PARoxetine (PAXIL) 30 MG tablet Take 30 mg by mouth daily.    Past Medical History:  Diagnosis Date  . Diabetes  mellitus without complication ([place])   . Hypertension     Past Surgical History:  Procedure Laterality Date  . ABDOMINAL HYSTERECTOMY      Social History Social History   Tobacco Use  . Smoking status: Current Every Day Smoker    Packs/day: 0.50  . Smokeless tobacco: Never Used  Substance Use Topics  . Alcohol use: Never  . Drug use: Never    Family History Family History  Problem Relation Age of Onset  . Breast cancer Neg Hx     Allergies  Allergen Reactions  . Sulfa Antibiotics Swelling     REVIEW OF SYSTEMS (Negative unless checked)  Constitutional: [] Weight loss  [] Fever  [] Chills Cardiac: [] Chest pain   [] Chest pressure   [] Palpitations   [] Shortness of breath when laying flat   [] Shortness of breath with exertion. Vascular:  [] Pain in legs with walking   [] Pain in legs at rest  [] History of DVT   [] Phlebitis   [] Swelling in legs   [] Varicose veins   [] Non-healing ulcers Pulmonary:   [] Uses home oxygen   [] Productive cough   [] Hemoptysis   [] Wheeze  [] COPD   [] Asthma Neurologic:  [] Dizziness   [] Seizures   [] History of stroke   [] History of TIA  [] Aphasia   [] Vissual changes   [] Weakness or numbness in arm   [] Weakness or numbness in leg Musculoskeletal:   [] Joint swelling   [x] Joint pain   [x] Low back pain Hematologic:  [] Easy bruising  [] Easy  bleeding   [] Hypercoagulable state   [] Anemic Gastrointestinal:  [] Diarrhea   [] Vomiting  [] Gastroesophageal reflux/heartburn   [] Difficulty swallowing. Genitourinary:  [] Chronic kidney disease   [] Difficult urination  [] Frequent urination   [] Blood in urine Skin:  [] Rashes   [] Ulcers  Psychological:  [] History of anxiety   []  History of major depression.  Physical Examination  Vitals:   05/11/20 0444 05/11/20 0745 05/11/20 1159 05/11/20 1518  BP:  130/74 (!) 123/55 (!) 143/58  Pulse: 89 92 92 87  Resp:  16 18 16   Temp:  98.6 F (37 C) 98.2 F (36.8 C) 98.2 F (36.8 C)  TempSrc:  Oral    SpO2: 94% 99% 90% 90%   Weight:      Height:       Body mass index is 27.46 kg/m. Gen: WD/WN, NAD Head: [place]/AT, No temporalis wasting.  Ear/Nose/Throat: Hearing grossly intact, nares w/o erythema or drainage Eyes: PER, EOMI, sclera nonicteric.  Neck: Supple, no large masses.   Pulmonary:  Good air movement, no audible wheezing bilaterally, no use of accessory muscles.  Cardiac: RRR, no JVD Vascular: Both feet are warm and there is no evidence of atheroemboli. Vessel Right Left  Radial Palpable Palpable  PT Palpable Palpable  DP  trace palpable  trace palpable  Gastrointestinal: Non-distended. No guarding/no peritoneal signs.  Musculoskeletal: M/S 5/5 throughout.  No deformity or atrophy.  Neurologic: CN 2-12 intact. Symmetrical.  Speech is fluent. Motor exam as listed above. Psychiatric: Judgment intact, Mood & affect appropriate for pt's clinical situation. Dermatologic: No rashes or ulcers noted.  No changes consistent with cellulitis.  CBC Lab Results  Component Value Date   WBC 9.1 05/11/2020   HGB 8.0 (L) 05/11/2020   HCT 26.2 (L) 05/11/2020   MCV 75.7 (L) 05/11/2020   PLT 349 05/11/2020    BMET    Component Value Date/Time   NA 137 05/11/2020 0422   K 3.4 (L) 05/11/2020 0422   CL 101 05/11/2020 0422   CO2 26 05/11/2020 0422   GLUCOSE 111 (H) 05/11/2020 0422   BUN 10 05/11/2020 0422   CREATININE 0.75 05/11/2020 0422   CALCIUM 8.6 (L) 05/11/2020 0422   GFRNONAA >60 05/11/2020 0422   Estimated Creatinine Clearance: 54.8 mL/min (by C-G formula based on SCr of 0.75 mg/dL).  COAG Lab Results  Component Value Date   INR 1.0 05/10/2020    Radiology DG Chest 2 View  Result Date: 05/10/2020 CLINICAL DATA:  Back pain EXAM: CHEST - 2 VIEW COMPARISON:  None. FINDINGS: Cardiomegaly. Atherosclerotic calcification of the aortic knob. Coarsened interstitial markings throughout both lungs. Minimal linear opacity in the periphery of the right lung base. No pleural effusion or pneumothorax.  Osseous structures appear mildly demineralized. No acute findings. IMPRESSION: 1. Minimal linear opacity in the periphery of the right lung base, likely atelectasis. 2. Cardiomegaly. Coarsened interstitial markings throughout both lungs, likely chronic. Electronically Signed   By: Davina Poke D.O.   On: 05/10/2020 15:58   DG Lumbar Spine 2-3 Views  Result Date: 05/10/2020 CLINICAL DATA:  79 year old female with back pain. EXAM: LUMBAR SPINE - 2-3 VIEW COMPARISON:  None. FINDINGS: Six lumbar type vertebra. The lowermost vertebra is numbered as S1. There is no acute fracture or subluxation of the lumbar spine. The bones are osteopenic. Multilevel degenerative changes with disc space narrowing and endplate irregularity primarily at L2-L3. There is grade 1 L5-S1 anterolisthesis. The visualized posterior elements are intact. 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 The soft tissues are unremarkable.  Right upper quadrant cholecystectomy clips. Small calcific densities in the left upper quadrant. There is atherosclerotic calcification of the aorta. The aorta is aneurysmal measuring 3.3 cm. IMPRESSION: 1. No acute fracture or subluxation of the lumbar spine. 2. Multilevel degenerative changes of the lumbar spine. 3. 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 Aortic Atherosclerosis (ICD10-I70.0). Electronically Signed   By: Anner Crete M.D.   On: 05/10/2020 15:55   DG Abdomen 1 View  Result Date: 05/10/2020 CLINICAL DATA:  Back pain. EXAM: ABDOMEN - 1 VIEW COMPARISON:  None. FINDINGS: The bowel gas pattern is normal. No radio-opaque calculi or other significant radiographic abnormality are seen. IMPRESSION: Negative. Electronically Signed   By: Constance Holster M.D.   On: 05/10/2020 15:58   CT L-SPINE NO CHARGE  Result Date: 05/10/2020 CLINICAL DATA:  Chest pain and back pain, aortic dissection suspected. Lower back pain worse on the right. Current everyday smoker. EXAM: CT ANGIOGRAPHY CHEST, ABDOMEN AND  PELVIS CT LUMBAR SPINE WITHOUT CONTRAST TECHNIQUE: Non-contrast CT of the chest was initially obtained. Multidetector CT imaging through the chest, abdomen and pelvis was performed using the standard protocol during bolus administration of intravenous contrast. Multiplanar reconstructed images and MIPs were obtained and reviewed to evaluate the vascular anatomy. Multidetector CT imaging of the lumbar spine was performed without intravenous contrast administration. Multiplanar CT image reconstructions were also generated. CONTRAST:  166mL OMNIPAQUE IOHEXOL 350 MG/ML SOLN COMPARISON:  X-ray lumbar spine 05/10/2020. FINDINGS: VASCULAR Aorta: Aortic root calcifications noted. Extensive calcified and noncalcified atherosclerotic. There is greater than 50% narrowing of the descending thoracic aorta due to noncalcified plaque (5:44, 68) and similarly of the infrarenal abdominal aorta (5: 108). The ascending aorta is normal in caliber. There is aneurysmal dilatation of the distal descending thoracic aorta which measures up to 4 cm in caliber (8:140) and extends approximately 5 cm in the craniocaudal dimension. No associated periaortic fat stranding. No dissection or vasculitis of the thoracic aorta. No aneurysm, dissection, vasculitis of the abdominal aorta. Coronary arteries: Least mild four-vessel coronary artery calcifications. Pulmonary artery: The main pulmonary artery is normal in caliber. No central pulmonary embolus. Celiac: Mild atherosclerotic plaque. Patent without evidence of aneurysm, dissection, vasculitis or significant stenosis. SMA: Mild atherosclerotic plaque. Patent without evidence of aneurysm, dissection, vasculitis or significant stenosis. Renals: Total of 2 left renal arteries. A single right renal artery noted. Mild atherosclerotic plaque. Patent without evidence of aneurysm, dissection, vasculitis or significant stenosis. IMA: Mild atherosclerotic plaque. Patent without evidence of aneurysm,  dissection, vasculitis or significant stenosis. Inflow: Moderate atherosclerotic plaque. Patent without evidence of aneurysm, dissection, vasculitis or significant stenosis. Veins: No obvious venous abnormality within the limitations of this arterial phase study. NON-VASCULAR CTA CHEST FINDINGS Cardiovascular: Preferential opacification of the thoracic aorta. Normal heart size. Suggestion of mild mitral annular calcifications. No significant pericardial effusion. Mediastinum/Nodes: There is an enlarged 1 cm right hilar lymph node (5:37). No left hilar lymphadenopathy. There is an enlarged precarinal lymph node measuring up to 1 cm (5:31). No axillary lymph nodes. Thyroid gland, trachea, and esophagus demonstrate no significant findings. Tiny hiatal hernia. Lungs/Pleura: Biapical pleural/pulmonary scarring. There is a spiculated 1.8 x 1.3 x 1.6 cm right upper lobe pulmonary nodule (6:56) with associated tethering of minor fissure. Right upper lobe subpleural micronodule (6:56). A 0.5 cm nodular like subpleural densities noted within the right apex (6:21, 8:145). No pulmonary mass. No focal consolidation. Bilateral lower lobe subsegmental atelectasis. Biapical pleural/pulmonary scarring. No pleural effusion. No pneumothorax. Musculoskeletal: No chest wall abnormality No  suspicious lytic or blastic osseous lesions. No acute displaced rib fracture. Chronic appearing compression fracture of the T6 and T7 vertebral bodies. Multilevel degenerative changes of the spine. CTA ABDOMEN AND PELVIS FINDINGS Hepatobiliary: The liver is enlarged measuring up to 20 cm. No focal liver abnormality is seen. Status post cholecystectomy. No biliary dilatation. Pancreas: No focal lesion. Normal pancreatic contour. No surrounding inflammatory changes. No main pancreatic ductal dilatation. Spleen: Heterogeneous appearance of the spleen consistent with arterial phase. Normal in size without focal abnormality. Adrenals/Urinary Tract: No  adrenal nodule bilaterally. Bilateral kidneys enhance symmetrically. No hydronephrosis. No hydroureter. The urinary bladder is unremarkable. Stomach/Bowel: Stomach is within normal limits. No evidence of bowel wall thickening or dilatation. Sigmoid diverticulosis. The appendix is not definitely identified. Lymphatic: No lymphadenopathy. Reproductive: Status post hysterectomy. No adnexal masses. Other: No intraperitoneal free fluid. No intraperitoneal free gas. No organized fluid collection. Musculoskeletal: No abdominal wall hernia or abnormality. No suspicious lytic or blastic osseous lesions. No acute displaced fracture of the pelvis or sacrum. Multilevel degenerative changes of the spine. CT LUMPAR SPINE: Segmentation: 6 non-rib-bearing lumbar vertebral bodies. To keep with nomenclature for prior x-ray, lower most vertebra is numbered S1. Alignment: Similar-appearing grade 1 anterolisthesis of L5 on S1. Slight dextrocurvature of the lower lumbar spine. Vertebra/Intervertebral disc space: Similar-appearing severe degenerative changes at L2-L3 level and S1 level with intervertebral disc space vacuum phenomenon and endplate sclerosis. Facet arthropathy noted at the L5-S1 level. No severe neural foraminal stenosis. No severe central canal stenosis. Paraspinal and other soft tissues: Negative. Review of the MIP images confirms the above findings. IMPRESSION: VASCULAR: 1. 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 No CT findings to suggest imminent rupture. 2. Extensive calcified and noncalcified atherosclerotic plaque of the thoracic and abdominal aorta with area of atherosclerotic plaque leading to greater than 50% narrowing of the lumen. Aortic Atherosclerosis (ICD10-I70.0). NONVASCULAR: 1. A 1.8 cm right upper lobe pulmonary nodule. Indeterminate right hilar and precarinal borderline enlarged lymphadenopathy. Findings concerning for malignancy. Additional imaging  evaluation or consultation with Pulmonology or Thoracic Surgery recommended. 2. Hepatomegaly. 3. Sigmoid diverticulosis no acute diverticulitis. LUMPAR SPINE: 1. No acute displaced fracture or traumatic listhesis of the lumbar spine. Electronically Signed   By: Iven Finn M.D.   On: 05/10/2020 19:02   CT Angio Chest/Abd/Pel for Dissection W and/or Wo Contrast  Result Date: 05/10/2020 CLINICAL DATA:  Chest pain and back pain, aortic dissection suspected. Lower back pain worse on the right. Current everyday smoker. EXAM: CT ANGIOGRAPHY CHEST, ABDOMEN AND PELVIS CT LUMBAR SPINE WITHOUT CONTRAST TECHNIQUE: Non-contrast CT of the chest was initially obtained. Multidetector CT imaging through the chest, abdomen and pelvis was performed using the standard protocol during bolus administration of intravenous contrast. Multiplanar reconstructed images and MIPs were obtained and reviewed to evaluate the vascular anatomy. Multidetector CT imaging of the lumbar spine was performed without intravenous contrast administration. Multiplanar CT image reconstructions were also generated. CONTRAST:  129mL OMNIPAQUE IOHEXOL 350 MG/ML SOLN COMPARISON:  X-ray lumbar spine 05/10/2020. FINDINGS: VASCULAR Aorta: Aortic root calcifications noted. Extensive calcified and noncalcified atherosclerotic. There is greater than 50% narrowing of the descending thoracic aorta due to noncalcified plaque (5:44, 68) and similarly of the infrarenal abdominal aorta (5: 108). The ascending aorta is normal in caliber. There is aneurysmal dilatation of the distal descending thoracic aorta which measures up to 4 cm in caliber (8:140) and extends approximately 5 cm in the craniocaudal dimension. No associated periaortic fat stranding.  No dissection or vasculitis of the thoracic aorta. No aneurysm, dissection, vasculitis of the abdominal aorta. Coronary arteries: Least mild four-vessel coronary artery calcifications. Pulmonary artery: The main  pulmonary artery is normal in caliber. No central pulmonary embolus. Celiac: Mild atherosclerotic plaque. Patent without evidence of aneurysm, dissection, vasculitis or significant stenosis. SMA: Mild atherosclerotic plaque. Patent without evidence of aneurysm, dissection, vasculitis or significant stenosis. Renals: Total of 2 left renal arteries. A single right renal artery noted. Mild atherosclerotic plaque. Patent without evidence of aneurysm, dissection, vasculitis or significant stenosis. IMA: Mild atherosclerotic plaque. Patent without evidence of aneurysm, dissection, vasculitis or significant stenosis. Inflow: Moderate atherosclerotic plaque. Patent without evidence of aneurysm, dissection, vasculitis or significant stenosis. Veins: No obvious venous abnormality within the limitations of this arterial phase study. NON-VASCULAR CTA CHEST FINDINGS Cardiovascular: Preferential opacification of the thoracic aorta. Normal heart size. Suggestion of mild mitral annular calcifications. No significant pericardial effusion. Mediastinum/Nodes: There is an enlarged 1 cm right hilar lymph node (5:37). No left hilar lymphadenopathy. There is an enlarged precarinal lymph node measuring up to 1 cm (5:31). No axillary lymph nodes. Thyroid gland, trachea, and esophagus demonstrate no significant findings. Tiny hiatal hernia. Lungs/Pleura: Biapical pleural/pulmonary scarring. There is a spiculated 1.8 x 1.3 x 1.6 cm right upper lobe pulmonary nodule (6:56) with associated tethering of minor fissure. Right upper lobe subpleural micronodule (6:56). A 0.5 cm nodular like subpleural densities noted within the right apex (6:21, 8:145). No pulmonary mass. No focal consolidation. Bilateral lower lobe subsegmental atelectasis. Biapical pleural/pulmonary scarring. No pleural effusion. No pneumothorax. Musculoskeletal: No chest wall abnormality No suspicious lytic or blastic osseous lesions. No acute displaced rib fracture. Chronic  appearing compression fracture of the T6 and T7 vertebral bodies. Multilevel degenerative changes of the spine. CTA ABDOMEN AND PELVIS FINDINGS Hepatobiliary: The liver is enlarged measuring up to 20 cm. No focal liver abnormality is seen. Status post cholecystectomy. No biliary dilatation. Pancreas: No focal lesion. Normal pancreatic contour. No surrounding inflammatory changes. No main pancreatic ductal dilatation. Spleen: Heterogeneous appearance of the spleen consistent with arterial phase. Normal in size without focal abnormality. Adrenals/Urinary Tract: No adrenal nodule bilaterally. Bilateral kidneys enhance symmetrically. No hydronephrosis. No hydroureter. The urinary bladder is unremarkable. Stomach/Bowel: Stomach is within normal limits. No evidence of bowel wall thickening or dilatation. Sigmoid diverticulosis. The appendix is not definitely identified. Lymphatic: No lymphadenopathy. Reproductive: Status post hysterectomy. No adnexal masses. Other: No intraperitoneal free fluid. No intraperitoneal free gas. No organized fluid collection. Musculoskeletal: No abdominal wall hernia or abnormality. No suspicious lytic or blastic osseous lesions. No acute displaced fracture of the pelvis or sacrum. Multilevel degenerative changes of the spine. CT LUMPAR SPINE: Segmentation: 6 non-rib-bearing lumbar vertebral bodies. To keep with nomenclature for prior x-ray, lower most vertebra is numbered S1. Alignment: Similar-appearing grade 1 anterolisthesis of L5 on S1. Slight dextrocurvature of the lower lumbar spine. Vertebra/Intervertebral disc space: Similar-appearing severe degenerative changes at L2-L3 level and S1 level with intervertebral disc space vacuum phenomenon and endplate sclerosis. Facet arthropathy noted at the L5-S1 level. No severe neural foraminal stenosis. No severe central canal stenosis. Paraspinal and other soft tissues: Negative. Review of the MIP images confirms the above findings. IMPRESSION:  VASCULAR: 1. 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 No CT findings to suggest imminent rupture. 2. Extensive calcified and noncalcified atherosclerotic plaque of the thoracic and abdominal aorta with area of atherosclerotic plaque leading to greater than 50% narrowing of the lumen. Aortic Atherosclerosis (  ICD10-I70.0). NONVASCULAR: 1. A 1.8 cm right upper lobe pulmonary nodule. Indeterminate right hilar and precarinal borderline enlarged lymphadenopathy. Findings concerning for malignancy. Additional imaging evaluation or consultation with Pulmonology or Thoracic Surgery recommended. 2. Hepatomegaly. 3. Sigmoid diverticulosis no acute diverticulitis. LUMPAR SPINE: 1. No acute displaced fracture or traumatic listhesis of the lumbar spine. Electronically Signed   By: Iven Finn M.D.   On: 05/10/2020 19:02     Assessment/Plan 1.  Aortic aneurysms in association with severe atheromatous changes throughout the aorta: At the present time it does not appear that the aortic pathology is the source of her back pain.  There is no evidence of an acute change.  There is no evidence that she has embolized.  Therefore, I do not believe that she needs any emergent vascular intervention.  In review of the thoracic component in particular is a very complex situation and would best be served at an institution such as [facility] where stent grafting could be performed in association with spinal drainage should symptoms of weakness of the lower extremities manifest themselves.  Furthermore the repair of the abdominal portion would potentially require components that we do not have here at [facility].  Given this nonemergent nature as well as the complexity and relative high risk of repairing nearly her entire aorta fully working up both the lung mass as well as her anemia would take precedent.  2.  Right lung mass suspicious for malignancy: Further work-up is certainly  indicated prior to any vascular intervention as her overall prognosis would weigh heavily on the risks of repairing her aorta.  3.  Anemia: This too is an issue that needs to be more clearly understood prior to any vascular intervention as anticoagulation is absolutely crucial and until her anemia is better understood my dramatically exasperate her bleeding.  Particularly if this is secondary to a GI source such as a gastric ulcer or colon cancer.  4.  COPD: Continue pulmonary medications and aerosols as already ordered, these medications have been reviewed and there are no changes at this time.     Hortencia Pilar, MD  05/11/2020 7:52 PM

## 2020-05-11 NOTE — Evaluation (Signed)
Physical Therapy Evaluation Patient Details Name: Marissa Fletcher MRN: [id number] DOB: [birth year] Today's Date: 05/11/2020   History of Present Illness  Pt is a 79 y.o. female with medical history significant for hypertension, type 2 diabetes mellitus, depression, anxiety, iron deficiency anemia, and tobacco abuse, now presenting to the emergency department for evaluation of back pain.  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 Also showed  1.8 cm right upper lobe pulmonary nodule.    Clinical Impression  Pt alert, agreeable to PT did report pain at rest was 2/10, but with PT increased to 5-6/10 at end of session, RN notified. Pt stated she lives alone in an apartment, independent, drives. One near fall where she slid to the floor this weekend due to her onset of back pain. Some family does live nearby.  Pt instructed in log roll technique for bed mobility to minimize back pain, able to perform with CGA and verbal cues. Pt also instructed in positioning to decrease low back pain in bed. Good sitting balance noted. Sit <> stand with RW and CGA, and she was able to ambulate ~59feet with RW and CGA. Cued for standing rest breaks, pt mildly SOB and did endorse that her legs felt a bit weaker than normal. Returned to supine with all needs in reach.  Overall the patient demonstrated deficits (see "PT Problem List") that impede the patient's functional abilities, safety, and mobility and would benefit from skilled PT intervention. Recommendation is HHPT for continued pain management/education as well as to maximize strength, function, and return to PLOF.     Follow Up Recommendations Home health PT    Equipment Recommendations  Rolling walker with 5" wheels    Recommendations for Other Services       Precautions / Restrictions Precautions Precautions: Fall Restrictions Weight Bearing  Restrictions: No      Mobility  Bed Mobility Overal bed mobility: Needs Assistance Bed Mobility: Rolling;Sidelying to Sit;Sit to Sidelying Rolling: Min guard Sidelying to sit: Min guard     Sit to sidelying: Min guard      Transfers Overall transfer level: Needs assistance Equipment used: Rolling walker (2 wheeled) Transfers: Sit to/from Stand Sit to Stand: Min guard         General transfer comment: cues for hand placement to ensure safety  Ambulation/Gait   Gait Distance (Feet): 70 Feet Assistive device: Rolling walker (2 wheeled)   Gait velocity: decreased   General Gait Details: Pt with reciprocal gait pattern. did become SOB, cued for standing rest breaks as needed  Stairs            Wheelchair Mobility    Modified Rankin (Stroke Patients Only)       Balance Overall balance assessment: Needs assistance Sitting-balance support: Feet supported Sitting balance-Leahy Scale: Good       Standing balance-Leahy Scale: Good                               Pertinent Vitals/Pain Pain Assessment: 0-10 Pain Score: 6  Pain Location: low back pain Pain Descriptors / Indicators: Aching;Throbbing;Tightness Pain Intervention(s): Limited activity within patient's tolerance;Monitored during session;Repositioned    Home Living Family/patient expects to be discharged to:: Private residence Living Arrangements: Alone Available Help at Discharge: Family;Friend(s);Available PRN/intermittently Type of Home: Apartment Home Access: Level entry     Home Layout: One  level Home Equipment: Shower seat      Prior Function Level of Independence: Independent         Comments: drives. 1 fall this year where it was a controlled slide to floor in the closet when her back pain started     Hand Dominance   Dominant Hand: Right    Extremity/Trunk Assessment   Upper Extremity Assessment Upper Extremity Assessment: Overall WFL for tasks assessed     Lower Extremity Assessment Lower Extremity Assessment: Generalized weakness    Cervical / Trunk Assessment Cervical / Trunk Assessment: Normal  Communication   Communication: No difficulties  Cognition Arousal/Alertness: Awake/alert Behavior During Therapy: WFL for tasks assessed/performed Overall Cognitive Status: Within Functional Limits for tasks assessed                                        General Comments      Exercises     Assessment/Plan    PT Assessment Patient needs continued PT services  PT Problem List Decreased activity tolerance;Decreased balance;Decreased mobility;Pain       PT Treatment Interventions DME instruction;Therapeutic exercise;Balance training;Gait training;Neuromuscular re-education;Functional mobility training;Patient/family education;Therapeutic activities    PT Goals (Current goals can be found in the Care Plan section)  Acute Rehab PT Goals Patient Stated Goal: to go home PT Goal Formulation: With patient Time For Goal Achievement: 05/25/20 Potential to Achieve Goals: Good    Frequency Min 2X/week   Barriers to discharge        Co-evaluation               AM-PAC PT "6 Clicks" Mobility  Outcome Measure Help needed turning from your back to your side while in a flat bed without using bedrails?: A Little Help needed moving from lying on your back to sitting on the side of a flat bed without using bedrails?: A Little Help needed moving to and from a bed to a chair (including a wheelchair)?: A Little Help needed standing up from a chair using your arms (e.g., wheelchair or bedside chair)?: A Little Help needed to walk in hospital room?: A Little Help needed climbing 3-5 steps with a railing? : A Little 6 Click Score: 18    End of Session Equipment Utilized During Treatment: Gait belt Activity Tolerance: Patient tolerated treatment well Patient left: in bed;with call bell/phone within reach;with bed alarm  set Nurse Communication: Mobility status PT Visit Diagnosis: Other abnormalities of gait and mobility (R26.89);Difficulty in walking, not elsewhere classified (R26.2);Pain Pain - Right/Left:  (midline) Pain - part of body:  (low back pain)    Time: 1610-9604 PT Time Calculation (min) (ACUTE ONLY): 28 min   Charges:   PT Evaluation $PT Eval Low Complexity: 1 Low PT Treatments $Therapeutic Exercise: 23-37 mins      Lieutenant Diego PT, DPT 3:31 PM,05/11/20

## 2020-05-11 NOTE — Progress Notes (Signed)
PROGRESS NOTE    Marissa Fletcher  HQI:[id number] DOB: 19-Jun-1940 DOA: 05/10/2020 PCP: Juluis Pitch, MD   Chief Complain: Back pain  Brief Narrative: Patient is 79 year old female with history of hypertension, type 2 diabetes mellitus, depression, anxiety, iron deficiency anemia, tobacco abuse who presents to the emergency room for the evaluation of back pain.  She had chronic back pain but it got worsened about 3 days ago.  Upon presentation, she was hemodynamically stable.  Found to have microcytic anemia with hemoglobin range of 7.9.  Chest x-ray cardiomegaly/interstitial coarsening that appears chronic.  Extensive skeletal survey was done which did not show any fracture or dislocation.  Patient was also complaining of some abdominal discomfort.  Imagings also showed about 4 cm descending thoracic aortic aneurysm.  Vascular surgery consulted by admitting physician.  Patient waiting for physical therapy assessment today.   Assessment & Plan:   Principal Problem:   Back pain Active Problems:   Diabetes mellitus without complication (HCC)   Hypertension   Microcytic anemia   Lung nodule seen on imaging study   Depression with anxiety   Tobacco abuse  Back pain: Acute on chronic back pain worsened about 3 days ago. Lumbar MRI multilevel degenerative changes of the lumbar spine.  Continue pain management supportive care.  PT/OT consulted  Descending thoracic aortic aneurysm:4 centimeter.  She is a smoker.  Vascular surgery was consulted by the admitting physician.  Most likely the plan will be for outpatient follow-up/yearly ultrasound  Right pulmonary nodule: A 1.8 cm right upper lobe pulmonary nodule. Indeterminate right hilar and precarinal borderline enlarged lymphadenopathy. Findings concerning for malignancy.  She is a current smoker. I have left message to  Dr Raul Del( pulmonology) about setting of outpatient follow-up for PET scanning or other work-up.  She shd be  called for  follow-up as an outpatient.  Abdomen pain/distention: Abdomen looked distended.  CT imaging did not show any acute intra-abdominal findings.  We will try bowel regimen, will give her an enema.  Her last bowel movement was on Sunday.  Microcytic  anemia: Hemoglobin in the range of 7 on presentation.  Her hemoglobin was 11.3 in May 2021.  She denies any change in the color of the stool, melena or hematochezia.  Will check FOBT.  She was recently prescribed iron supplements by her primary care physician.  Continue iron supplementation discharge.  Normal vitamin X32 and folic acid level. Iron studies showed low iron, she was given a dose of iron infusion today.  Tobacco use: Smokes a pack a day.  Counseled cessation.  Diabetes mellitus type 2: Hemoglobin A1c was 6.7 recently.  Continue sliding scale insulin for now.  Monitor CBGs  Hypokalemia: Supplemented with potassium.  Hypertension: Continue current medications and monitor blood pressure.          DVT prophylaxis:SCD Code Status: Full Family Communication: sister at bedside Status is: Observation  The patient remains OBS appropriate and will d/c before 2 midnights.  Dispo: The patient is from: Home              Anticipated d/c is to: Home              Anticipated d/c date is: 2 days              Patient currently is not medically stable to d/c.   Consultants: Vascular surgery  Procedures:None  Antimicrobials:  Anti-infectives (From admission, onward)   None      Subjective: Patient seen and examined  at the bedside this morning.  She was complaining of severe back pain and was uncomfortable, tossing around the bed.  She was also complaining of some abdominal pain and she has an abdominal distention.  She reported that she did not have a bowel movement since last Sunday.  Sister was at the bedside.  Objective: Vitals:   05/11/20 0438 05/11/20 0444 05/11/20 0745 05/11/20 1159  BP: 129/62  130/74 (!) 123/55  Pulse: 90  89 92 92  Resp: 19  16 18   Temp: 98.4 F (36.9 C)  98.6 F (37 C) 98.2 F (36.8 C)  TempSrc:   Oral   SpO2: 92% 94% 99% 90%  Weight:      Height:        Intake/Output Summary (Last 24 hours) at 05/11/2020 1308 Last data filed at 05/11/2020 1011 Gross per 24 hour  Intake 572.33 ml  Output 1250 ml  Net -677.67 ml   Filed Weights   05/10/20 1348  Weight: 72.6 kg    Examination:  General exam: Pleasant elderly female Respiratory system: Bilateral equal air entry, normal vesicular breath sounds, no wheezes or crackles  Cardiovascular system: S1 & S2 heard, RRR. No JVD, murmurs, rubs, gallops or clicks. No pedal edema. Gastrointestinal system: Abdomen is distended, soft and nontender. No organomegaly or masses felt. Normal bowel sounds heard. Central nervous system: Alert and oriented. No focal neurological deficits. Extremities: No edema, no clubbing ,no cyanosis Skin: No rashes, lesions or ulcers,no icterus ,no pallor    Data Reviewed: I have personally reviewed following labs and imaging studies  CBC: Recent Labs  Lab 05/10/20 1537 05/10/20 1921 05/11/20 0422  WBC 8.7  --  9.1  NEUTROABS 5.9  --   --   HGB 7.9* 7.8* 8.0*  HCT 26.4* 26.2* 26.2*  MCV 75.2*  --  75.7*  PLT 341  --  322   Basic Metabolic Panel: Recent Labs  Lab 05/10/20 1537 05/11/20 0422  NA 134* 137  K 3.9 3.4*  CL 98 101  CO2 25 26  GLUCOSE 117* 111*  BUN 12 10  CREATININE 0.83 0.75  CALCIUM 8.7* 8.6*   GFR: Estimated Creatinine Clearance: 54.8 mL/min (by C-G formula based on SCr of 0.75 mg/dL). Liver Function Tests: Recent Labs  Lab 05/10/20 1537  AST 25  ALT 16  ALKPHOS 125  BILITOT 0.5  PROT 6.8  ALBUMIN 3.4*   No results for input(s): LIPASE, AMYLASE in the last 168 hours. No results for input(s): AMMONIA in the last 168 hours. Coagulation Profile: Recent Labs  Lab 05/10/20 1921  INR 1.0   Cardiac Enzymes: No results for input(s): CKTOTAL, CKMB, CKMBINDEX,  TROPONINI in the last 168 hours. BNP (last 3 results) No results for input(s): PROBNP in the last 8760 hours. HbA1C: No results for input(s): HGBA1C in the last 72 hours. CBG: Recent Labs  Lab 05/10/20 2352 05/11/20 0403 05/11/20 0747 05/11/20 1157  GLUCAP 108* 112* 133* 200*   Lipid Profile: No results for input(s): CHOL, HDL, LDLCALC, TRIG, CHOLHDL, LDLDIRECT in the last 72 hours. Thyroid Function Tests: No results for input(s): TSH, T4TOTAL, FREET4, T3FREE, THYROIDAB in the last 72 hours. Anemia Panel: Recent Labs    05/10/20 1537 05/10/20 1921 05/10/20 2335  VITAMINB12  --   --  3,773*  FOLATE 33.0  --   --   FERRITIN 9*  --   --   TIBC 382  --   --   IRON 14*  --   --  RETICCTPCT  --  2.0  --    Sepsis Labs: No results for input(s): PROCALCITON, LATICACIDVEN in the last 168 hours.  Recent Results (from the past 240 hour(s))  Resp Panel by RT-PCR (Flu A&B, Covid) Nasopharyngeal Swab     Status: None   Collection Time: 05/10/20  9:15 PM   Specimen: Nasopharyngeal Swab; Nasopharyngeal(NP) swabs in vial transport medium  Result Value Ref Range Status   SARS Coronavirus 2 by RT PCR NEGATIVE NEGATIVE Final    Comment: (NOTE) SARS-CoV-2 target nucleic acids are NOT DETECTED.  The SARS-CoV-2 RNA is generally detectable in upper respiratory specimens during the acute phase of infection. The lowest concentration of SARS-CoV-2 viral copies this assay can detect is 138 copies/mL. A negative result does not preclude SARS-Cov-2 infection and should not be used as the sole basis for treatment or other patient management decisions. A negative result may occur with  improper specimen collection/handling, submission of specimen other than nasopharyngeal swab, presence of viral mutation(s) within the areas targeted by this assay, and inadequate number of viral copies(<138 copies/mL). A negative result must be combined with clinical observations, patient history, and  epidemiological information. The expected result is Negative.  Fact Sheet for Patients:  EntrepreneurPulse.com.au  Fact Sheet for Healthcare Providers:  IncredibleEmployment.be  This test is no t yet approved or cleared by the [place] FDA and  has been authorized for detection and/or diagnosis of SARS-CoV-2 by FDA under an Emergency Use Authorization (EUA). This EUA will remain  in effect (meaning this test can be used) for the duration of the COVID-19 declaration under Section 564(b)(1) of the Act, 21 U.S.C.section 360bbb-3(b)(1), unless the authorization is terminated  or revoked sooner.       Influenza A by PCR NEGATIVE NEGATIVE Final   Influenza B by PCR NEGATIVE NEGATIVE Final    Comment: (NOTE) The Xpert Xpress SARS-CoV-2/FLU/RSV plus assay is intended as an aid in the diagnosis of influenza from Nasopharyngeal swab specimens and should not be used as a sole basis for treatment. Nasal washings and aspirates are unacceptable for Xpert Xpress SARS-CoV-2/FLU/RSV testing.  Fact Sheet for Patients: EntrepreneurPulse.com.au  Fact Sheet for Healthcare Providers: IncredibleEmployment.be  This test is not yet approved or cleared by the [place] FDA and has been authorized for detection and/or diagnosis of SARS-CoV-2 by FDA under an Emergency Use Authorization (EUA). This EUA will remain in effect (meaning this test can be used) for the duration of the COVID-19 declaration under Section 564(b)(1) of the Act, 21 U.S.C. section 360bbb-3(b)(1), unless the authorization is terminated or revoked.  Performed at [facility], [address] [place], [place] [address]          Radiology Studies: DG Chest 2 View  Result Date: 05/10/2020 CLINICAL DATA:  Back pain EXAM: CHEST - 2 VIEW COMPARISON:  None. FINDINGS: Cardiomegaly. Atherosclerotic calcification of the aortic knob. Coarsened  interstitial markings throughout both lungs. Minimal linear opacity in the periphery of the right lung base. No pleural effusion or pneumothorax. Osseous structures appear mildly demineralized. No acute findings. IMPRESSION: 1. Minimal linear opacity in the periphery of the right lung base, likely atelectasis. 2. Cardiomegaly. Coarsened interstitial markings throughout both lungs, likely chronic. Electronically Signed   By: Davina Poke D.O.   On: 05/10/2020 15:58   DG Lumbar Spine 2-3 Views  Result Date: 05/10/2020 CLINICAL DATA:  79 year old female with back pain. EXAM: LUMBAR SPINE - 2-3 VIEW COMPARISON:  None. FINDINGS: Six lumbar type vertebra. The lowermost  vertebra is numbered as S1. There is no acute fracture or subluxation of the lumbar spine. The bones are osteopenic. Multilevel degenerative changes with disc space narrowing and endplate irregularity primarily at L2-L3. There is grade 1 L5-S1 anterolisthesis. The visualized posterior elements are intact. 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 The soft tissues are unremarkable. Right upper quadrant cholecystectomy clips. Small calcific densities in the left upper quadrant. There is atherosclerotic calcification of the aorta. The aorta is aneurysmal measuring 3.3 cm. IMPRESSION: 1. No acute fracture or subluxation of the lumbar spine. 2. Multilevel degenerative changes of the lumbar spine. 3. 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 Aortic Atherosclerosis (ICD10-I70.0). Electronically Signed   By: Anner Crete M.D.   On: 05/10/2020 15:55   DG Abdomen 1 View  Result Date: 05/10/2020 CLINICAL DATA:  Back pain. EXAM: ABDOMEN - 1 VIEW COMPARISON:  None. FINDINGS: The bowel gas pattern is normal. No radio-opaque calculi or other significant radiographic abnormality are seen. IMPRESSION: Negative. Electronically Signed   By: Constance Holster M.D.   On: 05/10/2020 15:58   CT L-SPINE NO CHARGE  Result Date: 05/10/2020 CLINICAL DATA:  Chest pain and  back pain, aortic dissection suspected. Lower back pain worse on the right. Current everyday smoker. EXAM: CT ANGIOGRAPHY CHEST, ABDOMEN AND PELVIS CT LUMBAR SPINE WITHOUT CONTRAST TECHNIQUE: Non-contrast CT of the chest was initially obtained. Multidetector CT imaging through the chest, abdomen and pelvis was performed using the standard protocol during bolus administration of intravenous contrast. Multiplanar reconstructed images and MIPs were obtained and reviewed to evaluate the vascular anatomy. Multidetector CT imaging of the lumbar spine was performed without intravenous contrast administration. Multiplanar CT image reconstructions were also generated. CONTRAST:  118mL OMNIPAQUE IOHEXOL 350 MG/ML SOLN COMPARISON:  X-ray lumbar spine 05/10/2020. FINDINGS: VASCULAR Aorta: Aortic root calcifications noted. Extensive calcified and noncalcified atherosclerotic. There is greater than 50% narrowing of the descending thoracic aorta due to noncalcified plaque (5:44, 68) and similarly of the infrarenal abdominal aorta (5: 108). The ascending aorta is normal in caliber. There is aneurysmal dilatation of the distal descending thoracic aorta which measures up to 4 cm in caliber (8:140) and extends approximately 5 cm in the craniocaudal dimension. No associated periaortic fat stranding. No dissection or vasculitis of the thoracic aorta. No aneurysm, dissection, vasculitis of the abdominal aorta. Coronary arteries: Least mild four-vessel coronary artery calcifications. Pulmonary artery: The main pulmonary artery is normal in caliber. No central pulmonary embolus. Celiac: Mild atherosclerotic plaque. Patent without evidence of aneurysm, dissection, vasculitis or significant stenosis. SMA: Mild atherosclerotic plaque. Patent without evidence of aneurysm, dissection, vasculitis or significant stenosis. Renals: Total of 2 left renal arteries. A single right renal artery noted. Mild atherosclerotic plaque. Patent without  evidence of aneurysm, dissection, vasculitis or significant stenosis. IMA: Mild atherosclerotic plaque. Patent without evidence of aneurysm, dissection, vasculitis or significant stenosis. Inflow: Moderate atherosclerotic plaque. Patent without evidence of aneurysm, dissection, vasculitis or significant stenosis. Veins: No obvious venous abnormality within the limitations of this arterial phase study. NON-VASCULAR CTA CHEST FINDINGS Cardiovascular: Preferential opacification of the thoracic aorta. Normal heart size. Suggestion of mild mitral annular calcifications. No significant pericardial effusion. Mediastinum/Nodes: There is an enlarged 1 cm right hilar lymph node (5:37). No left hilar lymphadenopathy. There is an enlarged precarinal lymph node measuring up to 1 cm (5:31). No axillary lymph nodes. Thyroid gland, trachea, and esophagus demonstrate no significant findings. Tiny hiatal hernia. Lungs/Pleura: Biapical pleural/pulmonary scarring. There is a spiculated 1.8 x 1.3 x 1.6 cm right upper lobe  pulmonary nodule (6:56) with associated tethering of minor fissure. Right upper lobe subpleural micronodule (6:56). A 0.5 cm nodular like subpleural densities noted within the right apex (6:21, 8:145). No pulmonary mass. No focal consolidation. Bilateral lower lobe subsegmental atelectasis. Biapical pleural/pulmonary scarring. No pleural effusion. No pneumothorax. Musculoskeletal: No chest wall abnormality No suspicious lytic or blastic osseous lesions. No acute displaced rib fracture. Chronic appearing compression fracture of the T6 and T7 vertebral bodies. Multilevel degenerative changes of the spine. CTA ABDOMEN AND PELVIS FINDINGS Hepatobiliary: The liver is enlarged measuring up to 20 cm. No focal liver abnormality is seen. Status post cholecystectomy. No biliary dilatation. Pancreas: No focal lesion. Normal pancreatic contour. No surrounding inflammatory changes. No main pancreatic ductal dilatation. Spleen:  Heterogeneous appearance of the spleen consistent with arterial phase. Normal in size without focal abnormality. Adrenals/Urinary Tract: No adrenal nodule bilaterally. Bilateral kidneys enhance symmetrically. No hydronephrosis. No hydroureter. The urinary bladder is unremarkable. Stomach/Bowel: Stomach is within normal limits. No evidence of bowel wall thickening or dilatation. Sigmoid diverticulosis. The appendix is not definitely identified. Lymphatic: No lymphadenopathy. Reproductive: Status post hysterectomy. No adnexal masses. Other: No intraperitoneal free fluid. No intraperitoneal free gas. No organized fluid collection. Musculoskeletal: No abdominal wall hernia or abnormality. No suspicious lytic or blastic osseous lesions. No acute displaced fracture of the pelvis or sacrum. Multilevel degenerative changes of the spine. CT LUMPAR SPINE: Segmentation: 6 non-rib-bearing lumbar vertebral bodies. To keep with nomenclature for prior x-ray, lower most vertebra is numbered S1. Alignment: Similar-appearing grade 1 anterolisthesis of L5 on S1. Slight dextrocurvature of the lower lumbar spine. Vertebra/Intervertebral disc space: Similar-appearing severe degenerative changes at L2-L3 level and S1 level with intervertebral disc space vacuum phenomenon and endplate sclerosis. Facet arthropathy noted at the L5-S1 level. No severe neural foraminal stenosis. No severe central canal stenosis. Paraspinal and other soft tissues: Negative. Review of the MIP images confirms the above findings. IMPRESSION: VASCULAR: 1. 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 No CT findings to suggest imminent rupture. 2. Extensive calcified and noncalcified atherosclerotic plaque of the thoracic and abdominal aorta with area of atherosclerotic plaque leading to greater than 50% narrowing of the lumen. Aortic Atherosclerosis (ICD10-I70.0). NONVASCULAR: 1. A 1.8 cm right upper lobe pulmonary nodule.  Indeterminate right hilar and precarinal borderline enlarged lymphadenopathy. Findings concerning for malignancy. Additional imaging evaluation or consultation with Pulmonology or Thoracic Surgery recommended. 2. Hepatomegaly. 3. Sigmoid diverticulosis no acute diverticulitis. LUMPAR SPINE: 1. No acute displaced fracture or traumatic listhesis of the lumbar spine. Electronically Signed   By: Iven Finn M.D.   On: 05/10/2020 19:02   CT Angio Chest/Abd/Pel for Dissection W and/or Wo Contrast  Result Date: 05/10/2020 CLINICAL DATA:  Chest pain and back pain, aortic dissection suspected. Lower back pain worse on the right. Current everyday smoker. EXAM: CT ANGIOGRAPHY CHEST, ABDOMEN AND PELVIS CT LUMBAR SPINE WITHOUT CONTRAST TECHNIQUE: Non-contrast CT of the chest was initially obtained. Multidetector CT imaging through the chest, abdomen and pelvis was performed using the standard protocol during bolus administration of intravenous contrast. Multiplanar reconstructed images and MIPs were obtained and reviewed to evaluate the vascular anatomy. Multidetector CT imaging of the lumbar spine was performed without intravenous contrast administration. Multiplanar CT image reconstructions were also generated. CONTRAST:  157mL OMNIPAQUE IOHEXOL 350 MG/ML SOLN COMPARISON:  X-ray lumbar spine 05/10/2020. FINDINGS: VASCULAR Aorta: Aortic root calcifications noted. Extensive calcified and noncalcified atherosclerotic. There is greater than 50% narrowing of the descending thoracic aorta due  to noncalcified plaque (5:44, 68) and similarly of the infrarenal abdominal aorta (5: 108). The ascending aorta is normal in caliber. There is aneurysmal dilatation of the distal descending thoracic aorta which measures up to 4 cm in caliber (8:140) and extends approximately 5 cm in the craniocaudal dimension. No associated periaortic fat stranding. No dissection or vasculitis of the thoracic aorta. No aneurysm, dissection, vasculitis  of the abdominal aorta. Coronary arteries: Least mild four-vessel coronary artery calcifications. Pulmonary artery: The main pulmonary artery is normal in caliber. No central pulmonary embolus. Celiac: Mild atherosclerotic plaque. Patent without evidence of aneurysm, dissection, vasculitis or significant stenosis. SMA: Mild atherosclerotic plaque. Patent without evidence of aneurysm, dissection, vasculitis or significant stenosis. Renals: Total of 2 left renal arteries. A single right renal artery noted. Mild atherosclerotic plaque. Patent without evidence of aneurysm, dissection, vasculitis or significant stenosis. IMA: Mild atherosclerotic plaque. Patent without evidence of aneurysm, dissection, vasculitis or significant stenosis. Inflow: Moderate atherosclerotic plaque. Patent without evidence of aneurysm, dissection, vasculitis or significant stenosis. Veins: No obvious venous abnormality within the limitations of this arterial phase study. NON-VASCULAR CTA CHEST FINDINGS Cardiovascular: Preferential opacification of the thoracic aorta. Normal heart size. Suggestion of mild mitral annular calcifications. No significant pericardial effusion. Mediastinum/Nodes: There is an enlarged 1 cm right hilar lymph node (5:37). No left hilar lymphadenopathy. There is an enlarged precarinal lymph node measuring up to 1 cm (5:31). No axillary lymph nodes. Thyroid gland, trachea, and esophagus demonstrate no significant findings. Tiny hiatal hernia. Lungs/Pleura: Biapical pleural/pulmonary scarring. There is a spiculated 1.8 x 1.3 x 1.6 cm right upper lobe pulmonary nodule (6:56) with associated tethering of minor fissure. Right upper lobe subpleural micronodule (6:56). A 0.5 cm nodular like subpleural densities noted within the right apex (6:21, 8:145). No pulmonary mass. No focal consolidation. Bilateral lower lobe subsegmental atelectasis. Biapical pleural/pulmonary scarring. No pleural effusion. No pneumothorax.  Musculoskeletal: No chest wall abnormality No suspicious lytic or blastic osseous lesions. No acute displaced rib fracture. Chronic appearing compression fracture of the T6 and T7 vertebral bodies. Multilevel degenerative changes of the spine. CTA ABDOMEN AND PELVIS FINDINGS Hepatobiliary: The liver is enlarged measuring up to 20 cm. No focal liver abnormality is seen. Status post cholecystectomy. No biliary dilatation. Pancreas: No focal lesion. Normal pancreatic contour. No surrounding inflammatory changes. No main pancreatic ductal dilatation. Spleen: Heterogeneous appearance of the spleen consistent with arterial phase. Normal in size without focal abnormality. Adrenals/Urinary Tract: No adrenal nodule bilaterally. Bilateral kidneys enhance symmetrically. No hydronephrosis. No hydroureter. The urinary bladder is unremarkable. Stomach/Bowel: Stomach is within normal limits. No evidence of bowel wall thickening or dilatation. Sigmoid diverticulosis. The appendix is not definitely identified. Lymphatic: No lymphadenopathy. Reproductive: Status post hysterectomy. No adnexal masses. Other: No intraperitoneal free fluid. No intraperitoneal free gas. No organized fluid collection. Musculoskeletal: No abdominal wall hernia or abnormality. No suspicious lytic or blastic osseous lesions. No acute displaced fracture of the pelvis or sacrum. Multilevel degenerative changes of the spine. CT LUMPAR SPINE: Segmentation: 6 non-rib-bearing lumbar vertebral bodies. To keep with nomenclature for prior x-ray, lower most vertebra is numbered S1. Alignment: Similar-appearing grade 1 anterolisthesis of L5 on S1. Slight dextrocurvature of the lower lumbar spine. Vertebra/Intervertebral disc space: Similar-appearing severe degenerative changes at L2-L3 level and S1 level with intervertebral disc space vacuum phenomenon and endplate sclerosis. Facet arthropathy noted at the L5-S1 level. No severe neural foraminal stenosis. No severe  central canal stenosis. Paraspinal and other soft tissues: Negative. Review of the MIP images confirms the  above findings. IMPRESSION: VASCULAR: 1. 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 No CT findings to suggest imminent rupture. 2. Extensive calcified and noncalcified atherosclerotic plaque of the thoracic and abdominal aorta with area of atherosclerotic plaque leading to greater than 50% narrowing of the lumen. Aortic Atherosclerosis (ICD10-I70.0). NONVASCULAR: 1. A 1.8 cm right upper lobe pulmonary nodule. Indeterminate right hilar and precarinal borderline enlarged lymphadenopathy. Findings concerning for malignancy. Additional imaging evaluation or consultation with Pulmonology or Thoracic Surgery recommended. 2. Hepatomegaly. 3. Sigmoid diverticulosis no acute diverticulitis. LUMPAR SPINE: 1. No acute displaced fracture or traumatic listhesis of the lumbar spine. Electronically Signed   By: Iven Finn M.D.   On: 05/10/2020 19:02        Scheduled Meds: . insulin aspart  0-9 Units Subcutaneous Q4H  . lidocaine  1 patch Transdermal Q24H  . nicotine  21 mg Transdermal Daily  . polyethylene glycol  17 g Oral Daily   Continuous Infusions: . methocarbamol (ROBAXIN) IV       LOS: 0 days    Time spent: 35 mins.More than 50% of that time was spent in counseling and/or coordination of care.      Shelly Coss, MD [facility] P12/21/2021, 1:08 PM

## 2020-05-12 DIAGNOSIS — F418 Other specified anxiety disorders: Secondary | ICD-10-CM

## 2020-05-12 DIAGNOSIS — I714 Abdominal aortic aneurysm, without rupture, unspecified: Secondary | ICD-10-CM

## 2020-05-12 DIAGNOSIS — E1169 Type 2 diabetes mellitus with other specified complication: Secondary | ICD-10-CM

## 2020-05-12 DIAGNOSIS — E785 Hyperlipidemia, unspecified: Secondary | ICD-10-CM

## 2020-05-12 LAB — CBC
HCT: 28.4 % — ABNORMAL LOW (ref 36.0–46.0)
Hemoglobin: 8.7 g/dL — ABNORMAL LOW (ref 12.0–15.0)
MCH: 23 pg — ABNORMAL LOW (ref 26.0–34.0)
MCHC: 30.6 g/dL (ref 30.0–36.0)
MCV: 74.9 fL — ABNORMAL LOW (ref 80.0–100.0)
Platelets: 391 10*3/uL (ref 150–400)
RBC: 3.79 MIL/uL — ABNORMAL LOW (ref 3.87–5.11)
RDW: 17.7 % — ABNORMAL HIGH (ref 11.5–15.5)
WBC: 7.6 10*3/uL (ref 4.0–10.5)
nRBC: 0 % (ref 0.0–0.2)

## 2020-05-12 LAB — BASIC METABOLIC PANEL
Anion gap: 10 (ref 5–15)
BUN: 7 mg/dL — ABNORMAL LOW (ref 8–23)
CO2: 25 mmol/L (ref 22–32)
Calcium: 8.7 mg/dL — ABNORMAL LOW (ref 8.9–10.3)
Chloride: 100 mmol/L (ref 98–111)
Creatinine, Ser: 0.61 mg/dL (ref 0.44–1.00)
GFR, Estimated: >60 mL/min (ref >60)
Glucose, Bld: 116 mg/dL — ABNORMAL HIGH (ref 70–99)
Potassium: 3.7 mmol/L (ref 3.5–5.1)
Sodium: 135 mmol/L (ref 135–145)

## 2020-05-12 LAB — GLUCOSE, CAPILLARY
Glucose-Capillary: 102 mg/dL — ABNORMAL HIGH (ref 70–99)
Glucose-Capillary: 112 mg/dL — ABNORMAL HIGH (ref 70–99)
Glucose-Capillary: 114 mg/dL — ABNORMAL HIGH (ref 70–99)
Glucose-Capillary: 121 mg/dL — ABNORMAL HIGH (ref 70–99)

## 2020-05-12 MED ORDER — POLYETHYLENE GLYCOL 3350 17 G PO PACK: 17.0000 g | PACK | Freq: Every day | ORAL | 0 refills | Status: DC | PRN

## 2020-05-12 MED ORDER — LIDOCAINE 5 % EX PTCH: 1.0000 | MEDICATED_PATCH | CUTANEOUS | 0 refills | Status: DC

## 2020-05-12 MED ORDER — NICOTINE 21 MG/24HR TD PT24: MEDICATED_PATCH | TRANSDERMAL | 0 refills | Status: DC

## 2020-05-12 MED ORDER — HYDROCODONE-ACETAMINOPHEN 5-325 MG PO TABS: 1.0000 | ORAL_TABLET | Freq: Four times a day (QID) | ORAL | 0 refills | Status: DC | PRN

## 2020-05-12 NOTE — Discharge Instructions (Signed)
Can use salonpas 4% if lidoderm patch not covered

## 2020-05-12 NOTE — Plan of Care (Signed)
  Problem: Education: Goal: Knowledge of General Education information will improve Description: Including pain rating scale, medication(s)/side effects and non-pharmacologic comfort measures Outcome: Adequate for Discharge   Problem: Clinical Measurements: Goal: Ability to maintain clinical measurements within normal limits will improve Outcome: Adequate for Discharge Goal: Will remain free from infection Outcome: Adequate for Discharge   Problem: Activity: Goal: Risk for activity intolerance will decrease Outcome: Adequate for Discharge   Problem: Elimination: Goal: Will not experience complications related to bowel motility Outcome: Adequate for Discharge Goal: Will not experience complications related to urinary retention Outcome: Adequate for Discharge   Problem: Pain Managment: Goal: General experience of comfort will improve Outcome: Adequate for Discharge   Problem: Safety: Goal: Ability to remain free from injury will improve Outcome: Adequate for Discharge   Problem: Skin Integrity: Goal: Risk for impaired skin integrity will decrease Outcome: Adequate for Discharge

## 2020-05-12 NOTE — TOC Initial Note (Signed)
Transition of Care [facility]) - Initial/Assessment Note    Patient Details  Name: Marissa Fletcher MRN: [id number] Date of Birth: 27-Dec-1940  [facility]) CM/SW Contact:    Shelbie Ammons, RN Phone Number: 05/12/2020, 9:02 AM  Clinical Narrative:   RNCM met with patient in room. Patient sitting up in bed, preparing to eat breakfast. Patient reports to feeling better this morning and is glad to be going home. Patient reports that she lives alone on a ground level apartment and that her ex-daughter in law will be picking her up from the hospital. Patient is agreeable to home health PT and OT and does not have a preference as to agency.  RNCM reached out to [facility] with [facility] and he will accept referral for home health.  RNCM reached out to [facility] with [employer] for rolling walker.                 Expected Discharge Plan: [place] Barriers to Discharge: No Barriers Identified   Patient Goals and CMS Choice        Expected Discharge Plan and Services Expected Discharge Plan: [place]       Living arrangements for the past 2 months: Single Family Home Expected Discharge Date: 05/12/20               DME Arranged: [facility]ford Rile rolling DME Agency: [employer] Date DME Agency Contacted: 05/12/20 Time DME Agency Contacted: 0901 Representative spoke with at DME Agency: [place]: PT,OT [place] Agency: [employer] ([place]) Date [place]: 05/12/20 Time HH Agency Contacted: 0901 Representative spoke with at [place]: Corene Cornea  Prior Living Arrangements/Services Living arrangements for the past 2 months: [place] with:: Self Patient language and need for interpreter reviewed:: Yes Do you feel safe going back to the place where you live?: Yes      Need for Family Participation in Patient Care: Yes (Comment) Care giver support system in place?: Yes (comment)   Criminal Activity/Legal Involvement Pertinent  to Current Situation/Hospitalization: No - Comment as needed  Activities of Daily Living Home Assistive Devices/Equipment: None ADL Screening (condition at time of admission) Patient's cognitive ability adequate to safely complete daily activities?: Yes Is the patient deaf or have difficulty hearing?: No Does the patient have difficulty seeing, even when wearing glasses/contacts?: No Does the patient have difficulty concentrating, remembering, or making decisions?: No Patient able to express need for assistance with ADLs?: Yes Does the patient have difficulty dressing or bathing?: Yes Independently performs ADLs?: Yes (appropriate for developmental age) Does the patient have difficulty walking or climbing stairs?: No Weakness of Legs: None Weakness of Arms/Hands: None  Permission Sought/Granted                  Emotional Assessment Appearance:: Appears stated age Attitude/Demeanor/Rapport: Engaged Affect (typically observed): Appropriate,Calm Orientation: : Oriented to Self,Oriented to Place,Oriented to  Time,Oriented to Situation Alcohol / Substance Use: Not Applicable Psych Involvement: No (comment)  Admission diagnosis:  Back pain [M54.9] Pulmonary nodule [R91.1] AAA (abdominal aortic aneurysm) without rupture [facility]) [I71.4] Patient Active Problem List   Diagnosis Date Noted  . Back pain 05/10/2020  . Diabetes mellitus without complication ([place])   . Hypertension   . Microcytic anemia   . Lung nodule seen on imaging study   . Depression with anxiety   . Tobacco abuse    PCP:  Juluis Pitch, MD Pharmacy:   [facility] [phone] -  [place], [place] [address] [place] [address] Phone: [phone] Fax: [phone]     Social Determinants of Health (SDOH) Interventions    Readmission Risk Interventions No flowsheet data found.

## 2020-05-12 NOTE — Discharge Summary (Signed)
[facility] NAME: Marissa Fletcher    MR#:  [id number]  DATE OF BIRTH:  04/01/1941  DATE OF ADMISSION:  05/10/2020 ADMITTING PHYSICIAN: Shelly Coss, MD  DATE OF DISCHARGE: 05/12/2020  1:53 PM  PRIMARY CARE PHYSICIAN: Juluis Pitch, MD    ADMISSION DIAGNOSIS:  Back pain [M54.9] Pulmonary nodule [R91.1] AAA (abdominal aortic aneurysm) without rupture (HCC) [I71.4]  DISCHARGE DIAGNOSIS:  Principal Problem:   Back pain Active Problems:   Diabetes mellitus without complication (HCC)   Hypertension   Microcytic anemia   Lung nodule seen on imaging study   Depression with anxiety   Tobacco abuse   SECONDARY DIAGNOSIS:   Past Medical History:  Diagnosis Date  . Diabetes mellitus without complication ([place])   . Hypertension     HOSPITAL COURSE:   1.  Acute on chronic low back pain.  Patient states that her back pain is better and she is ready to go home.  She states she takes aspirin 325 mg a day which would help out with inflammatory process.  CT scan of the lumbar spine showed degenerative changes.  Lidoderm patch prescribed.  Patient can use Salonpas over-the-counter if Lidoderm patch is not covered.  The patient asked for few pain pills to go home with. 2.  Iron deficiency anemia.  The patient received Feraheme yesterday.  Patient states that she has no sign of bleeding.  Will refer to hematology as outpatient. 3.  Right pulmonary nodule 1.8 cm in the right upper lobe.  Will refer to oncology as outpatient.  Patient may require a PET CT scan since she has a history of smoking. 4.  Type 2 diabetes mellitus with hyperlipidemia.  With hemoglobin A1c of 6.7.  Diet controlled diabetes.  Continue atorvastatin.  Can go back on Metformin as outpatient. 5.  Diabetic neuropathy on Neurontin. 6.  Anxiety depression on Paxil and Xanax 7.  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  Seen by vascular surgery and can be referred  over to [facility] for stent grafting if this enlarges.  This is not the cause of the patient's back pain. 8.  Essential hypertension on lisinopril HCT  DISCHARGE CONDITIONS:   Satisfactory  CONSULTS OBTAINED:  Treatment Team:  Katha Cabal, MD  DRUG ALLERGIES:   Allergies  Allergen Reactions  . Sulfa Antibiotics Swelling    DISCHARGE MEDICATIONS:   Allergies as of 05/12/2020      Reactions   Sulfa Antibiotics Swelling      Medication List    TAKE these medications   ALPRAZolam 0.25 MG tablet Commonly known as: XANAX Take 0.25 mg by mouth daily as needed for anxiety.   atorvastatin 20 MG tablet Commonly known as: LIPITOR Take 20 mg by mouth daily.   doxepin 25 MG capsule Commonly known as: SINEQUAN Take 50-75 mg by mouth at bedtime.   gabapentin 300 MG capsule Commonly known as: NEURONTIN Take 300 mg by mouth daily.   HYDROcodone-acetaminophen 5-325 MG tablet Commonly known as: NORCO/VICODIN Take 1 tablet by mouth every 6 (six) hours as needed for severe pain.   lidocaine 5 % Commonly known as: LIDODERM Place 1 patch onto the skin daily. Remove & Discard patch within 12 hours or as directed by MD   lisinopril-hydrochlorothiazide 20-12.5 MG tablet Commonly known as: ZESTORETIC Take 1 tablet by mouth daily.   metFORMIN 500 MG 24 hr tablet Commonly known as: GLUCOPHAGE-XR Take 500 mg by mouth 2 (two) times  daily.   nicotine 21 mg/24hr patch Commonly known as: NICODERM CQ - dosed in mg/24 hours One 21 mg patch chest wall daily (okay to substitute generic)   PARoxetine 30 MG tablet Commonly known as: PAXIL Take 30 mg by mouth daily.   polyethylene glycol 17 g packet Commonly known as: MIRALAX / GLYCOLAX Take 17 g by mouth daily as needed for moderate constipation.            Durable Medical Equipment  (From admission, onward)         Start     Ordered   05/12/20 0846  For home use only DME Walker rolling  Once       Question Answer Comment   Walker: With 5 Inch Wheels   Patient needs a walker to treat with the following condition Back pain      05/12/20 0845           DISCHARGE INSTRUCTIONS:   Follow-up PCP 5 days Follow-up hematology oncology in a few weeks  If you experience worsening of your admission symptoms, develop shortness of breath, life threatening emergency, suicidal or homicidal thoughts you must seek medical attention immediately by calling 911 or calling your MD immediately  if symptoms less severe.  You Must read complete instructions/literature along with all the possible adverse reactions/side effects for all the Medicines you take and that have been prescribed to you. Take any new Medicines after you have completely understood and accept all the possible adverse reactions/side effects.   Please note  You were cared for by a hospitalist during your hospital stay. If you have any questions about your discharge medications or the care you received while you were in the hospital after you are discharged, you can call the unit and asked to speak with the hospitalist on call if the hospitalist that took care of you is not available. Once you are discharged, your primary care physician will handle any further medical issues. Please note that NO REFILLS for any discharge medications will be authorized once you are discharged, as it is imperative that you return to your primary care physician (or establish a relationship with a primary care physician if you do not have one) for your aftercare needs so that they can reassess your need for medications and monitor your lab values.    Today   CHIEF COMPLAINT:   Chief Complaint  Patient presents with  . Back Pain    HISTORY OF PRESENT ILLNESS:  Marissa Fletcher  is a 79 y.o. female came in with back pain   VITAL SIGNS:  Blood pressure 124/78, pulse 86, temperature 98.1 F (36.7 C), temperature source Oral, resp. rate 16, height 5\' 4"  (1.626 m), weight 72.6 kg,  SpO2 93 %.  I/O:    Intake/Output Summary (Last 24 hours) at 05/12/2020 1635 Last data filed at 05/12/2020 0550 Gross per 24 hour  Intake 220 ml  Output 850 ml  Net -630 ml    PHYSICAL EXAMINATION:  GENERAL:  79 y.o.-year-old patient lying in the bed with no acute distress.  EYES: Pupils equal, round, reactive to light and accommodation. No scleral icterus. HEENT: Head atraumatic, normocephalic. Oropharynx and nasopharynx clear.  LUNGS: Normal breath sounds bilaterally, no wheezing, rales,rhonchi or crepitation. No use of accessory muscles of respiration.  CARDIOVASCULAR: S1, S2 normal. No murmurs, rubs, or gallops.  ABDOMEN: Soft, non-tender, non-distended. EXTREMITIES: No pedal edema, cyanosis, or clubbing.  NEUROLOGIC: Cranial nerves II through XII are intact. Muscle strength 5/5  in all extremities. Sensation intact. Gait not checked.  PSYCHIATRIC: The patient is alert and oriented x 3.  SKIN: No obvious rash, lesion, or ulcer.   DATA REVIEW:   CBC Recent Labs  Lab 05/12/20 0444  WBC 7.6  HGB 8.7*  HCT 28.4*  PLT 391    Chemistries  Recent Labs  Lab 05/10/20 1537 05/11/20 0422 05/12/20 0444  NA 134*   < > 135  K 3.9   < > 3.7  CL 98   < > 100  CO2 25   < > 25  GLUCOSE 117*   < > 116*  BUN 12   < > 7*  CREATININE 0.83   < > 0.61  CALCIUM 8.7*   < > 8.7*  AST 25  --   --   ALT 16  --   --   ALKPHOS 125  --   --   BILITOT 0.5  --   --    < > = values in this interval not displayed.    Microbiology Results  Results for orders placed or performed during the hospital encounter of 05/10/20  Resp Panel by RT-PCR (Flu A&B, Covid) Nasopharyngeal Swab     Status: None   Collection Time: 05/10/20  9:15 PM   Specimen: Nasopharyngeal Swab; Nasopharyngeal(NP) swabs in vial transport medium  Result Value Ref Range Status   SARS Coronavirus 2 by RT PCR NEGATIVE NEGATIVE Final    Comment: (NOTE) SARS-CoV-2 target nucleic acids are NOT DETECTED.  The SARS-CoV-2 RNA  is generally detectable in upper respiratory specimens during the acute phase of infection. The lowest concentration of SARS-CoV-2 viral copies this assay can detect is 138 copies/mL. A negative result does not preclude SARS-Cov-2 infection and should not be used as the sole basis for treatment or other patient management decisions. A negative result may occur with  improper specimen collection/handling, submission of specimen other than nasopharyngeal swab, presence of viral mutation(s) within the areas targeted by this assay, and inadequate number of viral copies(<138 copies/mL). A negative result must be combined with clinical observations, patient history, and epidemiological information. The expected result is Negative.  Fact Sheet for Patients:  EntrepreneurPulse.com.au  Fact Sheet for Healthcare Providers:  IncredibleEmployment.be  This test is no t yet approved or cleared by the [place] FDA and  has been authorized for detection and/or diagnosis of SARS-CoV-2 by FDA under an Emergency Use Authorization (EUA). This EUA will remain  in effect (meaning this test can be used) for the duration of the COVID-19 declaration under Section 564(b)(1) of the Act, 21 U.S.C.section 360bbb-3(b)(1), unless the authorization is terminated  or revoked sooner.       Influenza A by PCR NEGATIVE NEGATIVE Final   Influenza B by PCR NEGATIVE NEGATIVE Final    Comment: (NOTE) The Xpert Xpress SARS-CoV-2/FLU/RSV plus assay is intended as an aid in the diagnosis of influenza from Nasopharyngeal swab specimens and should not be used as a sole basis for treatment. Nasal washings and aspirates are unacceptable for Xpert Xpress SARS-CoV-2/FLU/RSV testing.  Fact Sheet for Patients: EntrepreneurPulse.com.au  Fact Sheet for Healthcare Providers: IncredibleEmployment.be  This test is not yet approved or cleared by the  [place] FDA and has been authorized for detection and/or diagnosis of SARS-CoV-2 by FDA under an Emergency Use Authorization (EUA). This EUA will remain in effect (meaning this test can be used) for the duration of the COVID-19 declaration under Section 564(b)(1) of the Act, 21 U.S.C. section 360bbb-3(b)(1),  unless the authorization is terminated or revoked.  Performed at [facility], [address] [place], [place] [address]     RADIOLOGY:  CT L-SPINE NO CHARGE  Result Date: 05/10/2020 CLINICAL DATA:  Chest pain and back pain, aortic dissection suspected. Lower back pain worse on the right. Current everyday smoker. EXAM: CT ANGIOGRAPHY CHEST, ABDOMEN AND PELVIS CT LUMBAR SPINE WITHOUT CONTRAST TECHNIQUE: Non-contrast CT of the chest was initially obtained. Multidetector CT imaging through the chest, abdomen and pelvis was performed using the standard protocol during bolus administration of intravenous contrast. Multiplanar reconstructed images and MIPs were obtained and reviewed to evaluate the vascular anatomy. Multidetector CT imaging of the lumbar spine was performed without intravenous contrast administration. Multiplanar CT image reconstructions were also generated. CONTRAST:  175mL OMNIPAQUE IOHEXOL 350 MG/ML SOLN COMPARISON:  X-ray lumbar spine 05/10/2020. FINDINGS: VASCULAR Aorta: Aortic root calcifications noted. Extensive calcified and noncalcified atherosclerotic. There is greater than 50% narrowing of the descending thoracic aorta due to noncalcified plaque (5:44, 68) and similarly of the infrarenal abdominal aorta (5: 108). The ascending aorta is normal in caliber. There is aneurysmal dilatation of the distal descending thoracic aorta which measures up to 4 cm in caliber (8:140) and extends approximately 5 cm in the craniocaudal dimension. No associated periaortic fat stranding. No dissection or vasculitis of the thoracic aorta. No aneurysm, dissection, vasculitis of  the abdominal aorta. Coronary arteries: Least mild four-vessel coronary artery calcifications. Pulmonary artery: The main pulmonary artery is normal in caliber. No central pulmonary embolus. Celiac: Mild atherosclerotic plaque. Patent without evidence of aneurysm, dissection, vasculitis or significant stenosis. SMA: Mild atherosclerotic plaque. Patent without evidence of aneurysm, dissection, vasculitis or significant stenosis. Renals: Total of 2 left renal arteries. A single right renal artery noted. Mild atherosclerotic plaque. Patent without evidence of aneurysm, dissection, vasculitis or significant stenosis. IMA: Mild atherosclerotic plaque. Patent without evidence of aneurysm, dissection, vasculitis or significant stenosis. Inflow: Moderate atherosclerotic plaque. Patent without evidence of aneurysm, dissection, vasculitis or significant stenosis. Veins: No obvious venous abnormality within the limitations of this arterial phase study. NON-VASCULAR CTA CHEST FINDINGS Cardiovascular: Preferential opacification of the thoracic aorta. Normal heart size. Suggestion of mild mitral annular calcifications. No significant pericardial effusion. Mediastinum/Nodes: There is an enlarged 1 cm right hilar lymph node (5:37). No left hilar lymphadenopathy. There is an enlarged precarinal lymph node measuring up to 1 cm (5:31). No axillary lymph nodes. Thyroid gland, trachea, and esophagus demonstrate no significant findings. Tiny hiatal hernia. Lungs/Pleura: Biapical pleural/pulmonary scarring. There is a spiculated 1.8 x 1.3 x 1.6 cm right upper lobe pulmonary nodule (6:56) with associated tethering of minor fissure. Right upper lobe subpleural micronodule (6:56). A 0.5 cm nodular like subpleural densities noted within the right apex (6:21, 8:145). No pulmonary mass. No focal consolidation. Bilateral lower lobe subsegmental atelectasis. Biapical pleural/pulmonary scarring. No pleural effusion. No pneumothorax.  Musculoskeletal: No chest wall abnormality No suspicious lytic or blastic osseous lesions. No acute displaced rib fracture. Chronic appearing compression fracture of the T6 and T7 vertebral bodies. Multilevel degenerative changes of the spine. CTA ABDOMEN AND PELVIS FINDINGS Hepatobiliary: The liver is enlarged measuring up to 20 cm. No focal liver abnormality is seen. Status post cholecystectomy. No biliary dilatation. Pancreas: No focal lesion. Normal pancreatic contour. No surrounding inflammatory changes. No main pancreatic ductal dilatation. Spleen: Heterogeneous appearance of the spleen consistent with arterial phase. Normal in size without focal abnormality. Adrenals/Urinary Tract: No adrenal nodule bilaterally. Bilateral kidneys enhance symmetrically. No hydronephrosis. No hydroureter. The  urinary bladder is unremarkable. Stomach/Bowel: Stomach is within normal limits. No evidence of bowel wall thickening or dilatation. Sigmoid diverticulosis. The appendix is not definitely identified. Lymphatic: No lymphadenopathy. Reproductive: Status post hysterectomy. No adnexal masses. Other: No intraperitoneal free fluid. No intraperitoneal free gas. No organized fluid collection. Musculoskeletal: No abdominal wall hernia or abnormality. No suspicious lytic or blastic osseous lesions. No acute displaced fracture of the pelvis or sacrum. Multilevel degenerative changes of the spine. CT LUMPAR SPINE: Segmentation: 6 non-rib-bearing lumbar vertebral bodies. To keep with nomenclature for prior x-ray, lower most vertebra is numbered S1. Alignment: Similar-appearing grade 1 anterolisthesis of L5 on S1. Slight dextrocurvature of the lower lumbar spine. Vertebra/Intervertebral disc space: Similar-appearing severe degenerative changes at L2-L3 level and S1 level with intervertebral disc space vacuum phenomenon and endplate sclerosis. Facet arthropathy noted at the L5-S1 level. No severe neural foraminal stenosis. No severe  central canal stenosis. Paraspinal and other soft tissues: Negative. Review of the MIP images confirms the above findings. IMPRESSION: VASCULAR: 1. 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 No CT findings to suggest imminent rupture. 2. Extensive calcified and noncalcified atherosclerotic plaque of the thoracic and abdominal aorta with area of atherosclerotic plaque leading to greater than 50% narrowing of the lumen. Aortic Atherosclerosis (ICD10-I70.0). NONVASCULAR: 1. A 1.8 cm right upper lobe pulmonary nodule. Indeterminate right hilar and precarinal borderline enlarged lymphadenopathy. Findings concerning for malignancy. Additional imaging evaluation or consultation with Pulmonology or Thoracic Surgery recommended. 2. Hepatomegaly. 3. Sigmoid diverticulosis no acute diverticulitis. LUMPAR SPINE: 1. No acute displaced fracture or traumatic listhesis of the lumbar spine. Electronically Signed   By: Iven Finn M.D.   On: 05/10/2020 19:02   CT Angio Chest/Abd/Pel for Dissection W and/or Wo Contrast  Result Date: 05/10/2020 CLINICAL DATA:  Chest pain and back pain, aortic dissection suspected. Lower back pain worse on the right. Current everyday smoker. EXAM: CT ANGIOGRAPHY CHEST, ABDOMEN AND PELVIS CT LUMBAR SPINE WITHOUT CONTRAST TECHNIQUE: Non-contrast CT of the chest was initially obtained. Multidetector CT imaging through the chest, abdomen and pelvis was performed using the standard protocol during bolus administration of intravenous contrast. Multiplanar reconstructed images and MIPs were obtained and reviewed to evaluate the vascular anatomy. Multidetector CT imaging of the lumbar spine was performed without intravenous contrast administration. Multiplanar CT image reconstructions were also generated. CONTRAST:  183mL OMNIPAQUE IOHEXOL 350 MG/ML SOLN COMPARISON:  X-ray lumbar spine 05/10/2020. FINDINGS: VASCULAR Aorta: Aortic root calcifications noted.  Extensive calcified and noncalcified atherosclerotic. There is greater than 50% narrowing of the descending thoracic aorta due to noncalcified plaque (5:44, 68) and similarly of the infrarenal abdominal aorta (5: 108). The ascending aorta is normal in caliber. There is aneurysmal dilatation of the distal descending thoracic aorta which measures up to 4 cm in caliber (8:140) and extends approximately 5 cm in the craniocaudal dimension. No associated periaortic fat stranding. No dissection or vasculitis of the thoracic aorta. No aneurysm, dissection, vasculitis of the abdominal aorta. Coronary arteries: Least mild four-vessel coronary artery calcifications. Pulmonary artery: The main pulmonary artery is normal in caliber. No central pulmonary embolus. Celiac: Mild atherosclerotic plaque. Patent without evidence of aneurysm, dissection, vasculitis or significant stenosis. SMA: Mild atherosclerotic plaque. Patent without evidence of aneurysm, dissection, vasculitis or significant stenosis. Renals: Total of 2 left renal arteries. A single right renal artery noted. Mild atherosclerotic plaque. Patent without evidence of aneurysm, dissection, vasculitis or significant stenosis. IMA: Mild atherosclerotic plaque. Patent without evidence of aneurysm, dissection,  vasculitis or significant stenosis. Inflow: Moderate atherosclerotic plaque. Patent without evidence of aneurysm, dissection, vasculitis or significant stenosis. Veins: No obvious venous abnormality within the limitations of this arterial phase study. NON-VASCULAR CTA CHEST FINDINGS Cardiovascular: Preferential opacification of the thoracic aorta. Normal heart size. Suggestion of mild mitral annular calcifications. No significant pericardial effusion. Mediastinum/Nodes: There is an enlarged 1 cm right hilar lymph node (5:37). No left hilar lymphadenopathy. There is an enlarged precarinal lymph node measuring up to 1 cm (5:31). No axillary lymph nodes. Thyroid gland,  trachea, and esophagus demonstrate no significant findings. Tiny hiatal hernia. Lungs/Pleura: Biapical pleural/pulmonary scarring. There is a spiculated 1.8 x 1.3 x 1.6 cm right upper lobe pulmonary nodule (6:56) with associated tethering of minor fissure. Right upper lobe subpleural micronodule (6:56). A 0.5 cm nodular like subpleural densities noted within the right apex (6:21, 8:145). No pulmonary mass. No focal consolidation. Bilateral lower lobe subsegmental atelectasis. Biapical pleural/pulmonary scarring. No pleural effusion. No pneumothorax. Musculoskeletal: No chest wall abnormality No suspicious lytic or blastic osseous lesions. No acute displaced rib fracture. Chronic appearing compression fracture of the T6 and T7 vertebral bodies. Multilevel degenerative changes of the spine. CTA ABDOMEN AND PELVIS FINDINGS Hepatobiliary: The liver is enlarged measuring up to 20 cm. No focal liver abnormality is seen. Status post cholecystectomy. No biliary dilatation. Pancreas: No focal lesion. Normal pancreatic contour. No surrounding inflammatory changes. No main pancreatic ductal dilatation. Spleen: Heterogeneous appearance of the spleen consistent with arterial phase. Normal in size without focal abnormality. Adrenals/Urinary Tract: No adrenal nodule bilaterally. Bilateral kidneys enhance symmetrically. No hydronephrosis. No hydroureter. The urinary bladder is unremarkable. Stomach/Bowel: Stomach is within normal limits. No evidence of bowel wall thickening or dilatation. Sigmoid diverticulosis. The appendix is not definitely identified. Lymphatic: No lymphadenopathy. Reproductive: Status post hysterectomy. No adnexal masses. Other: No intraperitoneal free fluid. No intraperitoneal free gas. No organized fluid collection. Musculoskeletal: No abdominal wall hernia or abnormality. No suspicious lytic or blastic osseous lesions. No acute displaced fracture of the pelvis or sacrum. Multilevel degenerative changes of  the spine. CT LUMPAR SPINE: Segmentation: 6 non-rib-bearing lumbar vertebral bodies. To keep with nomenclature for prior x-ray, lower most vertebra is numbered S1. Alignment: Similar-appearing grade 1 anterolisthesis of L5 on S1. Slight dextrocurvature of the lower lumbar spine. Vertebra/Intervertebral disc space: Similar-appearing severe degenerative changes at L2-L3 level and S1 level with intervertebral disc space vacuum phenomenon and endplate sclerosis. Facet arthropathy noted at the L5-S1 level. No severe neural foraminal stenosis. No severe central canal stenosis. Paraspinal and other soft tissues: Negative. Review of the MIP images confirms the above findings. IMPRESSION: VASCULAR: 1. 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 No CT findings to suggest imminent rupture. 2. Extensive calcified and noncalcified atherosclerotic plaque of the thoracic and abdominal aorta with area of atherosclerotic plaque leading to greater than 50% narrowing of the lumen. Aortic Atherosclerosis (ICD10-I70.0). NONVASCULAR: 1. A 1.8 cm right upper lobe pulmonary nodule. Indeterminate right hilar and precarinal borderline enlarged lymphadenopathy. Findings concerning for malignancy. Additional imaging evaluation or consultation with Pulmonology or Thoracic Surgery recommended. 2. Hepatomegaly. 3. Sigmoid diverticulosis no acute diverticulitis. LUMPAR SPINE: 1. No acute displaced fracture or traumatic listhesis of the lumbar spine. Electronically Signed   By: Iven Finn M.D.   On: 05/10/2020 19:02      Management plans discussed with the patient, and she is agreement.  Patient deferred me calling family at this time.  CODE STATUS:     Code Status  Orders  (From admission, onward)         Start     Ordered   05/10/20 1957  Full code  Continuous        05/10/20 1959        Code Status History    This patient has a current code status but no historical code status.    Advance Care Planning Activity    Advance Directive Documentation   Flowsheet Row Most Recent Value  Type of Advance Directive Healthcare Power of Attorney, Living will  Pre-existing out of facility DNR order (yellow form or pink MOST form) --  "MOST" Form in Place? --      TOTAL TIME TAKING CARE OF THIS PATIENT: 35 minutes.    Loletha Grayer M.D on 05/12/2020 at 4:35 PM  Between 7am to 6pm - Pager - [phone]  After 6pm go to [email or web address] - password [email or web address]  [facility]  CC: Primary care physician; Juluis Pitch, MD

## 2020-07-01 ENCOUNTER — Inpatient Hospital Stay: Payer: Medicare Other | Attending: Oncology | Admitting: Oncology

## 2020-07-01 ENCOUNTER — Encounter: Payer: Self-pay | Admitting: Oncology

## 2020-07-01 ENCOUNTER — Inpatient Hospital Stay: Payer: Medicare Other

## 2020-07-01 VITALS — BP 111/76 | HR 105 | Temp 98.3°F | Resp 18 | Wt 148.4 lb

## 2020-07-01 DIAGNOSIS — D509 Iron deficiency anemia, unspecified: Secondary | ICD-10-CM

## 2020-07-01 DIAGNOSIS — R911 Solitary pulmonary nodule: Secondary | ICD-10-CM

## 2020-07-01 DIAGNOSIS — F17210 Nicotine dependence, cigarettes, uncomplicated: Secondary | ICD-10-CM | POA: Diagnosis not present

## 2020-07-01 DIAGNOSIS — Z720 Tobacco use: Secondary | ICD-10-CM

## 2020-07-01 DIAGNOSIS — Z79899 Other long term (current) drug therapy: Secondary | ICD-10-CM | POA: Insufficient documentation

## 2020-07-01 DIAGNOSIS — I10 Essential (primary) hypertension: Secondary | ICD-10-CM | POA: Insufficient documentation

## 2020-07-01 DIAGNOSIS — E119 Type 2 diabetes mellitus without complications: Secondary | ICD-10-CM | POA: Insufficient documentation

## 2020-07-01 DIAGNOSIS — R918 Other nonspecific abnormal finding of lung field: Secondary | ICD-10-CM

## 2020-07-01 NOTE — Progress Notes (Signed)
Hematology/Oncology Consult note [facility] [phone] [phone]   Patient Care Team: Juluis Pitch, MD as PCP - General (Family Medicine)  REFERRING PROVIDER: Loletha Grayer, MD  CHIEF COMPLAINTS/REASON FOR VISIT:  Evaluation of lung nodule  HISTORY OF PRESENTING ILLNESS:   Marissa Fletcher is a  80 y.o.  female with PMH listed below was seen in consultation at the request of  Loletha Grayer, MD  for evaluation of lung nodule  05/10/2020-05/12/2020, [name], indeterminate right hilar and precarinal borderline enlarged lymphadenopathy. Hepatomegaly.  Sigmoid diverticulosis with no diverticulitis.  Descending thoracic aorta aneurysm 4 cm.  Extensive calcified and noncalcified atherosclerotic plaque of the thoracic and abdominal aorta.  Patient was referred to cancer center for evaluation of lung mass. Patient is accompanied by her sister.  Patient is current everyday smoker, 34-pack-year smoking history. Report good appetite.  She denies any significant unintentional weight loss, night sweats, fever or chills. Mild chronic shortness of breath with exertion. Patient lives by herself Review of Systems  Constitutional: Negative for appetite change, chills, fatigue and fever.  HENT:   Negative for hearing loss and voice change.   Eyes: Negative for eye problems.  Respiratory: Negative for chest tightness and cough.   Cardiovascular: Negative for chest pain.  Gastrointestinal: Negative for abdominal distention, abdominal pain and blood in stool.  Endocrine: Negative for hot flashes.  Genitourinary: Negative for difficulty urinating and frequency.   Musculoskeletal: Negative for arthralgias.  Skin: Negative for itching and rash.  Neurological: Negative for extremity  weakness.  Hematological: Negative for adenopathy.  Psychiatric/Behavioral: Negative for confusion.    MEDICAL HISTORY:  Past Medical History:  Diagnosis Date  . Diabetes mellitus without complication ([place])   . Hypertension     SURGICAL HISTORY: Past Surgical History:  Procedure Laterality Date  . ABDOMINAL HYSTERECTOMY    . CHOLECYSTECTOMY      SOCIAL HISTORY: Social History   Socioeconomic History  . Marital status: Single    Spouse name: Not on file  . Number of children: Not on file  . Years of education: Not on file  . Highest education level: Not on file  Occupational History  . Not on file  Tobacco Use  . Smoking status: Current Every Day Smoker    Packs/day: 0.50    Years: 68.00    Pack years: 34.00  . Smokeless tobacco: Never Used  Substance and Sexual Activity  . Alcohol use: Never  . Drug use: Never  . Sexual activity: Not on file  Other Topics Concern  . Not on file  Social History Narrative  . Not on file   Social Determinants of Health   Financial Resource Strain: Not on file  Food Insecurity: Not on file  Transportation Needs: Not on file  Physical Activity: Not on file  Stress: Not on file  Social Connections: Not on file  Intimate Partner Violence: Not on file    FAMILY HISTORY: Family History  Problem Relation Age of Onset  . Dementia Mother   . Congestive Heart Failure Mother   . Bladder Cancer Father   . Heart disease Father   . Breast cancer Neg Hx     ALLERGIES:  is allergic to sulfa antibiotics.  MEDICATIONS:  Current Outpatient Medications  Medication Sig Dispense Refill  . atorvastatin (LIPITOR) 20  MG tablet Take 20 mg by mouth daily.    . diclofenac Sodium (VOLTAREN) 1 % GEL Apply topically.    Marland Kitchen doxepin (SINEQUAN) 25 MG capsule Take 50-75 mg by mouth at bedtime.    . gabapentin (NEURONTIN) 300 MG capsule Take 300 mg by mouth daily.    Marland Kitchen HYDROcodone-acetaminophen (NORCO/VICODIN) 5-325 MG tablet Take 1 tablet by mouth  every 6 (six) hours as needed for severe pain. 10 tablet 0  . lidocaine (LIDODERM) 5 % Place 1 patch onto the skin daily. Remove & Discard patch within 12 hours or as directed by MD 30 patch 0  . lisinopril-hydrochlorothiazide (ZESTORETIC) 20-12.5 MG tablet Take 1 tablet by mouth daily.    . metFORMIN (GLUCOPHAGE-XR) 500 MG 24 hr tablet Take 500 mg by mouth 2 (two) times daily.    . nicotine (NICODERM CQ - DOSED IN MG/24 HOURS) 21 mg/24hr patch One 21 mg patch chest wall daily (okay to substitute generic) 28 patch 0  . PARoxetine (PAXIL) 30 MG tablet Take 30 mg by mouth daily.    . polyethylene glycol (MIRALAX / GLYCOLAX) 17 g packet Take 17 g by mouth daily as needed for moderate constipation. 30 each 0  . ALPRAZolam (XANAX) 0.25 MG tablet Take 0.25 mg by mouth daily as needed for anxiety. (Patient not taking: Reported on 07/01/2020)     No current facility-administered medications for this visit.     PHYSICAL EXAMINATION: ECOG PERFORMANCE STATUS: 1 - Symptomatic but completely ambulatory Vitals:   07/01/20 1401  BP: 111/76  Pulse: (!) 105  Resp: 18  Temp: 98.3 F (36.8 C)   Filed Weights   07/01/20 1401  Weight: 148 lb 6.4 oz (67.3 kg)    Physical Exam Constitutional:      General: She is not in acute distress.    Comments: Elderly female who walks independently.  HENT:     Head: Normocephalic and atraumatic.  Eyes:     General: No scleral icterus. Cardiovascular:     Rate and Rhythm: Normal rate and regular rhythm.     Heart sounds: Normal heart sounds.  Pulmonary:     Effort: Pulmonary effort is normal. No respiratory distress.     Breath sounds: No wheezing.  Abdominal:     General: Bowel sounds are normal. There is no distension.     Palpations: Abdomen is soft.  Musculoskeletal:        General: No deformity. Normal range of motion.     Cervical back: Normal range of motion and neck supple.  Skin:    General: Skin is warm and dry.     Findings: No erythema or  rash.  Neurological:     Mental Status: She is alert and oriented to person, place, and time. Mental status is at baseline.     Cranial Nerves: No cranial nerve deficit.     Coordination: Coordination normal.  Psychiatric:        Mood and Affect: Mood normal.     LABORATORY DATA:  I have reviewed the data as listed Lab Results  Component Value Date   WBC 7.6 05/12/2020   HGB 8.7 (L) 05/12/2020   HCT 28.4 (L) 05/12/2020   MCV 74.9 (L) 05/12/2020   PLT 391 05/12/2020   Recent Labs    05/10/20 1537 05/11/20 0422 05/12/20 0444  NA 134* 137 135  K 3.9 3.4* 3.7  CL 98 101 100  CO2 25 26 25   GLUCOSE 117* 111* 116*  BUN 12  10 7*  CREATININE 0.83 0.75 0.61  CALCIUM 8.7* 8.6* 8.7*  GFRNONAA >60 >60 >60  PROT 6.8  --   --   ALBUMIN 3.4*  --   --   AST 25  --   --   ALT 16  --   --   ALKPHOS 125  --   --   BILITOT 0.5  --   --    Iron/TIBC/Ferritin/ %Sat    Component Value Date/Time   IRON 14 (L) 05/10/2020 1537   TIBC 382 05/10/2020 1537   FERRITIN 9 (L) 05/10/2020 1537   IRONPCTSAT 4 (L) 05/10/2020 1537      RADIOGRAPHIC STUDIES: I have personally reviewed the radiological images as listed and agreed with the findings in the report. No results found.    ASSESSMENT & PLAN:  1. Lung mass   2. Tobacco use   3. Iron deficiency anemia, unspecified iron deficiency anemia type    #CT images were independently reviewed by me and discussed with patient Findings are suspicious for primary lung neoplasm. Recommend patient to proceed with PET scan for further evaluation. Need of tissue diagnosis was discussed.  #Microcytic anemia, patient has iron deficiency.  I will obtain anemia work-up at the next visit. #  Orders Placed This Encounter  Procedures  . NM PET Image Initial (PI) Skull Base To Thigh    Standing Status:   Future    Standing Expiration Date:   07/01/2021    Order Specific Question:   If indicated for the ordered procedure, I authorize the  administration of a radiopharmaceutical per Radiology protocol    Answer:   Yes    Order Specific Question:   Preferred imaging location?    Answer:    [facility]    Order Specific Question:   Radiology Contrast Protocol - do NOT remove file path    Answer:   [email or web address]    All questions were answered. The patient knows to call the clinic with any problems questions or concerns.  cc Loletha Grayer, MD    Return of visit: To be determined Thank you for this kind referral and the opportunity to participate in the care of this patient. A copy of today's note is routed to referring provider    Earlie Server, MD, PhD Hematology Oncology [facility] Pager- [phone] 07/01/2020

## 2020-07-01 NOTE — Progress Notes (Signed)
Patient here for follow up

## 2020-07-13 ENCOUNTER — Other Ambulatory Visit: Payer: Self-pay

## 2020-07-13 ENCOUNTER — Ambulatory Visit
Admission: RE | Admit: 2020-07-13 | Discharge: 2020-07-13 | Disposition: A | Discharge status: Home / Self Care | Payer: Medicare Other | Source: Ambulatory Visit | Attending: Oncology | Admitting: Oncology

## 2020-07-13 DIAGNOSIS — I2510 Atherosclerotic heart disease of native coronary artery without angina pectoris: Secondary | ICD-10-CM | POA: Diagnosis not present

## 2020-07-13 DIAGNOSIS — R918 Other nonspecific abnormal finding of lung field: Secondary | ICD-10-CM | POA: Diagnosis present

## 2020-07-13 DIAGNOSIS — I700 Atherosclerosis of aorta: Secondary | ICD-10-CM | POA: Diagnosis not present

## 2020-07-13 DIAGNOSIS — R29890 Loss of height: Secondary | ICD-10-CM | POA: Insufficient documentation

## 2020-07-13 DIAGNOSIS — I712 Thoracic aortic aneurysm, without rupture: Secondary | ICD-10-CM | POA: Diagnosis not present

## 2020-07-13 DIAGNOSIS — J439 Emphysema, unspecified: Secondary | ICD-10-CM | POA: Insufficient documentation

## 2020-07-13 DIAGNOSIS — I714 Abdominal aortic aneurysm, without rupture: Secondary | ICD-10-CM | POA: Diagnosis not present

## 2020-07-13 LAB — GLUCOSE, CAPILLARY: Glucose-Capillary: 139 mg/dL — ABNORMAL HIGH (ref 70–99)

## 2020-07-13 IMAGING — CT NM PET TUM IMG INITIAL (PI) SKULL BASE T - THIGH
1 of 10 series · 1 of 25 positions shown · non-contrast
Comparison: None.

CLINICAL DATA: Initial treatment strategy for lung lesion
discovered on recent CT of the chest.

EXAM:
NUCLEAR MEDICINE PET SKULL BASE TO THIGH
TECHNIQUE: 8.0 mCi F-18 FDG was injected intravenously. Full-ring PET imaging
was performed from the skull base to thigh after the radiotracer. CT
data was obtained and used for attenuation correction and anatomic
localization.
Fasting blood glucose: 139 mg/dl

[Series 3: ct wb 5.0 b30f · axial · 5.0mm · 0.98mm/px · 1 of 290 slices shown]
[im 290/290  brain]
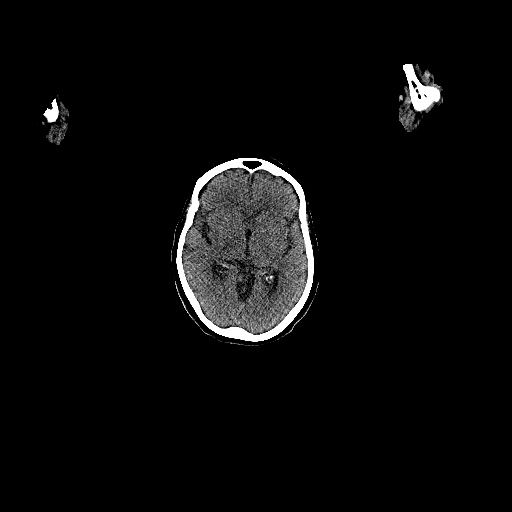

[1 of 25 positions shown; findings below may reference images not displayed]

FINDINGS: Mediastinal blood pool activity: SUV max

Liver activity: SUV max not applicable

NECK: No hypermetabolic lymph nodes in the neck.

Incidental CT findings: None

CHEST:

RIGHT upper lobe nodule, the area of concern with spiculated
features measuring approximately 1.9 x 1.8 cm with a maximum SUV of
9.4. No hypermetabolic lymph nodes. Background pulmonary emphysema.

Stable nodule measuring 3 mm peripheral to this on image 90 of
series 3 without associated FDG uptake.

Incidental CT findings:

Calcified atheromatous plaque of the thoracic aorta without
aneurysm. Normal caliber central pulmonary vessels. Limited
assessment of cardiovascular structures given lack of intravenous
contrast. Heart size stable. No pericardial effusion. Three-vessel
coronary artery disease. Normal appearance of the esophagus.

Pulmonary emphysema as described.

Descending thoracic aortic aneurysmal dilation up to 4 cm.

ABDOMEN/PELVIS: No abnormal hypermetabolic activity within the
liver, pancreas, adrenal glands, or spleen. No hypermetabolic lymph
nodes in the abdomen or pelvis. Adrenal glands are normal.

Incidental CT findings: Post cholecystectomy. Liver, spleen,
pancreas and adrenal glands without acute process. LEFT adrenal
adenoma measuring approximately 1 cm.

Mild cortical scarring of the kidneys. No hydronephrosis. Mild
distension of fluid-filled bowel loops in the abdomen with similar
appearance to previous imaging. Generalized uptake throughout the
colon. Fat deposition in the submucosal aspect of the colonic wall.
Colonic diverticulosis. Post hysterectomy.

Extensive abdominal aortic atherosclerosis with 3.3 x 3.1 cm
dilation of the infrarenal abdominal aorta.

SKELETON: No focal hypermetabolic activity to suggest skeletal
metastasis.

Incidental CT findings: Interval greater than 50% loss of height at
the T10 level as determined by the lowest rib-bearing vertebral
body. Mild increased metabolic activity in this location,
nonspecific in the setting of interval fracture. Upper thoracic and
midthoracic vertebral wedging at T7 and T6 with similar appearance.
Spinal degenerative changes.
IMPRESSION: Marked hypermetabolic activity associated with RIGHT upper lobe
nodule compatible with bronchogenic neoplasm given morphologic
features and metabolic activity. No adenopathy or distant metastatic
disease.

Small nonspecific pulmonary nodules without change or increased
metabolic activity.

Interval greater than 50% loss of height, compression fracture, of
the T10 vertebral body since [DATE], dedicated imaging may
be helpful particularly there is ongoing pain.

Diffuse colonic activity is nonspecific and may be physiologic.
Would however correlate with any signs of colitis.

Calcified coronary artery disease.

Thoracic and abdominal aortic aneurysms as described. For abdominal
follow-up. Recommend follow-up ultrasound every 3 years. This
recommendation follows ACR consensus guidelines: White Paper of the
ACR Incidental Findings Committee II on Vascular Findings. [HOSPITAL] [86]; [DATE]. could consider annual follow-up for
thoracic aortic dilation.

Aortic Atherosclerosis ([86]-[86]) and Emphysema ([86]-[86]).

Aortic aneurysm NOS ([86]-[86]).

These results will be called to the ordering clinician or
representative by the Radiologist Assistant, and communication
documented in the PACS or [REDACTED].

## 2020-07-13 MED ORDER — FLUDEOXYGLUCOSE F - 18 (FDG) INJECTION
7.7000 | Freq: Once | INTRAVENOUS | Status: AC | PRN
  Administered 2020-07-13: 8 via INTRAVENOUS

## 2020-07-14 ENCOUNTER — Telehealth: Payer: Self-pay | Admitting: *Deleted

## 2020-07-14 NOTE — Telephone Encounter (Signed)
Called report  IMPRESSION: Marked hypermetabolic activity associated with RIGHT upper lobe nodule compatible with bronchogenic neoplasm given morphologic features and metabolic activity. No adenopathy or distant metastatic disease.  Small nonspecific pulmonary nodules without change or increased metabolic activity.  Interval greater than 50% loss of height, compression fracture, of the T10 vertebral body since December of 2021, dedicated imaging may be helpful particularly there is ongoing pain.  Diffuse colonic activity is nonspecific and may be physiologic. Would however correlate with any signs of colitis.  Calcified coronary artery disease.  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 For abdominal follow-up. Recommend follow-up ultrasound every 3 years. This recommendation follows ACR consensus guidelines: White Paper of the ACR Incidental Findings Committee II on Vascular Findings. J Am Coll Radiol 2013; 10:789-794. could consider annual follow-up for thoracic aortic dilation.  Aortic Atherosclerosis (ICD10-I70.0) and Emphysema (ICD10-J43.9).  Aortic aneurysm NOS (ICD10-I71.9).  These results will be called to the ordering clinician or representative by the Radiologist Assistant, and communication documented in the PACS or [employer].   Electronically Signed   By: Zetta Bills M.D.   On: 07/14/2020 15:29

## 2020-07-15 ENCOUNTER — Telehealth: Payer: Self-pay | Admitting: *Deleted

## 2020-07-15 ENCOUNTER — Encounter: Payer: Self-pay | Admitting: *Deleted

## 2020-07-15 DIAGNOSIS — R918 Other nonspecific abnormal finding of lung field: Secondary | ICD-10-CM

## 2020-07-15 DIAGNOSIS — S22000A Wedge compression fracture of unspecified thoracic vertebra, initial encounter for closed fracture: Secondary | ICD-10-CM

## 2020-07-15 NOTE — Progress Notes (Signed)
  Oncology Nurse Navigator Documentation  Navigator Location: [facility] (07/15/20 0800)   )Navigator Encounter Type: Telephone (07/15/20 0800) Telephone: Marissa Fletcher Call (07/15/20 0800) Abnormal Finding Date: 05/10/20 (07/15/20 0800)                   Treatment Phase: Abnormal Scans (07/15/20 0800) Barriers/Navigation Needs: Coordination of Care (07/15/20 0800)   Interventions: Coordination of Care (07/15/20 0800)   Coordination of Care: Appts;Radiology (07/15/20 0800)     phone call made to patient to review PET scan results. Per Dr. Tasia Catchings will need biopsy of lung nodule and thoracic spine MRI w/wo contrast. Pt made aware of MD recommendations. Reviewed upcoming appt for MRI on 2/25 at 10am. Informed that she will be notified with biopsy date/time once scheduled. Contact info given and instructed to call with any further questions or needs. Pt verbalized understanding.             Time Spent with Patient: 30 (07/15/20 0800)

## 2020-07-15 NOTE — Progress Notes (Signed)
error 

## 2020-07-15 NOTE — Telephone Encounter (Addendum)
Patient called asking for results of her PET scan. Please return her call

## 2020-07-15 NOTE — Telephone Encounter (Signed)
I believe that Marissa Fletcher has talked to her

## 2020-07-16 ENCOUNTER — Other Ambulatory Visit: Payer: Self-pay

## 2020-07-16 ENCOUNTER — Ambulatory Visit
Admission: RE | Admit: 2020-07-16 | Discharge: 2020-07-16 | Disposition: A | Discharge status: Home / Self Care | Payer: Medicare Other | Source: Ambulatory Visit | Attending: Oncology | Admitting: Oncology

## 2020-07-16 DIAGNOSIS — R918 Other nonspecific abnormal finding of lung field: Secondary | ICD-10-CM

## 2020-07-16 DIAGNOSIS — S22000A Wedge compression fracture of unspecified thoracic vertebra, initial encounter for closed fracture: Secondary | ICD-10-CM | POA: Diagnosis present

## 2020-07-16 IMAGING — MR MR THORACIC SPINE WO/W CM
5 of 9 series · 26 of 48 positions shown · IV contrast (7ml Gadavist)
Comparison: PET-CT [DATE].  CT chest [DATE]

CLINICAL DATA: Evaluate T10 compression fracture identified on
recent PET-CT.

EXAM:
MRI THORACIC WITHOUT AND WITH CONTRAST
TECHNIQUE: Multiplanar and multiecho pulse sequences of the thoracic spine were
obtained without and with intravenous contrast.
CONTRAST:  7mL GADAVIST GADOBUTROL 1 MMOL/ML IV SOLN

[Series 16: T1 · sagittal · 5.0mm · 1.41mm/px · 3 of 9 slices shown (1 of 3)]
[im 1/9]
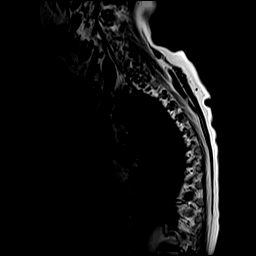
[im 5/9]
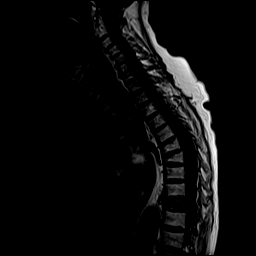
[im 9/9]
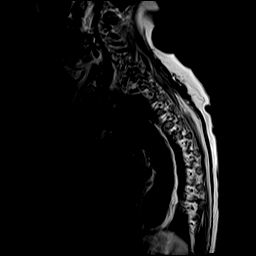

[Series 17: T2 · sagittal · 3.0mm · 1.06mm/px · 4 of 17 slices shown (1 of 2)]
[im 1/17]
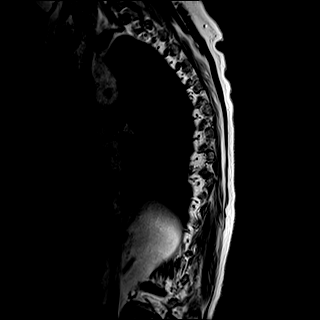
[im 6/17]
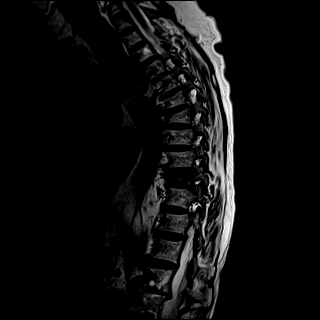
[im 11/17]
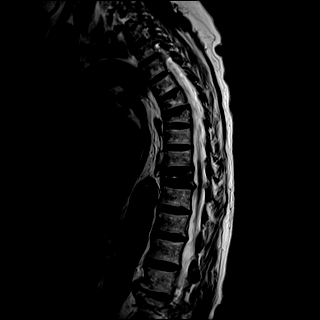
[im 17/17]
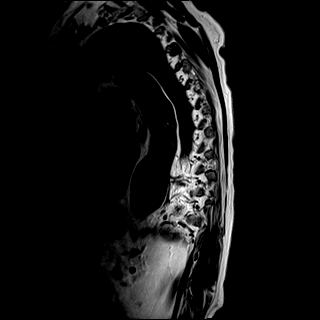

[Series 18: T1 · sagittal · 3.0mm · 1.06mm/px · 3 of 17 slices shown (2 of 3)]
[im 1/17]
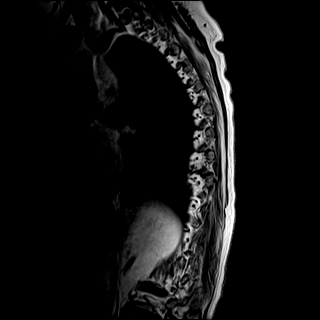
[im 9/17]
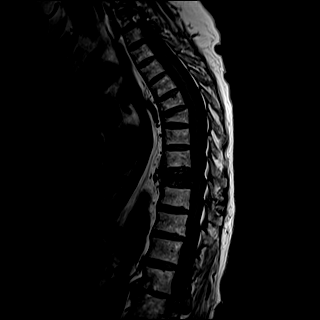
[im 17/17]
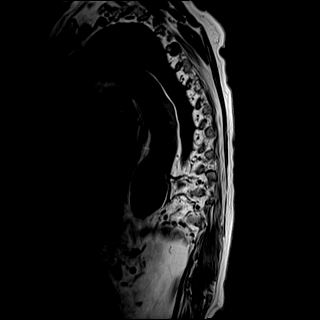

[Series 20: T2 · axial · 4.0mm · 0.59mm/px · z∈[-253,-39]mm · 8 of 42 slices shown (2 of 2)]
[im 1/42]
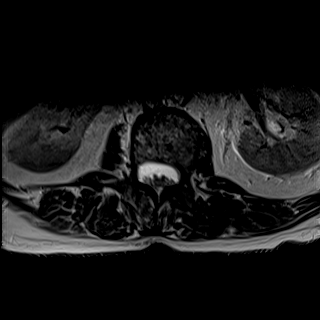
[im 6/42]
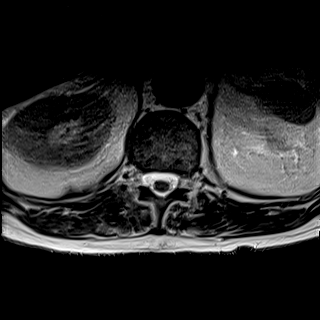
[im 12/42]
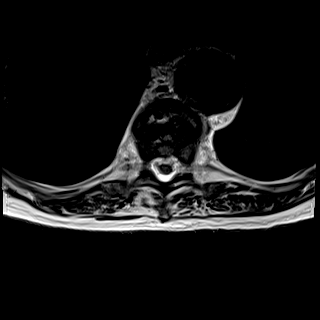
[im 18/42]
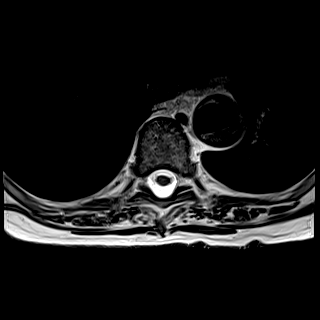
[im 24/42]
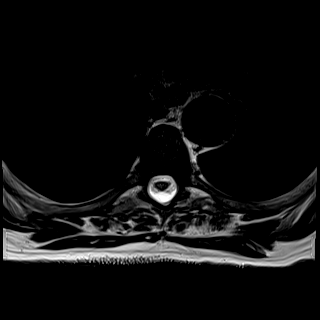
[im 30/42]
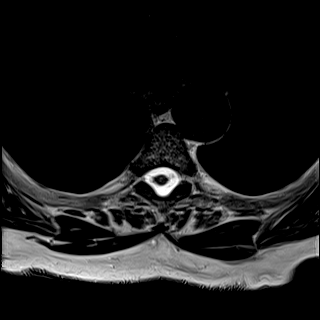
[im 36/42]
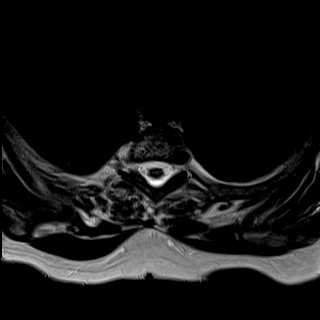
[im 42/42]
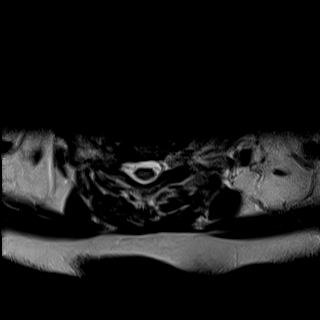

[Series 22: T1 · axial · non-contrast · 4.0mm · 0.31mm/px · z∈[-253,-39]mm · 8 of 42 slices shown (3 of 3)]
[im 1/42]
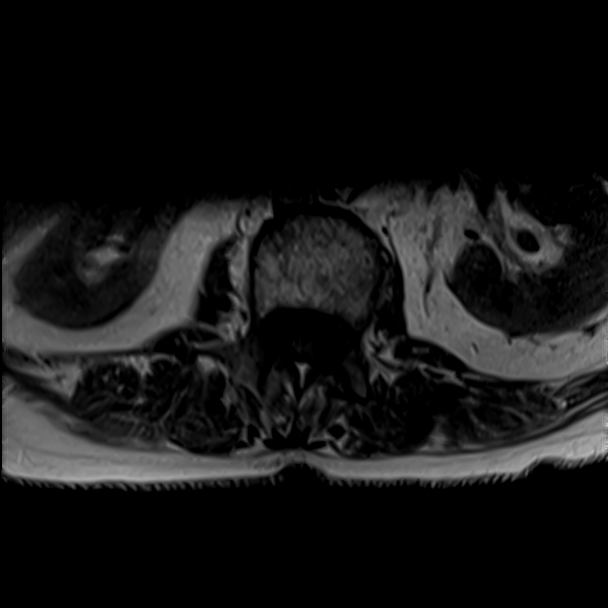
[im 6/42]
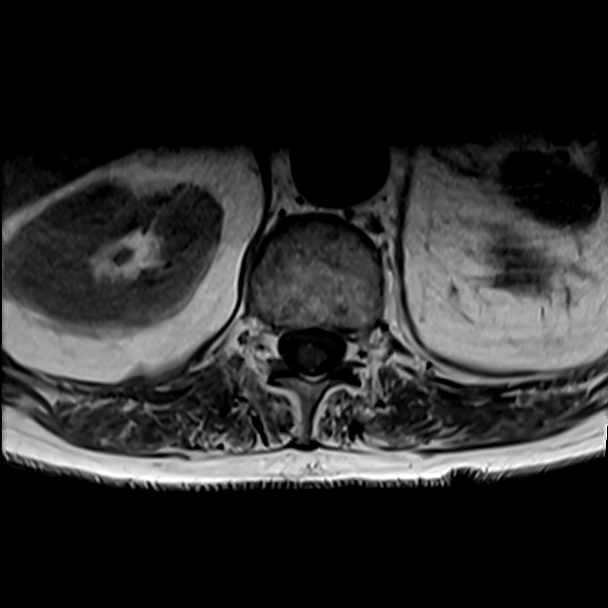
[im 12/42]
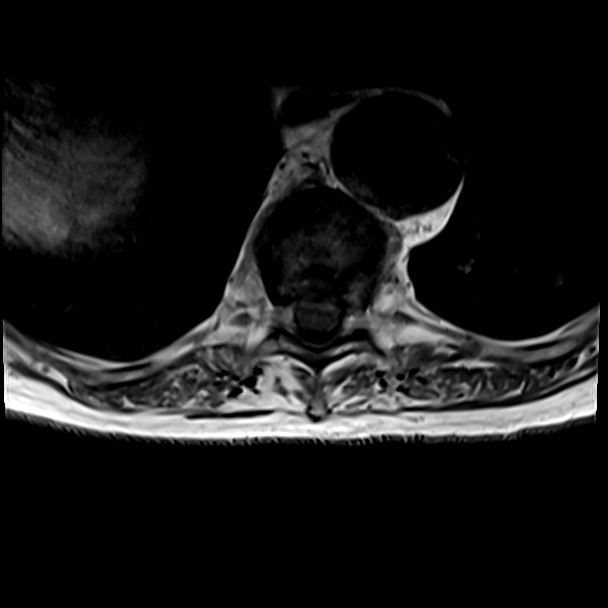
[im 18/42]
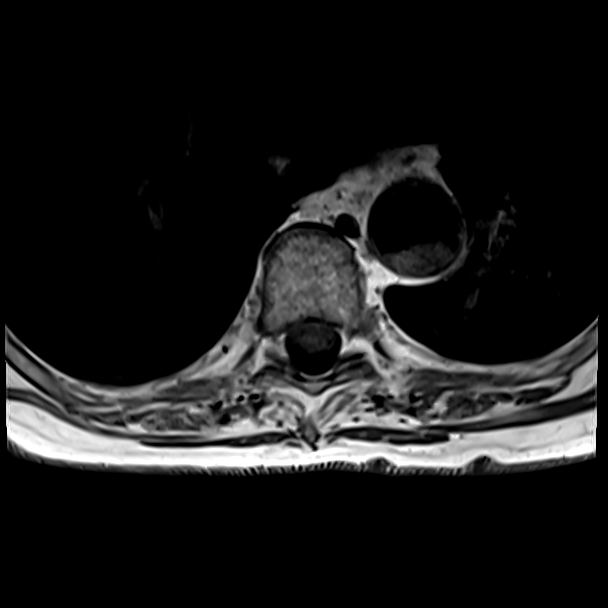
[im 24/42]
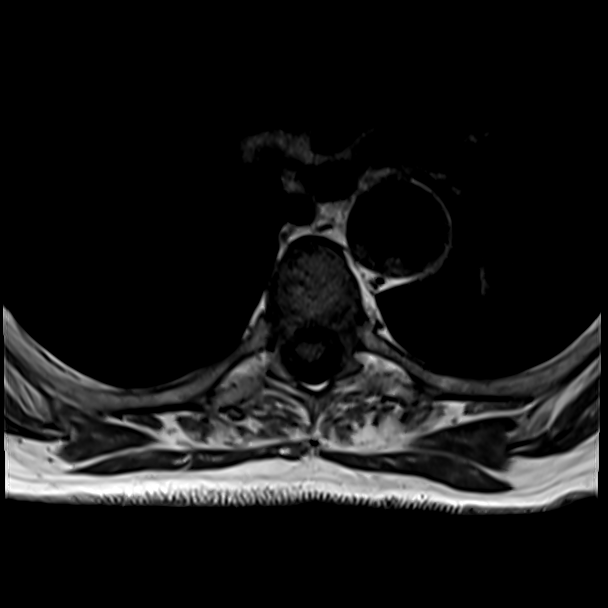
[im 30/42]
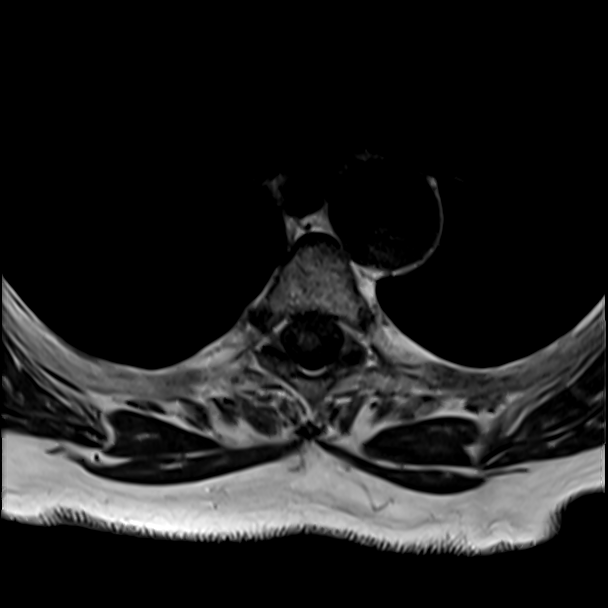
[im 36/42]
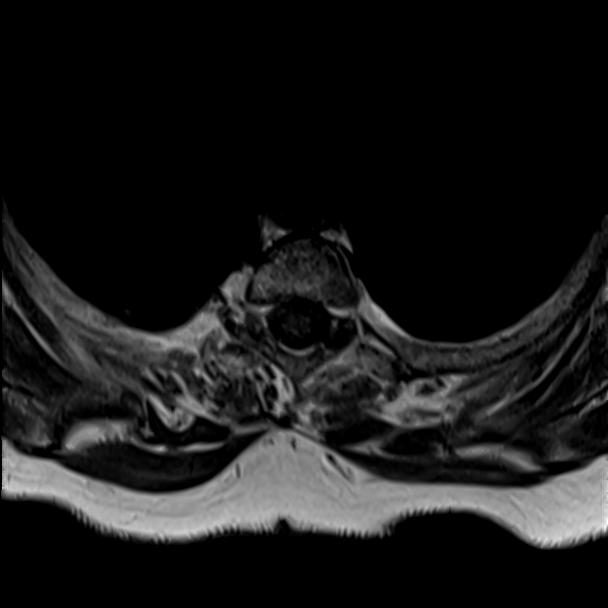
[im 42/42]
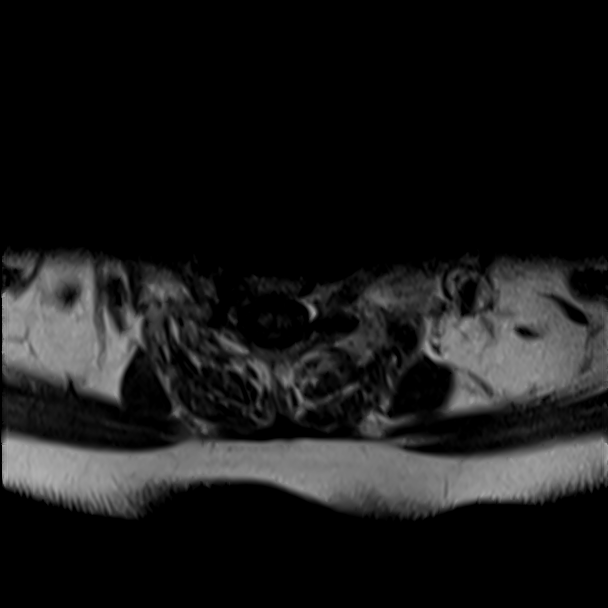

[26 of 48 positions shown; findings below may reference images not displayed]

FINDINGS: Alignment: Mildly exaggerated thoracic kyphosis. No static
listhesis.

Vertebrae: Acute T10 vertebral body compression fracture with
approximately 75% vertebral body height loss centrally. 4 mm bony
retropulsion of the inferior endplate. Marrow edema throughout the
vertebral body. There is diffusely low T1 marrow signal throughout
the entirety of the T10 vertebral body which extends to involve the
right transverse process and portion of the right pedicle (series
11, images 3-5). There is prominent postcontrast enhancement
throughout the T10 vertebral body, right pedicle, and right
transverse process (see series 24, images 2-11). No extraosseous
soft tissue mass. No evidence of epidural involvement.

Remote mild superior endplate compression fractures of T6 and T7
without residual bone marrow edema. Discogenic endplate marrow
changes at L2-3. No additional sites of abnormal marrow signal or
marrow replacement.

No evidence of discitis.

Cord: Normal signal and morphology. No cord edema or evidence of
cord contusion. No abnormal enhancement on postcontrast sequences.

Paraspinal and other soft tissues: Descending thoracic aorta
measures approximately 3.9 cm in diameter. Extensive noncalcified
plaque resulting in areas of up to approximately 50% luminal
narrowing (for example series 20, image 22). Overall, findings are
similar to the previous CT angiogram [DATE], when accounting for
differences in technique. No soft tissue masses or abnormal
enhancement. No edema or fluid collection.

Disc levels:

T10-11: Mild canal stenosis and mild bilateral foraminal stenosis
secondary to bony retropulsion of the T10 inferior endplate and mild
posterior element hypertrophy.

Remaining thoracic intervertebral disc levels are within normal
limits without focal disc protrusion, canal stenosis, or foraminal
stenosis.
IMPRESSION: 1. Acute pathologic T10 vertebral body compression fracture with
approximately 75% vertebral body height loss and 4 mm of bony
retropulsion. Resultant mild canal stenosis and mild bilateral
foraminal narrowing at this level. Findings suggestive of diffuse
marrow replacement of the T10 vertebral body with involvement of the
right transverse process and right pedicle. No extraosseous soft
tissue mass or evidence of epidural involvement.
2. No additional sites of abnormal marrow signal or marrow
replacement.
3. Remote mild superior endplate compression fractures of T6 and T7.
4. Descending thoracic aorta measures approximately 3.9 cm in
diameter with extensive noncalcified plaque resulting in up to
approximately 50% luminal narrowing. Overall, findings are similar
to the previous CT angiogram [DATE], when accounting for
differences in technique.

## 2020-07-16 MED ORDER — GADOBUTROL 1 MMOL/ML IV SOLN
7.0000 mL | Freq: Once | INTRAVENOUS | Status: AC | PRN
  Administered 2020-07-16: 7 mL via INTRAVENOUS

## 2020-07-16 NOTE — Telephone Encounter (Signed)
Yes, I spoke to her in depth yesterday regarding her results and MD recommendations.

## 2020-07-20 ENCOUNTER — Telehealth: Payer: Self-pay | Admitting: *Deleted

## 2020-07-20 NOTE — Telephone Encounter (Signed)
Yes please send her to ortho thanks.

## 2020-07-20 NOTE — Telephone Encounter (Signed)
Referral faxed to [facility] orthopedics.

## 2020-07-20 NOTE — Telephone Encounter (Signed)
Pt made aware of upcoming appts for biopsy and follow up with Dr. Tasia Catchings. Informed that will need covid testing on Friday 3/4 at [place] between 8-1030am. Pt verbalized understanding.   Pt asked for MRI results of thoracic spine. Results reviewed with pt.   Dr. Tasia Catchings - please advise if you would like pt to be referred to orthopedic surgeon or if you have other recommendations. Pt is agreeable.

## 2020-07-23 ENCOUNTER — Other Ambulatory Visit
Admission: RE | Admit: 2020-07-23 | Discharge: 2020-07-23 | Disposition: A | Discharge status: Home / Self Care | Payer: Medicare Other | Source: Ambulatory Visit | Attending: Oncology | Admitting: Oncology

## 2020-07-23 ENCOUNTER — Other Ambulatory Visit: Payer: Self-pay

## 2020-07-23 ENCOUNTER — Other Ambulatory Visit: Payer: Self-pay | Admitting: Student

## 2020-07-23 DIAGNOSIS — Z20822 Contact with and (suspected) exposure to covid-19: Secondary | ICD-10-CM | POA: Diagnosis not present

## 2020-07-23 DIAGNOSIS — Z01812 Encounter for preprocedural laboratory examination: Secondary | ICD-10-CM | POA: Diagnosis present

## 2020-07-23 LAB — SARS CORONAVIRUS 2 (TAT 6-24 HRS): SARS Coronavirus 2: NEGATIVE

## 2020-07-23 NOTE — Progress Notes (Signed)
Patient on schedule for Lung biopsy 3/7, called and spoke with patient on phone. Made aware to be here @ 0930, NPO after MN prior to procedure and have driver for discharge post procedure/recovery. Will also hold am dose of Metformin. Stated understanding.

## 2020-07-26 ENCOUNTER — Ambulatory Visit
Admission: RE | Admit: 2020-07-26 | Discharge: 2020-07-26 | Disposition: A | Discharge status: Home / Self Care | Payer: Medicare Other | Source: Ambulatory Visit | Attending: Oncology | Admitting: Oncology

## 2020-07-26 ENCOUNTER — Telehealth: Payer: Self-pay | Admitting: *Deleted

## 2020-07-26 ENCOUNTER — Other Ambulatory Visit: Payer: Self-pay

## 2020-07-26 DIAGNOSIS — Z79899 Other long term (current) drug therapy: Secondary | ICD-10-CM | POA: Insufficient documentation

## 2020-07-26 DIAGNOSIS — I10 Essential (primary) hypertension: Secondary | ICD-10-CM | POA: Insufficient documentation

## 2020-07-26 DIAGNOSIS — Z7984 Long term (current) use of oral hypoglycemic drugs: Secondary | ICD-10-CM | POA: Diagnosis not present

## 2020-07-26 DIAGNOSIS — F17210 Nicotine dependence, cigarettes, uncomplicated: Secondary | ICD-10-CM | POA: Insufficient documentation

## 2020-07-26 DIAGNOSIS — R918 Other nonspecific abnormal finding of lung field: Secondary | ICD-10-CM

## 2020-07-26 DIAGNOSIS — Z539 Procedure and treatment not carried out, unspecified reason: Secondary | ICD-10-CM | POA: Diagnosis not present

## 2020-07-26 DIAGNOSIS — Z8249 Family history of ischemic heart disease and other diseases of the circulatory system: Secondary | ICD-10-CM | POA: Insufficient documentation

## 2020-07-26 DIAGNOSIS — E119 Type 2 diabetes mellitus without complications: Secondary | ICD-10-CM | POA: Diagnosis not present

## 2020-07-26 LAB — CBC
HCT: 39 % (ref 36.0–46.0)
Hemoglobin: 12.7 g/dL (ref 12.0–15.0)
MCH: 26.4 pg (ref 26.0–34.0)
MCHC: 32.6 g/dL (ref 30.0–36.0)
MCV: 81.1 fL (ref 80.0–100.0)
Platelets: 379 10*3/uL (ref 150–400)
RBC: 4.81 MIL/uL (ref 3.87–5.11)
RDW: 20.4 % — ABNORMAL HIGH (ref 11.5–15.5)
WBC: 7.4 10*3/uL (ref 4.0–10.5)
nRBC: 0 % (ref 0.0–0.2)

## 2020-07-26 LAB — PROTIME-INR
INR: 0.9 (ref 0.8–1.2)
Prothrombin Time: 11.9 seconds (ref 11.4–15.2)

## 2020-07-26 LAB — GLUCOSE, CAPILLARY: Glucose-Capillary: 128 mg/dL — ABNORMAL HIGH (ref 70–99)

## 2020-07-26 LAB — APTT: aPTT: 26 seconds (ref 24–36)

## 2020-07-26 MED ORDER — SODIUM CHLORIDE 0.9 % IV SOLN: INTRAVENOUS | Status: DC

## 2020-07-26 NOTE — Progress Notes (Signed)
Dr. Owens Shark at bedside and had lengthy discussion with patient regarding not going ahead with procedure today.  Answered all questions.  Called sister, Shauna Hugh, to pick up patient, removed PIV, and accompanied patient to meet sister.

## 2020-07-26 NOTE — H&P (Signed)
Chief Complaint: Hypermetabolic spiculated RUL nodule. Request is for RUL  nodule biopsy  Referring Physician(s): Yu,Zhou  Supervising Physician: Sandi Mariscal  Patient Status: [facility] - Out-pt  History of Present Illness: Marissa Fletcher is a 80 y.o. female Smoker. History of DM, HTN, iron deficiency anemia. Admitted in February 2022 for chest and lower back pain. Found to have RUL nodule. PET scan from 2.22.22 shows a hypermetabolic spiculated RUL nodule. Team is requesting a RUL lung biopsy at this time.   Currently reporting back pain. Patient states that she fractured her back in December. Patient alert and laying in bed, calm and comfortable. Denies any fevers, headache, chest pain, SOB, cough, abdominal pain, nausea, vomiting or bleeding. Patient is sating 92% on [place]. She denies use of O2 or CPAP at home.   Past Medical History:  Diagnosis Date  . Diabetes mellitus without complication ([place])   . Hypertension     Past Surgical History:  Procedure Laterality Date  . ABDOMINAL HYSTERECTOMY    . CHOLECYSTECTOMY      Allergies: Sulfa antibiotics  Medications: Prior to Admission medications   Medication Sig Start Date End Date Taking? Authorizing Provider  ALPRAZolam Duanne Moron) 0.25 MG tablet Take 0.25 mg by mouth daily as needed for anxiety. Patient not taking: Reported on 07/01/2020 04/19/20   [provider]  atorvastatin (LIPITOR) 20 MG tablet Take 20 mg by mouth daily. 04/19/20 04/19/21  [provider]  diclofenac Sodium (VOLTAREN) 1 % GEL Apply topically. 05/18/20 05/18/21  [provider]  doxepin (SINEQUAN) 25 MG capsule Take 50-75 mg by mouth at bedtime. 04/01/20   [provider]  gabapentin (NEURONTIN) 300 MG capsule Take 300 mg by mouth daily. 02/18/20   [provider]  HYDROcodone-acetaminophen (NORCO/VICODIN) 5-325 MG tablet Take 1 tablet by mouth every 6 (six) hours as needed for severe pain. 05/12/20   Loletha Grayer, MD  lidocaine (LIDODERM) 5 % Place 1 patch onto the skin daily. Remove & Discard patch within 12 hours or as directed by MD 05/12/20   Loletha Grayer, MD  lisinopril-hydrochlorothiazide (ZESTORETIC) 20-12.5 MG tablet Take 1 tablet by mouth daily. 02/27/20   [provider]  metFORMIN (GLUCOPHAGE-XR) 500 MG 24 hr tablet Take 500 mg by mouth 2 (two) times daily. 03/08/20   [provider]  nicotine (NICODERM CQ - DOSED IN MG/24 HOURS) 21 mg/24hr patch One 21 mg patch chest wall daily (okay to substitute generic) 05/12/20   Loletha Grayer, MD  PARoxetine (PAXIL) 30 MG tablet Take 30 mg by mouth daily. 03/08/20   [provider]  polyethylene glycol (MIRALAX / GLYCOLAX) 17 g packet Take 17 g by mouth daily as needed for moderate constipation. 05/12/20   Loletha Grayer, MD     Family History  Problem Relation Age of Onset  . Dementia Mother   . Congestive Heart Failure Mother   . Bladder Cancer Father   . Heart disease Father   . Breast cancer Neg Hx     Social History   Socioeconomic History  . Marital status: Single    Spouse name: Not on file  . Number of children: Not on file  . Years of education: Not on file  . Highest education level: Not on file  Occupational History  . Not on file  Tobacco Use  . Smoking status: Current Every Day Smoker    Packs/day: 0.50    Years: 68.00    Pack years: 34.00  . Smokeless tobacco: Never  Used  Substance and Sexual Activity  . Alcohol use: Never  . Drug use: Never  . Sexual activity: Not on file  Other Topics Concern  . Not on file  Social History Narrative  . Not on file   Social Determinants of Health   Financial Resource Strain: Not on file  Food Insecurity: Not on file  Transportation Needs: Not on file  Physical Activity: Not on file  Stress: Not on file  Social Connections: Not on file    Review of Systems: A 12 point ROS discussed and pertinent positives are indicated in the HPI  above.  All other systems are negative.  Review of Systems  Constitutional: Negative for fatigue and fever.  HENT: Negative for congestion.   Respiratory: Negative for cough and shortness of breath.   Gastrointestinal: Negative for abdominal pain, diarrhea, nausea and vomiting.  Musculoskeletal: Positive for back pain.    Vital Signs: There were no vitals taken for this visit.  Physical Exam Vitals and nursing note reviewed.  Constitutional:      Appearance: She is well-developed.  HENT:     Head: Normocephalic and atraumatic.  Eyes:     Conjunctiva/sclera: Conjunctivae normal.  Cardiovascular:     Rate and Rhythm: Normal rate and regular rhythm.     Heart sounds: Normal heart sounds.  Pulmonary:     Effort: Pulmonary effort is normal.     Breath sounds: Wheezing present.     Comments: On [place] 2L sating 92%. Denies O2 or CPAP use at home Musculoskeletal:        General: Normal range of motion.     Cervical back: Normal range of motion.  Skin:    General: Skin is warm.  Neurological:     Mental Status: She is alert and oriented to person, place, and time.     Imaging: MR Thoracic Spine W Wo Contrast  Result Date: 07/16/2020 CLINICAL DATA:  Evaluate T10 compression fracture identified on recent PET-CT. EXAM: MRI THORACIC WITHOUT AND WITH CONTRAST TECHNIQUE: Multiplanar and multiecho pulse sequences of the thoracic spine were obtained without and with intravenous contrast. CONTRAST:  18mL GADAVIST GADOBUTROL 1 MMOL/ML IV SOLN COMPARISON:  PET-CT 07/13/2020.  CT chest 07/12/2019 FINDINGS: Alignment: Mildly exaggerated thoracic kyphosis. No static listhesis. Vertebrae: Acute T10 vertebral body compression fracture with approximately 75% vertebral body height loss centrally. 4 mm bony retropulsion of the inferior endplate. Marrow edema throughout the vertebral body. There is diffusely low T1 marrow signal throughout the entirety of the T10 vertebral body which extends to involve the  right transverse process and portion of the right pedicle (series 11, images 3-5). There is prominent postcontrast enhancement throughout the T10 vertebral body, right pedicle, and right transverse process (see series 24, images 2-11). No extraosseous soft tissue mass. No evidence of epidural involvement. Remote mild superior endplate compression fractures of T6 and T7 without residual bone marrow edema. Discogenic endplate marrow changes at L2-3. No additional sites of abnormal marrow signal or marrow replacement. No evidence of discitis. Cord: Normal signal and morphology. No cord edema or evidence of cord contusion. No abnormal enhancement on postcontrast sequences. Paraspinal and other soft tissues: Descending thoracic aorta measures approximately 3.9 cm in diameter. Extensive noncalcified plaque resulting in areas of up to approximately 50% luminal narrowing (for example series 20, image 22). Overall, findings are similar to the previous CT angiogram 05/10/2020, when accounting for differences in technique. No soft tissue masses or abnormal enhancement. No edema or fluid collection. Disc  levels: T10-11: Mild canal stenosis and mild bilateral foraminal stenosis secondary to bony retropulsion of the T10 inferior endplate and mild posterior element hypertrophy. Remaining thoracic intervertebral disc levels are within normal limits without focal disc protrusion, canal stenosis, or foraminal stenosis. IMPRESSION: 1. Acute pathologic T10 vertebral body compression fracture with approximately 75% vertebral body height loss and 4 mm of bony retropulsion. Resultant mild canal stenosis and mild bilateral foraminal narrowing at this level. Findings suggestive of diffuse marrow replacement of the T10 vertebral body with involvement of the right transverse process and right pedicle. No extraosseous soft tissue mass or evidence of epidural involvement. 2. No additional sites of abnormal marrow signal or marrow replacement.  3. Remote mild superior endplate compression fractures of T6 and T7. 4. Descending thoracic aorta measures approximately 3.9 cm in diameter with extensive noncalcified plaque resulting in up to approximately 50% luminal narrowing. Overall, findings are similar to the previous CT angiogram 05/10/2020, when accounting for differences in technique. Electronically Signed   By: Davina Poke D.O.   On: 07/16/2020 11:09   NM PET Image Initial (PI) Skull Base To Thigh  Result Date: 07/14/2020 CLINICAL DATA:  Initial treatment strategy for lung lesion discovered on recent CT of the chest. EXAM: NUCLEAR MEDICINE PET SKULL BASE TO THIGH TECHNIQUE: 8.0 mCi F-18 FDG was injected intravenously. Full-ring PET imaging was performed from the skull base to thigh after the radiotracer. CT data was obtained and used for attenuation correction and anatomic localization. Fasting blood glucose: 139 mg/dl COMPARISON:  None. FINDINGS: Mediastinal blood pool activity: SUV max 2.11 Liver activity: SUV max not applicable NECK: No hypermetabolic lymph nodes in the neck. Incidental CT findings: None CHEST: RIGHT upper lobe nodule, the area of concern with spiculated features measuring approximately 1.9 x 1.8 cm with a maximum SUV of 9.4. No hypermetabolic lymph nodes. Background pulmonary emphysema. Stable nodule measuring 3 mm peripheral to this on image 90 of series 3 without associated FDG uptake. Incidental CT findings: Calcified atheromatous plaque of the thoracic aorta without aneurysm. Normal caliber central pulmonary vessels. Limited assessment of cardiovascular structures given lack of intravenous contrast. Heart size stable. No pericardial effusion. Three-vessel coronary artery disease. Normal appearance of the esophagus. Pulmonary emphysema as described. 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 ABDOMEN/PELVIS: No abnormal hypermetabolic activity within the liver, pancreas, adrenal glands, or spleen. No  hypermetabolic lymph nodes in the abdomen or pelvis. Adrenal glands are normal. Incidental CT findings: Post cholecystectomy. Liver, spleen, pancreas and adrenal glands without acute process. LEFT adrenal adenoma measuring approximately 1 cm. Mild cortical scarring of the kidneys. No hydronephrosis. Mild distension of fluid-filled bowel loops in the abdomen with similar appearance to previous imaging. Generalized uptake throughout the colon. Fat deposition in the submucosal aspect of the colonic wall. Colonic diverticulosis. Post hysterectomy. Extensive abdominal aortic atherosclerosis with 3.3 x 3.1 cm dilation of the infrarenal abdominal aorta. SKELETON: No focal hypermetabolic activity to suggest skeletal metastasis. Incidental CT findings: Interval greater than 50% loss of height at the T10 level as determined by the lowest rib-bearing vertebral body. Mild increased metabolic activity in this location, nonspecific in the setting of interval fracture. Upper thoracic and midthoracic vertebral wedging at T7 and T6 with similar appearance. Spinal degenerative changes. IMPRESSION: Marked hypermetabolic activity associated with RIGHT upper lobe nodule compatible with bronchogenic neoplasm given morphologic features and metabolic activity. No adenopathy or distant metastatic disease. Small nonspecific pulmonary nodules without change or increased metabolic activity. Interval greater than 50% loss  of height, compression fracture, of the T10 vertebral body since December of 2021, dedicated imaging may be helpful particularly there is ongoing pain. Diffuse colonic activity is nonspecific and may be physiologic. Would however correlate with any signs of colitis. Calcified coronary artery disease. 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 For abdominal follow-up. Recommend follow-up ultrasound every 3 years. This recommendation follows ACR consensus guidelines: White Paper of the ACR Incidental Findings  Committee II on Vascular Findings. J Am Coll Radiol 2013; 10:789-794. could consider annual follow-up for thoracic aortic dilation. Aortic Atherosclerosis (ICD10-I70.0) and Emphysema (ICD10-J43.9). Aortic aneurysm NOS (ICD10-I71.9). These results will be called to the ordering clinician or representative by the Radiologist Assistant, and communication documented in the PACS or [employer]. Electronically Signed   By: Zetta Bills M.D.   On: 07/14/2020 15:29    Labs:  CBC: Recent Labs    05/10/20 1537 05/10/20 1921 05/11/20 0422 05/12/20 0444  WBC 8.7  --  9.1 7.6  HGB 7.9* 7.8* 8.0* 8.7*  HCT 26.4* 26.2* 26.2* 28.4*  PLT 341  --  349 391    COAGS: Recent Labs    05/10/20 1921  INR 1.0  APTT 29    BMP: Recent Labs    05/10/20 1537 05/11/20 0422 05/12/20 0444  NA 134* 137 135  K 3.9 3.4* 3.7  CL 98 101 100  CO2 25 26 25   GLUCOSE 117* 111* 116*  BUN 12 10 7*  CALCIUM 8.7* 8.6* 8.7*  CREATININE 0.83 0.75 0.61  GFRNONAA >60 >60 >60    LIVER FUNCTION TESTS: Recent Labs    05/10/20 1537  BILITOT 0.5  AST 25  ALT 16  ALKPHOS 125  PROT 6.8  ALBUMIN 3.4*    Assessment and Plan:  80 y.o. female outpatient. Smoker. History of DM, HTN, iron deficiency anemia. Admitted in February 2022 for chest and lower back pain. Found to have RUL nodule. PET scan from 2.22.22 shows a hypermetabolic spiculated  RUL nodule. Team is requesting a RUL lung biopsy at this time.   COVID- on 3.4. No recent labs. All medications are within acceptable parameters. Allergies include Sulfa. Patient has been NPO since midnight.  Risks and benefits of RUL nodule biopsy was discussed with the patient and/or patient's family including, but not limited to bleeding, infection, damage to adjacent structures or low yield requiring additional tests.  All of the questions were answered and there is agreement to proceed.  Consent signed and in chart.    Thank you for this interesting  consult.  I greatly enjoyed meeting LYNEE ROSENBACH and look forward to participating in their care.  A copy of this report was sent to the requesting provider on this date.  Electronically Signed: Jacqualine Mau, NP 07/26/2020, 10:03 AM   I spent a total of  30 Minutes   in face to face in clinical consultation, greater than 50% of which was counseling/coordinating care for RUL nodule biopsy

## 2020-07-26 NOTE — Telephone Encounter (Signed)
Received call from pt that biopsy from today was cancelled due to concerning T10 compression fracture on imaging. Radiologist recommends for pt to proceed with kyphoplasty to rule out mets. Pt can be rescheduled for lung biopsy if T10 compression fracture negative for malignancy.   Spoke with patient and provided reassurance. Informed to keep her appt with Dr. Rudene Christians as scheduled and I will follow up with her after her procedure to discuss next steps. Pt verbalized understanding.

## 2020-07-28 ENCOUNTER — Other Ambulatory Visit: Payer: Self-pay | Admitting: Orthopedic Surgery

## 2020-07-28 ENCOUNTER — Other Ambulatory Visit: Payer: Self-pay

## 2020-07-28 ENCOUNTER — Other Ambulatory Visit
Admission: RE | Admit: 2020-07-28 | Discharge: 2020-07-28 | Disposition: A | Discharge status: Home / Self Care | Payer: Medicare Other | Source: Ambulatory Visit | Attending: Orthopedic Surgery | Admitting: Orthopedic Surgery

## 2020-07-28 DIAGNOSIS — Z01812 Encounter for preprocedural laboratory examination: Secondary | ICD-10-CM | POA: Insufficient documentation

## 2020-07-28 DIAGNOSIS — Z20822 Contact with and (suspected) exposure to covid-19: Secondary | ICD-10-CM | POA: Diagnosis not present

## 2020-07-28 LAB — SARS CORONAVIRUS 2 (TAT 6-24 HRS): SARS Coronavirus 2: NEGATIVE

## 2020-07-29 ENCOUNTER — Other Ambulatory Visit
Admission: RE | Admit: 2020-07-29 | Discharge: 2020-07-29 | Disposition: A | Discharge status: Home / Self Care | Payer: Medicare Other | Source: Ambulatory Visit | Attending: Orthopedic Surgery | Admitting: Orthopedic Surgery

## 2020-07-29 HISTORY — DX: Unspecified osteoarthritis, unspecified site: M19.90

## 2020-07-29 HISTORY — DX: Anxiety disorder, unspecified: F41.9

## 2020-07-29 HISTORY — DX: Personal history of urinary calculi: Z87.442

## 2020-07-29 HISTORY — DX: Depression, unspecified: F32.A

## 2020-07-29 NOTE — Patient Instructions (Addendum)
Your procedure is scheduled on: Friday July 30, 2020. Report to Day Surgery inside [facility] 2nd floor (stop by Admissions desk first before getting on Elevator). To find out your arrival time please call [phone] between 1PM - 3PM on Thursday July 29, 2020.  Remember: Instructions that are not followed completely may result in serious medical risk,  up to and including death, or upon the discretion of your surgeon and anesthesiologist your  surgery may need to be rescheduled.     _X__ 1. Do not eat food after midnight the night before your procedure.                 No chewing gum or hard candies. You may drink clear liquids up to 2 hours                 before you are scheduled to arrive for your surgery- DO not drink clear                 liquids within 2 hours of the start of your surgery.                 Clear Liquids include:  water, apple juice without pulp, clear Gatorade, G2 or                  Gatorade Zero (avoid Red/Purple/Blue), Black Coffee or Tea (Do not add                 anything to coffee or tea).  _____2.   Complete the "Ensure Clear Pre-surgery Clear Carbohydrate Drink" provided to you, 2 hours before arrival. **If you are diabetic you will be provided with an alternative drink, Gatorade Zero or G2.  __X__3.  On the morning of surgery brush your teeth with toothpaste and water, you                may rinse your mouth with mouthwash if you wish.  Do not swallow any toothpaste of mouthwash.     _X__ 4.  No Alcohol for 24 hours before or after surgery.   _X__ 5.  Do Not Smoke or use e-cigarettes For 24 Hours Prior to Your Surgery.                 Do not use any chewable tobacco products for at least 6 hours prior to                 Surgery.  _X__  6.  Do not use any recreational drugs (marijuana, cocaine, heroin, ecstasy, MDMA or other)                For at least one week prior to your surgery.  Combination of these drugs with  anesthesia                May have life threatening results.  __X__  7.  Notify your doctor if there is any change in your medical condition      (cold, fever, infections).     Do not wear jewelry, make-up, hairpins, clips or nail polish. Do not wear lotions, powders, or perfumes.You may wear deodorant. Do not shave 48 hours prior to surgery. Men may shave face and neck. Do not bring valuables to the hospital.    [facility] is not responsible for any belongings or valuables.  Contacts, dentures or bridgework may not be worn into surgery. Leave your suitcase in the car. After surgery  it may be brought to your room. For patients admitted to the hospital, discharge time is determined by your treatment team.   Patients discharged the day of surgery will not be allowed to drive home.   Make arrangements for someone to be with you for the first 24 hours of your Same Day Discharge.   __X__ Take these medicines the morning of surgery with A SIP OF WATER:      1. PARoxetine (PAXIL) 30 MG   2. HYDROcodone-acetaminophen (NORCO/VICODIN) 5-325 MG (if needed)    ____ Fleet Enema (as directed)   __X__ Use CHG Soap (or wipes) as directed  ____ Use Benzoyl Peroxide Gel as instructed  ____ Use inhalers on the day of surgery  __X__ Stop metFORMIN (GLUCOPHAGE-XR) 500 MG 24  2 days prior to surgery    ____ Take 1/2 of usual insulin dose the night before surgery. No insulin the morning          of surgery.   __X__ Stop Anti-inflammatories such as Ibuprofen, Aleve, Advil, naproxen, aspirin and or BC powders.   __X__ Stop supplements until after surgery.    __X__ Do not start any herbal supplements before your procedure.    If you have any questions regarding your pre-procedure instructions,  Please call Pre-admit Testing at [phone].

## 2020-07-30 ENCOUNTER — Encounter: Payer: Self-pay | Admitting: Orthopedic Surgery

## 2020-07-30 ENCOUNTER — Ambulatory Visit: Payer: Medicare Other | Admitting: Anesthesiology

## 2020-07-30 ENCOUNTER — Ambulatory Visit
Admission: RE | Admit: 2020-07-30 | Discharge: 2020-07-30 | Disposition: A | Discharge status: Home / Self Care | Payer: Medicare Other | Attending: Orthopedic Surgery | Admitting: Orthopedic Surgery

## 2020-07-30 ENCOUNTER — Ambulatory Visit: Payer: Medicare Other

## 2020-07-30 ENCOUNTER — Encounter
Admission: RE | Disposition: A | Discharge status: Home / Self Care | Payer: Self-pay | Source: Home / Self Care | Attending: Orthopedic Surgery

## 2020-07-30 ENCOUNTER — Other Ambulatory Visit: Payer: Self-pay

## 2020-07-30 DIAGNOSIS — Z882 Allergy status to sulfonamides status: Secondary | ICD-10-CM | POA: Insufficient documentation

## 2020-07-30 DIAGNOSIS — Z419 Encounter for procedure for purposes other than remedying health state, unspecified: Secondary | ICD-10-CM

## 2020-07-30 DIAGNOSIS — M4854XA Collapsed vertebra, not elsewhere classified, thoracic region, initial encounter for fracture: Secondary | ICD-10-CM | POA: Diagnosis present

## 2020-07-30 DIAGNOSIS — M8088XA Other osteoporosis with current pathological fracture, vertebra(e), initial encounter for fracture: Secondary | ICD-10-CM | POA: Insufficient documentation

## 2020-07-30 DIAGNOSIS — Z7982 Long term (current) use of aspirin: Secondary | ICD-10-CM | POA: Insufficient documentation

## 2020-07-30 DIAGNOSIS — Z79899 Other long term (current) drug therapy: Secondary | ICD-10-CM | POA: Insufficient documentation

## 2020-07-30 DIAGNOSIS — Z7984 Long term (current) use of oral hypoglycemic drugs: Secondary | ICD-10-CM | POA: Insufficient documentation

## 2020-07-30 DIAGNOSIS — C3490 Malignant neoplasm of unspecified part of unspecified bronchus or lung: Secondary | ICD-10-CM | POA: Diagnosis not present

## 2020-07-30 HISTORY — PX: KYPHOPLASTY: SHX5884

## 2020-07-30 LAB — GLUCOSE, CAPILLARY
Glucose-Capillary: 149 mg/dL — ABNORMAL HIGH (ref 70–99)
Glucose-Capillary: 161 mg/dL — ABNORMAL HIGH (ref 70–99)

## 2020-07-30 IMAGING — XA DG C-ARM 1-60 MIN
2 series · 2 of 2 positions shown · non-contrast
Comparison: MRI [DATE].

CLINICAL DATA: Kyphoplasty

EXAM:
DG C-ARM 1-60 MIN; THORACIC SPINE 2 VIEWS
FLUOROSCOPY TIME:  Fluoroscopy Time:  30.7 seconds

[Series 1: dg x-ray · 0.20mm/px · 1 of 1 slices shown]
[im 1/1]
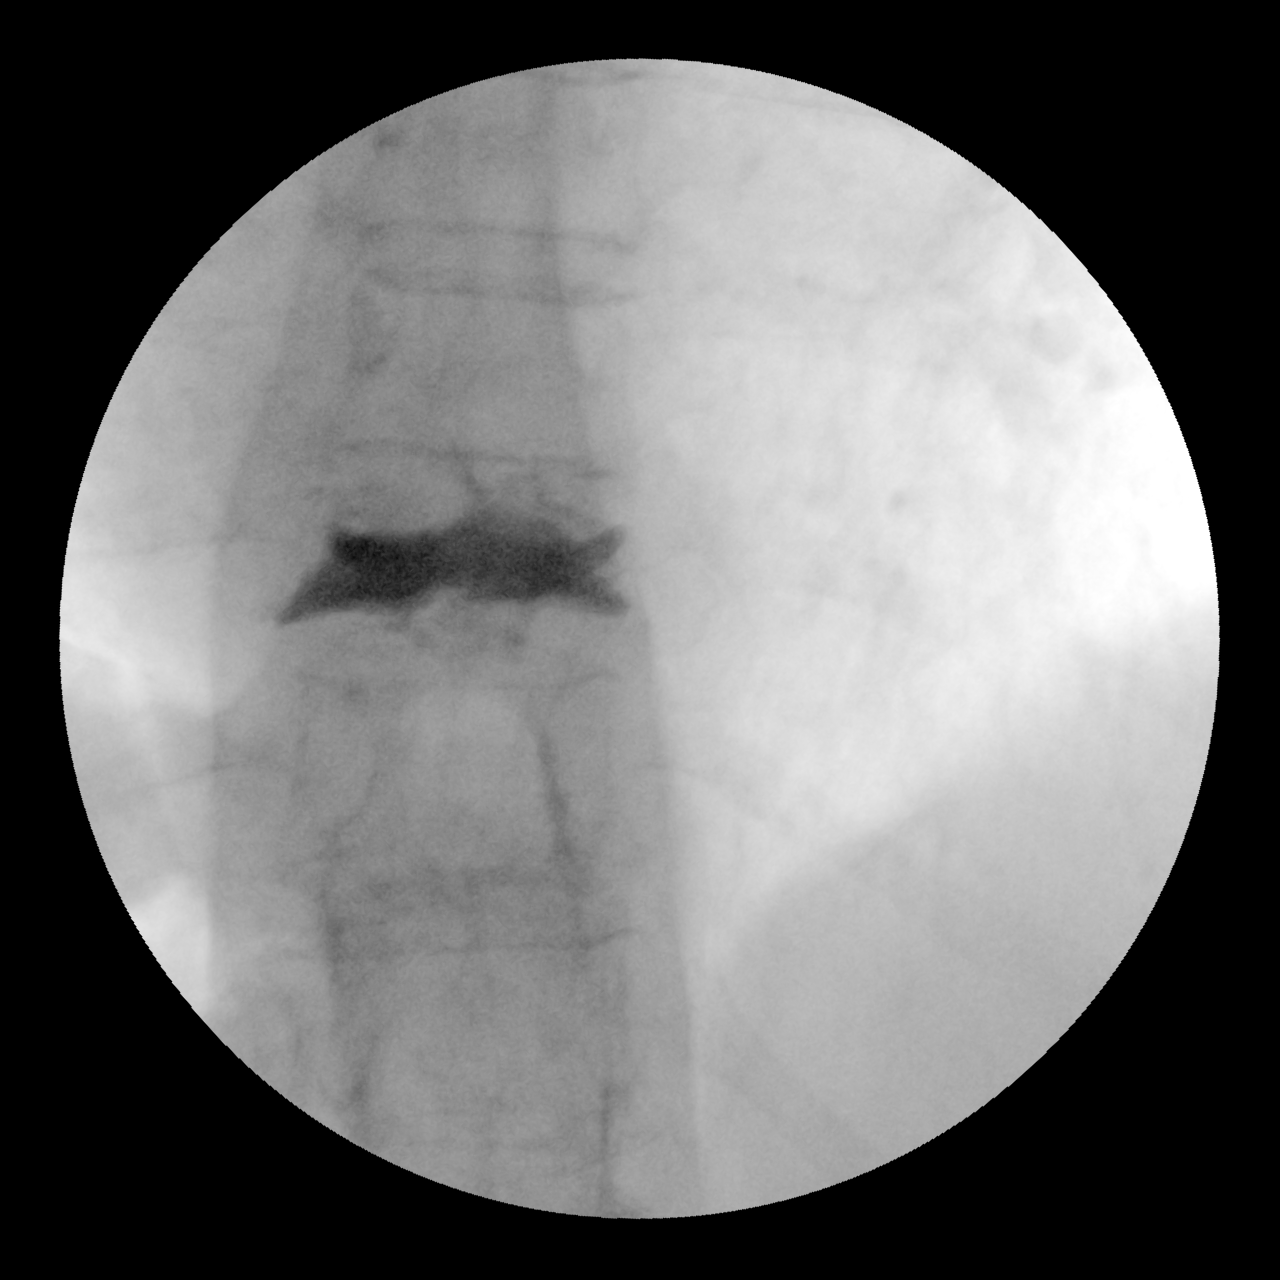

[Series 3: ortho standard · 1 of 1 slices shown]
[im 1/1]
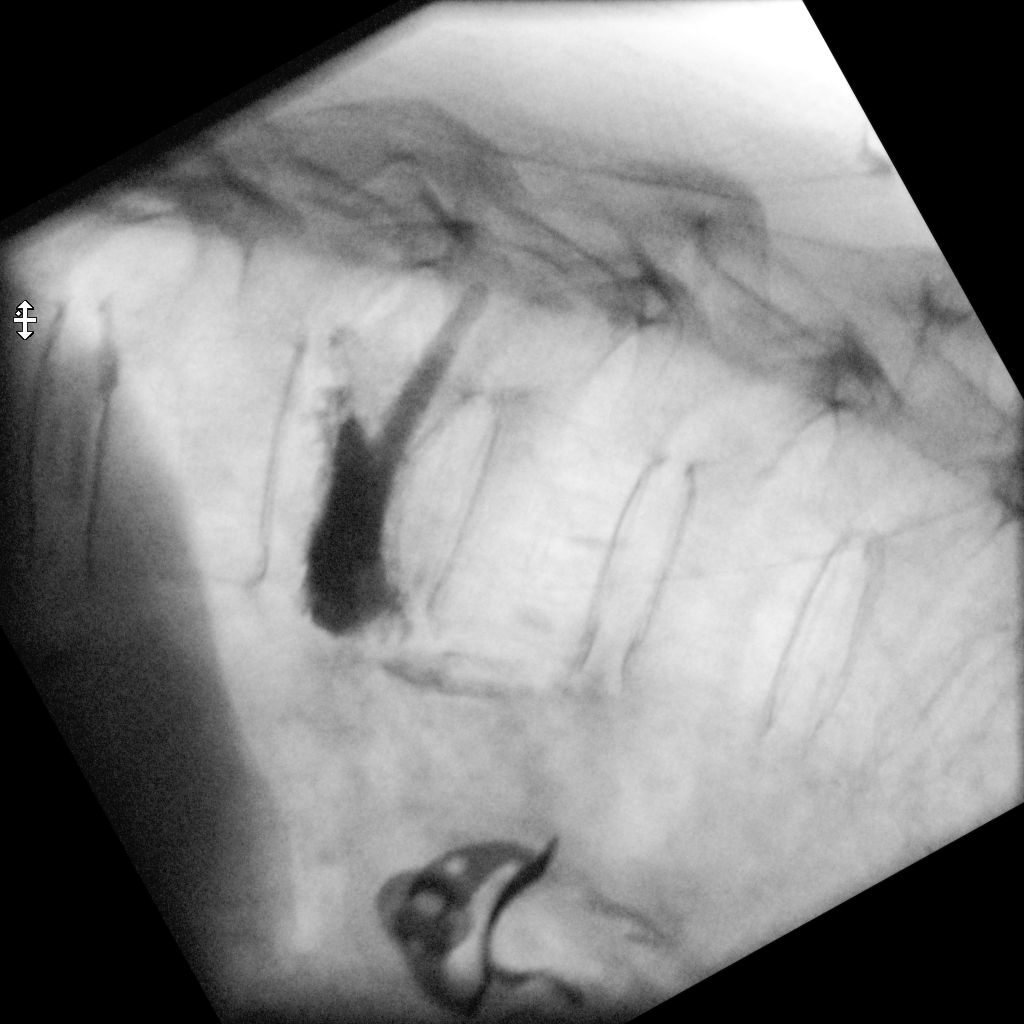

[2 of 2 positions shown; findings below may reference images not displayed]

FINDINGS: Two C-arm fluoroscopic images were obtained intraoperatively and
submitted for post operative interpretation. These images
demonstrate kyphoplasty at the T10 level without definite
extravasation of cement. please see the performing provider's
procedural report for further detail.
IMPRESSION: T10 kyphoplasty.

## 2020-07-30 IMAGING — XA DG THORACIC SPINE 2V
2 series · 2 of 2 positions shown · non-contrast
Comparison: MRI [DATE].

CLINICAL DATA: Kyphoplasty

EXAM:
DG C-ARM 1-60 MIN; THORACIC SPINE 2 VIEWS
FLUOROSCOPY TIME:  Fluoroscopy Time:  30.7 seconds

[Series 1: dg x-ray · 0.20mm/px · 1 of 1 slices shown]
[im 1/1]
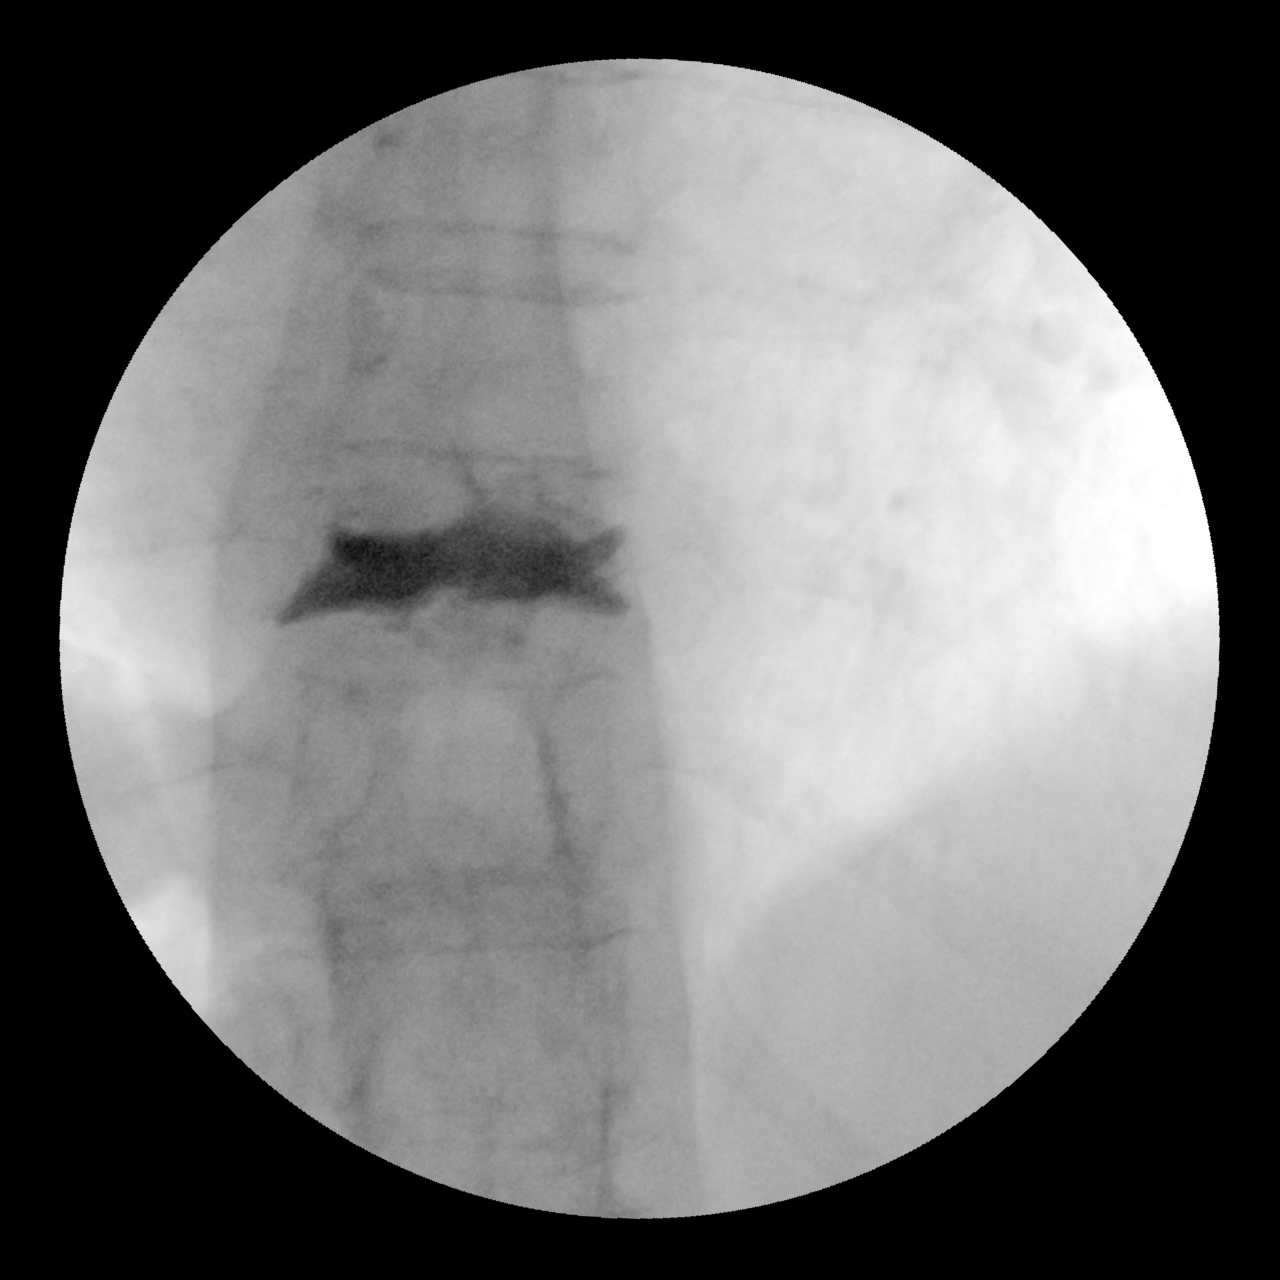

[Series 3: ortho standard · 1 of 1 slices shown]
[im 1/1]
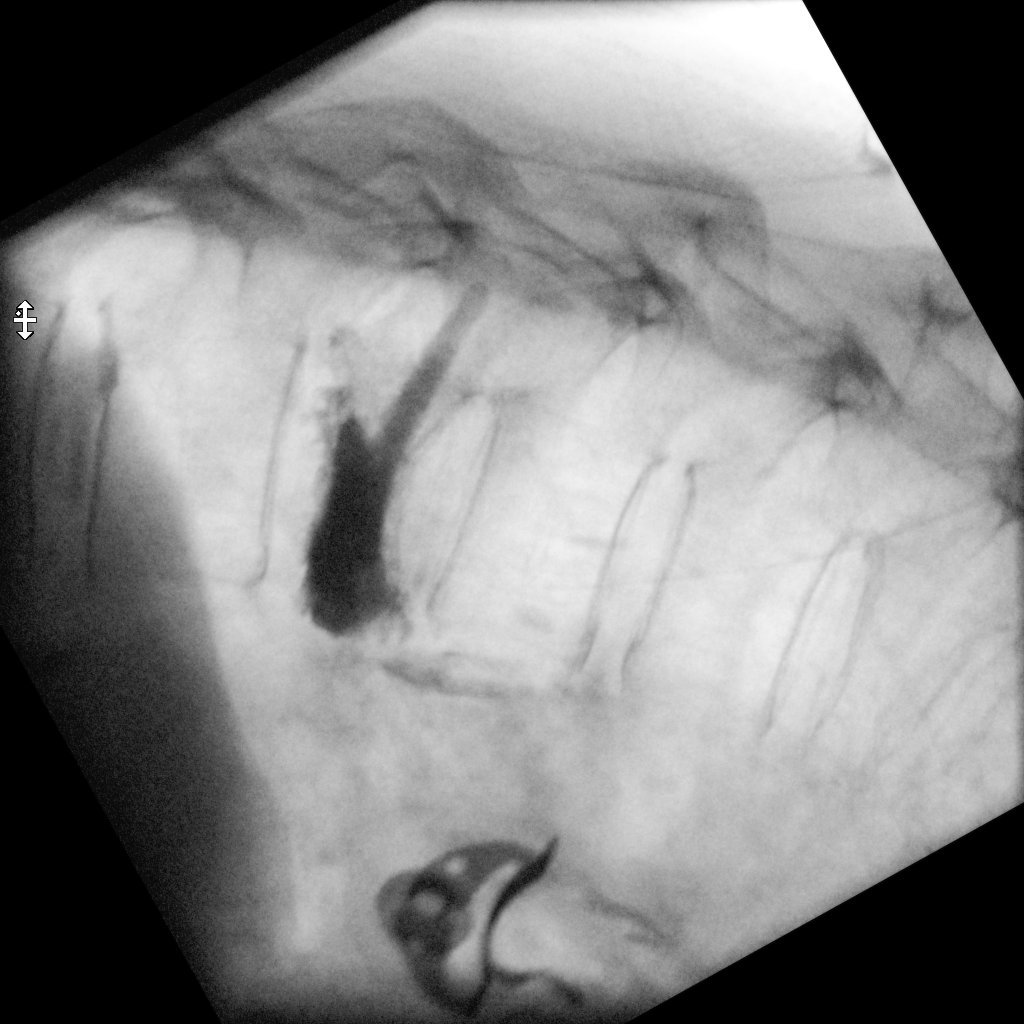

[2 of 2 positions shown; findings below may reference images not displayed]

FINDINGS: Two C-arm fluoroscopic images were obtained intraoperatively and
submitted for post operative interpretation. These images
demonstrate kyphoplasty at the T10 level without definite
extravasation of cement. please see the performing provider's
procedural report for further detail.
IMPRESSION: T10 kyphoplasty.

## 2020-07-30 SURGERY — KYPHOPLASTY
Anesthesia: General | Site: Thoracic

## 2020-07-30 MED ORDER — PROPOFOL 500 MG/50ML IV EMUL
INTRAVENOUS | Status: DC | PRN
  Administered 2020-07-30: 50 ug/kg/min via INTRAVENOUS

## 2020-07-30 MED ORDER — DEXMEDETOMIDINE (PRECEDEX) IN NS 20 MCG/5ML (4 MCG/ML) IV SYRINGE
PREFILLED_SYRINGE | INTRAVENOUS | Status: DC | PRN
  Administered 2020-07-30 (×5): 4 ug via INTRAVENOUS

## 2020-07-30 MED ORDER — ORAL CARE MOUTH RINSE: 15.0000 mL | Freq: Once | OROMUCOSAL | Status: DC

## 2020-07-30 MED ORDER — CHLORHEXIDINE GLUCONATE 0.12 % MT SOLN
OROMUCOSAL | Status: AC
  Filled 2020-07-30: qty 15

## 2020-07-30 MED ORDER — FAMOTIDINE 20 MG PO TABS
ORAL_TABLET | ORAL | Status: AC
  Administered 2020-07-30: 20 mg via ORAL
  Filled 2020-07-30: qty 1

## 2020-07-30 MED ORDER — KETAMINE HCL 10 MG/ML IJ SOLN
INTRAMUSCULAR | Status: DC | PRN
  Administered 2020-07-30 (×4): 10 mg via INTRAVENOUS

## 2020-07-30 MED ORDER — LIDOCAINE HCL (PF) 1 % IJ SOLN
INTRAMUSCULAR | Status: AC
  Filled 2020-07-30: qty 60

## 2020-07-30 MED ORDER — CEFAZOLIN SODIUM-DEXTROSE 2-4 GM/100ML-% IV SOLN
INTRAVENOUS | Status: AC
  Filled 2020-07-30: qty 100

## 2020-07-30 MED ORDER — ONDANSETRON HCL 4 MG/2ML IJ SOLN: 4.0000 mg | Freq: Once | INTRAMUSCULAR | Status: DC | PRN

## 2020-07-30 MED ORDER — FAMOTIDINE 20 MG PO TABS: 20.0000 mg | ORAL_TABLET | Freq: Once | ORAL | Status: AC

## 2020-07-30 MED ORDER — CEFAZOLIN SODIUM-DEXTROSE 2-4 GM/100ML-% IV SOLN
2.0000 g | INTRAVENOUS | Status: AC
  Administered 2020-07-30: 2 g via INTRAVENOUS

## 2020-07-30 MED ORDER — PROPOFOL 10 MG/ML IV BOLUS
INTRAVENOUS | Status: AC
  Filled 2020-07-30: qty 20

## 2020-07-30 MED ORDER — BUPIVACAINE-EPINEPHRINE (PF) 0.5% -1:200000 IJ SOLN
INTRAMUSCULAR | Status: AC
  Filled 2020-07-30: qty 30

## 2020-07-30 MED ORDER — ACETAMINOPHEN 10 MG/ML IV SOLN
INTRAVENOUS | Status: DC | PRN
  Administered 2020-07-30: 1000 mg via INTRAVENOUS

## 2020-07-30 MED ORDER — FENTANYL CITRATE (PF) 100 MCG/2ML IJ SOLN
INTRAMUSCULAR | Status: DC | PRN
  Administered 2020-07-30 (×4): 25 ug via INTRAVENOUS

## 2020-07-30 MED ORDER — HYDROCODONE-ACETAMINOPHEN 5-325 MG PO TABS: 1.0000 | ORAL_TABLET | ORAL | 0 refills | Status: DC | PRN

## 2020-07-30 MED ORDER — DEXMEDETOMIDINE (PRECEDEX) IN NS 20 MCG/5ML (4 MCG/ML) IV SYRINGE
PREFILLED_SYRINGE | INTRAVENOUS | Status: AC
  Filled 2020-07-30: qty 5

## 2020-07-30 MED ORDER — KETAMINE HCL 50 MG/5ML IJ SOSY
PREFILLED_SYRINGE | INTRAMUSCULAR | Status: AC
  Filled 2020-07-30: qty 5

## 2020-07-30 MED ORDER — FENTANYL CITRATE (PF) 100 MCG/2ML IJ SOLN
25.0000 ug | INTRAMUSCULAR | Status: DC | PRN
  Administered 2020-07-30: 50 ug via INTRAVENOUS
  Administered 2020-07-30: 25 ug via INTRAVENOUS

## 2020-07-30 MED ORDER — CHLORHEXIDINE GLUCONATE 0.12 % MT SOLN: 15.0000 mL | Freq: Once | OROMUCOSAL | Status: DC

## 2020-07-30 MED ORDER — ACETAMINOPHEN 10 MG/ML IV SOLN
INTRAVENOUS | Status: AC
  Filled 2020-07-30: qty 100

## 2020-07-30 MED ORDER — LIDOCAINE HCL 1 % IJ SOLN
INTRAMUSCULAR | Status: DC | PRN
  Administered 2020-07-30: 20 mL
  Administered 2020-07-30: 10 mL

## 2020-07-30 MED ORDER — SODIUM CHLORIDE 0.9 % IV SOLN: INTRAVENOUS | Status: DC

## 2020-07-30 MED ORDER — FENTANYL CITRATE (PF) 100 MCG/2ML IJ SOLN
INTRAMUSCULAR | Status: AC
  Administered 2020-07-30: 25 ug via INTRAVENOUS
  Filled 2020-07-30: qty 2

## 2020-07-30 MED ORDER — FENTANYL CITRATE (PF) 100 MCG/2ML IJ SOLN
INTRAMUSCULAR | Status: AC
  Filled 2020-07-30: qty 2

## 2020-07-30 MED ORDER — BUPIVACAINE-EPINEPHRINE (PF) 0.5% -1:200000 IJ SOLN
INTRAMUSCULAR | Status: DC | PRN
  Administered 2020-07-30: 20 mL

## 2020-07-30 MED ORDER — IOHEXOL 180 MG/ML  SOLN
INTRAMUSCULAR | Status: DC | PRN
  Administered 2020-07-30: 20 mL

## 2020-07-30 SURGICAL SUPPLY — 27 items
ADH SKN CLS APL DERMABOND .7 (GAUZE/BANDAGES/DRESSINGS) ×1
CEMENT KYPHON CX01A KIT/MIXER (Cement) ×2 IMPLANT
COVER WAND RF STERILE (DRAPES) ×2 IMPLANT
DERMABOND ADVANCED (GAUZE/BANDAGES/DRESSINGS) ×1
DERMABOND ADVANCED .7 DNX12 (GAUZE/BANDAGES/DRESSINGS) ×1 IMPLANT
DEVICE BIOPSY BONE KYPH (INSTRUMENTS) ×1 IMPLANT
DEVICE BIOPSY BONE KYPHX (INSTRUMENTS) ×2 IMPLANT
DRAPE C-ARM XRAY 36X54 (DRAPES) ×2 IMPLANT
DURAPREP 26ML APPLICATOR (WOUND CARE) ×2 IMPLANT
FEE RENTAL RFA GENERATOR (MISCELLANEOUS) IMPLANT
GLOVE SURG SYN 9.0  PF PI (GLOVE) ×1
GLOVE SURG SYN 9.0 PF PI (GLOVE) ×1 IMPLANT
GOWN SRG 2XL LVL 4 RGLN SLV (GOWNS) ×1 IMPLANT
GOWN STRL NON-REIN 2XL LVL4 (GOWNS) ×2
GOWN STRL REUS W/ TWL LRG LVL3 (GOWN DISPOSABLE) ×1 IMPLANT
GOWN STRL REUS W/TWL LRG LVL3 (GOWN DISPOSABLE) ×2
KIT OSTEOCOOL BONE ACCESS 10G (MISCELLANEOUS) ×2 IMPLANT
KIT OSTEOCOOL DUAL PROBE 10MM (MISCELLANEOUS) ×1 IMPLANT
MANIFOLD NEPTUNE II (INSTRUMENTS) ×2 IMPLANT
PACK KYPHOPLASTY (MISCELLANEOUS) ×2 IMPLANT
RENTAL RFA  GENERATOR (MISCELLANEOUS)
RENTAL RFA GENERATOR (MISCELLANEOUS) ×2 IMPLANT
STRAP SAFETY 5IN WIDE (MISCELLANEOUS) ×2 IMPLANT
SWABSTK COMLB BENZOIN TINCTURE (MISCELLANEOUS) ×2 IMPLANT
TRAY KYPHOPAK 15/2 EXPRESS (KITS) ×1 IMPLANT
TRAY KYPHOPAK 15/3 EXPRESS 1ST (MISCELLANEOUS) ×2 IMPLANT
TRAY KYPHOPAK 20/3 EXPRESS 1ST (MISCELLANEOUS) ×2 IMPLANT

## 2020-07-30 NOTE — Op Note (Signed)
07/30/2020  1:21 PM  PATIENT:  Marissa Fletcher   MRN: [id number]   PRE-OPERATIVE DIAGNOSIS:  closed wedge compression fracture of T 10, possibly metastatic   POST-OPERATIVE DIAGNOSIS:  closed wedge compression fracture of T 10, possibly metastatic   PROCEDURE:  Procedure(s): KYPHOPLASTY T 10 with  Radiofrequency ablation  SURGEON: Laurene Footman, MD   ASSISTANTS: None   ANESTHESIA:   local and MAC   EBL:  No intake/output data recorded.   BLOOD ADMINISTERED:none   DRAINS: none    LOCAL MEDICATIONS USED:  MARCAINE    and XYLOCAINE    SPECIMEN:  T 10 vertebral body biopsy   DISPOSITION OF SPECIMEN: pathology   COUNTS:  YES   TOURNIQUET:  * No tourniquets in log *   IMPLANTS: Bone cement   DICTATION: .Dragon Dictation  patient was brought to the operating room and after adequate anesthesia was obtained the patient was placed prone.  C arm was brought in in good visualization of the affected level obtained on both AP and lateral projections.  After patient identification and timeout procedures were completed, local anesthetic was infiltrated with 10 cc 1% Xylocaine infiltrated subcutaneously.  This is done the area on the each side of the planned approach.  The back was then prepped and draped in the usual sterile manner and repeat timeout procedure carried out.  A spinal needle was brought down to the pedicle on the each side of T10 and a 50-50 mix of 1% Xylocaine half percent Sensorcaine with epinephrine total of 20 cc injected on each side.  After allowing this to set a small incision was made and the trocar was advanced into the vertebral body in an extrapedicular fashion.  Biopsy was obtained on both sides  Drilling was carried out and depth of RFA probe determined.  7.5 minutes through the 10 mm probes. Balloon inserted with inflation to 2 cc on the right and 1.5 cc on the left.  When the cement was appropriate consistency 3 cc were injected on the right and 32 cc on the left  into the vertebral body without extravasation, good fill superior to inferior endplates and from right to left sides along the inferior endplate.  After the cement had set the trochar was removed and permanent C-arm views obtained.  The wound was closed with Dermabond followed by [place]: discharge home after RR   PATIENT DISPOSITION:  PACU - hemodynamically stable.

## 2020-07-30 NOTE — Transfer of Care (Signed)
Immediate Anesthesia Transfer of Care Note  Patient: Marissa Fletcher  Procedure(s) Performed: T10 Kyphoplasty (N/A Thoracic)  Patient Location: PACU  Anesthesia Type:General  Level of Consciousness: awake, drowsy and patient cooperative  Airway & Oxygen Therapy: Patient Spontanous Breathing and Patient connected to nasal cannula oxygen  Post-op Assessment: Report given to RN and Post -op Vital signs reviewed and stable  Post vital signs: Reviewed and stable  Last Vitals:  Vitals Value Taken Time  BP 103/57 07/30/20 1315  Temp    Pulse 88 07/30/20 1316  Resp 15 07/30/20 1316  SpO2 95 % 07/30/20 1316  Vitals shown include unvalidated device data.  Last Pain:  Vitals:   07/30/20 1045  TempSrc: Temporal  PainSc: 6          Complications: No complications documented.

## 2020-07-30 NOTE — H&P (Signed)
Chief Complaint  Patient presents with  . Lower Back - Pain    History of the Present Illness: Marissa Fletcher is a 80 y.o. female here today.   The patient presents for evaluation of back pain. The patient has had recent MRI and PET scans that shows a T10 compression fracture, probably pathologic related to recently diagnosed lung cancer. She comes in today to discuss potential kyphoplasty. MRI was performed on 07/16/2020.  The patient states she fell on 04/26/2020.The patient rates her pain at a 3 out of 10 at rest, but if she gets up and starts to move, the pain can go up to an 8 or 9 out of 10. She takes hydrocodone for pain, and has been trying to take 1/2 tablet only. She denies any side effects. She also takes a muscle relaxant. The patient states the pain started 1 week before Christmas, 05/15/2020. She states the pain starts with a muscle running around the ribs. The patient notes she does not know yet whether she will undergo chemotherapy.   The patient was diagnosed with lung cancer when she was in the hospital on 07/01/2020. She saw Earlie Server, MD. She has osteoporosis. She has not had a bone density test. The patient takes aspirin 325 mg.  The patient presents with her sister.   I have reviewed past medical, surgical, social and family history, and allergies as documented in the EMR.  Past Medical History: Past Medical History:  Diagnosis Date  . Anxiety  . Depression  . Hypertension   Past Surgical History: Past Surgical History:  Procedure Laterality Date  . CHOLECYSTECTOMY  . HYSTERECTOMY   Past Family History: Family History  Problem Relation Age of Onset  . Dementia Mother  . No Known Problems Father   Medications: Current Outpatient Medications Ordered in Epic  Medication Sig Dispense Refill  . ALPRAZolam (XANAX) 0.25 MG tablet Take 1 tablet (0.25 mg total) by mouth once daily as needed for Anxiety 15 tablet 0  . aspirin 325 MG tablet Take 325 mg by mouth  once daily.  Marland Kitchen atorvastatin (LIPITOR) 20 MG tablet Take 1 tablet (20 mg total) by mouth once daily 90 tablet 1  . calcium citrate-vitamin D3 (CITRACAL+D) 315-200 mg-unit tablet Take 2 tablets by mouth 2 (two) times daily with meals.  . cholecalciferol (VITAMIN D3) 1,000 unit capsule Take 1,000 Units by mouth once daily.  . cranberry extract 650 mg Cap Take 42,000 mg by mouth 2 (two) times daily.  . cyanocobalamin (VITAMIN B12) 1000 MCG tablet Take 5,000 mcg by mouth once daily.  . diclofenac (VOLTAREN) 1 % topical gel Apply 2 g topically 4 (four) times daily as needed 100 g 0  . doxepin (SINEQUAN) 25 MG capsule TAKE 2 TO 3 CAPSULES BY MOUTH EVERY NIGHT 270 capsule 0  . gabapentin (NEURONTIN) 300 MG capsule TAKE 1 CAPSULE(300 MG) BY MOUTH EVERY NIGHT 90 capsule 1  . HYDROcodone-acetaminophen (NORCO) 5-325 mg tablet Take 1 tablet by mouth every 6 (six) hours as needed  . lisinopriL-hydrochlorothiazide (ZESTORETIC) 20-12.5 mg tablet TAKE 1 TABLET BY MOUTH EVERY DAY 90 tablet 3  . metaxalone (SKELAXIN) 400 mg tablet Take 1 tablet (400 mg total) by mouth 3 (three) times daily as needed . Should last at least 1 month or longer 25 tablet 0  . metFORMIN (GLUCOPHAGE-XR) 500 MG XR tablet TAKE 1 TABLET(500 MG) BY MOUTH TWICE DAILY 180 tablet 1  . MULTIVIT-MIN/FOLIC ACID/VIT K1 (TRUEPLUS DIABETIC MULTIVITAMIN ORAL) Take by mouth.  Marland Kitchen  oxyCODONE-acetaminophen (PERCOCET) 5-325 mg tablet Take 1 tablet by mouth every 8 (eight) hours as needed for Pain for up to 5 days 15 tablet 0  . PARoxetine (PAXIL) 30 MG tablet TAKE 1 TABLET(30 MG) BY MOUTH EVERY DAY 90 tablet 1  . lactulose (ENULOSE) 10 gram/15 mL oral solution Take 30 mLs by mouth 2 (two) times daily as needed for Constipation for up to 10 days 473 mL 0   No current Epic-ordered facility-administered medications on file.   Allergies: Allergies  Allergen Reactions  . Sulfa (Sulfonamide Antibiotics) Swelling    Body mass index is 25.16 kg/m.  Review of  Systems: A comprehensive 14 point ROS was performed, reviewed, and the pertinent orthopaedic findings are documented in the HPI.  Vitals:  07/28/20 0901  BP: 134/82    General Physical Examination:   General/Constitutional: No apparent distress: well-nourished and well developed. Eyes: Pupils equal, round with synchronous movement. Lungs: Wheezing is noted HEENT: Teeth are in poor condition. Vascular: No edema, swelling or tenderness, except as noted in detailed exam. Cardiac: Heart rate and rhythm is regular. Integumentary: No impressive skin lesions present, except as noted in detailed exam. Neuro/Psych: Normal mood and affect, oriented to person, place and time.  Musculoskeletal Examination:  On exam, tenderness to T10. No clonus bilaterally.  Radiographs:  No new imaging studies were obtained today.  Assessment: ICD-10-CM  1. 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 Diabetes mellitus without complication (CMS-HCC) N27.7   Plan:  The patient has clinical findings of pathologic T10 fracture with no underlying diagnosis.  We discussed the patient's prior MRI findings. I explained she has a T10 fracture. I recommend a T10 kyphoplasty with RFA, with multiple biopsies planned to get pathologic diagnosis. I gave the patient a brochure on kyphoplasty. We discussed a possible tumor.   The patient will be scheduled for surgery in the near future.  Surgical Risks:  The nature of the condition and the proposed procedure has been reviewed in detail with the patient. Surgical versus non-surgical options and prognosis for recovery have been reviewed and the inherent risks and benefits of each have been discussed including the risks of infection, bleeding, injury to nerves/blood vessels/tendons, incomplete relief of symptoms, persisting pain and/or stiffness, loss of function, complex regional pain syndrome, failure of the procedure, as appropriate.  Teeth: Upper  plate, teeth in poor condition.  Attestation: I, Dawn Royse, am documenting for [employer], MD utilizing [employer].    Reviewed  H+P. No changes noted.

## 2020-07-30 NOTE — Anesthesia Preprocedure Evaluation (Signed)
Anesthesia Evaluation  Patient identified by MRN, date of birth, ID band Patient awake    Reviewed: Allergy & Precautions, NPO status , Patient's Chart, lab work & pertinent test results  History of Anesthesia Complications Negative for: history of anesthetic complications  Airway Mallampati: II  TM Distance: >3 FB Neck ROM: Full    Dental  (+) Edentulous Upper, Poor Dentition, Missing   Pulmonary neg sleep apnea, neg COPD, Current SmokerPatient did not abstain from smoking.,    breath sounds clear to auscultation- rhonchi (-) wheezing      Cardiovascular hypertension, Pt. on medications (-) CAD, (-) Past MI, (-) Cardiac Stents and (-) CABG  Rhythm:Regular Rate:Normal - Systolic murmurs and - Diastolic murmurs    Neuro/Psych neg Seizures PSYCHIATRIC DISORDERS Anxiety Depression negative neurological ROS     GI/Hepatic negative GI ROS, Neg liver ROS,   Endo/Other  diabetes, Oral Hypoglycemic Agents  Renal/GU negative Renal ROS     Musculoskeletal  (+) Arthritis ,   Abdominal (+) - obese,   Peds  Hematology  (+) anemia ,   Anesthesia Other Findings Past Medical History: No date: Anxiety No date: Arthritis No date: Depression No date: Diabetes mellitus without complication (HCC) No date: History of kidney stones No date: Hypertension   Reproductive/Obstetrics                             Anesthesia Physical Anesthesia Plan  ASA: III  Anesthesia Plan: General   Post-op Pain Management:    Induction: Intravenous  PONV Risk Score and Plan: 1 and Propofol infusion  Airway Management Planned: Natural Airway  Additional Equipment:   Intra-op Plan:   Post-operative Plan:   Informed Consent: I have reviewed the patients History and Physical, chart, labs and discussed the procedure including the risks, benefits and alternatives for the proposed anesthesia with the patient or  authorized representative who has indicated his/her understanding and acceptance.     Dental advisory given  Plan Discussed with: CRNA and Anesthesiologist  Anesthesia Plan Comments:         Anesthesia Quick Evaluation

## 2020-07-30 NOTE — Anesthesia Procedure Notes (Signed)
Procedure Name: MAC Date/Time: 07/30/2020 12:05 PM Performed by: Jerrye Noble, CRNA Pre-anesthesia Checklist: Patient identified, Emergency Drugs available, Suction available and Patient being monitored Patient Re-evaluated:Patient Re-evaluated prior to induction Oxygen Delivery Method: Simple face mask

## 2020-07-30 NOTE — Discharge Instructions (Addendum)
DR. Rudene Christians INSTRUCTIONS: Take it easy today, resume walking as much as you can tomorrow. Remove band aids on Sunday, then ok to shower. Pain medication as directed Call office if you are having problems  AMBULATORY SURGERY  DISCHARGE INSTRUCTIONS   1) The drugs that you were given will stay in your system until tomorrow so for the next 24 hours you should not:  A) Drive an automobile B) Make any legal decisions C) Drink any alcoholic beverage   2) You may resume regular meals tomorrow.  Today it is better to start with liquids and gradually work up to solid foods.  You may eat anything you prefer, but it is better to start with liquids, then soup and crackers, and gradually work up to solid foods.   3) Please notify your doctor immediately if you have any unusual bleeding, trouble breathing, redness and pain at the surgery site, drainage, fever, or pain not relieved by medication.    4) Additional Instructions:        Please contact your physician with any problems or Same Day Surgery at [phone], Monday through Friday 6 am to 4 pm, or [place] at [facility] number at [phone].

## 2020-07-30 NOTE — Anesthesia Postprocedure Evaluation (Signed)
Anesthesia Post Note  Patient: [name]  Procedure(s) Performed: T10 Kyphoplasty (N/A Thoracic)  Patient location during evaluation: PACU Anesthesia Type: General Level of consciousness: awake and alert and oriented Pain management: pain level controlled Vital Signs Assessment: post-procedure vital signs reviewed and stable Respiratory status: spontaneous breathing, nonlabored ventilation and respiratory function stable Cardiovascular status: blood pressure returned to baseline and stable Postop Assessment: no signs of nausea or vomiting Anesthetic complications: no   No complications documented.   Last Vitals:  Vitals:   07/30/20 1400 07/30/20 1415  BP: 99/69 (!) 112/54  Pulse: 89 83  Resp: 14 17  Temp: 36.6 C 36.6 C  SpO2: 93% 93%    Last Pain:  Vitals:   07/30/20 1415  TempSrc:   PainSc: 3                  Bayron Dalto

## 2020-08-02 ENCOUNTER — Encounter: Payer: Self-pay | Admitting: Orthopedic Surgery

## 2020-08-02 ENCOUNTER — Ambulatory Visit: Payer: Medicare Other | Admitting: Oncology

## 2020-08-04 LAB — SURGICAL PATHOLOGY

## 2020-08-05 ENCOUNTER — Other Ambulatory Visit: Payer: Self-pay | Admitting: *Deleted

## 2020-08-05 DIAGNOSIS — R911 Solitary pulmonary nodule: Secondary | ICD-10-CM

## 2020-08-09 ENCOUNTER — Other Ambulatory Visit: Payer: Self-pay

## 2020-08-09 ENCOUNTER — Other Ambulatory Visit
Admission: RE | Admit: 2020-08-09 | Discharge: 2020-08-09 | Disposition: A | Discharge status: Home / Self Care | Payer: Medicare Other | Source: Ambulatory Visit | Attending: Oncology | Admitting: Oncology

## 2020-08-09 DIAGNOSIS — Z01812 Encounter for preprocedural laboratory examination: Secondary | ICD-10-CM | POA: Diagnosis present

## 2020-08-09 DIAGNOSIS — Z20822 Contact with and (suspected) exposure to covid-19: Secondary | ICD-10-CM | POA: Diagnosis not present

## 2020-08-09 LAB — SARS CORONAVIRUS 2 (TAT 6-24 HRS): SARS Coronavirus 2: NEGATIVE

## 2020-08-10 ENCOUNTER — Other Ambulatory Visit: Payer: Self-pay | Admitting: Student

## 2020-08-11 ENCOUNTER — Other Ambulatory Visit: Payer: Self-pay

## 2020-08-11 ENCOUNTER — Ambulatory Visit
Admission: RE | Admit: 2020-08-11 | Discharge: 2020-08-11 | Disposition: A | Discharge status: Home / Self Care | Payer: Medicare Other | Source: Ambulatory Visit | Attending: Interventional Radiology | Admitting: Interventional Radiology

## 2020-08-11 ENCOUNTER — Ambulatory Visit
Admission: RE | Admit: 2020-08-11 | Discharge: 2020-08-11 | Disposition: A | Discharge status: Home / Self Care | Payer: Medicare Other | Source: Ambulatory Visit | Attending: Oncology | Admitting: Oncology

## 2020-08-11 DIAGNOSIS — R911 Solitary pulmonary nodule: Secondary | ICD-10-CM | POA: Insufficient documentation

## 2020-08-11 DIAGNOSIS — Z7984 Long term (current) use of oral hypoglycemic drugs: Secondary | ICD-10-CM | POA: Insufficient documentation

## 2020-08-11 DIAGNOSIS — J95811 Postprocedural pneumothorax: Secondary | ICD-10-CM

## 2020-08-11 LAB — PROTIME-INR
INR: 0.9 (ref 0.8–1.2)
Prothrombin Time: 12.1 seconds (ref 11.4–15.2)

## 2020-08-11 LAB — CBC
HCT: 38.9 % (ref 36.0–46.0)
Hemoglobin: 12.5 g/dL (ref 12.0–15.0)
MCH: 26.5 pg (ref 26.0–34.0)
MCHC: 32.1 g/dL (ref 30.0–36.0)
MCV: 82.6 fL (ref 80.0–100.0)
Platelets: 362 10*3/uL (ref 150–400)
RBC: 4.71 MIL/uL (ref 3.87–5.11)
RDW: 18.6 % — ABNORMAL HIGH (ref 11.5–15.5)
WBC: 6.8 10*3/uL (ref 4.0–10.5)
nRBC: 0 % (ref 0.0–0.2)

## 2020-08-11 LAB — GLUCOSE, CAPILLARY: Glucose-Capillary: 124 mg/dL — ABNORMAL HIGH (ref 70–99)

## 2020-08-11 IMAGING — CT CT BIOPSY
2 of 4 series · 13 of 32 positions shown, 18 images · non-contrast
Comparison: none

INDICATION: 79-year-old female with a history of right upper lobe lung nodule
FDG avid

[Series 2: i-spiral 5.0 b30f · axial · 0.66mm/px · z∈[+1071,+1155]mm · 7 of 33 slices shown, 12 images (1 of 2)]
[im 5/33  soft-tissue]
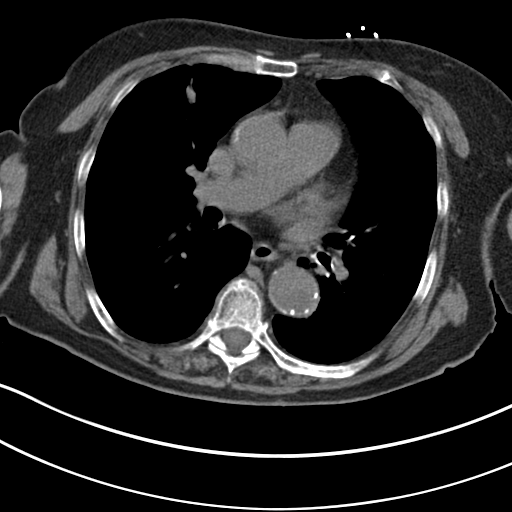
[im 5/33  bone]
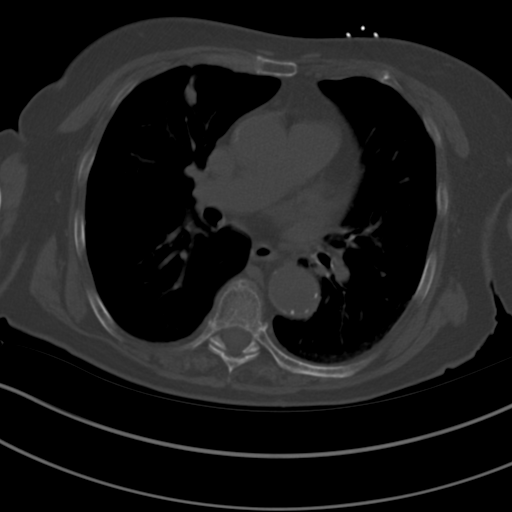
[im 9/33  soft-tissue]
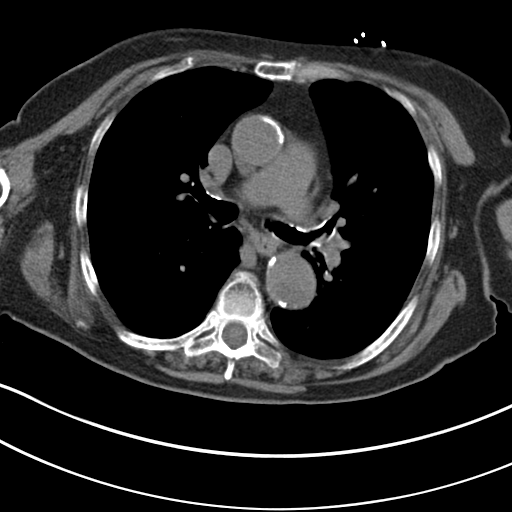
[im 13/33  soft-tissue]
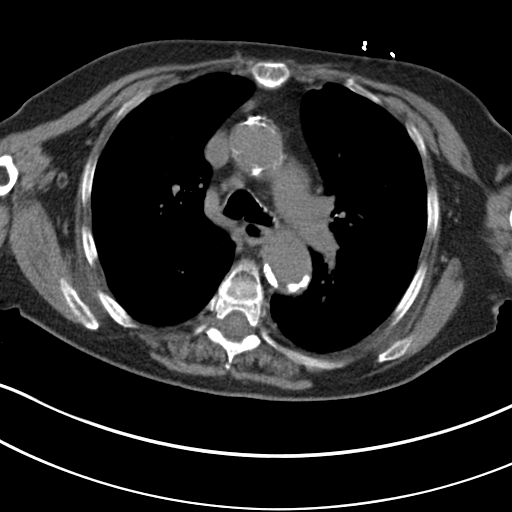
[im 17/33  soft-tissue]
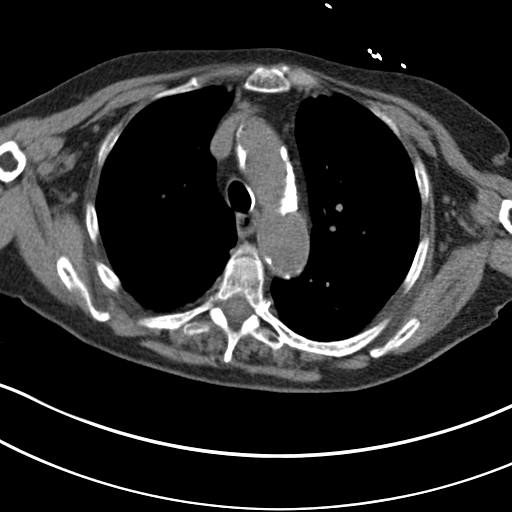
[im 17/33  lung]
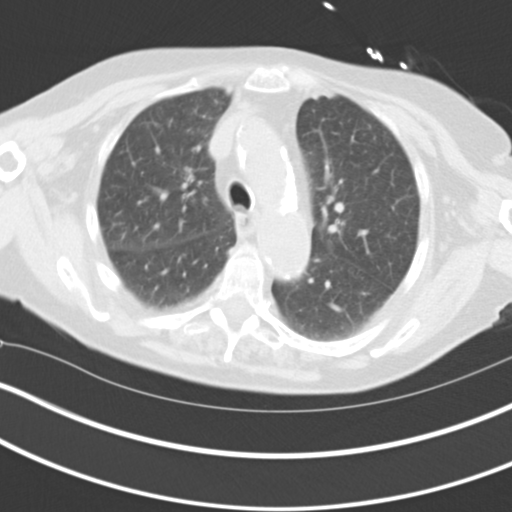
[im 21/33  soft-tissue]
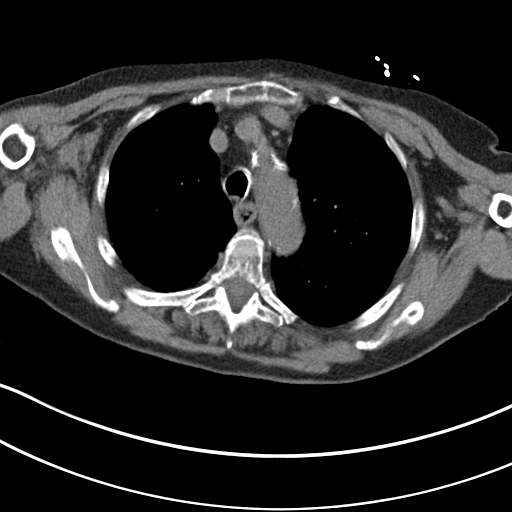
[im 21/33  lung]
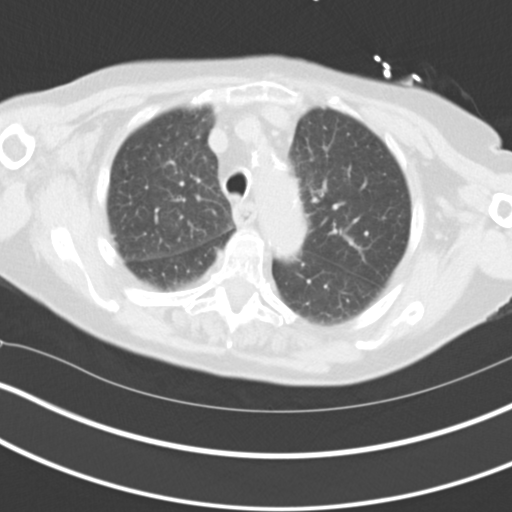
[im 25/33  soft-tissue]
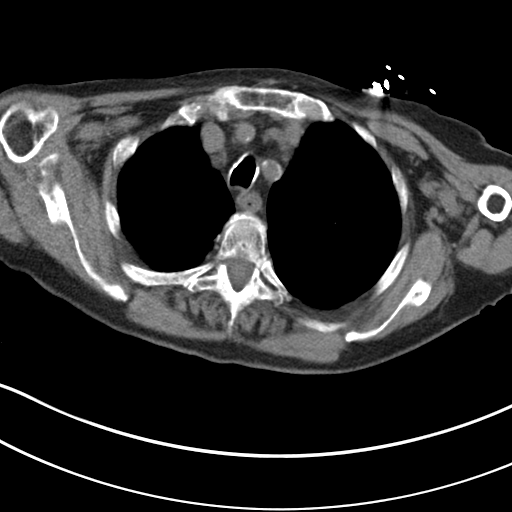
[im 25/33  lung]
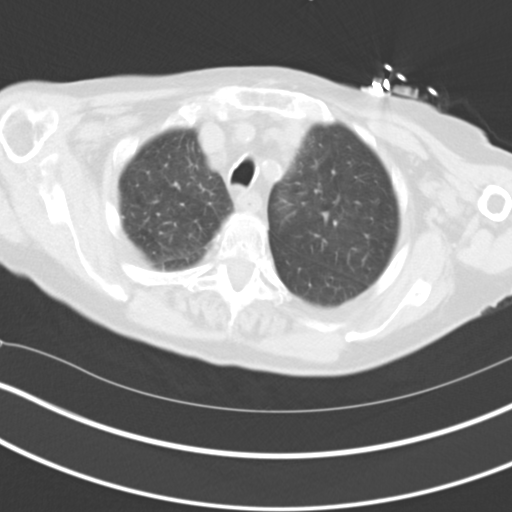
[im 29/33  soft-tissue]
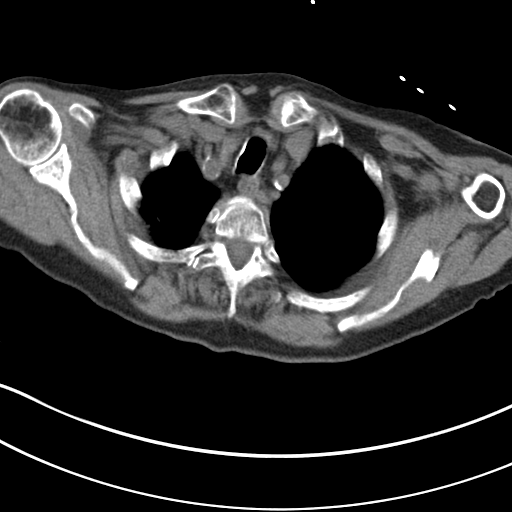
[im 29/33  lung]
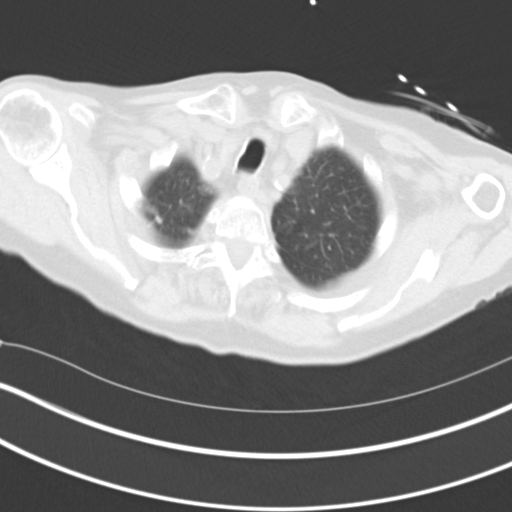

[Series 5: i-spiral 5.0 b30f · axial · 0.57mm/px · z∈[+1025,+1095]mm · 6 of 34 slices shown (2 of 2)]
[im 5/34  soft-tissue]
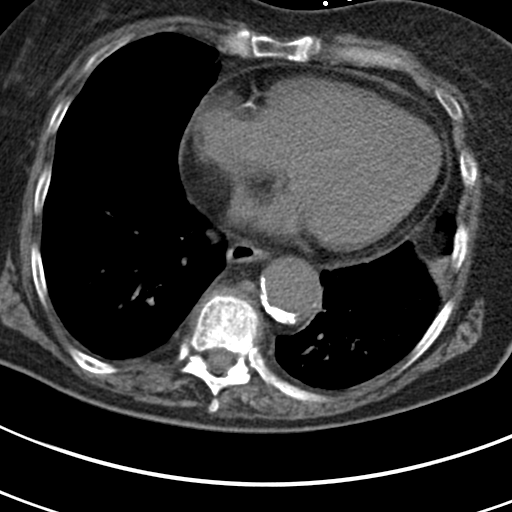
[im 9/34  soft-tissue]
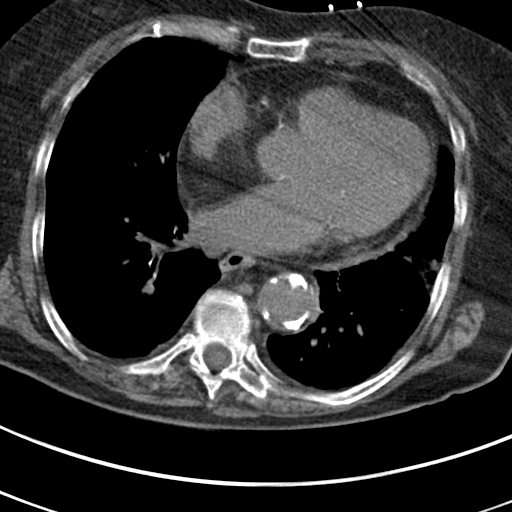
[im 13/34  soft-tissue]
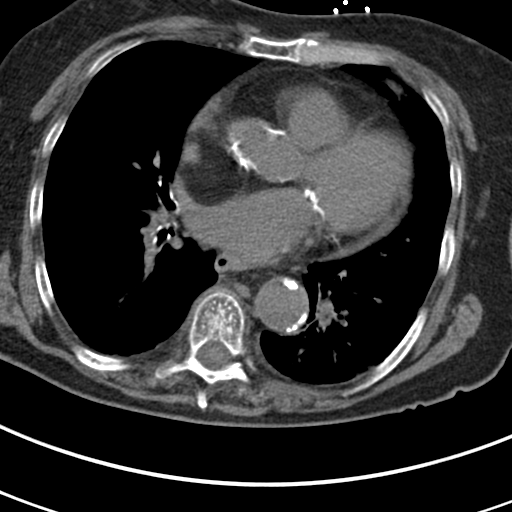
[im 17/34  soft-tissue]
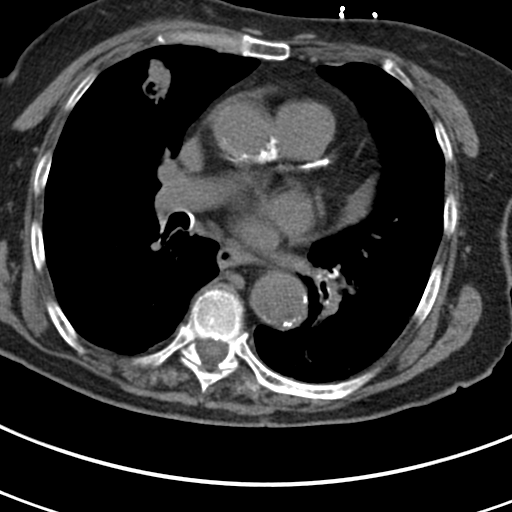
[im 21/34  soft-tissue]
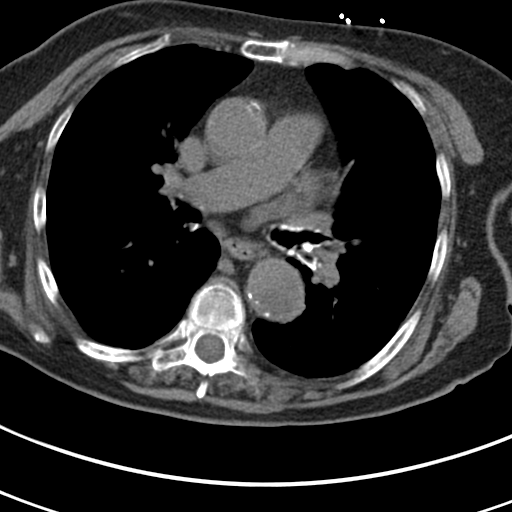
[im 25/34  soft-tissue]
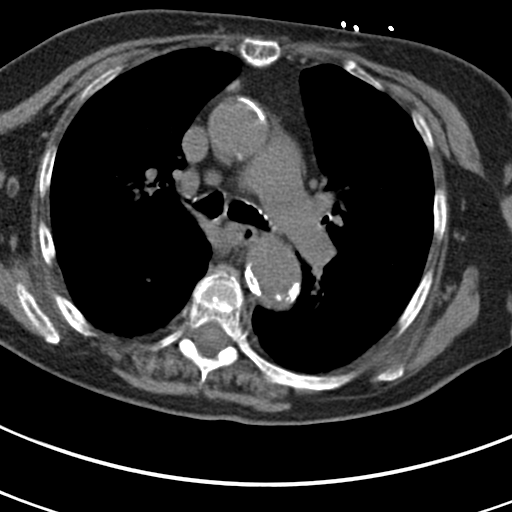

[13 of 32 positions shown; findings below may reference images not displayed]

EXAM:
CT BIOPSY

MEDICATIONS:
None.

ANESTHESIA/SEDATION:
Moderate (conscious) sedation was employed during this procedure. A
total of Versed 1.0 mg and Fentanyl 25 mcg was administered
intravenously.

Moderate Sedation Time: 16 minutes. The patient's level of
consciousness and vital signs were monitored continuously by
radiology nursing throughout the procedure under my direct
supervision.

FLUOROSCOPY TIME:  CT

COMPLICATIONS:
None

PROCEDURE:
The procedure, risks, benefits, and alternatives were explained to
the patient and the patient's family. Specific risks that were
addressed included bleeding, infection, pneumothorax, need for
further procedure including chest tube placement, chance of delayed
pneumothorax or hemorrhage, hemoptysis, nondiagnostic sample,
cardiopulmonary collapse, death. Questions regarding the procedure
were encouraged and answered. The patient understands and consents
to the procedure.

Patient was positioned in the supine position on the CT gantry table
and a scout CT of the chest was performed for planning purposes.

Once angle of approach was determined, the skin and subcutaneous
tissues this scan was prepped and draped in the usual sterile
fashion, and a sterile drape was applied covering the operative
field. A sterile gown and sterile gloves were used for the
procedure. Local anesthesia was provided with 1% Lidocaine.

The skin and subcutaneous tissues were infiltrated 1% lidocaine for
local anesthesia, and a small stab incision was made with an 11
blade scalpel.

Using CT guidance, a 17 gauge trocar needle was advanced into the
right upper lobetarget. After confirmation of the tip, separate 18
gauge core biopsies were performed. These were placed into solution
for transportation to the lab.

Biosentry Device was deployed. A final CT image was performed.

Patient tolerated the procedure well and remained hemodynamically
stable throughout.

No complications were encountered and no significant blood loss was
encounter
IMPRESSION: Status post CT-guided biopsy of right upper lobe lung nodule.

## 2020-08-11 IMAGING — DX DG CHEST 1V PORT
1 series · 1 of 1 positions shown · non-contrast
Comparison: [DATE], prior chest x-ray [DATE]

CLINICAL DATA: 79-year-old female with a history of right-sided
nodule biopsy

EXAM:
PORTABLE CHEST 1 VIEW

[chest ap]
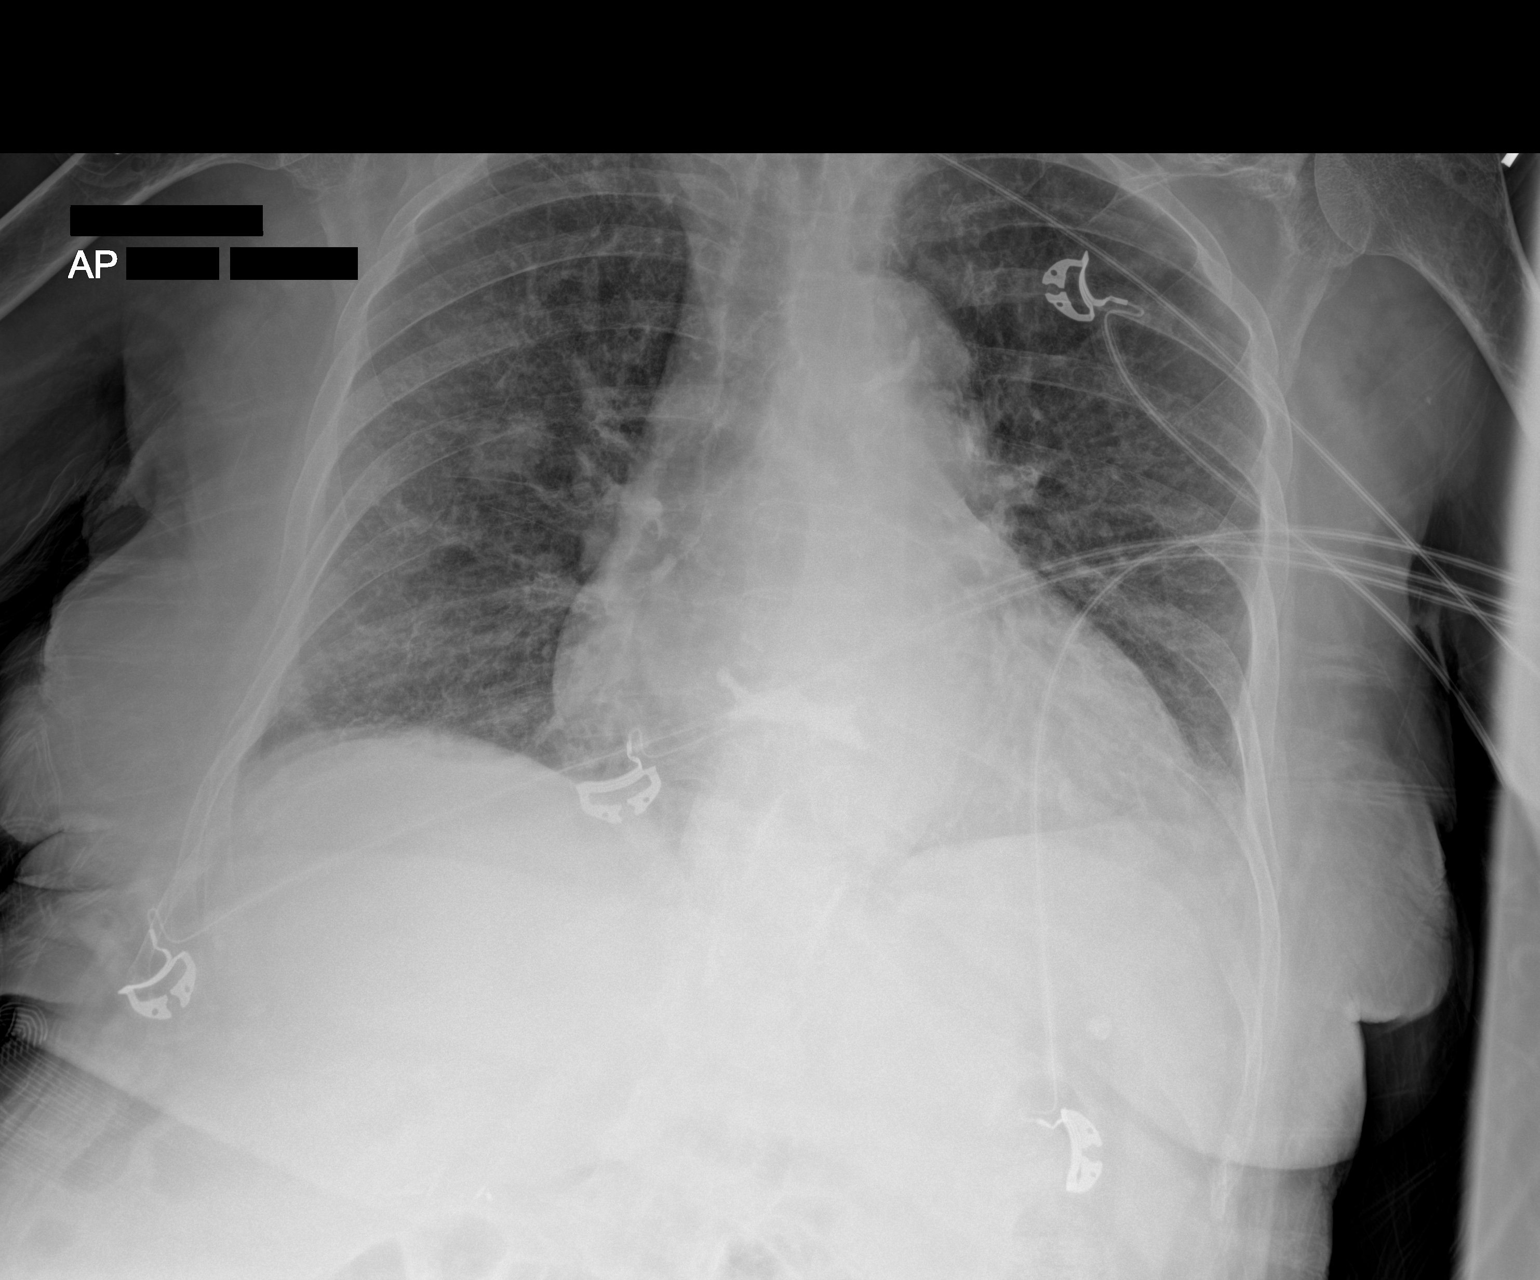

[1 of 1 positions shown; findings below may reference images not displayed]

FINDINGS: Cardiomediastinal silhouette unchanged in size and contour.
Coarsened interstitial markings persist.

No pneumothorax on the left to the right. No pleural effusion. The
right upper lobe nodule is better demonstrated on the prior CT,
present in the right mid lung.
IMPRESSION: No complicating features status post right-sided lung nodule biopsy.

## 2020-08-11 MED ORDER — FENTANYL CITRATE (PF) 100 MCG/2ML IJ SOLN
INTRAMUSCULAR | Status: AC | PRN
  Administered 2020-08-11: 25 ug via INTRAVENOUS

## 2020-08-11 MED ORDER — MIDAZOLAM HCL 2 MG/2ML IJ SOLN
INTRAMUSCULAR | Status: AC
  Filled 2020-08-11: qty 2

## 2020-08-11 MED ORDER — SODIUM CHLORIDE 0.9 % IV SOLN: INTRAVENOUS | Status: DC

## 2020-08-11 MED ORDER — FENTANYL CITRATE (PF) 100 MCG/2ML IJ SOLN
INTRAMUSCULAR | Status: AC
  Filled 2020-08-11: qty 2

## 2020-08-11 MED ORDER — MIDAZOLAM HCL 2 MG/2ML IJ SOLN
INTRAMUSCULAR | Status: AC | PRN
  Administered 2020-08-11 (×2): 0.5 mg via INTRAVENOUS

## 2020-08-11 NOTE — H&P (Signed)
Chief Complaint: RIght lung nodule  Referring Physician(s): Yu,Zhou  Supervising Physician: Corrie Mckusick  Patient Status: [facility] - Out-pt  History of Present Illness: Marissa Fletcher is a 80 y.o. 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   She denies any new symptoms.  Her complaints are of a "dry mouth".     Past Medical History:  Diagnosis Date  . Anxiety   . Arthritis   . Depression   . Diabetes mellitus without complication ([place])   . History of kidney stones   . Hypertension     Past Surgical History:  Procedure Laterality Date  . ABDOMINAL HYSTERECTOMY    . CHOLECYSTECTOMY    . EYE SURGERY Bilateral   . KYPHOPLASTY N/A 07/30/2020   Procedure: T10 Kyphoplasty;  Surgeon: Hessie Knows, MD;  Location: [facility] ORS;  Service: Orthopedics;  Laterality: N/A;  T10    Allergies: Sulfa antibiotics  Medications: Prior to Admission medications   Medication Sig Start Date End Date Taking? Authorizing Provider  atorvastatin (LIPITOR) 20 MG tablet Take 20 mg by mouth daily. 04/19/20 04/19/21 Yes [provider]  diclofenac Sodium (VOLTAREN) 1 % GEL Apply topically. 05/18/20 05/18/21 Yes [provider]  doxepin (SINEQUAN) 25 MG capsule Take 50-75 mg by mouth at bedtime. 04/01/20  Yes [provider]  gabapentin (NEURONTIN) 300 MG capsule Take 300 mg by mouth daily. 02/18/20  Yes [provider]  HYDROcodone-acetaminophen (NORCO/VICODIN) 5-325 MG tablet Take 1 tablet by mouth every 4 (four) hours as needed for severe pain. 07/30/20  Yes Hessie Knows, MD  lidocaine (LIDODERM) 5 % Place 1 patch onto the skin daily. Remove & Discard patch within 12 hours or as directed by MD 05/12/20  Yes Loletha Grayer, MD  lisinopril-hydrochlorothiazide (ZESTORETIC) 20-12.5 MG tablet Take 1 tablet by mouth daily. 02/27/20  Yes [provider]  metFORMIN (GLUCOPHAGE-XR) 500 MG 24 hr tablet Take  500 mg by mouth 2 (two) times daily. 03/08/20  Yes [provider]  nicotine (NICODERM CQ - DOSED IN MG/24 HOURS) 21 mg/24hr patch One 21 mg patch chest wall daily (okay to substitute generic) 05/12/20  Yes Wieting, Richard, MD  PARoxetine (PAXIL) 30 MG tablet Take 30 mg by mouth daily. 03/08/20  Yes [provider]  polyethylene glycol (MIRALAX / GLYCOLAX) 17 g packet Take 17 g by mouth daily as needed for moderate constipation. 05/12/20  Yes Wieting, Richard, MD  ALPRAZolam Duanne Moron) 0.25 MG tablet Take 0.25 mg by mouth daily as needed for anxiety. 04/19/20   [provider]     Family History  Problem Relation Age of Onset  . Dementia Mother   . Congestive Heart Failure Mother   . Bladder Cancer Father   . Heart disease Father   . Breast cancer Neg Hx     Social History   Socioeconomic History  . Marital status: Single    Spouse name: Not on file  . Number of children: Not on file  . Years of education: Not on file  . Highest education level: Not on file  Occupational History  . Not on file  Tobacco Use  . Smoking status: Current Every Day Smoker    Packs/day: 0.50    Years: 68.00    Pack years: 34.00    Types: Cigarettes  . Smokeless tobacco: Never Used  Vaping Use  . Vaping Use: Never used  Substance and Sexual Activity  . Alcohol use: Never  .  Drug use: Never  . Sexual activity: Not on file  Other Topics Concern  . Not on file  Social History Narrative  . Not on file   Social Determinants of Health   Financial Resource Strain: Not on file  Food Insecurity: Not on file  Transportation Needs: Not on file  Physical Activity: Not on file  Stress: Not on file  Social Connections: Not on file      Review of Systems: A 12 point ROS discussed and pertinent positives are indicated in the HPI above.  All other systems are negative.  Review of Systems  Vital Signs: BP (!) 145/78   Pulse 93   Temp 98.4 F (36.9 C) (Oral)   Resp 20    Ht 5\' 4"  (1.626 m)   Wt 66.2 kg   SpO2 96%   BMI 25.06 kg/m   Physical Exam General: 79 yo female appearing stated age.  Well-developed, well-nourished.  No distress. HEENT: Atraumatic, normocephalic.  Conjugate gaze, extra-ocular motor intact. No scleral icterus or scleral injection. No lesions on external ears, nose, lips, or gums.  Oral mucosa moist, pink.  Neck: Symmetric with no goiter enlargement.  Chest/Lungs:  Symmetric chest with inspiration/expiration.  No labored breathing.  Clear to auscultation with no wheezes, rhonchi, or rales.  Heart:  RRR, with no third heart sounds appreciated. No JVD appreciated.  Abdomen:  Soft, NT/ND, with + bowel sounds.   Genito-urinary: Deferred Neurologic: Alert & Oriented to person, place, and time.   Normal affect and insight.  Appropriate questions.  Moving all 4 extremities with gross sensory intact.       Imaging: DG Thoracic Spine 2 View  Result Date: 07/30/2020 CLINICAL DATA:  Kyphoplasty EXAM: DG C-ARM 1-60 MIN; THORACIC SPINE 2 VIEWS FLUOROSCOPY TIME:  Fluoroscopy Time:  30.7 seconds COMPARISON:  MRI July 16, 2020. FINDINGS: Two C-arm fluoroscopic images were obtained intraoperatively and submitted for post operative interpretation. These images demonstrate kyphoplasty at the T10 level without definite extravasation of cement. please see the performing provider's procedural report for further detail. IMPRESSION: T10 kyphoplasty. Electronically Signed   By: Margaretha Sheffield MD   On: 07/30/2020 15:03   MR Thoracic Spine W Wo Contrast  Result Date: 07/16/2020 CLINICAL DATA:  Evaluate T10 compression fracture identified on recent PET-CT. EXAM: MRI THORACIC WITHOUT AND WITH CONTRAST TECHNIQUE: Multiplanar and multiecho pulse sequences of the thoracic spine were obtained without and with intravenous contrast. CONTRAST:  2mL GADAVIST GADOBUTROL 1 MMOL/ML IV SOLN COMPARISON:  PET-CT 07/13/2020.  CT chest 07/12/2019 FINDINGS: Alignment: Mildly  exaggerated thoracic kyphosis. No static listhesis. Vertebrae: Acute T10 vertebral body compression fracture with approximately 75% vertebral body height loss centrally. 4 mm bony retropulsion of the inferior endplate. Marrow edema throughout the vertebral body. There is diffusely low T1 marrow signal throughout the entirety of the T10 vertebral body which extends to involve the right transverse process and portion of the right pedicle (series 11, images 3-5). There is prominent postcontrast enhancement throughout the T10 vertebral body, right pedicle, and right transverse process (see series 24, images 2-11). No extraosseous soft tissue mass. No evidence of epidural involvement. Remote mild superior endplate compression fractures of T6 and T7 without residual bone marrow edema. Discogenic endplate marrow changes at L2-3. No additional sites of abnormal marrow signal or marrow replacement. No evidence of discitis. Cord: Normal signal and morphology. No cord edema or evidence of cord contusion. No abnormal enhancement on postcontrast sequences. Paraspinal and other soft tissues: Descending thoracic aorta  measures approximately 3.9 cm in diameter. Extensive noncalcified plaque resulting in areas of up to approximately 50% luminal narrowing (for example series 20, image 22). Overall, findings are similar to the previous CT angiogram 05/10/2020, when accounting for differences in technique. No soft tissue masses or abnormal enhancement. No edema or fluid collection. Disc levels: T10-11: Mild canal stenosis and mild bilateral foraminal stenosis secondary to bony retropulsion of the T10 inferior endplate and mild posterior element hypertrophy. Remaining thoracic intervertebral disc levels are within normal limits without focal disc protrusion, canal stenosis, or foraminal stenosis. IMPRESSION: 1. Acute pathologic T10 vertebral body compression fracture with approximately 75% vertebral body height loss and 4 mm of bony  retropulsion. Resultant mild canal stenosis and mild bilateral foraminal narrowing at this level. Findings suggestive of diffuse marrow replacement of the T10 vertebral body with involvement of the right transverse process and right pedicle. No extraosseous soft tissue mass or evidence of epidural involvement. 2. No additional sites of abnormal marrow signal or marrow replacement. 3. Remote mild superior endplate compression fractures of T6 and T7. 4. Descending thoracic aorta measures approximately 3.9 cm in diameter with extensive noncalcified plaque resulting in up to approximately 50% luminal narrowing. Overall, findings are similar to the previous CT angiogram 05/10/2020, when accounting for differences in technique. Electronically Signed   By: Davina Poke D.O.   On: 07/16/2020 11:09   NM PET Image Initial (PI) Skull Base To Thigh  Result Date: 07/14/2020 CLINICAL DATA:  Initial treatment strategy for lung lesion discovered on recent CT of the chest. EXAM: NUCLEAR MEDICINE PET SKULL BASE TO THIGH TECHNIQUE: 8.0 mCi F-18 FDG was injected intravenously. Full-ring PET imaging was performed from the skull base to thigh after the radiotracer. CT data was obtained and used for attenuation correction and anatomic localization. Fasting blood glucose: 139 mg/dl COMPARISON:  None. FINDINGS: Mediastinal blood pool activity: SUV max 2.11 Liver activity: SUV max not applicable NECK: No hypermetabolic lymph nodes in the neck. Incidental CT findings: None CHEST: RIGHT upper lobe nodule, the area of concern with spiculated features measuring approximately 1.9 x 1.8 cm with a maximum SUV of 9.4. No hypermetabolic lymph nodes. Background pulmonary emphysema. Stable nodule measuring 3 mm peripheral to this on image 90 of series 3 without associated FDG uptake. Incidental CT findings: Calcified atheromatous plaque of the thoracic aorta without aneurysm. Normal caliber central pulmonary vessels. Limited assessment of  cardiovascular structures given lack of intravenous contrast. Heart size stable. No pericardial effusion. Three-vessel coronary artery disease. Normal appearance of the esophagus. Pulmonary emphysema as described. 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 ABDOMEN/PELVIS: No abnormal hypermetabolic activity within the liver, pancreas, adrenal glands, or spleen. No hypermetabolic lymph nodes in the abdomen or pelvis. Adrenal glands are normal. Incidental CT findings: Post cholecystectomy. Liver, spleen, pancreas and adrenal glands without acute process. LEFT adrenal adenoma measuring approximately 1 cm. Mild cortical scarring of the kidneys. No hydronephrosis. Mild distension of fluid-filled bowel loops in the abdomen with similar appearance to previous imaging. Generalized uptake throughout the colon. Fat deposition in the submucosal aspect of the colonic wall. Colonic diverticulosis. Post hysterectomy. Extensive abdominal aortic atherosclerosis with 3.3 x 3.1 cm dilation of the infrarenal abdominal aorta. SKELETON: No focal hypermetabolic activity to suggest skeletal metastasis. Incidental CT findings: Interval greater than 50% loss of height at the T10 level as determined by the lowest rib-bearing vertebral body. Mild increased metabolic activity in this location, nonspecific in the setting of interval fracture. Upper thoracic and  midthoracic vertebral wedging at T7 and T6 with similar appearance. Spinal degenerative changes. IMPRESSION: Marked hypermetabolic activity associated with RIGHT upper lobe nodule compatible with bronchogenic neoplasm given morphologic features and metabolic activity. No adenopathy or distant metastatic disease. Small nonspecific pulmonary nodules without change or increased metabolic activity. Interval greater than 50% loss of height, compression fracture, of the T10 vertebral body since December of 2021, dedicated imaging may be helpful particularly there is ongoing  pain. Diffuse colonic activity is nonspecific and may be physiologic. Would however correlate with any signs of colitis. Calcified coronary artery disease. 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 For abdominal follow-up. Recommend follow-up ultrasound every 3 years. This recommendation follows ACR consensus guidelines: White Paper of the ACR Incidental Findings Committee II on Vascular Findings. J Am Coll Radiol 2013; 10:789-794. could consider annual follow-up for thoracic aortic dilation. Aortic Atherosclerosis (ICD10-I70.0) and Emphysema (ICD10-J43.9). Aortic aneurysm NOS (ICD10-I71.9). These results will be called to the ordering clinician or representative by the Radiologist Assistant, and communication documented in the PACS or [employer]. Electronically Signed   By: Zetta Bills M.D.   On: 07/14/2020 15:29   DG C-Arm 1-60 Min  Result Date: 07/30/2020 CLINICAL DATA:  Kyphoplasty EXAM: DG C-ARM 1-60 MIN; THORACIC SPINE 2 VIEWS FLUOROSCOPY TIME:  Fluoroscopy Time:  30.7 seconds COMPARISON:  MRI July 16, 2020. FINDINGS: Two C-arm fluoroscopic images were obtained intraoperatively and submitted for post operative interpretation. These images demonstrate kyphoplasty at the T10 level without definite extravasation of cement. please see the performing provider's procedural report for further detail. IMPRESSION: T10 kyphoplasty. Electronically Signed   By: Margaretha Sheffield MD   On: 07/30/2020 15:03    Labs:  CBC: Recent Labs    05/11/20 0422 05/12/20 0444 07/26/20 1042 08/11/20 0851  WBC 9.1 7.6 7.4 6.8  HGB 8.0* 8.7* 12.7 12.5  HCT 26.2* 28.4* 39.0 38.9  PLT 349 391 379 362    COAGS: Recent Labs    05/10/20 1921 07/26/20 1042 08/11/20 0851  INR 1.0 0.9 0.9  APTT 29 26  --     BMP: Recent Labs    05/10/20 1537 05/11/20 0422 05/12/20 0444  NA 134* 137 135  K 3.9 3.4* 3.7  CL 98 101 100  CO2 25 26 25   GLUCOSE 117* 111* 116*  BUN 12 10 7*  CALCIUM  8.7* 8.6* 8.7*  CREATININE 0.83 0.75 0.61  GFRNONAA >60 >60 >60    LIVER FUNCTION TESTS: Recent Labs    05/10/20 1537  BILITOT 0.5  AST 25  ALT 16  ALKPHOS 125  PROT 6.8  ALBUMIN 3.4*    TUMOR MARKERS: No results for input(s): AFPTM, CEA, CA199, CHROMGRNA in the last 8760 hours.  Assessment and Plan:  Ms Averitt is an 80 yo female presenting for RUL nodule biopsy.   Risks and benefits of CT guided lung nodule biopsy was discussed with the patient including, but not limited to bleeding, hemoptysis, respiratory failure requiring intubation, infection, pneumothorax requiring chest tube placement, stroke from air embolism or even death.  All of the patient's questions were answered and the patient is agreeable to proceed.  Consent signed and in chart.     Thank you for this interesting consult.  I greatly enjoyed meeting Marissa Fletcher and look forward to participating in their care.  A copy of this report was sent to the requesting provider on this date.  Electronically Signed: Corrie Mckusick, DO 08/11/2020, 9:39 AM   I spent a total  of  30 Minutes   in face to face in clinical consultation, greater than 50% of which was counseling/coordinating care for CT biopsy of RUL nodule.

## 2020-08-11 NOTE — Procedures (Signed)
Interventional Radiology Procedure Note  Procedure: CT guided biopsy of RUL nodule Complications: None Recommendations: - Bedrest until CXR cleared.  Minimize talking, coughing or otherwise straining.  - Follow up 1 hr CXR pending  - NPO until CXR cleared  Signed,  Corrie Mckusick, DO

## 2020-08-11 NOTE — Discharge Instructions (Signed)
Lung Biopsy, Care After  This information will help you take care of yourself after your procedure. Your health care provider may also give you more specific instructions depending on the type of biopsy you had. If you have problems or questions, contact your health care provider.  What can I expect after the procedure?  After the procedure, it is common to have:   A cough.   A sore throat.   Pain where a needle or incision was used to collect a biopsy sample (biopsy site).  Follow these instructions at home:  Medicines   Take over-the-counter and prescription medicines only as told by your health care provider.   Talk to your health care provider before you take any medicines that contain aspirin or NSAIDS, such as ibuprofen. These medicines can increase your risk of bleeding.   Ask your health care provider if the medicine prescribed to you:  ? Requires you to avoid driving or using machinery.  ? Can cause constipation. You may need to take these actions to prevent or treat constipation:   Drink enough fluid to keep your urine pale yellow.   Take over-the-counter or prescription medicines.   Eat foods that are high in fiber, such as beans, whole grains, fresh fruits and vegetables.   Limit foods that are high in fat and processed sugars, such as fried or sweet foods.  Biopsy site care     If you had a needle or open biopsy, follow instructions from your health care provider about how to take care of your biopsy site. Make sure you:  ? Wash your hands with soap and water for at least 20 seconds before and after you change your bandage (dressing). If soap and water are not available, use hand sanitizer.  ? Change your dressing as told by your health care provider.  ? Leave stitches (sutures), skin glue, or adhesive strips in place for as long as you are told. If adhesive strip edges start to loosen and curl up, you may trim the loose edges. Do not remove adhesive strips completely unless your health care  provider tells you to do that.   Do not take baths, swim, or use a hot tub until your health care provider approves. Ask your health care provider if you may take showers. You may only be allowed to take sponge baths.   Check your biopsy site every day for signs of infection. Check for:  ? Redness, swelling, or more pain.  ? Fluid or blood.  ? Warmth.  ? Pus or a bad smell.  General instructions   Do not drink alcohol if your health care provider tells you not to drink.   If you were given a sedative during the procedure, it can affect you for several hours. Do not drive or operate machinery until your health care provider says that it is safe.   Return to your normal activities as told by your health care provider. Ask your health care provider what activities are safe for you.   It is up to you to get the results of your procedure. Ask your health care provider, or the department that is doing the procedure, when your results will be ready.   Keep all follow-up visits as told by your health care provider. This is important.  Contact a health care provider if:   You have a fever.   You have redness, swelling, or more pain around your biopsy site.   You have fluid or blood   coming from your biopsy site.   Your biopsy site feels warm to the touch.   You have pus or a bad smell coming from your biopsy site.   You have pain that does not get better with medicine.  Get help right away if:   You cough up blood.   You have trouble breathing.   You have chest pain.   You lose consciousness.  These symptoms may represent a serious problem that is an emergency. Do not wait to see if the symptoms will go away. Get medical help right away. Call your local emergency services (911 in the [place] Do not drive yourself to the hospital.  Summary   It is common to have some pain where a needle or incision was used to collect a biopsy sample (biopsy site).   Return to your normal activities as told by your health  care provider. Ask your health care provider what activities are safe for you.   Take over-the-counter and prescription medicines only as told by your health care provider.   Report any unusual symptoms to your health care provider.  This information is not intended to replace advice given to you by your health care provider. Make sure you discuss any questions you have with your health care provider.  Document Revised: 04/19/2019 Document Reviewed: 04/19/2019  Elsevier Patient Education  2021 [employer]

## 2020-08-11 NOTE — Progress Notes (Signed)
Patient clinically stable post right upper lobe lung biopsy per Dr Earleen Newport, tolerated well. Received Versed 1 mg along with Fentanyl 25 mcg IV for procedure. Vitals stable pre and post procedure. Report given to Fransico Michael Rn post procedure/specials.

## 2020-08-13 ENCOUNTER — Other Ambulatory Visit: Payer: Self-pay | Admitting: Orthopedic Surgery

## 2020-08-13 ENCOUNTER — Other Ambulatory Visit: Payer: Self-pay | Admitting: Anatomic Pathology & Clinical Pathology

## 2020-08-13 LAB — SURGICAL PATHOLOGY

## 2020-08-17 ENCOUNTER — Encounter: Payer: Self-pay | Admitting: Oncology

## 2020-08-17 ENCOUNTER — Encounter: Payer: Self-pay | Admitting: *Deleted

## 2020-08-17 ENCOUNTER — Other Ambulatory Visit: Payer: Self-pay

## 2020-08-17 ENCOUNTER — Other Ambulatory Visit
Admission: RE | Admit: 2020-08-17 | Discharge: 2020-08-17 | Disposition: A | Discharge status: Home / Self Care | Payer: Medicare Other | Source: Ambulatory Visit | Attending: Orthopedic Surgery | Admitting: Orthopedic Surgery

## 2020-08-17 ENCOUNTER — Inpatient Hospital Stay: Payer: Medicare Other | Attending: Oncology | Admitting: Oncology

## 2020-08-17 VITALS — BP 131/75 | HR 102 | Temp 97.4°F | Resp 16 | Wt 143.6 lb

## 2020-08-17 DIAGNOSIS — Z791 Long term (current) use of non-steroidal anti-inflammatories (NSAID): Secondary | ICD-10-CM | POA: Insufficient documentation

## 2020-08-17 DIAGNOSIS — F32A Depression, unspecified: Secondary | ICD-10-CM | POA: Insufficient documentation

## 2020-08-17 DIAGNOSIS — E119 Type 2 diabetes mellitus without complications: Secondary | ICD-10-CM | POA: Diagnosis not present

## 2020-08-17 DIAGNOSIS — M1990 Unspecified osteoarthritis, unspecified site: Secondary | ICD-10-CM | POA: Insufficient documentation

## 2020-08-17 DIAGNOSIS — Z01812 Encounter for preprocedural laboratory examination: Secondary | ICD-10-CM | POA: Diagnosis present

## 2020-08-17 DIAGNOSIS — S22000A Wedge compression fracture of unspecified thoracic vertebra, initial encounter for closed fracture: Secondary | ICD-10-CM

## 2020-08-17 DIAGNOSIS — C3491 Malignant neoplasm of unspecified part of right bronchus or lung: Secondary | ICD-10-CM | POA: Insufficient documentation

## 2020-08-17 DIAGNOSIS — Z7984 Long term (current) use of oral hypoglycemic drugs: Secondary | ICD-10-CM | POA: Diagnosis not present

## 2020-08-17 DIAGNOSIS — Z20822 Contact with and (suspected) exposure to covid-19: Secondary | ICD-10-CM | POA: Insufficient documentation

## 2020-08-17 DIAGNOSIS — I712 Thoracic aortic aneurysm, without rupture: Secondary | ICD-10-CM | POA: Diagnosis not present

## 2020-08-17 DIAGNOSIS — I714 Abdominal aortic aneurysm, without rupture: Secondary | ICD-10-CM | POA: Insufficient documentation

## 2020-08-17 DIAGNOSIS — Z79899 Other long term (current) drug therapy: Secondary | ICD-10-CM | POA: Diagnosis not present

## 2020-08-17 DIAGNOSIS — I10 Essential (primary) hypertension: Secondary | ICD-10-CM | POA: Diagnosis not present

## 2020-08-17 DIAGNOSIS — M4854XD Collapsed vertebra, not elsewhere classified, thoracic region, subsequent encounter for fracture with routine healing: Secondary | ICD-10-CM | POA: Diagnosis not present

## 2020-08-17 DIAGNOSIS — F419 Anxiety disorder, unspecified: Secondary | ICD-10-CM | POA: Insufficient documentation

## 2020-08-17 DIAGNOSIS — F17210 Nicotine dependence, cigarettes, uncomplicated: Secondary | ICD-10-CM | POA: Insufficient documentation

## 2020-08-17 DIAGNOSIS — I719 Aortic aneurysm of unspecified site, without rupture: Secondary | ICD-10-CM

## 2020-08-17 NOTE — Progress Notes (Signed)
  Oncology Nurse Navigator Documentation  Navigator Location: [facility] Onc (08/17/20 1500)   )Navigator Encounter Type: Follow-up Appt (08/17/20 1500)     Confirmed Diagnosis Date: 08/13/20 (08/17/20 1500)               Patient Visit Type: MedOnc (08/17/20 1500) Treatment Phase: Pre-Tx/Tx Discussion (08/17/20 1500) Barriers/Navigation Needs: Coordination of Care;Pain (08/17/20 1500)   Interventions: Coordination of Care;Referrals (08/17/20 1500) Referrals: Radiation Oncology (08/17/20 1500) Coordination of Care: Appts (08/17/20 1500)        Acuity: Level 2-Minimal Needs (1-2 Barriers Identified) (08/17/20 1500)    met with patient during follow up visit with Dr. Tasia Catchings to discuss biopsy results and next steps. All questions answered during visit. Pt given resources regarding diagnosis and supportive services available. Reviewed upcoming appts. Instructed to call with any further questions or needs. Pt verbalized understanding. Nothing further needed at this time.     Time Spent with Patient: 45 (08/17/20 1500)

## 2020-08-17 NOTE — Progress Notes (Signed)
Patient has new T-9 fracture and is scheduled for T-9 kyphoplasty with Dr. Rudene Christians on 3/31.

## 2020-08-17 NOTE — Progress Notes (Signed)
Hematology/Oncology Consult note [facility] Telephone:([phone] [phone]   Patient Care Team: Juluis Pitch, MD as PCP - General (Family Medicine) Telford Nab, RN as Oncology Nurse Navigator  REFERRING PROVIDER: Juluis Pitch, MD  CHIEF COMPLAINTS/REASON FOR VISIT:  Evaluation of lung nodule  HISTORY OF PRESENTING ILLNESS:   Marissa Fletcher is a  80 y.o.  female with PMH listed below was seen in consultation at the request of  Juluis Pitch, MD  for evaluation of lung nodule  05/10/2020-05/12/2020, [name], indeterminate right hilar and precarinal borderline enlarged lymphadenopathy. Hepatomegaly.  Sigmoid diverticulosis with no diverticulitis.  Descending thoracic aorta aneurysm 4 cm.  Extensive calcified and noncalcified atherosclerotic plaque of the thoracic and abdominal aorta.  Patient was referred to cancer center for evaluation of lung mass. Patient is accompanied by her sister.  Patient is current everyday smoker, 34-pack-year smoking history. Report good appetite.  She denies any significant unintentional weight loss, night sweats, fever or chills. Mild chronic shortness of breath with exertion. Patient lives by herself 07/13/2020, PET scan showed marked hypermetabolic him associated with the right upper lobe nodule compatible with bronchogenic neoplasm.  No adenopathy or distant metastasis disease.  Small nonspecific pulmonary nodules without changes or increased metabolic activity.  Compression fracture of the T10, nonspecific metabolic activity.  Nonspecific diffuse colonic activity.  Thoracic and abdominal aortic aneurysm.  INTERVAL HISTORY Marissa Fletcher is a 80 y.o. female who has above history reviewed by me today presents for follow up visit for  management of stage I right lung cancer Problems and complaints are listed below: 07/30/2020, T10 kyphoplasty.  T10 biopsy negative for malignancy. During the interval, patient has had right lung mass biopsy.  Pathology came back positive for adenocarcinoma. Patient presents to discuss management plan.  She has also had a new T9 fracture and is scheduled for T9 kyphoplasty with Dr. Rudene Christians in 2 days.  Reports back pain.  Review of Systems  Constitutional: Negative for appetite change, chills, fatigue and fever.  HENT:   Negative for hearing loss and voice change.   Eyes: Negative for eye problems.  Respiratory: Negative for chest tightness and cough.   Cardiovascular: Negative for chest pain.  Gastrointestinal: Negative for abdominal distention, abdominal pain and blood in stool.  Endocrine: Negative for hot flashes.  Genitourinary: Negative for difficulty urinating and frequency.   Musculoskeletal: Positive for back pain. Negative for arthralgias.  Skin: Negative for itching and rash.  Neurological: Negative for extremity weakness.  Hematological: Negative for adenopathy.  Psychiatric/Behavioral: Negative for confusion.    MEDICAL HISTORY:  Past Medical History:  Diagnosis Date  . Anxiety   . Arthritis   . Depression   . Diabetes mellitus without complication ([place])   . History of kidney stones   . Hypertension     SURGICAL HISTORY: Past Surgical History:  Procedure Laterality Date  . ABDOMINAL HYSTERECTOMY    . CHOLECYSTECTOMY    . EYE SURGERY Bilateral   . KYPHOPLASTY N/A 07/30/2020   Procedure: T10 Kyphoplasty;  Surgeon: Hessie Knows, MD;  Location: [facility] ORS;  Service: Orthopedics;  Laterality: N/A;  T10    SOCIAL HISTORY: Social History   Socioeconomic History  . Marital status: Single    Spouse name: Not on file  . Number of children: Not on file  .  Years of education: Not on file  . Highest education level: Not on file  Occupational History  . Not on file   Tobacco Use  . Smoking status: Current Every Day Smoker    Packs/day: 0.50    Years: 68.00    Pack years: 34.00    Types: Cigarettes  . Smokeless tobacco: Never Used  Vaping Use  . Vaping Use: Never used  Substance and Sexual Activity  . Alcohol use: Never  . Drug use: Never  . Sexual activity: Not on file  Other Topics Concern  . Not on file  Social History Narrative  . Not on file   Social Determinants of Health   Financial Resource Strain: Not on file  Food Insecurity: Not on file  Transportation Needs: Not on file  Physical Activity: Not on file  Stress: Not on file  Social Connections: Not on file  Intimate Partner Violence: Not on file    FAMILY HISTORY: Family History  Problem Relation Age of Onset  . Dementia Mother   . Congestive Heart Failure Mother   . Bladder Cancer Father   . Heart disease Father   . Breast cancer Neg Hx     ALLERGIES:  is allergic to sulfa antibiotics.  MEDICATIONS:  Current Outpatient Medications  Medication Sig Dispense Refill  . ALPRAZolam (XANAX) 0.25 MG tablet Take 0.25 mg by mouth daily as needed for anxiety.    Marland Kitchen atorvastatin (LIPITOR) 20 MG tablet Take 20 mg by mouth daily.    . diclofenac Sodium (VOLTAREN) 1 % GEL Apply topically.    Marland Kitchen doxepin (SINEQUAN) 25 MG capsule Take 50-75 mg by mouth at bedtime.    . gabapentin (NEURONTIN) 300 MG capsule Take 300 mg by mouth daily.    Marland Kitchen HYDROcodone-acetaminophen (NORCO/VICODIN) 5-325 MG tablet Take 1 tablet by mouth every 4 (four) hours as needed for severe pain. 20 tablet 0  . lidocaine (LIDODERM) 5 % Place 1 patch onto the skin daily. Remove & Discard patch within 12 hours or as directed by MD 30 patch 0  . lisinopril-hydrochlorothiazide (ZESTORETIC) 20-12.5 MG tablet Take 1 tablet by mouth daily.    . metaxalone (SKELAXIN) 400 MG tablet Take by mouth.    . metFORMIN (GLUCOPHAGE-XR) 500 MG 24 hr tablet Take 500 mg by mouth 2 (two) times daily.    Marland Kitchen PARoxetine (PAXIL) 30 MG tablet  Take 30 mg by mouth daily.    . polyethylene glycol (MIRALAX / GLYCOLAX) 17 g packet Take 17 g by mouth daily as needed for moderate constipation. 30 each 0  . nicotine (NICODERM CQ - DOSED IN MG/24 HOURS) 21 mg/24hr patch One 21 mg patch chest wall daily (okay to substitute generic) (Patient not taking: Reported on 08/17/2020) 28 patch 0   No current facility-administered medications for this visit.     PHYSICAL EXAMINATION: ECOG PERFORMANCE STATUS: 1 - Symptomatic but completely ambulatory Vitals:   08/17/20 1419  BP: 131/75  Pulse: (!) 102  Resp: 16  Temp: (!) 97.4 F (36.3 C)   Filed Weights   08/17/20 1419  Weight: 143 lb 9.6 oz (65.1 kg)    Physical Exam Constitutional:      General: She is not in acute distress.    Comments: Elderly female who walks independently.  HENT:     Head: Normocephalic and atraumatic.  Eyes:     General: No scleral icterus. Cardiovascular:     Rate and Rhythm: Normal rate and regular rhythm.  Heart sounds: Normal heart sounds.  Pulmonary:     Effort: Pulmonary effort is normal. No respiratory distress.     Breath sounds: No wheezing.  Abdominal:     General: Bowel sounds are normal. There is no distension.     Palpations: Abdomen is soft.  Musculoskeletal:        General: No deformity. Normal range of motion.     Cervical back: Normal range of motion and neck supple.  Skin:    General: Skin is warm and dry.     Findings: No erythema or rash.  Neurological:     Mental Status: She is alert and oriented to person, place, and time. Mental status is at baseline.     Cranial Nerves: No cranial nerve deficit.     Coordination: Coordination normal.  Psychiatric:        Mood and Affect: Mood normal.     LABORATORY DATA:  I have reviewed the data as listed Lab Results  Component Value Date   WBC 6.8 08/11/2020   HGB 12.5 08/11/2020   HCT 38.9 08/11/2020   MCV 82.6 08/11/2020   PLT 362 08/11/2020   Recent Labs    05/10/20 1537  05/11/20 0422 05/12/20 0444  NA 134* 137 135  K 3.9 3.4* 3.7  CL 98 101 100  CO2 25 26 25   GLUCOSE 117* 111* 116*  BUN 12 10 7*  CREATININE 0.83 0.75 0.61  CALCIUM 8.7* 8.6* 8.7*  GFRNONAA >60 >60 >60  PROT 6.8  --   --   ALBUMIN 3.4*  --   --   AST 25  --   --   ALT 16  --   --   ALKPHOS 125  --   --   BILITOT 0.5  --   --    Iron/TIBC/Ferritin/ %Sat    Component Value Date/Time   IRON 14 (L) 05/10/2020 1537   TIBC 382 05/10/2020 1537   FERRITIN 9 (L) 05/10/2020 1537   IRONPCTSAT 4 (L) 05/10/2020 1537      RADIOGRAPHIC STUDIES: I have personally reviewed the radiological images as listed and agreed with the findings in the report. DG Thoracic Spine 2 View  Result Date: 07/30/2020 CLINICAL DATA:  Kyphoplasty EXAM: DG C-ARM 1-60 MIN; THORACIC SPINE 2 VIEWS FLUOROSCOPY TIME:  Fluoroscopy Time:  30.7 seconds COMPARISON:  MRI July 16, 2020. FINDINGS: Two C-arm fluoroscopic images were obtained intraoperatively and submitted for post operative interpretation. These images demonstrate kyphoplasty at the T10 level without definite extravasation of cement. please see the performing provider's procedural report for further detail. IMPRESSION: T10 kyphoplasty. Electronically Signed   By: Margaretha Sheffield MD   On: 07/30/2020 15:03   CT Biopsy  Result Date: 08/11/2020 INDICATION: 80 year old female with a history of right upper lobe lung nodule FDG avid EXAM: CT BIOPSY MEDICATIONS: None. ANESTHESIA/SEDATION: Moderate (conscious) sedation was employed during this procedure. A total of Versed 1.0 mg and Fentanyl 25 mcg was administered intravenously. Moderate Sedation Time: 16 minutes. The patient's level of consciousness and vital signs were monitored continuously by radiology nursing throughout the procedure under my direct supervision. FLUOROSCOPY TIME:  CT COMPLICATIONS: None PROCEDURE: The procedure, risks, benefits, and alternatives were explained to the patient and the patient's  family. Specific risks that were addressed included bleeding, infection, pneumothorax, need for further procedure including chest tube placement, chance of delayed pneumothorax or hemorrhage, hemoptysis, nondiagnostic sample, cardiopulmonary collapse, death. Questions regarding the procedure were encouraged and answered. The patient understands  and consents to the procedure. Patient was positioned in the supine position on the CT gantry table and a scout CT of the chest was performed for planning purposes. Once angle of approach was determined, the skin and subcutaneous tissues this scan was prepped and draped in the usual sterile fashion, and a sterile drape was applied covering the operative field. A sterile gown and sterile gloves were used for the procedure. Local anesthesia was provided with 1% Lidocaine. The skin and subcutaneous tissues were infiltrated 1% lidocaine for local anesthesia, and a small stab incision was made with an 11 blade scalpel. Using CT guidance, a 17 gauge trocar needle was advanced into the right upper lobetarget. After confirmation of the tip, separate 18 gauge core biopsies were performed. These were placed into solution for transportation to the lab. Biosentry Device was deployed. A final CT image was performed. Patient tolerated the procedure well and remained hemodynamically stable throughout. No complications were encountered and no significant blood loss was encounter IMPRESSION: Status post CT-guided biopsy of right upper lobe lung nodule. Signed, Dulcy Fanny. Dellia Nims, RPVI [facility]iology Electronically Signed   By: Corrie Mckusick D.O.   On: 08/11/2020 11:14   DG Chest Port 1 View  Result Date: 08/11/2020 CLINICAL DATA:  80 year old female with a history of right-sided nodule biopsy EXAM: PORTABLE CHEST 1 VIEW COMPARISON:  08/11/2020, prior chest x-ray 05/10/2020 FINDINGS: Cardiomediastinal silhouette unchanged in size and  contour. Coarsened interstitial markings persist. No pneumothorax on the left to the right. No pleural effusion. The right upper lobe nodule is better demonstrated on the prior CT, present in the right mid lung. IMPRESSION: No complicating features status post right-sided lung nodule biopsy. Electronically Signed   By: Corrie Mckusick D.O.   On: 08/11/2020 11:56   DG C-Arm 1-60 Min  Result Date: 07/30/2020 CLINICAL DATA:  Kyphoplasty EXAM: DG C-ARM 1-60 MIN; THORACIC SPINE 2 VIEWS FLUOROSCOPY TIME:  Fluoroscopy Time:  30.7 seconds COMPARISON:  MRI July 16, 2020. FINDINGS: Two C-arm fluoroscopic images were obtained intraoperatively and submitted for post operative interpretation. These images demonstrate kyphoplasty at the T10 level without definite extravasation of cement. please see the performing provider's procedural report for further detail. IMPRESSION: T10 kyphoplasty. Electronically Signed   By: Margaretha Sheffield MD   On: 07/30/2020 15:03      ASSESSMENT & PLAN:  1. Primary adenocarcinoma of right lung ([place])   2. Compression fracture of body of thoracic vertebra (HCC)   3. Aortic aneurysm without rupture, unspecified portion of aorta (HCC)    #Stage IA right lung adenocarcinoma Image findings and pathology were reviewed and discussed with patient Recommend SBRT.  I will refer patient establish care with radiation oncology. I will defer to Dr. Baruch Gouty for post radiation CT follow-ups.  #Compression fracture.  I suspect that patient has osteoporosis.  I recommend patient to discuss with primary care for further evaluation.  She follows up with orthopedic surgeon for kyphoplasty. #History of microcytic anemia, repeat blood work in March 2022 showed complete normalization of hemoglobin.  I will hold off additional work-up #Aortic aneurysm.  Recommend patient to follow-up with primary care provider and to follow-up. CC Dr. Juluis Pitch.  Orders Placed This Encounter  Procedures  .  Ambulatory referral to Radiation Oncology    Referral Priority:   Routine    Referral Type:   Consultation    Referral Reason:   Specialty Services Required    Requested Specialty:  Radiation Oncology    Number of Visits Requested:   1    All questions were answered. The patient knows to call the clinic with any problems questions or concerns.  cc Juluis Pitch, MD    Return of visit: No need for follow-up with me at this point.  If needed, she can be referred back to reestablish care with me.. Thank you for this kind referral and the opportunity to participate in the care of this patient. A copy of today's note is routed to referring provider    Earlie Server, MD, PhD Hematology Oncology [facility] Pager- [phone] 08/17/2020

## 2020-08-18 ENCOUNTER — Other Ambulatory Visit: Admission: RE | Admit: 2020-08-18 | Payer: Medicare Other | Source: Ambulatory Visit

## 2020-08-18 ENCOUNTER — Encounter
Admission: RE | Admit: 2020-08-18 | Discharge: 2020-08-18 | Disposition: A | Discharge status: Home / Self Care | Payer: Medicare Other | Source: Ambulatory Visit | Attending: Orthopedic Surgery | Admitting: Orthopedic Surgery

## 2020-08-18 ENCOUNTER — Other Ambulatory Visit: Payer: Medicare Other

## 2020-08-18 HISTORY — DX: Malignant (primary) neoplasm, unspecified: C80.1

## 2020-08-18 HISTORY — DX: Thoracic aortic aneurysm, without rupture: I71.2

## 2020-08-18 HISTORY — DX: Thoracic aortic aneurysm, without rupture, unspecified: I71.20

## 2020-08-18 HISTORY — DX: Chronic obstructive pulmonary disease, unspecified: J44.9

## 2020-08-18 LAB — SARS CORONAVIRUS 2 (TAT 6-24 HRS): SARS Coronavirus 2: NEGATIVE

## 2020-08-18 MED ORDER — SODIUM CHLORIDE 0.9 % IV SOLN: INTRAVENOUS | Status: DC

## 2020-08-18 MED ORDER — ORAL CARE MOUTH RINSE: 15.0000 mL | Freq: Once | OROMUCOSAL | Status: AC

## 2020-08-18 MED ORDER — CEFAZOLIN SODIUM-DEXTROSE 2-4 GM/100ML-% IV SOLN
2.0000 g | INTRAVENOUS | Status: AC
  Administered 2020-08-19: 2 g via INTRAVENOUS

## 2020-08-18 MED ORDER — CHLORHEXIDINE GLUCONATE 0.12 % MT SOLN
15.0000 mL | Freq: Once | OROMUCOSAL | Status: AC
  Administered 2020-08-19: 15 mL via OROMUCOSAL

## 2020-08-18 MED ORDER — FAMOTIDINE 20 MG PO TABS
20.0000 mg | ORAL_TABLET | Freq: Once | ORAL | Status: AC
  Administered 2020-08-19: 20 mg via ORAL

## 2020-08-18 NOTE — Patient Instructions (Addendum)
Your procedure is scheduled on:08-19-20 THURSDAY Report to the Registration Desk on the 1st floor of the [facility]. To find out your arrival time, please call [phone] between 1PM - 3PM on:08-18-20 WEDNESDAY  REMEMBER: Instructions that are not followed completely may result in serious medical risk, up to and including death; or upon the discretion of your surgeon and anesthesiologist your surgery may need to be rescheduled.  Do not eat food after midnight the night before surgery.  No gum chewing, lozengers or hard candies.  You may however, drink WATER up to 2 hours before you are scheduled to arrive for your surgery. Do not drink anything within 2 hours of your scheduled arrival time.  Type 1 and Type 2 diabetics should only drink water.  TAKE THESE MEDICATIONS THE MORNING OF SURGERY WITH A SIP OF WATER: -PAXIL (PAROXETINE) -YOU MAY TAKE XANAX IF NEEDED THE MORNING OF SURGERY  Stop Metformin 2 days prior to surgery-LAST DOSE WAS ON 08-18-20 AM DOSE-PT INSTRUCTED TO STOP NOW   One week prior to surgery: Stop Anti-inflammatories (NSAIDS) such as Advil, Aleve, Ibuprofen, Motrin, Naproxen, Naprosyn and Aspirin based products such as Excedrin, Goodys Powder, BC Powder-OK TO TAKE HYDROCODONE/TYLENOL IF NEEDED  No Alcohol for 24 hours before or after surgery.  No Smoking including e-cigarettes for 24 hours prior to surgery.  No chewable tobacco products for at least 6 hours prior to surgery.  No nicotine patches on the day of surgery.  Do not use any "recreational" drugs for at least a week prior to your surgery.  Please be advised that the combination of cocaine and anesthesia may have negative outcomes, up to and including death. If you test positive for cocaine, your surgery will be cancelled.  On the morning of surgery brush your teeth with toothpaste and water, you may rinse your mouth with mouthwash if you wish. Do not swallow any toothpaste or mouthwash.  Do not wear  jewelry, make-up, hairpins, clips or nail polish.  Do not wear lotions, powders, or perfumes.   Do not shave body from the neck down 48 hours prior to surgery just in case you cut yourself which could leave a site for infection.  Also, freshly shaved skin may become irritated if using the CHG soap.  Contact lenses, hearing aids and dentures may not be worn into surgery.  Do not bring valuables to the hospital. [facility] is not responsible for any missing/lost belongings or valuables.   Notify your doctor if there is any change in your medical condition (cold, fever, infection).  Wear comfortable clothing (specific to your surgery type) to the hospital.  Plan for stool softeners for home use; pain medications have a tendency to cause constipation. You can also help prevent constipation by eating foods high in fiber such as fruits and vegetables and drinking plenty of fluids as your diet allows.  After surgery, you can help prevent lung complications by doing breathing exercises.  Take deep breaths and cough every 1-2 hours. Your doctor may order a device called an Incentive Spirometer to help you take deep breaths. When coughing or sneezing, hold a pillow firmly against your incision with both hands. This is called "splinting." Doing this helps protect your incision. It also decreases belly discomfort.  If you are being admitted to the hospital overnight, leave your suitcase in the car. After surgery it may be brought to your room.  If you are being discharged the day of surgery, you will not be allowed to  drive home. You will need a responsible adult (18 years or older) to drive you home and stay with you that night.   If you are taking public transportation, you will need to have a responsible adult (18 years or older) with you. Please confirm with your physician that it is acceptable to use public transportation.   Please call the [place] Dept. at [phone] if  you have any questions about these instructions.  Surgery Visitation Policy:  Patients undergoing a surgery or procedure may have one family member or support person with them as long as that person is not COVID-19 positive or experiencing its symptoms.  That person may remain in the waiting area during the procedure.  Inpatient Visitation:    Visiting hours are 7 a.m. to 8 p.m. Inpatients will be allowed two visitors daily. The visitors may change each day during the patient's stay. No visitors under the age of 66. Any visitor under the age of 29 must be accompanied by an adult. The visitor must pass COVID-19 screenings, use hand sanitizer when entering and exiting the patient's room and wear a mask at all times, including in the patient's room. Patients must also wear a mask when staff or their visitor are in the room. Masking is required regardless of vaccination status.

## 2020-08-18 NOTE — Anesthesia Preprocedure Evaluation (Deleted)
Anesthesia Evaluation    Airway        Dental   Pulmonary Current Smoker,           Cardiovascular hypertension,      Neuro/Psych    GI/Hepatic   Endo/Other  diabetes  Renal/GU      Musculoskeletal   Abdominal   Peds  Hematology   Anesthesia Other Findings Past Medical History: No date: Anxiety No date: Arthritis No date: Cancer ([place]) No date: COPD (chronic obstructive pulmonary disease) (HCC) No date: Depression No date: Diabetes mellitus without complication (HCC) No date: History of kidney stones No date: Hypertension No date: Thoracic aortic aneurysm [facility])   Reproductive/Obstetrics                                                              Anesthesia Evaluation  Patient identified by MRN, date of birth, ID band Patient awake    Reviewed: Allergy & Precautions, NPO status , Patient's Chart, lab work & pertinent test results  History of Anesthesia Complications Negative for: history of anesthetic complications  Airway Mallampati: II  TM Distance: >3 FB Neck ROM: Full    Dental  (+) Edentulous Upper, Poor Dentition, Missing   Pulmonary neg sleep apnea, neg COPD, Current SmokerPatient did not abstain from smoking.,    breath sounds clear to auscultation- rhonchi (-) wheezing      Cardiovascular hypertension, Pt. on medications (-) CAD, (-) Past MI, (-) Cardiac Stents and (-) CABG  Rhythm:Regular Rate:Normal - Systolic murmurs and - Diastolic murmurs    Neuro/Psych neg Seizures PSYCHIATRIC DISORDERS Anxiety Depression negative neurological ROS     GI/Hepatic negative GI ROS, Neg liver ROS,   Endo/Other  diabetes, Oral Hypoglycemic Agents  Renal/GU negative Renal ROS     Musculoskeletal  (+) Arthritis ,   Abdominal (+) - obese,   Peds  Hematology  (+) anemia ,   Anesthesia Other Findings Past Medical History: No date: Anxiety No date:  Arthritis No date: Depression No date: Diabetes mellitus without complication (HCC) No date: History of kidney stones No date: Hypertension   Reproductive/Obstetrics                             Anesthesia Physical Anesthesia Plan  ASA: III  Anesthesia Plan: General   Post-op Pain Management:    Induction: Intravenous  PONV Risk Score and Plan: 1 and Propofol infusion  Airway Management Planned: Natural Airway  Additional Equipment:   Intra-op Plan:   Post-operative Plan:   Informed Consent: I have reviewed the patients History and Physical, chart, labs and discussed the procedure including the risks, benefits and alternatives for the proposed anesthesia with the patient or authorized representative who has indicated his/her understanding and acceptance.     Dental advisory given  Plan Discussed with: CRNA and Anesthesiologist  Anesthesia Plan Comments:         Anesthesia Quick Evaluation  Anesthesia Physical Anesthesia Plan Anesthesia Quick Evaluation

## 2020-08-19 ENCOUNTER — Ambulatory Visit: Payer: Medicare Other | Admitting: Urgent Care

## 2020-08-19 ENCOUNTER — Other Ambulatory Visit: Payer: Medicare Other

## 2020-08-19 ENCOUNTER — Other Ambulatory Visit: Payer: Self-pay

## 2020-08-19 ENCOUNTER — Encounter
Admission: RE | Disposition: A | Discharge status: Home / Self Care | Payer: Self-pay | Source: Home / Self Care | Attending: Orthopedic Surgery

## 2020-08-19 ENCOUNTER — Encounter: Payer: Self-pay | Admitting: Orthopedic Surgery

## 2020-08-19 ENCOUNTER — Ambulatory Visit: Payer: Medicare Other

## 2020-08-19 ENCOUNTER — Ambulatory Visit
Admission: RE | Admit: 2020-08-19 | Discharge: 2020-08-19 | Disposition: A | Discharge status: Home / Self Care | Payer: Medicare Other | Attending: Orthopedic Surgery | Admitting: Orthopedic Surgery

## 2020-08-19 DIAGNOSIS — Z7984 Long term (current) use of oral hypoglycemic drugs: Secondary | ICD-10-CM | POA: Diagnosis not present

## 2020-08-19 DIAGNOSIS — Z419 Encounter for procedure for purposes other than remedying health state, unspecified: Secondary | ICD-10-CM

## 2020-08-19 DIAGNOSIS — Z7982 Long term (current) use of aspirin: Secondary | ICD-10-CM | POA: Insufficient documentation

## 2020-08-19 DIAGNOSIS — Z9889 Other specified postprocedural states: Secondary | ICD-10-CM | POA: Insufficient documentation

## 2020-08-19 DIAGNOSIS — Z79899 Other long term (current) drug therapy: Secondary | ICD-10-CM | POA: Insufficient documentation

## 2020-08-19 DIAGNOSIS — M8008XA Age-related osteoporosis with current pathological fracture, vertebra(e), initial encounter for fracture: Secondary | ICD-10-CM | POA: Diagnosis not present

## 2020-08-19 DIAGNOSIS — Z882 Allergy status to sulfonamides status: Secondary | ICD-10-CM | POA: Insufficient documentation

## 2020-08-19 DIAGNOSIS — M4854XA Collapsed vertebra, not elsewhere classified, thoracic region, initial encounter for fracture: Secondary | ICD-10-CM | POA: Insufficient documentation

## 2020-08-19 HISTORY — PX: KYPHOPLASTY: SHX5884

## 2020-08-19 LAB — GLUCOSE, CAPILLARY
Glucose-Capillary: 157 mg/dL — ABNORMAL HIGH (ref 70–99)
Glucose-Capillary: 134 mg/dL — ABNORMAL HIGH (ref 70–99)

## 2020-08-19 IMAGING — XA DG C-ARM 1-60 MIN
1 series · 1 of 1 positions shown · non-contrast
Comparison: None.

CLINICAL DATA: Elective surgery.

EXAM:
THORACIC SPINE 2 VIEWS; DG C-ARM 1-60 MIN

[Series 1: cont. · 1 of 1 slices shown]
[im 1/1]
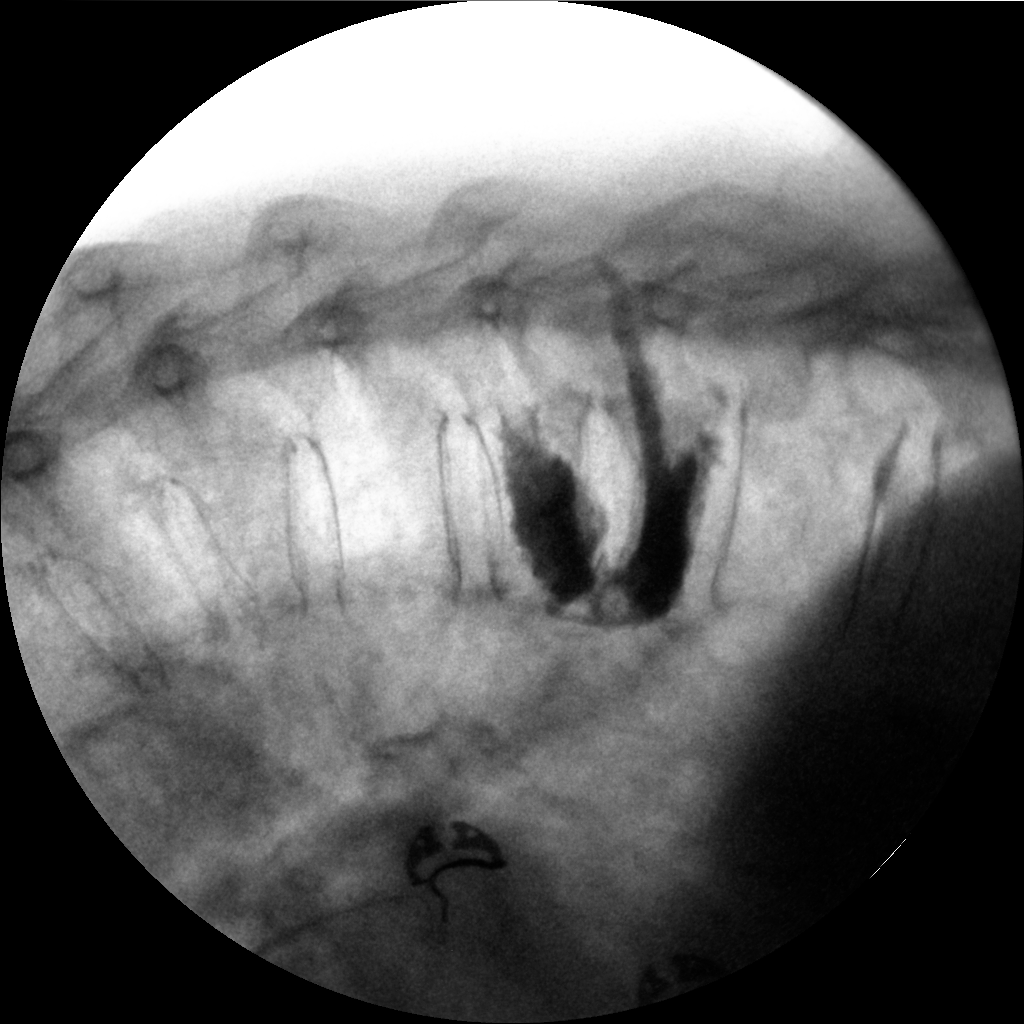

[1 of 1 positions shown; findings below may reference images not displayed]

FINDINGS: Two fluoroscopic spot images obtained in the operating room in
frontal and lateral projections. There is kyphoplasty material
within 2 contiguous thoracic vertebra, levels not determined on
these coned views. Fluoroscopy time 12 seconds.
IMPRESSION: Fluoroscopic spot images after kyphoplasty of 2 contiguous thoracic
vertebra.

## 2020-08-19 IMAGING — XA DG THORACIC SPINE 2V
3 series · 3 of 3 positions shown · non-contrast
Comparison: None.

CLINICAL DATA: Elective surgery.

EXAM:
THORACIC SPINE 2 VIEWS; DG C-ARM 1-60 MIN

[Series 1: cont. · 1 of 1 slices shown]
[im 1/1]
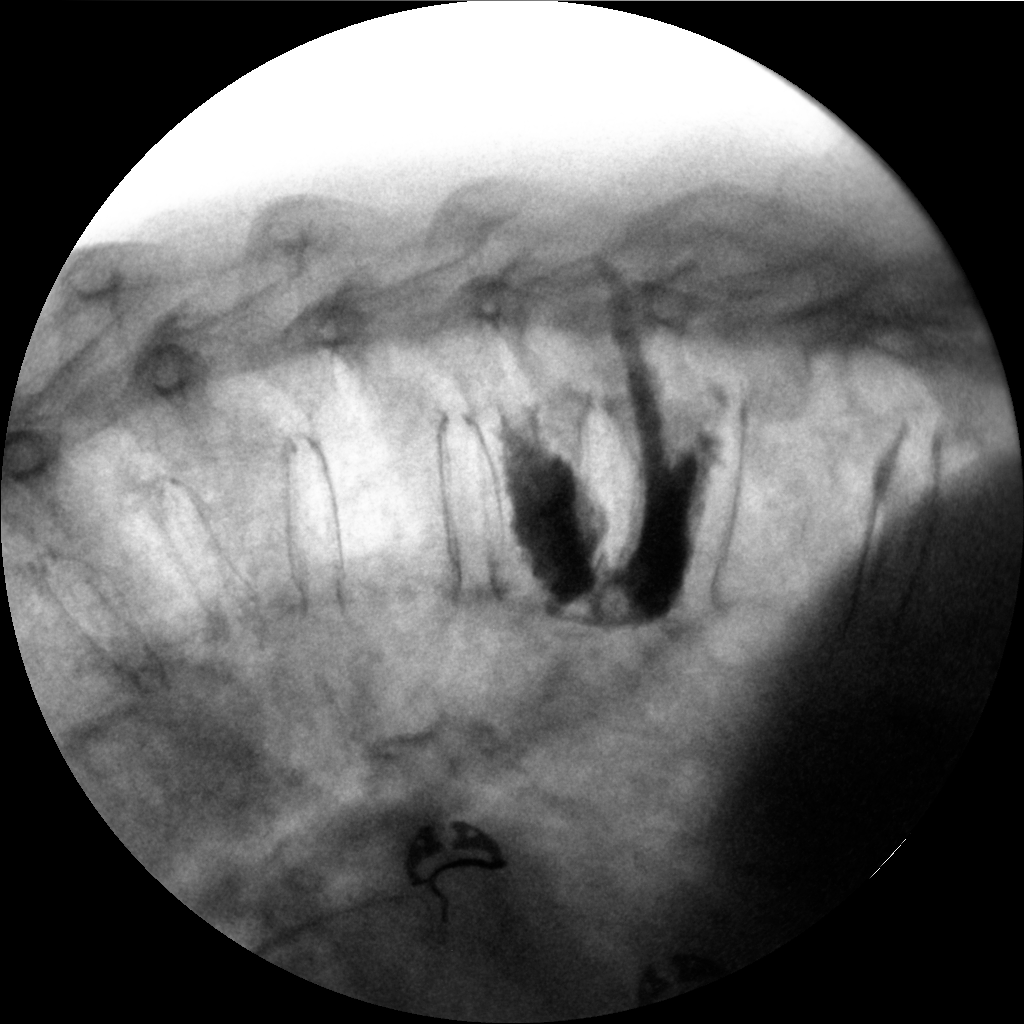

[Series 1: ortho standard · 1 of 1 slices shown (1 of 2)]
[im 1/1]
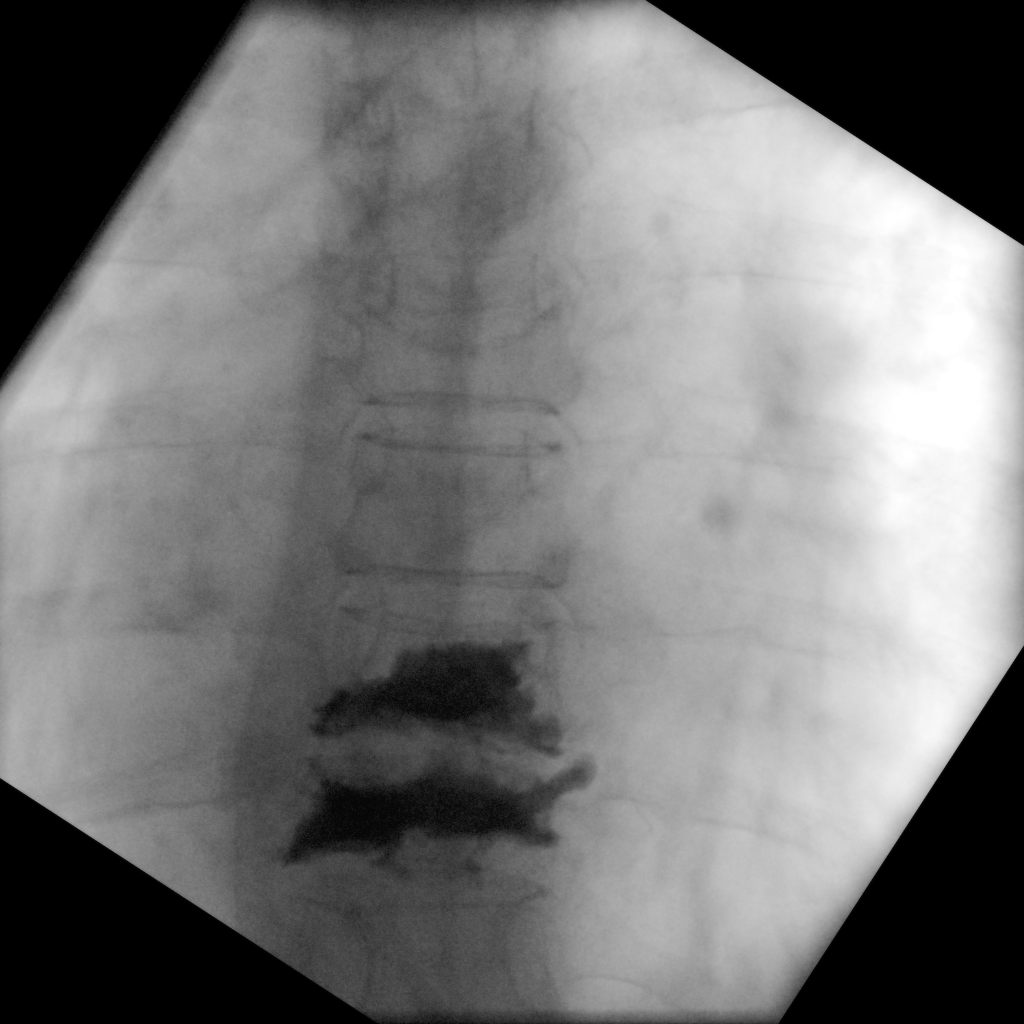

[Series 1: ortho standard · 1 of 1 slices shown (2 of 2)]
[im 1/1]
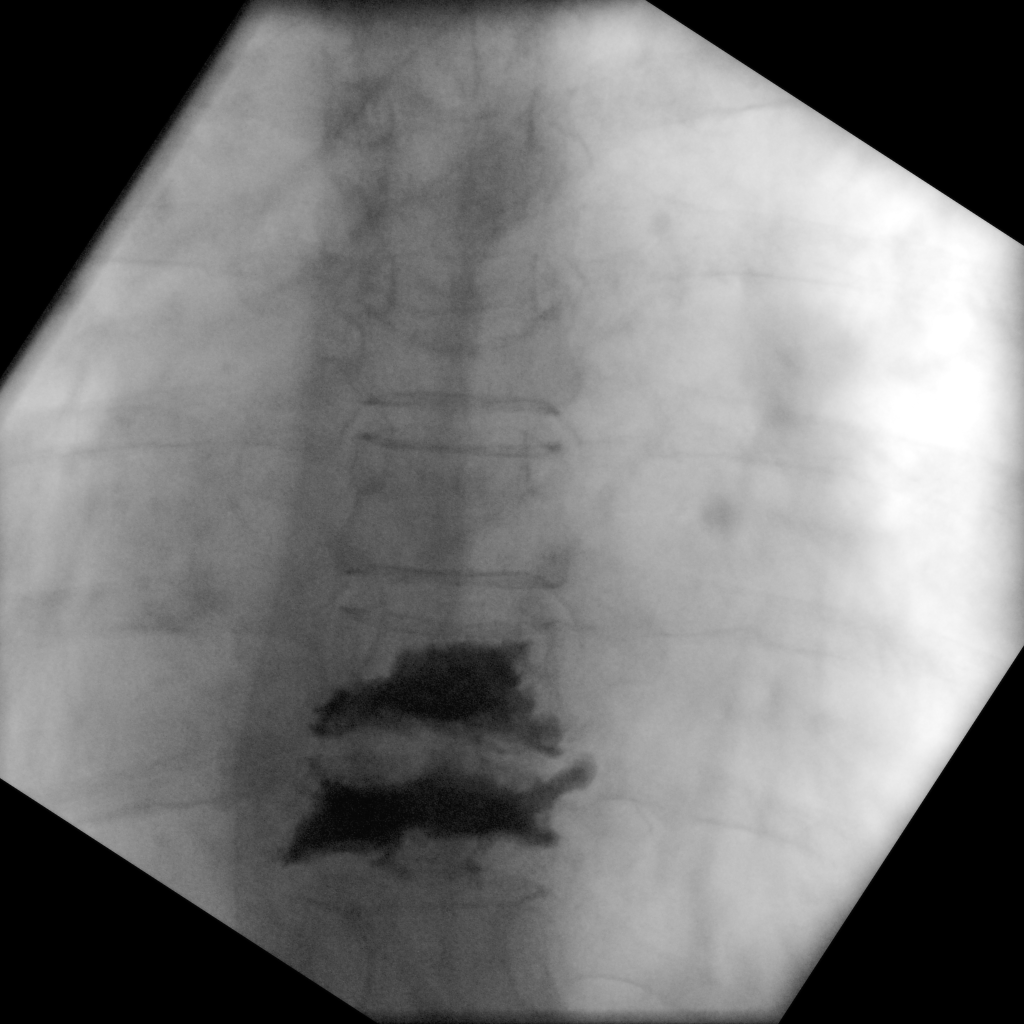

[3 of 3 positions shown; findings below may reference images not displayed]

FINDINGS: Two fluoroscopic spot images obtained in the operating room in
frontal and lateral projections. There is kyphoplasty material
within 2 contiguous thoracic vertebra, levels not determined on
these coned views. Fluoroscopy time 12 seconds.
IMPRESSION: Fluoroscopic spot images after kyphoplasty of 2 contiguous thoracic
vertebra.

## 2020-08-19 SURGERY — KYPHOPLASTY
Anesthesia: General | Site: Spine Thoracic

## 2020-08-19 MED ORDER — GLYCOPYRROLATE 0.2 MG/ML IJ SOLN
INTRAMUSCULAR | Status: AC
  Filled 2020-08-19: qty 1

## 2020-08-19 MED ORDER — LIDOCAINE HCL (PF) 1 % IJ SOLN
INTRAMUSCULAR | Status: AC
  Filled 2020-08-19: qty 30

## 2020-08-19 MED ORDER — IOHEXOL 180 MG/ML  SOLN
INTRAMUSCULAR | Status: DC | PRN
  Administered 2020-08-19: 60 mL

## 2020-08-19 MED ORDER — ACETAMINOPHEN 10 MG/ML IV SOLN: 1000.0000 mg | Freq: Once | INTRAVENOUS | Status: DC | PRN

## 2020-08-19 MED ORDER — LIDOCAINE HCL 1 % IJ SOLN
INTRAMUSCULAR | Status: DC | PRN
  Administered 2020-08-19: 10 mL

## 2020-08-19 MED ORDER — BUPIVACAINE-EPINEPHRINE (PF) 0.5% -1:200000 IJ SOLN
INTRAMUSCULAR | Status: AC
  Filled 2020-08-19: qty 30

## 2020-08-19 MED ORDER — PROPOFOL 10 MG/ML IV BOLUS
INTRAVENOUS | Status: AC
  Filled 2020-08-19: qty 20

## 2020-08-19 MED ORDER — DEXMEDETOMIDINE (PRECEDEX) IN NS 20 MCG/5ML (4 MCG/ML) IV SYRINGE
PREFILLED_SYRINGE | INTRAVENOUS | Status: AC
  Filled 2020-08-19: qty 5

## 2020-08-19 MED ORDER — MIDAZOLAM HCL 2 MG/2ML IJ SOLN
INTRAMUSCULAR | Status: AC
  Filled 2020-08-19: qty 2

## 2020-08-19 MED ORDER — FENTANYL CITRATE (PF) 100 MCG/2ML IJ SOLN
INTRAMUSCULAR | Status: AC
  Filled 2020-08-19: qty 2

## 2020-08-19 MED ORDER — FAMOTIDINE 20 MG PO TABS
ORAL_TABLET | ORAL | Status: AC
  Filled 2020-08-19: qty 1

## 2020-08-19 MED ORDER — LACTATED RINGERS IV SOLN: INTRAVENOUS | Status: DC | PRN

## 2020-08-19 MED ORDER — CHLORHEXIDINE GLUCONATE 0.12 % MT SOLN
OROMUCOSAL | Status: AC
  Filled 2020-08-19: qty 15

## 2020-08-19 MED ORDER — OXYCODONE HCL 5 MG/5ML PO SOLN: 5.0000 mg | Freq: Once | ORAL | Status: AC | PRN

## 2020-08-19 MED ORDER — FENTANYL CITRATE (PF) 100 MCG/2ML IJ SOLN: 25.0000 ug | INTRAMUSCULAR | Status: DC | PRN

## 2020-08-19 MED ORDER — OXYCODONE HCL 5 MG PO TABS
5.0000 mg | ORAL_TABLET | Freq: Once | ORAL | Status: AC | PRN
  Administered 2020-08-19: 5 mg via ORAL

## 2020-08-19 MED ORDER — CEFAZOLIN SODIUM-DEXTROSE 2-4 GM/100ML-% IV SOLN
INTRAVENOUS | Status: AC
  Filled 2020-08-19: qty 100

## 2020-08-19 MED ORDER — DEXMEDETOMIDINE (PRECEDEX) IN NS 20 MCG/5ML (4 MCG/ML) IV SYRINGE
PREFILLED_SYRINGE | INTRAVENOUS | Status: DC | PRN
  Administered 2020-08-19: 4 ug via INTRAVENOUS
  Administered 2020-08-19: 8 ug via INTRAVENOUS

## 2020-08-19 MED ORDER — KETAMINE HCL 10 MG/ML IJ SOLN
INTRAMUSCULAR | Status: DC | PRN
  Administered 2020-08-19 (×3): 10 mg via INTRAVENOUS

## 2020-08-19 MED ORDER — KETAMINE HCL 50 MG/5ML IJ SOSY
PREFILLED_SYRINGE | INTRAMUSCULAR | Status: AC
  Filled 2020-08-19: qty 5

## 2020-08-19 MED ORDER — ONDANSETRON HCL 4 MG/2ML IJ SOLN: 4.0000 mg | Freq: Once | INTRAMUSCULAR | Status: DC | PRN

## 2020-08-19 MED ORDER — OXYCODONE HCL 5 MG PO TABS
ORAL_TABLET | ORAL | Status: AC
  Filled 2020-08-19: qty 1

## 2020-08-19 MED ORDER — FENTANYL CITRATE (PF) 100 MCG/2ML IJ SOLN
INTRAMUSCULAR | Status: DC | PRN
  Administered 2020-08-19: 25 ug via INTRAVENOUS

## 2020-08-19 MED ORDER — GLYCOPYRROLATE 0.2 MG/ML IJ SOLN
INTRAMUSCULAR | Status: DC | PRN
  Administered 2020-08-19: .2 mg via INTRAVENOUS

## 2020-08-19 MED ORDER — MIDAZOLAM HCL 2 MG/2ML IJ SOLN
INTRAMUSCULAR | Status: DC | PRN
  Administered 2020-08-19 (×2): 1 mg via INTRAVENOUS

## 2020-08-19 MED ORDER — BUPIVACAINE-EPINEPHRINE (PF) 0.5% -1:200000 IJ SOLN
INTRAMUSCULAR | Status: DC | PRN
  Administered 2020-08-19: 10 mL

## 2020-08-19 SURGICAL SUPPLY — 23 items
ADH SKN CLS APL DERMABOND .7 (GAUZE/BANDAGES/DRESSINGS) ×1
CEMENT KYPHON CX01A KIT/MIXER (Cement) ×2 IMPLANT
COVER WAND RF STERILE (DRAPES) ×2 IMPLANT
DERMABOND ADVANCED (GAUZE/BANDAGES/DRESSINGS) ×1
DERMABOND ADVANCED .7 DNX12 (GAUZE/BANDAGES/DRESSINGS) ×1 IMPLANT
DEVICE BIOPSY BONE KYPHX (INSTRUMENTS) ×2 IMPLANT
DRAPE C-ARM XRAY 36X54 (DRAPES) ×2 IMPLANT
DURAPREP 26ML APPLICATOR (WOUND CARE) ×2 IMPLANT
FEE RENTAL RFA GENERATOR (MISCELLANEOUS) IMPLANT
GLOVE SURG SYN 9.0  PF PI (GLOVE) ×1
GLOVE SURG SYN 9.0 PF PI (GLOVE) ×1 IMPLANT
GOWN SRG 2XL LVL 4 RGLN SLV (GOWNS) ×1 IMPLANT
GOWN STRL NON-REIN 2XL LVL4 (GOWNS) ×2
GOWN STRL REUS W/ TWL LRG LVL3 (GOWN DISPOSABLE) ×1 IMPLANT
GOWN STRL REUS W/TWL LRG LVL3 (GOWN DISPOSABLE) ×2
MANIFOLD NEPTUNE II (INSTRUMENTS) ×2 IMPLANT
PACK KYPHOPLASTY (MISCELLANEOUS) ×2 IMPLANT
RENTAL RFA GENERATOR (MISCELLANEOUS) IMPLANT
STRAP SAFETY 5IN WIDE (MISCELLANEOUS) ×2 IMPLANT
SWABSTK COMLB BENZOIN TINCTURE (MISCELLANEOUS) ×2 IMPLANT
TRAY KYPHOPAK 15/2 EXPRESS (KITS) ×1 IMPLANT
TRAY KYPHOPAK 15/3 EXPRESS 1ST (MISCELLANEOUS) ×2 IMPLANT
TRAY KYPHOPAK 20/3 EXPRESS 1ST (MISCELLANEOUS) ×1 IMPLANT

## 2020-08-19 NOTE — H&P (Signed)
Chief Complaint: Chief Complaint  Patient presents with  . Back Pain   Marissa Fletcher is a 80 y.o. female who presents today status post T10 kyphoplasty by Dr. Hessie Knows on 07/30/2020. Patient states for a few days following the T10 kyphoplasty she felt better with improved mobility but pain has increased and she is now having severe debilitating pain. She is having to resort to narcotics to help with back pain. Back pain slightly higher than her previous compression fracture. She is ambulating but not as well as she was for the first few days following the procedure. She denies any numbness tingling or radicular symptoms. She describes a lot of muscle soreness. No fevers warmth erythema or drainage.  Past Medical History: Past Medical History:  Diagnosis Date  . Anxiety  . Depression  . Hypertension   Past Surgical History: Past Surgical History:  Procedure Laterality Date  . CHOLECYSTECTOMY  . HYSTERECTOMY   Past Family History: Family History  Problem Relation Age of Onset  . Dementia Mother  . No Known Problems Father   Medications: Current Outpatient Medications Ordered in Epic  Medication Sig Dispense Refill  . ALPRAZolam (XANAX) 0.25 MG tablet Take 1 tablet (0.25 mg total) by mouth once daily as needed for Anxiety 15 tablet 0  . aspirin 325 MG tablet Take 325 mg by mouth once daily.  Marland Kitchen atorvastatin (LIPITOR) 20 MG tablet Take 1 tablet (20 mg total) by mouth once daily 90 tablet 1  . calcium citrate-vitamin D3 (CITRACAL+D) 315-200 mg-unit tablet Take 2 tablets by mouth 2 (two) times daily with meals.  . cholecalciferol (VITAMIN D3) 1,000 unit capsule Take 1,000 Units by mouth once daily.  . cranberry extract 650 mg Cap Take 42,000 mg by mouth 2 (two) times daily.  . cyanocobalamin (VITAMIN B12) 1000 MCG tablet Take 5,000 mcg by mouth once daily.  . diclofenac (VOLTAREN) 1 % topical gel Apply 2 g topically 4 (four) times daily as needed 100 g 0  . doxepin (SINEQUAN) 25  MG capsule TAKE 2 TO 3 CAPSULES BY MOUTH EVERY NIGHT 270 capsule 0  . gabapentin (NEURONTIN) 300 MG capsule TAKE 1 CAPSULE(300 MG) BY MOUTH EVERY NIGHT 90 capsule 1  . HYDROcodone-acetaminophen (NORCO) 5-325 mg tablet Take 1 tablet by mouth every 6 (six) hours as needed  . lactulose (ENULOSE) 10 gram/15 mL oral solution Take 30 mLs by mouth 2 (two) times daily as needed for Constipation for up to 10 days 473 mL 0  . lisinopriL-hydrochlorothiazide (ZESTORETIC) 20-12.5 mg tablet TAKE 1 TABLET BY MOUTH EVERY DAY 90 tablet 3  . metaxalone (SKELAXIN) 400 mg tablet Take 1 tablet (400 mg total) by mouth 3 (three) times daily as needed . Should last at least 1 month or longer 25 tablet 0  . metFORMIN (GLUCOPHAGE-XR) 500 MG XR tablet TAKE 1 TABLET(500 MG) BY MOUTH TWICE DAILY 180 tablet 1  . MULTIVIT-MIN/FOLIC ACID/VIT K1 (TRUEPLUS DIABETIC MULTIVITAMIN ORAL) Take by mouth.  Marland Kitchen PARoxetine (PAXIL) 30 MG tablet TAKE 1 TABLET(30 MG) BY MOUTH EVERY DAY 90 tablet 1   No current Epic-ordered facility-administered medications on file.   Allergies: Allergies  Allergen Reactions  . Sulfa (Sulfonamide Antibiotics) Swelling    Review of Systems:  A comprehensive 14 point ROS was performed, reviewed by me today, and the pertinent orthopaedic findings are documented in the HPI.  Exam: BP (!) 140/84  Wt 65.3 kg (144 lb)  BMI 24.72 kg/m  General:  Well developed, well nourished, no apparent  distress, normal affect, antalgic gait  HEENT: Head normocephalic, atraumatic, PERRL.   Abdomen: Soft, non tender, non distended, Bowel sounds present.  Heart: Examination of the heart reveals regular, rate, and rhythm. There is no murmur noted on ascultation. There is a normal apical pulse.  Lungs: Lungs are clear to auscultation. There is no wheeze, rhonchi, or crackles. There is normal expansion of bilateral chest walls.   Thoracic spine: Examination of the thoracic spine show some spinous process tenderness  with percussion just above previous incision site. Incision sites are healed, Dermabond has been removed. There is no surrounding erythema warmth or drainage. Nontender along the iliac crest, sacrum or SI joints. Nontender along the lumbar spinous process. No muscle spasms noted. Neuro vas intact in bilateral lower extremities.  AP and lateral views of the thoracic spine are ordered interpreted by me in the office today. Impression: Patient has methylmethacrylate present along the T10 vertebral body. There is good placement of the vertebral segment. There is an acute T9 compression fracture with at least 50% loss of vertebral body height. This is new compared to preoperative and postoperative thoracic x-rays following T10 compression fracture. There is a chronic stable T7 and T6 compression fracture. She has slight kyphosis.  Impression: Status post kyphoplasty [Z98.890] Status post kyphoplasty T10 (primary encounter diagnosis) Closed wedge compression fracture of T9 vertebra, initial encounter (CMS-HCC)  Plan:  9. 80 year old female who is doing well after T10 kyphoplasty. Pain has returned, slightly higher, pain is severe and debilitating. She is taking Norco with little relief. X-rays today show acute T9 compression fracture. Risks, benefits, complications of T9 kyphoplasty have been discussed with the patient. Patient has agreed and consented procedure with Dr. Hessie Knows. She will continue with Norco for pain. Discussed activity modifications. 2. Patient with significant osteoporosis, will place referral to endocrinology  This note was generated in part with voice recognition software and I apologize for any typographical errors that were not detected and corrected.  Feliberto Gottron MPA-C    Electronically signed by Feliberto Gottron, [place] at 08/16/2020 8:23 AM EDT  Reviewed  H+P. No changes noted.

## 2020-08-19 NOTE — Progress Notes (Signed)
Tumor Board Documentation  Marissa Fletcher was presented by Dr Tasia Catchings at our Tumor Board on 08/19/2020, which included representatives from medical oncology,radiation oncology,internal medicine,navigation,pathology,radiology,surgical,genetics,research,palliative care,pulmonology.  [name] currently presents as a new patient,for MDC,for new positive pathology with history of the following treatments: surgical intervention(s).  Additionally, we reviewed previous medical and familial history, history of present illness, and recent lab results along with all available histopathologic and imaging studies. The tumor board considered available treatment options and made the following recommendations: Surgery,Radiation therapy (primary modality)    The following procedures/referrals were also placed: No orders of the defined types were placed in this encounter.   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  Tumor board is a meeting of clinicians from various specialty areas who evaluate and discuss patients for whom a multidisciplinary approach is being considered. Final determinations in the plan of care are those of the provider(s). The responsibility for follow up of recommendations given during tumor board is that of the provider.   Today's extended care, comprehensive team conference, Camika was not present for the discussion and was not examined.   Multidisciplinary Tumor Board is a multidisciplinary case peer review process.  Decisions discussed in the Multidisciplinary Tumor Board reflect the opinions of the specialists present at the conference without having examined the patient.  Ultimately, treatment and diagnostic decisions rest with the primary provider(s) and the patient.

## 2020-08-19 NOTE — Anesthesia Preprocedure Evaluation (Signed)
Anesthesia Evaluation  Patient identified by MRN, date of birth, ID band Patient awake    Reviewed: Allergy & Precautions, NPO status , Patient's Chart, lab work & pertinent test results  History of Anesthesia Complications Negative for: history of anesthetic complications  Airway Mallampati: II  TM Distance: >3 FB Neck ROM: Full    Dental  (+) Edentulous Upper, Poor Dentition, Missing   Pulmonary shortness of breath and with exertion, neg sleep apnea, neg COPD, Current Smoker and Patient abstained from smoking.,    breath sounds clear to auscultation- rhonchi (-) wheezing      Cardiovascular hypertension, Pt. on medications (-) CAD, (-) Past MI, (-) Cardiac Stents and (-) CABG  Rhythm:Regular Rate:Normal - Systolic murmurs and - Diastolic murmurs    Neuro/Psych neg Seizures PSYCHIATRIC DISORDERS Anxiety Depression negative neurological ROS     GI/Hepatic negative GI ROS, Neg liver ROS,   Endo/Other  diabetes, Oral Hypoglycemic Agents  Renal/GU negative Renal ROS     Musculoskeletal  (+) Arthritis ,   Abdominal (+) - obese,   Peds  Hematology  (+) anemia ,   Anesthesia Other Findings Past Medical History: No date: Anxiety No date: Arthritis No date: Depression No date: Diabetes mellitus without complication (HCC) No date: History of kidney stones No date: Hypertension   Reproductive/Obstetrics                             Anesthesia Physical  Anesthesia Plan  ASA: III  Anesthesia Plan: General   Post-op Pain Management:    Induction: Intravenous  PONV Risk Score and Plan: 2 and Propofol infusion  Airway Management Planned: Natural Airway  Additional Equipment: None  Intra-op Plan:   Post-operative Plan:   Informed Consent: I have reviewed the patients History and Physical, chart, labs and discussed the procedure including the risks, benefits and alternatives for the  proposed anesthesia with the patient or authorized representative who has indicated his/her understanding and acceptance.     Dental advisory given  Plan Discussed with: CRNA and Anesthesiologist  Anesthesia Plan Comments: (Discussed risks of anesthesia with patient, including possibility of difficulty with spontaneous ventilation under anesthesia necessitating airway intervention, PONV, and rare risks such as cardiac or respiratory or neurological events. Patient understands. Patient counseled on benefits of smoking cessation, and increased perioperative risks associated with continued smoking. )        Anesthesia Quick Evaluation

## 2020-08-19 NOTE — Transfer of Care (Signed)
Immediate Anesthesia Transfer of Care Note  Patient: Marissa Fletcher  Procedure(s) Performed: T9 YPHOPLASTY (N/A Spine Thoracic)  Patient Location: PACU  Anesthesia Type:General  Level of Consciousness: drowsy  Airway & Oxygen Therapy: Patient Spontanous Breathing and Patient connected to nasal cannula oxygen  Post-op Assessment: Report given to RN and Post -op Vital signs reviewed and stable  Post vital signs: Reviewed and stable  Last Vitals:  Vitals Value Taken Time  BP 115/70 08/19/20 1549  Temp 36.4 C 08/19/20 1549  Pulse 68 08/19/20 1553  Resp 22 08/19/20 1553  SpO2 97 % 08/19/20 1553  Vitals shown include unvalidated device data.  Last Pain:  Vitals:   08/19/20 1404  TempSrc: Oral  PainSc: 8          Complications: No complications documented.

## 2020-08-19 NOTE — Anesthesia Postprocedure Evaluation (Signed)
Anesthesia Post Note  Patient: Marissa Fletcher  Procedure(s) Performed: T9 YPHOPLASTY (N/A Spine Thoracic)  Patient location during evaluation: PACU Anesthesia Type: General Level of consciousness: awake and alert Pain management: pain level controlled Vital Signs Assessment: post-procedure vital signs reviewed and stable Respiratory status: spontaneous breathing and respiratory function stable Cardiovascular status: stable Anesthetic complications: no   No complications documented.   Last Vitals:  Vitals:   08/19/20 1619 08/19/20 1628  BP:  134/75  Pulse: (!) 102 (!) 109  Resp: 20 20  Temp: (!) 36.4 C (!) 36.3 C  SpO2: 93% 92%    Last Pain:  Vitals:   08/19/20 1651  TempSrc:   PainSc: 3                  Emelee Rodocker K

## 2020-08-19 NOTE — Addendum Note (Signed)
Addendum  created 08/19/20 1721 by Gunnar Fusi, MD   Attestation recorded in Intraprocedure, Intraprocedure Attestations deleted, [place] filed

## 2020-08-19 NOTE — Discharge Instructions (Addendum)
Take it easy today and try to rest. Tomorrow try to start walking is much as you can but avoid bending and lifting. Pain medicine as previously described. Call office if you are having problems.   AMBULATORY SURGERY  DISCHARGE INSTRUCTIONS   1) The drugs that you were given will stay in your system until tomorrow so for the next 24 hours you should not:  A) Drive an automobile B) Make any legal decisions C) Drink any alcoholic beverage   2) You may resume regular meals tomorrow.  Today it is better to start with liquids and gradually work up to solid foods.  You may eat anything you prefer, but it is better to start with liquids, then soup and crackers, and gradually work up to solid foods.   3) Please notify your doctor immediately if you have any unusual bleeding, trouble breathing, redness and pain at the surgery site, drainage, fever, or pain not relieved by medication.  4) Your post-operative visit with Dr.                                     is: Date:                        Time:    Please call to schedule your post-operative visit.  5) Additional Instructions:

## 2020-08-19 NOTE — Op Note (Signed)
08/19/2020  3:50 PM  PATIENT:  Marissa Fletcher   MRN: [id number]   PRE-OPERATIVE DIAGNOSIS:  closed wedge compression fracture of T9   POST-OPERATIVE DIAGNOSIS:  closed wedge compression fracture of T9   PROCEDURE:  Procedure(s): KYPHOPLASTY T9  SURGEON: Laurene Footman, MD   ASSISTANTS: None   ANESTHESIA:   local and MAC   EBL:  No intake/output data recorded.   BLOOD ADMINISTERED:none   DRAINS: none    LOCAL MEDICATIONS USED:  MARCAINE    and XYLOCAINE    SPECIMEN:   None   DISPOSITION OF SPECIMEN:  Not applicable   COUNTS:  YES   TOURNIQUET:  * No tourniquets in log *   IMPLANTS: Bone cement   DICTATION: .Dragon Dictation  patient was brought to the operating room and after adequate anesthesia was obtained the patient was placed prone.  C arm was brought in in good visualization of the affected level obtained on both AP and lateral projections.  After patient identification and timeout procedures were completed, local anesthetic was infiltrated with 10 cc 1% Xylocaine infiltrated subcutaneously.  This is done the area on the each side of the planned approach.  The back was then prepped and draped in the usual sterile manner and repeat timeout procedure carried out.  A spinal needle was brought down to the pedicle on the each side of  T9 and a 50-50 mix of 1% Xylocaine half percent Sensorcaine with epinephrine total of 20 cc injected on each side.  After allowing this to set a small incision was made and the trocar was advanced into the vertebral body in an extrapedicular fashion.  Biopsy was not obtained Drilling was carried out balloon inserted with inflation to  for cc on the right and across the midline so second stick on the left was not performed.  When the cement was appropriate consistency for cc were injected on the right into the vertebral body without extravasation, good fill superior to inferior endplates and from right to left sides along the inferior endplate.   After the cement had set the trochar was removed and permanent C-arm views obtained.  The wound was closed with Dermabond followed by Band-Aid   PLAN OF CARE:  Discharge to home after recovery room   PATIENT DISPOSITION:  PACU - hemodynamically stable.

## 2020-08-20 ENCOUNTER — Encounter: Payer: Self-pay | Admitting: Orthopedic Surgery

## 2020-08-30 ENCOUNTER — Encounter: Payer: Self-pay | Admitting: Radiation Oncology

## 2020-08-30 ENCOUNTER — Encounter: Payer: Self-pay | Admitting: *Deleted

## 2020-08-30 ENCOUNTER — Ambulatory Visit
Admission: RE | Admit: 2020-08-30 | Discharge: 2020-08-30 | Disposition: A | Discharge status: Home / Self Care | Payer: Medicare Other | Source: Ambulatory Visit | Attending: Radiation Oncology | Admitting: Radiation Oncology

## 2020-08-30 ENCOUNTER — Other Ambulatory Visit: Payer: Self-pay

## 2020-08-30 VITALS — BP 129/83 | HR 101 | Temp 96.1°F | Wt 141.5 lb

## 2020-08-30 DIAGNOSIS — C3411 Malignant neoplasm of upper lobe, right bronchus or lung: Secondary | ICD-10-CM

## 2020-08-30 NOTE — Progress Notes (Signed)
  Oncology Nurse Navigator Documentation  Navigator Location: [facility] (08/30/20 1100)   )Navigator Encounter Type: Initial RadOnc (08/30/20 1100)                         Barriers/Navigation Needs: No Barriers At This Time;No Needs (08/30/20 1100)   Interventions: None Required (08/30/20 1100)         met with patient during initial consult with Dr. Baruch Gouty. All questions answered during visit. Pt did not have any needs at this time. Reviewed upcoming appts. Instructed to call with any further questions or needs. Pt verbalized understanding.             Time Spent with Patient: 60 (08/30/20 1100)

## 2020-08-30 NOTE — Consult Note (Signed)
NEW PATIENT EVALUATION  Name: Marissa Fletcher  MRN: [id number]  Date:   08/30/2020     DOB: 05-26-1940   This 80 y.o. 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  REFERRING PHYSICIAN: Juluis Pitch, MD  CHIEF COMPLAINT:  Chief Complaint  Patient presents with  . Follow-up    DIAGNOSIS: There were no encounter diagnoses.   PREVIOUS INVESTIGATIONS:  PET/CT and CT scans reviewed Pathology report reviewed Clinical notes reviewed  HPI: [name] who is seen in the emergency room for chest and lower back pain.  She was noted to have a compression fracture which eventually she had kyphoplasty to.  There was no evidence of malignancy in that specimen.  She also was noted on work-up on CT scan to have a right upper lobe nodule this was hypermetabolic on PET CT scan although no evidence of mediastinal or hilar adenopathy was demonstrated.  She underwent a CT-guided biopsy showing non-small cell lung cancer favoring adenocarcinoma.  Patient has been losing a little weight no specific cough hemoptysis or chest tightness she does have a general cough which is nonproductive.  She is slowly recuperating from her kyphoplasty with some improvement in pain in her back.  She is now referred to ration collagen for consideration of SBRT treatment.  PLANNED TREATMENT REGIMEN: SBRT  PAST MEDICAL HISTORY:  has a past medical history of Anxiety, Arthritis, Cancer ([place]), COPD (chronic obstructive pulmonary disease) ([place]), Depression, Diabetes mellitus without complication ([place]), History of kidney stones, Hypertension, and Thoracic aortic aneurysm ([place]).    PAST SURGICAL HISTORY:  Past Surgical History:  Procedure Laterality Date  . ABDOMINAL HYSTERECTOMY    . CHOLECYSTECTOMY    . EYE SURGERY Bilateral   . KYPHOPLASTY N/A 07/30/2020   Procedure: T10 Kyphoplasty;  Surgeon: Hessie Knows, MD;  Location: [facility] ORS;   Service: Orthopedics;  Laterality: N/A;  T10  . KYPHOPLASTY N/A 08/19/2020   Procedure: T9 YPHOPLASTY;  Surgeon: Hessie Knows, MD;  Location: [facility] ORS;  Service: Orthopedics;  Laterality: N/A;    FAMILY HISTORY: family history includes Bladder Cancer in her father; Congestive Heart Failure in her mother; Dementia in her mother; Heart disease in her father.  SOCIAL HISTORY:  reports that she has been smoking cigarettes. She has a 34.00 pack-year smoking history. She has never used smokeless tobacco. She reports that she does not drink alcohol and does not use drugs.  ALLERGIES: Sulfa antibiotics  MEDICATIONS:  Current Outpatient Medications  Medication Sig Dispense Refill  . ALPRAZolam (XANAX) 0.25 MG tablet Take 0.25 mg by mouth daily as needed for anxiety.    Marland Kitchen atorvastatin (LIPITOR) 20 MG tablet Take 20 mg by mouth daily. AM    . diclofenac Sodium (VOLTAREN) 1 % GEL Apply topically as needed.    . doxepin (SINEQUAN) 25 MG capsule Take 50-75 mg by mouth at bedtime.    . gabapentin (NEURONTIN) 300 MG capsule Take 300 mg by mouth at bedtime.    Marland Kitchen HYDROcodone-acetaminophen (NORCO/VICODIN) 5-325 MG tablet Take 1 tablet by mouth every 4 (four) hours as needed for severe pain. 20 tablet 0  . lidocaine (LIDODERM) 5 % Place 1 patch onto the skin daily. Remove & Discard patch within 12 hours or as directed by MD (Patient taking differently: Place 1 patch onto the skin as needed. Remove & Discard patch within 12 hours or as directed by MD) 30 patch 0  . lisinopril-hydrochlorothiazide (ZESTORETIC) 20-12.5  MG tablet Take 1 tablet by mouth every morning.    . metaxalone (SKELAXIN) 400 MG tablet Take 400 mg by mouth as needed.    . metFORMIN (GLUCOPHAGE-XR) 500 MG 24 hr tablet Take 500 mg by mouth 2 (two) times daily.    Marland Kitchen PARoxetine (PAXIL) 30 MG tablet Take 30 mg by mouth every morning.    . polyethylene glycol (MIRALAX / GLYCOLAX) 17 g packet Take 17 g by mouth daily as needed for moderate  constipation. 30 each 0  . nicotine (NICODERM CQ - DOSED IN MG/24 HOURS) 21 mg/24hr patch One 21 mg patch chest wall daily (okay to substitute generic) (Patient not taking: No sig reported) 28 patch 0   No current facility-administered medications for this encounter.    ECOG PERFORMANCE STATUS:  0 - Asymptomatic  REVIEW OF SYSTEMS: Patient denies any weight loss, fatigue, weakness, fever, chills or night sweats. Patient denies any loss of vision, blurred vision. Patient denies any ringing  of the ears or hearing loss. No irregular heartbeat. Patient denies heart murmur or history of fainting. Patient denies any chest pain or pain radiating to her upper extremities. Patient denies any shortness of breath, difficulty breathing at night, cough or hemoptysis. Patient denies any swelling in the lower legs. Patient denies any nausea vomiting, vomiting of blood, or coffee ground material in the vomitus. Patient denies any stomach pain. Patient states has had normal bowel movements no significant constipation or diarrhea. Patient denies any dysuria, hematuria or significant nocturia. Patient denies any problems walking, swelling in the joints or loss of balance. Patient denies any skin changes, loss of hair or loss of weight. Patient denies any excessive worrying or anxiety or significant depression. Patient denies any problems with insomnia. Patient denies excessive thirst, polyuria, polydipsia. Patient denies any swollen glands, patient denies easy bruising or easy bleeding. Patient denies any recent infections, allergies or URI. Patient "s visual fields have not changed significantly in recent time.   PHYSICAL EXAM: BP 129/83   Pulse (!) 101   Temp (!) 96.1 F (35.6 C) (Tympanic)   Wt 141 lb 8 oz (64.2 kg)   BMI 24.29 kg/m  Well-developed well-nourished patient in NAD. HEENT reveals PERLA, EOMI, discs not visualized.  Oral cavity is clear. No oral mucosal lesions are identified. Neck is clear without  evidence of cervical or supraclavicular adenopathy. Lungs are clear to A&P. Cardiac examination is essentially unremarkable with regular rate and rhythm without murmur rub or thrill. Abdomen is benign with no organomegaly or masses noted. Motor sensory and DTR levels are equal and symmetric in the upper and lower extremities. Cranial nerves II through XII are grossly intact. Proprioception is intact. No peripheral adenopathy or edema is identified. No motor or sensory levels are noted. Crude visual fields are within normal range.  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  We will treat to 60 Gray in 5 fractions.  Would use motion restriction for 4D treatment planning.  Risks and benefits of treatment including possible slight worsening of cough over time fatigue and slight chance of skin reaction all were discussed in detail with the patient and her daughter.  They both seem to comprehend my treatment plan well.  I  personally 7 ordered CT simulation for later this week.  Patient daughter know to call with any concerns.  I would like to take this opportunity to thank you for allowing me to participate in the care of your patient.Noreene Filbert, MD

## 2020-09-01 ENCOUNTER — Encounter: Payer: Self-pay | Admitting: *Deleted

## 2020-09-01 ENCOUNTER — Ambulatory Visit
Admission: RE | Admit: 2020-09-01 | Discharge: 2020-09-01 | Disposition: A | Discharge status: Home / Self Care | Payer: Medicare Other | Source: Ambulatory Visit | Attending: Radiation Oncology | Admitting: Radiation Oncology

## 2020-09-01 DIAGNOSIS — Z510 Encounter for antineoplastic radiation therapy: Secondary | ICD-10-CM | POA: Diagnosis not present

## 2020-09-01 DIAGNOSIS — C3411 Malignant neoplasm of upper lobe, right bronchus or lung: Secondary | ICD-10-CM | POA: Diagnosis present

## 2020-09-01 NOTE — Progress Notes (Signed)
  Oncology Nurse Navigator Documentation  Navigator Location: [facility] (09/01/20 1400)   )Navigator Encounter Type: Appt/Treatment Plan Review (09/01/20 1400)                     Patient Visit Type: RTMYTR (09/01/20 1400) Treatment Phase: CT SIM (09/01/20 1400) Barriers/Navigation Needs: No Barriers At This Time (09/01/20 1400)   Interventions: None Required (09/01/20 1400)

## 2020-09-08 ENCOUNTER — Telehealth: Payer: Self-pay

## 2020-09-08 DIAGNOSIS — Z510 Encounter for antineoplastic radiation therapy: Secondary | ICD-10-CM | POA: Diagnosis not present

## 2020-09-08 NOTE — Telephone Encounter (Signed)
Trial:  Marissa Fletcher  Patient Marissa Fletcher was identified by Jeral Fruit, RN as a potential candidate for the above listed study.  This Clinical Research Nurse met with [name], [id number], in a manner and location that ensures patient privacy to discuss participation in the above listed research study via secure phone line.  Patient is Unaccompanied.  A copy of the informed consent document and separate HIPAA Authorization was provided to the patient.  Patient reads, speaks, and understands [place].   Patient was provided with the business card of this Nurse and encouraged to contact the research team with any questions.  Approximately 20 minutes were spent with the patient reviewing the informed consent documents over the telephone. The patient states she wants to participate and is in agreement to come in early on Monday, September 13, 2020 at [place] am for an official consent visit and labs prior to her appointment with radiation therapy at 0930 am.  Jeral Fruit, RN 09/08/20 2:59 PM

## 2020-09-09 ENCOUNTER — Other Ambulatory Visit: Payer: Self-pay

## 2020-09-09 DIAGNOSIS — C3491 Malignant neoplasm of unspecified part of right bronchus or lung: Secondary | ICD-10-CM

## 2020-09-09 NOTE — Progress Notes (Signed)
Research labs for [employer].

## 2020-09-09 NOTE — Research (Signed)
Title: [id number]  This Nurse has reviewed this patient's inclusion and exclusion criteria as a second review and confirms Marissa Fletcher is eligible for study participation.  Patient may continue with enrollment. Initial eligibility confirmation was provided by Carol Ada, CRC.  Jeral Fruit, RN 09/09/20 12:03 PM

## 2020-09-13 ENCOUNTER — Encounter: Payer: Self-pay | Admitting: *Deleted

## 2020-09-13 ENCOUNTER — Other Ambulatory Visit: Payer: Self-pay

## 2020-09-13 ENCOUNTER — Inpatient Hospital Stay: Payer: Medicare Other | Attending: Radiation Oncology

## 2020-09-13 ENCOUNTER — Ambulatory Visit
Admission: RE | Admit: 2020-09-13 | Discharge: 2020-09-13 | Disposition: A | Discharge status: Home / Self Care | Payer: Medicare Other | Source: Ambulatory Visit | Attending: Radiation Oncology | Admitting: Radiation Oncology

## 2020-09-13 DIAGNOSIS — C3491 Malignant neoplasm of unspecified part of right bronchus or lung: Secondary | ICD-10-CM | POA: Diagnosis not present

## 2020-09-13 DIAGNOSIS — Z510 Encounter for antineoplastic radiation therapy: Secondary | ICD-10-CM | POA: Diagnosis not present

## 2020-09-13 NOTE — Research (Addendum)
Erroneously added, please remove.

## 2020-09-13 NOTE — Research (Addendum)
Trial Name:  [place]  Patient Marissa Fletcher was identified by Jeral Fruit, RN as a potential candidate for the above listed study.  This Clinical Research Nurse met with [name], [id number] on 09/13/20 in a manner and location that ensures patient privacy to discuss participation in the above listed research study.  Patient is Unaccompanied.  Informed consent documents were reviewed with the patient via secure telephone call.  Patient has not yet read the informed consent documents and so documents were reviewed page by page today.  As outlined in the informed consent form, this Nurse and [name] discussed the purpose of the research study, the investigational nature of the study, study procedures and requirements for study participation, potential risks and benefits of study participation, as well as alternatives to participation.  This study is not blinded or double-blinded. The patient understands participation is voluntary and they may withdraw from study participation at any time.  This study does not involve randomization.  This study does not involve an investigational drug or device. This study does not involve a placebo. Patient understands enrollment is pending full eligibility review.   Confidentiality and how the patient's information will be used as part of study participation were discussed.  Patient was informed there is reimbursement provided for their time and effort spent on trial participation.  The patient is encouraged to discuss research study participation with their insurance provider to determine what costs they may incur as part of study participation, including research related injury.    All questions were answered to patient's satisfaction.  The informed consent and separate HIPAA Authorization was reviewed page by page.  The patient's mental and emotional status is appropriate to provide informed consent, and the patient verbalizes an understanding  of study participation.  Patient has agreed to participate in the above listed research study and has voluntarily signed the informed consent version and separate HIPAA Authorization, version Version 5, 05/07/2019 on 09/13/20 at 0845 AM.  The patient was provided with a copy of the signed informed consent form and separate HIPAA Authorization for their reference.  No study specific procedures were obtained prior to the signing of the informed consent document.  Approximately 20 minutes were spent with the patient reviewing the informed consent documents.  After obtaining informed consent patient, voluntarily signed the optional Release of Information form for use throughout trial participation.  Jeral Fruit, RN 09/13/20 9:02 AM

## 2020-09-15 ENCOUNTER — Ambulatory Visit
Admission: RE | Admit: 2020-09-15 | Discharge: 2020-09-15 | Disposition: A | Discharge status: Home / Self Care | Payer: Medicare Other | Source: Ambulatory Visit | Attending: Radiation Oncology | Admitting: Radiation Oncology

## 2020-09-15 DIAGNOSIS — Z510 Encounter for antineoplastic radiation therapy: Secondary | ICD-10-CM | POA: Diagnosis not present

## 2020-09-20 ENCOUNTER — Ambulatory Visit
Admission: RE | Admit: 2020-09-20 | Discharge: 2020-09-20 | Disposition: A | Discharge status: Home / Self Care | Payer: Medicare Other | Source: Ambulatory Visit | Attending: Radiation Oncology | Admitting: Radiation Oncology

## 2020-09-20 DIAGNOSIS — C3411 Malignant neoplasm of upper lobe, right bronchus or lung: Secondary | ICD-10-CM | POA: Insufficient documentation

## 2020-09-20 DIAGNOSIS — Z510 Encounter for antineoplastic radiation therapy: Secondary | ICD-10-CM | POA: Diagnosis not present

## 2020-09-22 ENCOUNTER — Ambulatory Visit
Admission: RE | Admit: 2020-09-22 | Discharge: 2020-09-22 | Disposition: A | Discharge status: Home / Self Care | Payer: Medicare Other | Source: Ambulatory Visit | Attending: Radiation Oncology | Admitting: Radiation Oncology

## 2020-09-22 DIAGNOSIS — Z510 Encounter for antineoplastic radiation therapy: Secondary | ICD-10-CM | POA: Diagnosis not present

## 2020-09-27 ENCOUNTER — Ambulatory Visit
Admission: RE | Admit: 2020-09-27 | Discharge: 2020-09-27 | Disposition: A | Discharge status: Home / Self Care | Payer: Medicare Other | Source: Ambulatory Visit | Attending: Radiation Oncology | Admitting: Radiation Oncology

## 2020-09-27 DIAGNOSIS — Z510 Encounter for antineoplastic radiation therapy: Secondary | ICD-10-CM | POA: Diagnosis not present

## 2020-10-06 ENCOUNTER — Encounter: Payer: Self-pay | Admitting: *Deleted

## 2020-10-19 DIAGNOSIS — Z8781 Personal history of (healed) traumatic fracture: Secondary | ICD-10-CM | POA: Insufficient documentation

## 2020-10-29 ENCOUNTER — Ambulatory Visit: Payer: Medicare Other | Admitting: Radiation Oncology

## 2020-11-12 ENCOUNTER — Encounter: Payer: Self-pay | Admitting: Radiation Oncology

## 2020-11-12 ENCOUNTER — Other Ambulatory Visit: Payer: Self-pay | Admitting: *Deleted

## 2020-11-12 ENCOUNTER — Ambulatory Visit
Admission: RE | Admit: 2020-11-12 | Discharge: 2020-11-12 | Disposition: A | Discharge status: Home / Self Care | Payer: Medicare Other | Source: Ambulatory Visit | Attending: Radiation Oncology | Admitting: Radiation Oncology

## 2020-11-12 ENCOUNTER — Other Ambulatory Visit: Payer: Self-pay

## 2020-11-12 DIAGNOSIS — Z923 Personal history of irradiation: Secondary | ICD-10-CM | POA: Diagnosis not present

## 2020-11-12 DIAGNOSIS — C3411 Malignant neoplasm of upper lobe, right bronchus or lung: Secondary | ICD-10-CM

## 2020-11-12 NOTE — Progress Notes (Signed)
Radiation Oncology Follow up Note  Name: Marissa Fletcher   Date:   11/12/2020 MRN:  [id number] DOB: May 28, 1940    This 80 y.o. 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  REFERRING PROVIDER: Juluis Pitch, MD  HPI: [name] now at 1 month having completed SBRT to her right upper lobe for stage I adenocarcinoma.  Seen today in routine follow-up she is doing well.  She specifically denies any dysphagia cough chest tightness or any change in her pulmonary status..  COMPLICATIONS OF TREATMENT: none  FOLLOW UP COMPLIANCE: keeps appointments   PHYSICAL EXAM:  BP (!) (P) 149/71 (BP Location: Left Arm, Patient Position: Sitting)   Pulse (P) 100   Temp (!) (P) 96.8 F (36 C) (Tympanic)   Resp (P) 20   Wt (P) 137 lb 4.8 oz (62.3 kg)   BMI (P) 23.57 kg/m  Well-developed well-nourished patient in NAD. HEENT reveals PERLA, EOMI, discs not visualized.  Oral cavity is clear. No oral mucosal lesions are identified. Neck is clear without evidence of cervical or supraclavicular adenopathy. Lungs are clear to A&P. Cardiac examination is essentially unremarkable with regular rate and rhythm without murmur rub or thrill. Abdomen is benign with no organomegaly or masses noted. Motor sensory and DTR levels are equal and symmetric in the upper and lower extremities. Cranial nerves II through XII are grossly intact. Proprioception is intact. No peripheral adenopathy or edema is identified. No motor or sensory levels are noted. Crude visual fields are within normal range.  RADIOLOGY RESULTS: No current films for review  PLAN: Present time she is doing well with extremely low side effect profile from her recent SBRT.  And pleased with her overall progress.  I have asked to see her back in 3 months with a follow-up CT scan of the chest at that time.  Patient knows to call with any concerns.  I would like to  take this opportunity to thank you for allowing me to participate in the care of your patient.Noreene Filbert, MD

## 2021-02-08 ENCOUNTER — Other Ambulatory Visit: Payer: Self-pay

## 2021-02-08 ENCOUNTER — Ambulatory Visit
Admission: RE | Admit: 2021-02-08 | Discharge: 2021-02-08 | Disposition: A | Discharge status: Home / Self Care | Payer: Medicare Other | Source: Ambulatory Visit | Attending: Radiation Oncology | Admitting: Radiation Oncology

## 2021-02-08 DIAGNOSIS — C3411 Malignant neoplasm of upper lobe, right bronchus or lung: Secondary | ICD-10-CM | POA: Insufficient documentation

## 2021-02-08 LAB — POCT I-STAT CREATININE: Creatinine, Ser: 0.6 mg/dL (ref 0.44–1.00)

## 2021-02-08 IMAGING — CT CT CHEST W/ CM
2 of 4 series · 15 of 36 positions shown, 18 images · IV contrast (omnipaque)
Comparison: PET-CT, [DATE]

CLINICAL DATA: Right upper lobe cancer restaging, status post XRT

EXAM:
CT CHEST WITH CONTRAST
TECHNIQUE: Multidetector CT imaging of the chest was performed during
intravenous contrast administration.
CONTRAST:  50mL OMNIPAQUE IOHEXOL 350 MG/ML SOLN

[Series 2: axial chest 2.00 · axial · 0.57mm/px · z∈[-1158,-906]mm · 12 of 150 slices shown, 15 images]
[im 12/150  mediastinal]
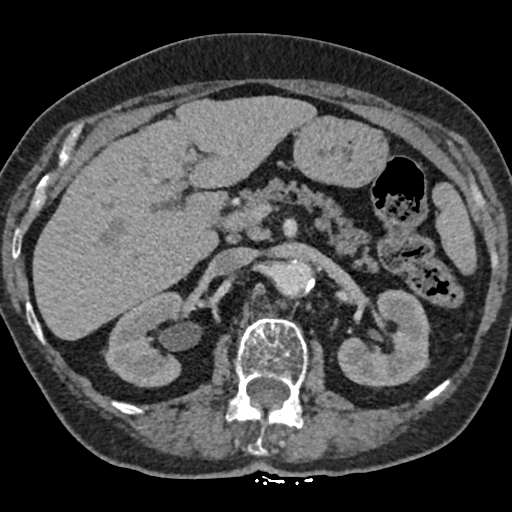
[im 12/150  lung]
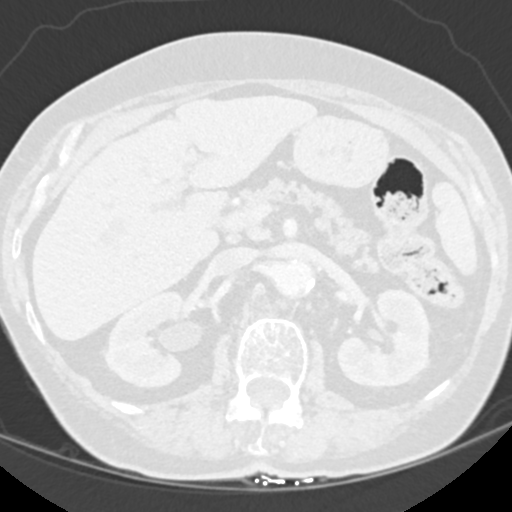
[im 23/150  lung]
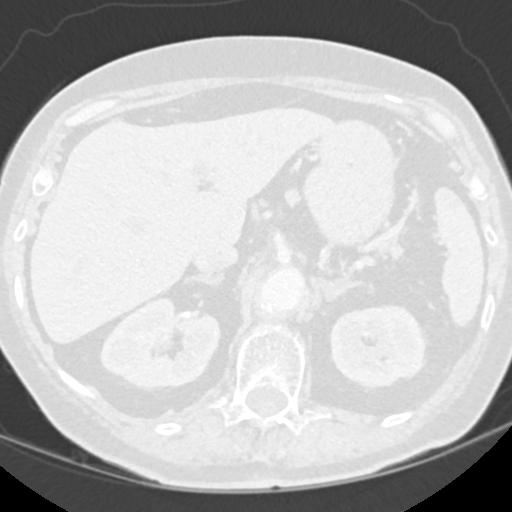
[im 35/150  lung]
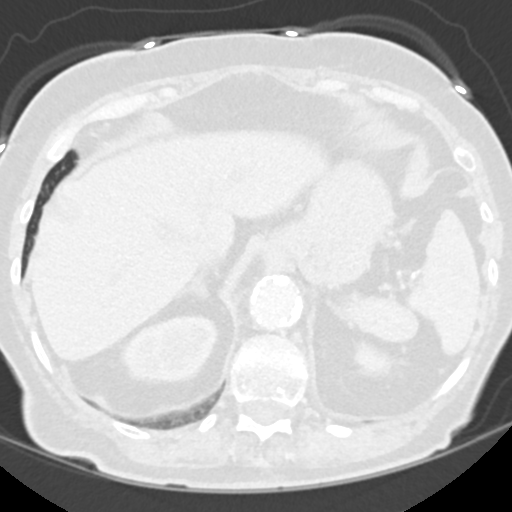
[im 46/150  lung]
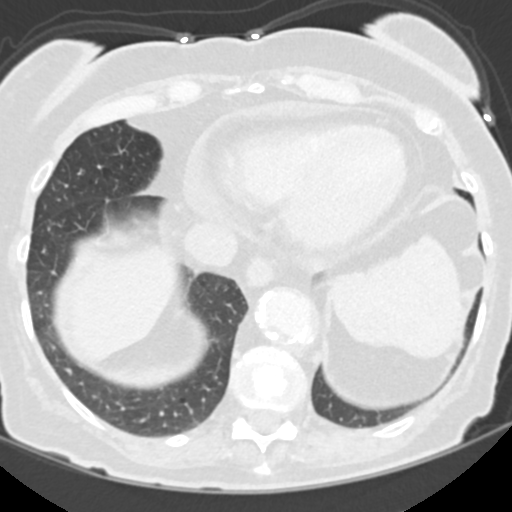
[im 58/150  mediastinal]
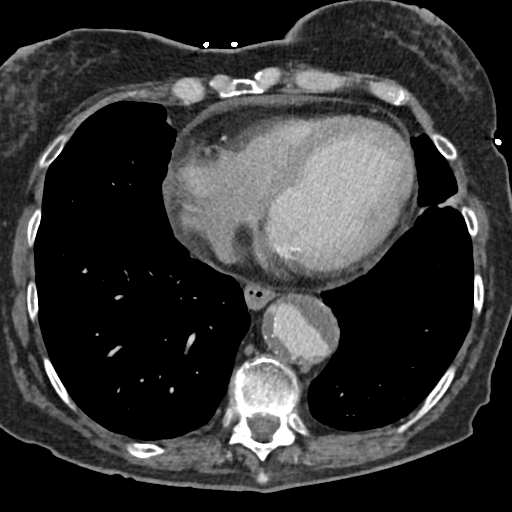
[im 58/150  lung]
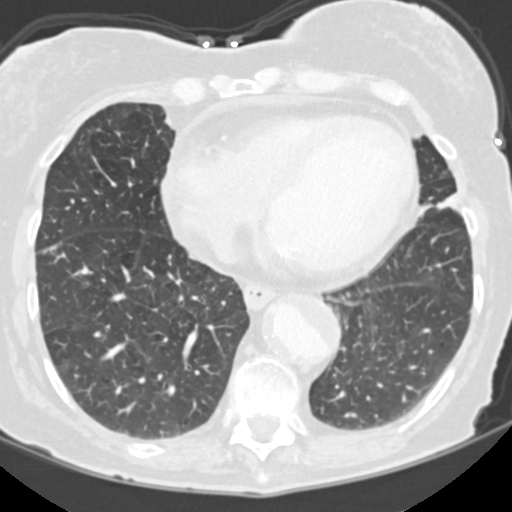
[im 69/150  lung]
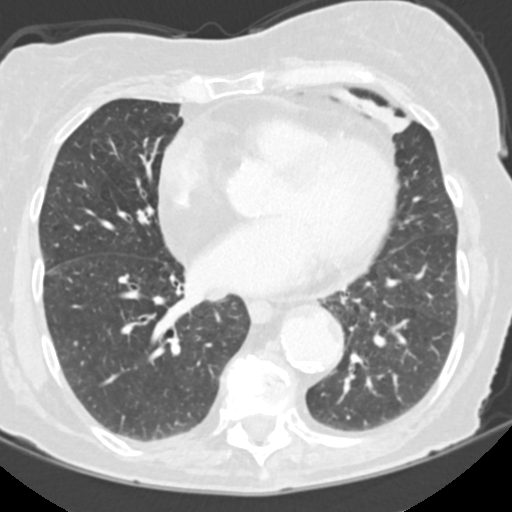
[im 81/150  lung]
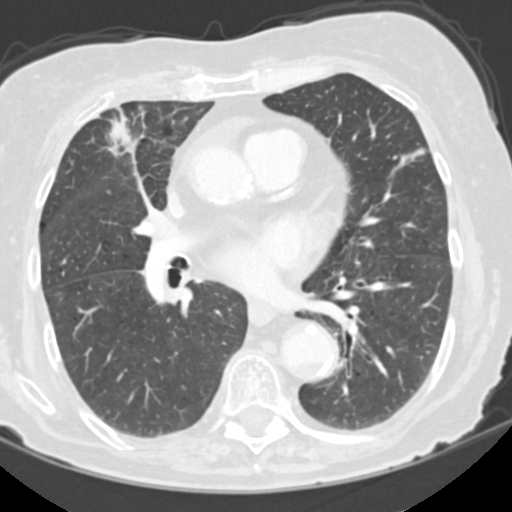
[im 92/150  lung]
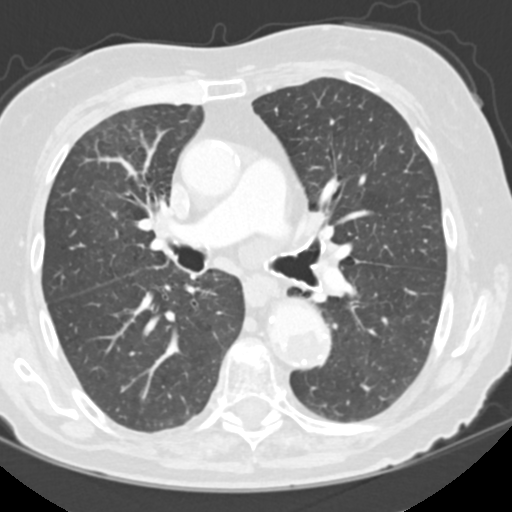
[im 104/150  mediastinal]
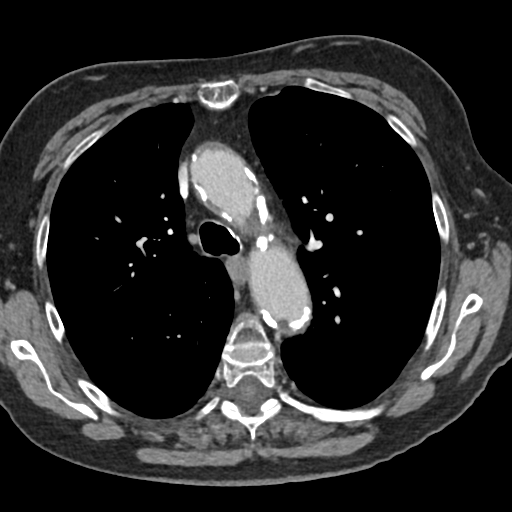
[im 104/150  lung]
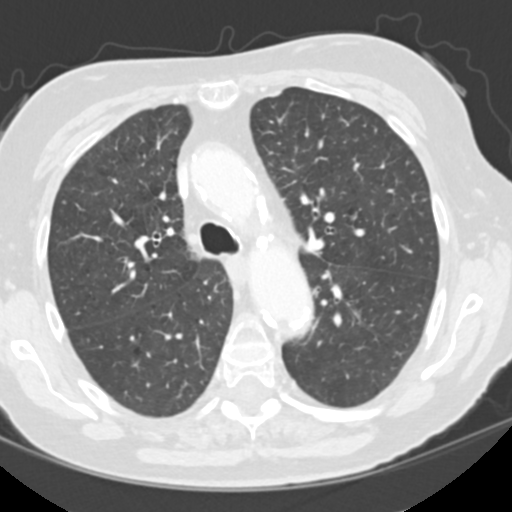
[im 115/150  lung]
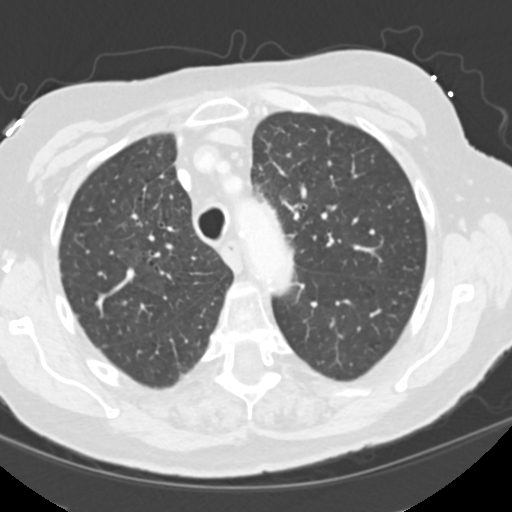
[im 127/150  lung]
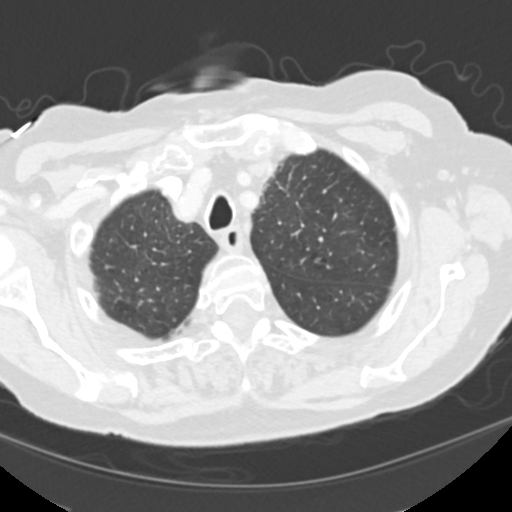
[im 138/150  lung]
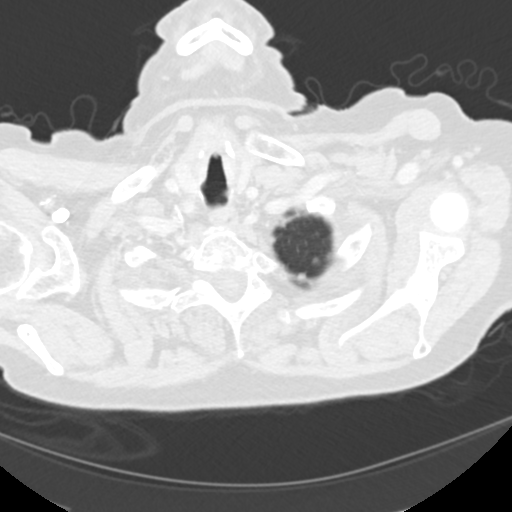

[Series 4: coronal chest 2.00 cor · coronal · 0.57mm/px · 3 of 136 slices shown]
[im 28/136  lung]
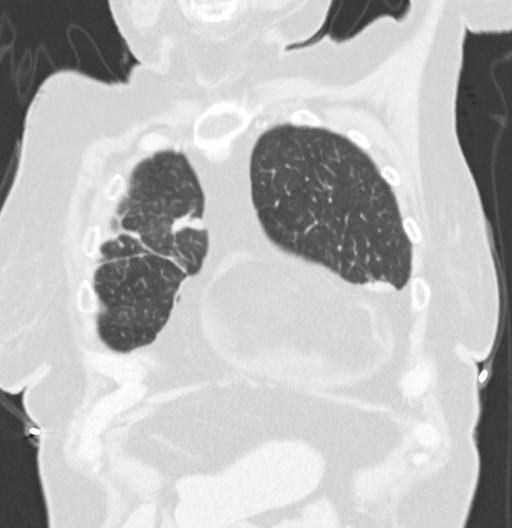
[im 55/136  lung]
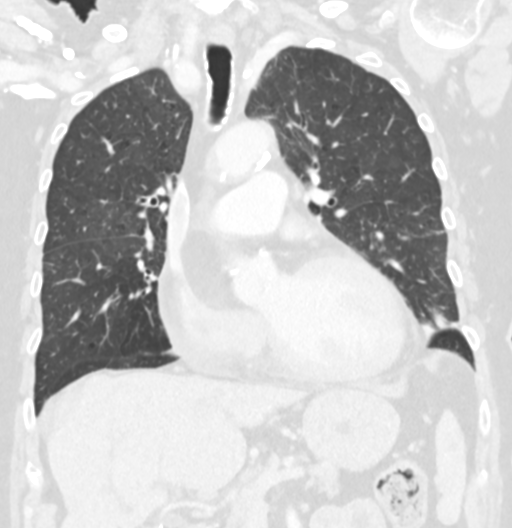
[im 82/136  lung]
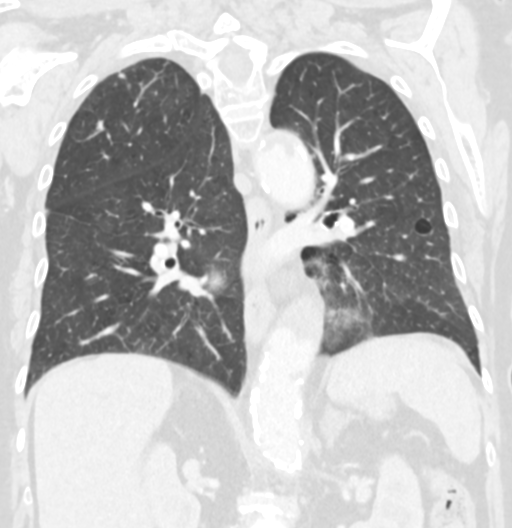

[15 of 36 positions shown; findings below may reference images not displayed]

FINDINGS: Cardiovascular: Severe mixed aortic atherosclerosis, with a large
burden of irregular mural thrombus of the descending thoracic aorta.
The descending thoracic aorta is aneurysmal, measuring up to 4.2 x
3.9 cm (series 4, image 90, series 2, image 96). The aneurysmal
abdominal aorta is incompletely imaged. Cardiomegaly. Three-vessel
coronary artery calcifications. Trace pericardial effusion.

Mediastinum/Nodes: No enlarged mediastinal, hilar, or axillary lymph
nodes. Thyroid gland, trachea, and esophagus demonstrate no
significant findings.

Lungs/Pleura: Mild centrilobular emphysema. Diffuse bilateral
bronchial wall thickening. Innumerable tiny centrilobular pulmonary
nodules, most concentrated in the lung apices. There is a
redemonstrated irregular nodule of the anterior, inferior right
upper lobe, which measures approximately 2.3 x 1.6 x 1.1 cm, with
new irregular opacity in the adjacent right upper lobe (series 3,
image 69). No pleural effusion or pneumothorax.

Upper Abdomen: No acute abnormality.

Musculoskeletal: No chest wall mass or suspicious bone lesions
identified.
IMPRESSION: 1. There is a redemonstrated irregular nodule of the anterior,
inferior right upper lobe, which measures approximately 2.3 x 1.6 x
1.1 cm, with new irregular opacity in the adjacent right upper lobe.
This nodule is not significantly changed in size, and there is
expected, early evolution of adjacent radiation pneumonitis and
fibrosis. Attention on follow-up.
2. Background innumerable tiny centrilobular pulmonary nodules, most
concentrated in the lung apices, consistent with smoking-related
respiratory bronchiolitis.
3. Emphysema and diffuse bilateral bronchial wall thickening.
4. The descending thoracic aorta is aneurysmal, measuring up to
x 3.9 cm, unchanged. The aneurysmal abdominal aorta is incompletely
imaged. Recommend annual imaging followup by CTA or MRA not
otherwise imaged. This recommendation follows [R1]
ACCF/AHA/AATS/ACR/ASA/SCA/INA ABA/INA ABA/INA ABA/INA ABA Guidelines for the
Diagnosis and Management of Patients with Thoracic Aortic Disease.
Circulation. [R1]; 121: [id number]. Aortic aneurysm NOS ([R1]-[R1])
5. Coronary artery disease.

Aortic Atherosclerosis ([R1]-[R1]) and Emphysema ([R1]-[R1]).

## 2021-02-08 MED ORDER — IOHEXOL 350 MG/ML SOLN
50.0000 mL | Freq: Once | INTRAVENOUS | Status: AC | PRN
  Administered 2021-02-08: 50 mL via INTRAVENOUS

## 2021-02-14 ENCOUNTER — Ambulatory Visit
Admission: RE | Admit: 2021-02-14 | Discharge: 2021-02-14 | Disposition: A | Discharge status: Home / Self Care | Payer: Medicare Other | Source: Ambulatory Visit | Attending: Radiation Oncology | Admitting: Radiation Oncology

## 2021-02-14 ENCOUNTER — Other Ambulatory Visit: Payer: Self-pay | Admitting: Licensed Clinical Social Worker

## 2021-02-14 ENCOUNTER — Encounter: Payer: Self-pay | Admitting: Radiation Oncology

## 2021-02-14 VITALS — BP 162/79 | HR 107 | Temp 97.4°F | Wt 134.6 lb

## 2021-02-14 DIAGNOSIS — C3411 Malignant neoplasm of upper lobe, right bronchus or lung: Secondary | ICD-10-CM

## 2021-02-14 DIAGNOSIS — Z923 Personal history of irradiation: Secondary | ICD-10-CM | POA: Insufficient documentation

## 2021-02-14 DIAGNOSIS — C3491 Malignant neoplasm of unspecified part of right bronchus or lung: Secondary | ICD-10-CM

## 2021-02-14 DIAGNOSIS — I712 Thoracic aortic aneurysm, without rupture: Secondary | ICD-10-CM | POA: Insufficient documentation

## 2021-02-14 NOTE — Progress Notes (Signed)
Radiation Oncology Follow up Note  Name: Marissa Fletcher   Date:   02/14/2021 MRN:  [id number] DOB: Nov 10, 1940    This 80 y.o. 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  Seen today in routine follow-up she is doing well specifically denies cough hemoptysis or chest tightness..  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  COMPLICATIONS OF TREATMENT: none  FOLLOW UP COMPLIANCE: keeps appointments   PHYSICAL EXAM:  BP (!) 162/79   Pulse (!) 107   Temp (!) 97.4 F (36.3 C) (Tympanic)   Wt 134 lb 9.6 oz (61.1 kg)   BMI 23.10 kg/m  Well-developed well-nourished patient in NAD. HEENT reveals PERLA, EOMI, discs not visualized.  Oral cavity is clear. No oral mucosal lesions are identified. Neck is clear without evidence of cervical or supraclavicular adenopathy. Lungs are clear to A&P. Cardiac examination is essentially unremarkable with regular rate and rhythm without murmur rub or thrill. Abdomen is benign with no organomegaly or masses noted. Motor sensory and DTR levels are equal and symmetric in the upper and lower extremities. Cranial nerves II through XII are grossly intact. Proprioception is intact. No peripheral adenopathy or edema is identified. No motor or sensory levels are noted. Crude visual fields are within normal range.  RADIOLOGY RESULTS: CT scans reviewed compatible with above-stated findings  PLAN: Present time patient clinically is doing well.  I have  reviewed her CT scans believe all these changes are compatible with radiation scarring.  I have asked to see her back in 6 months for follow-up with a repeat CT scan at that time.  Of also asked her to contact vascular surgery to have them keep tabs on her aortic aneurysm.  Patient and daughter both comprehend my recommendations well.  I would like to take this opportunity to thank you for allowing me to participate in the care of your patient.Noreene Filbert, MD

## 2021-02-14 NOTE — Progress Notes (Signed)
Survivorship Care Plan visit completed.  Treatment summary reviewed and given to patient.  ASCO answers booklet reviewed and given to patient.  CARE program and Cancer Transitions discussed with patient along with other resources cancer center offers to patients and caregivers.  Patient verbalized understanding.    

## 2021-05-17 ENCOUNTER — Inpatient Hospital Stay (HOSPITAL_COMMUNITY)
Admit: 2021-05-17 | Discharge: 2021-05-17 | Disposition: A | Discharge status: Home / Self Care | Payer: Medicare Other | Attending: Hospitalist | Admitting: Hospitalist

## 2021-05-17 ENCOUNTER — Emergency Department: Payer: Medicare Other

## 2021-05-17 ENCOUNTER — Inpatient Hospital Stay
Admission: EM | Admit: 2021-05-17 | Discharge: 2021-05-20 | DRG: 064 | Disposition: A | Discharge status: Home Health | Payer: Medicare Other | Attending: Family Medicine | Admitting: Family Medicine

## 2021-05-17 ENCOUNTER — Inpatient Hospital Stay: Payer: Medicare Other

## 2021-05-17 ENCOUNTER — Other Ambulatory Visit: Payer: Self-pay

## 2021-05-17 DIAGNOSIS — Z85118 Personal history of other malignant neoplasm of bronchus and lung: Secondary | ICD-10-CM

## 2021-05-17 DIAGNOSIS — I63233 Cerebral infarction due to unspecified occlusion or stenosis of bilateral carotid arteries: Secondary | ICD-10-CM | POA: Diagnosis not present

## 2021-05-17 DIAGNOSIS — I7123 Aneurysm of the descending thoracic aorta, without rupture: Secondary | ICD-10-CM | POA: Diagnosis present

## 2021-05-17 DIAGNOSIS — Z8249 Family history of ischemic heart disease and other diseases of the circulatory system: Secondary | ICD-10-CM

## 2021-05-17 DIAGNOSIS — F419 Anxiety disorder, unspecified: Secondary | ICD-10-CM | POA: Diagnosis present

## 2021-05-17 DIAGNOSIS — Z923 Personal history of irradiation: Secondary | ICD-10-CM | POA: Diagnosis not present

## 2021-05-17 DIAGNOSIS — G8194 Hemiplegia, unspecified affecting left nondominant side: Secondary | ICD-10-CM | POA: Diagnosis present

## 2021-05-17 DIAGNOSIS — R29701 NIHSS score 1: Secondary | ICD-10-CM | POA: Diagnosis present

## 2021-05-17 DIAGNOSIS — R531 Weakness: Secondary | ICD-10-CM | POA: Diagnosis not present

## 2021-05-17 DIAGNOSIS — Z7984 Long term (current) use of oral hypoglycemic drugs: Secondary | ICD-10-CM | POA: Diagnosis not present

## 2021-05-17 DIAGNOSIS — I513 Intracardiac thrombosis, not elsewhere classified: Secondary | ICD-10-CM | POA: Diagnosis present

## 2021-05-17 DIAGNOSIS — Z79899 Other long term (current) drug therapy: Secondary | ICD-10-CM | POA: Diagnosis not present

## 2021-05-17 DIAGNOSIS — C3411 Malignant neoplasm of upper lobe, right bronchus or lung: Secondary | ICD-10-CM

## 2021-05-17 DIAGNOSIS — I5021 Acute systolic (congestive) heart failure: Secondary | ICD-10-CM | POA: Diagnosis present

## 2021-05-17 DIAGNOSIS — Z20822 Contact with and (suspected) exposure to covid-19: Secondary | ICD-10-CM | POA: Diagnosis present

## 2021-05-17 DIAGNOSIS — I429 Cardiomyopathy, unspecified: Secondary | ICD-10-CM | POA: Diagnosis not present

## 2021-05-17 DIAGNOSIS — M7989 Other specified soft tissue disorders: Secondary | ICD-10-CM

## 2021-05-17 DIAGNOSIS — Z90710 Acquired absence of both cervix and uterus: Secondary | ICD-10-CM

## 2021-05-17 DIAGNOSIS — J9601 Acute respiratory failure with hypoxia: Secondary | ICD-10-CM | POA: Diagnosis present

## 2021-05-17 DIAGNOSIS — Z853 Personal history of malignant neoplasm of breast: Secondary | ICD-10-CM

## 2021-05-17 DIAGNOSIS — J449 Chronic obstructive pulmonary disease, unspecified: Secondary | ICD-10-CM | POA: Diagnosis present

## 2021-05-17 DIAGNOSIS — F17210 Nicotine dependence, cigarettes, uncomplicated: Secondary | ICD-10-CM | POA: Diagnosis present

## 2021-05-17 DIAGNOSIS — F32A Depression, unspecified: Secondary | ICD-10-CM | POA: Diagnosis present

## 2021-05-17 DIAGNOSIS — I420 Dilated cardiomyopathy: Secondary | ICD-10-CM | POA: Diagnosis present

## 2021-05-17 DIAGNOSIS — E876 Hypokalemia: Secondary | ICD-10-CM | POA: Diagnosis present

## 2021-05-17 DIAGNOSIS — Z8052 Family history of malignant neoplasm of bladder: Secondary | ICD-10-CM

## 2021-05-17 DIAGNOSIS — I639 Cerebral infarction, unspecified: Secondary | ICD-10-CM | POA: Diagnosis present

## 2021-05-17 DIAGNOSIS — E785 Hyperlipidemia, unspecified: Secondary | ICD-10-CM | POA: Diagnosis present

## 2021-05-17 DIAGNOSIS — I7411 Embolism and thrombosis of thoracic aorta: Secondary | ICD-10-CM | POA: Diagnosis not present

## 2021-05-17 DIAGNOSIS — D509 Iron deficiency anemia, unspecified: Secondary | ICD-10-CM | POA: Diagnosis present

## 2021-05-17 DIAGNOSIS — I6389 Other cerebral infarction: Secondary | ICD-10-CM | POA: Diagnosis not present

## 2021-05-17 DIAGNOSIS — E119 Type 2 diabetes mellitus without complications: Secondary | ICD-10-CM | POA: Diagnosis not present

## 2021-05-17 DIAGNOSIS — I110 Hypertensive heart disease with heart failure: Secondary | ICD-10-CM | POA: Diagnosis present

## 2021-05-17 DIAGNOSIS — I10 Essential (primary) hypertension: Secondary | ICD-10-CM | POA: Diagnosis not present

## 2021-05-17 DIAGNOSIS — R2990 Unspecified symptoms and signs involving the nervous system: Secondary | ICD-10-CM

## 2021-05-17 DIAGNOSIS — E1140 Type 2 diabetes mellitus with diabetic neuropathy, unspecified: Secondary | ICD-10-CM | POA: Diagnosis present

## 2021-05-17 DIAGNOSIS — I82409 Acute embolism and thrombosis of unspecified deep veins of unspecified lower extremity: Secondary | ICD-10-CM

## 2021-05-17 DIAGNOSIS — I440 Atrioventricular block, first degree: Secondary | ICD-10-CM | POA: Diagnosis present

## 2021-05-17 HISTORY — DX: Cerebral infarction, unspecified: I63.9

## 2021-05-17 HISTORY — DX: Tobacco use: Z72.0

## 2021-05-17 HISTORY — DX: Cardiomyopathy, unspecified: I42.9

## 2021-05-17 HISTORY — DX: Disorder of arteries and arterioles, unspecified: I77.9

## 2021-05-17 HISTORY — DX: Malignant neoplasm of unspecified part of unspecified bronchus or lung: C34.90

## 2021-05-17 HISTORY — DX: Atherosclerosis of aorta: I70.0

## 2021-05-17 LAB — LIPID PANEL
Cholesterol: 147 mg/dL (ref 0–200)
HDL: 38 mg/dL — ABNORMAL LOW (ref >40)
LDL Cholesterol: 96 mg/dL (ref 0–99)
Total CHOL/HDL Ratio: 3.9 RATIO
Triglycerides: 65 mg/dL (ref <150)
VLDL: 13 mg/dL (ref 0–40)

## 2021-05-17 LAB — COMPREHENSIVE METABOLIC PANEL
ALT: 12 U/L (ref 0–44)
AST: 19 U/L (ref 15–41)
Albumin: 3.5 g/dL (ref 3.5–5.0)
Alkaline Phosphatase: 132 U/L — ABNORMAL HIGH (ref 38–126)
Anion gap: 9 (ref 5–15)
BUN: 9 mg/dL (ref 8–23)
CO2: 29 mmol/L (ref 22–32)
Calcium: 8 mg/dL — ABNORMAL LOW (ref 8.9–10.3)
Chloride: 99 mmol/L (ref 98–111)
Creatinine, Ser: 0.63 mg/dL (ref 0.44–1.00)
GFR, Estimated: >60 mL/min (ref >60)
Glucose, Bld: 119 mg/dL — ABNORMAL HIGH (ref 70–99)
Potassium: 2.9 mmol/L — ABNORMAL LOW (ref 3.5–5.1)
Sodium: 137 mmol/L (ref 135–145)
Total Bilirubin: 0.5 mg/dL (ref 0.3–1.2)
Total Protein: 7 g/dL (ref 6.5–8.1)

## 2021-05-17 LAB — CBC
HCT: 30.7 % — ABNORMAL LOW (ref 36.0–46.0)
Hemoglobin: 9.6 g/dL — ABNORMAL LOW (ref 12.0–15.0)
MCH: 24.1 pg — ABNORMAL LOW (ref 26.0–34.0)
MCHC: 31.3 g/dL (ref 30.0–36.0)
MCV: 77.1 fL — ABNORMAL LOW (ref 80.0–100.0)
Platelets: 323 10*3/uL (ref 150–400)
RBC: 3.98 MIL/uL (ref 3.87–5.11)
RDW: 16.5 % — ABNORMAL HIGH (ref 11.5–15.5)
WBC: 6.8 10*3/uL (ref 4.0–10.5)
nRBC: 0 % (ref 0.0–0.2)

## 2021-05-17 LAB — IRON AND TIBC
Iron: 21 ug/dL — ABNORMAL LOW (ref 28–170)
Saturation Ratios: 6 % — ABNORMAL LOW (ref 10.4–31.8)
TIBC: 385 ug/dL (ref 250–450)
UIBC: 364 ug/dL

## 2021-05-17 LAB — DIFFERENTIAL
Abs Immature Granulocytes: 0.03 10*3/uL (ref 0.00–0.07)
Basophils Absolute: 0 10*3/uL (ref 0.0–0.1)
Basophils Relative: 0 %
Eosinophils Absolute: 0.1 10*3/uL (ref 0.0–0.5)
Eosinophils Relative: 2 %
Immature Granulocytes: 0 %
Lymphocytes Relative: 15 %
Lymphs Abs: 1 10*3/uL (ref 0.7–4.0)
Monocytes Absolute: 0.4 10*3/uL (ref 0.1–1.0)
Monocytes Relative: 6 %
Neutro Abs: 5.2 10*3/uL (ref 1.7–7.7)
Neutrophils Relative %: 77 %

## 2021-05-17 LAB — TROPONIN I (HIGH SENSITIVITY): Troponin I (High Sensitivity): 27 ng/L — ABNORMAL HIGH (ref <18)

## 2021-05-17 LAB — PROCALCITONIN: Procalcitonin: 0.16 ng/mL

## 2021-05-17 LAB — RESP PANEL BY RT-PCR (FLU A&B, COVID) ARPGX2
Influenza A by PCR: NEGATIVE
Influenza B by PCR: NEGATIVE
SARS Coronavirus 2 by RT PCR: NEGATIVE

## 2021-05-17 LAB — PROTIME-INR
INR: 1 (ref 0.8–1.2)
Prothrombin Time: 13.5 seconds (ref 11.4–15.2)

## 2021-05-17 LAB — APTT: aPTT: 27 seconds (ref 24–36)

## 2021-05-17 LAB — MAGNESIUM: Magnesium: 1 mg/dL — ABNORMAL LOW (ref 1.7–2.4)

## 2021-05-17 LAB — GLUCOSE, CAPILLARY: Glucose-Capillary: 116 mg/dL — ABNORMAL HIGH (ref 70–99)

## 2021-05-17 LAB — BRAIN NATRIURETIC PEPTIDE: B Natriuretic Peptide: 272.7 pg/mL — ABNORMAL HIGH (ref 0.0–100.0)

## 2021-05-17 IMAGING — CT CT HEAD W/O CM
4 series · 15 of 47 positions shown, 17 images · non-contrast
Comparison: None.

CLINICAL DATA: Left-sided weakness

EXAM:
CT HEAD WITHOUT CONTRAST
TECHNIQUE: Contiguous axial images were obtained from the base of the skull
through the vertex without intravenous contrast.

[Series 2: head bone · axial · 0.42mm/px · z∈[-101,-87]mm · 2 of 73 slices shown]
[im 8/73  bone]
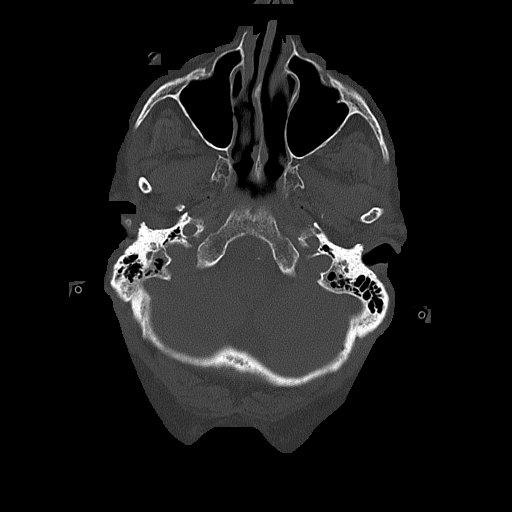
[im 15/73  bone]
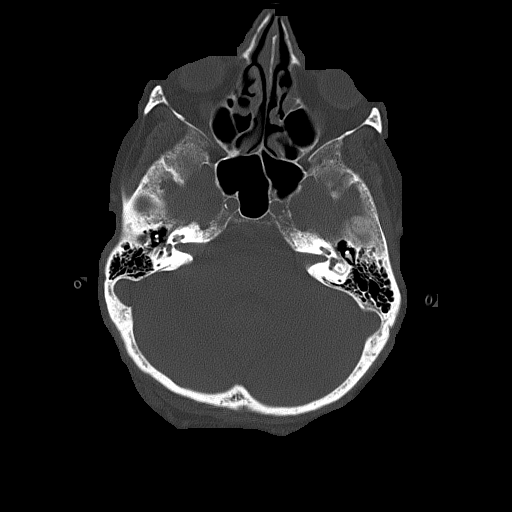

[Series 3: head wo · axial · 0.42mm/px · z∈[-100,+5]mm · 7 of 29 slices shown, 9 images]
[im 4/29  brain]
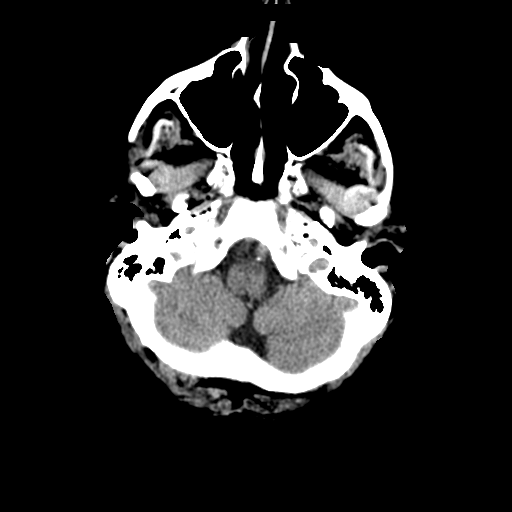
[im 4/29  bone]
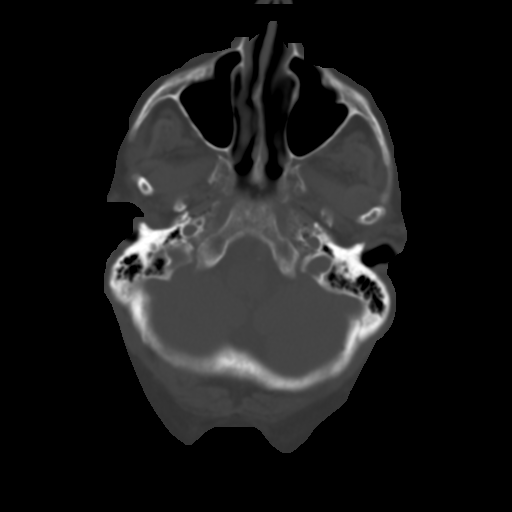
[im 8/29  brain]
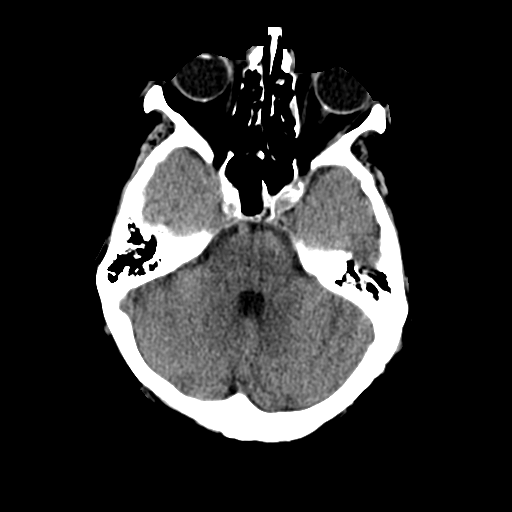
[im 11/29  brain]
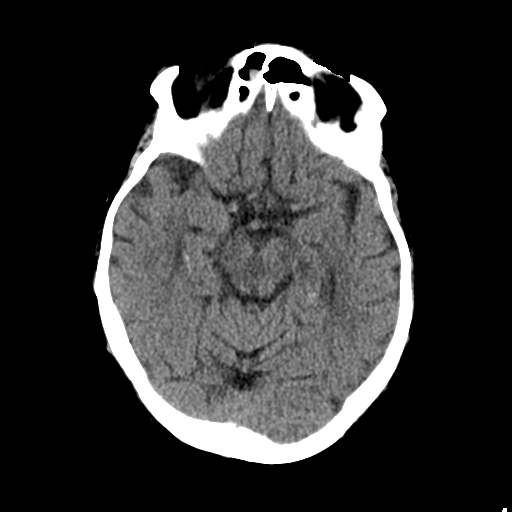
[im 15/29  brain]
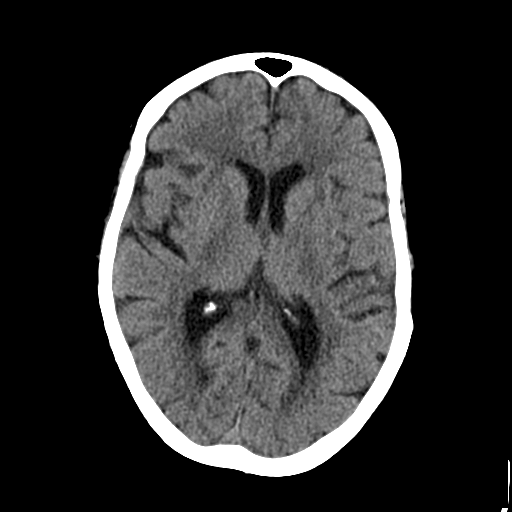
[im 18/29  brain]
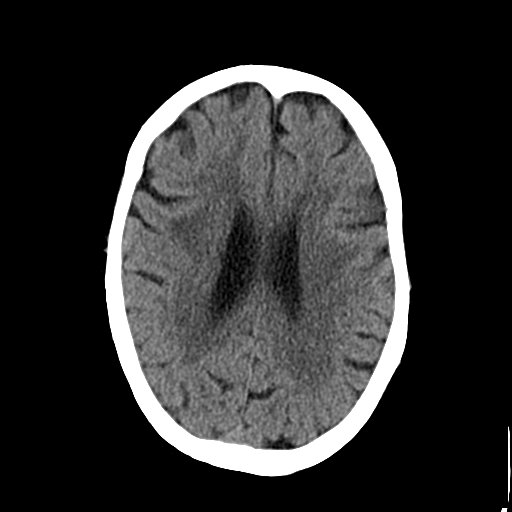
[im 18/29  bone]
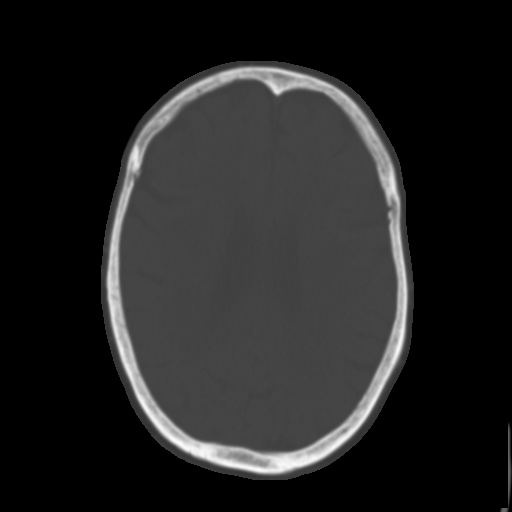
[im 22/29  brain]
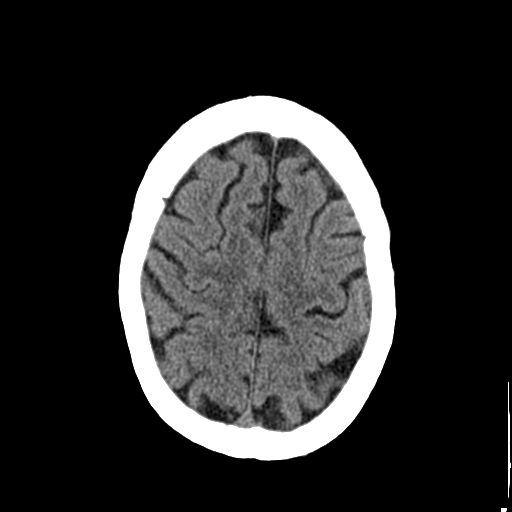
[im 25/29  brain]
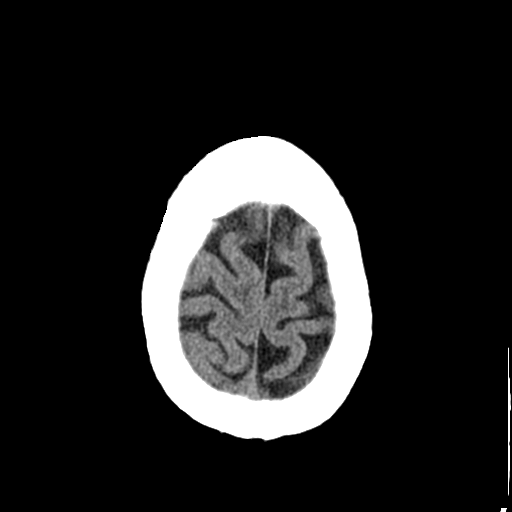

[Series 4: coronal soft tissue · coronal · 0.27mm/px · 3 of 70 slices shown]
[im 24/70  brain]
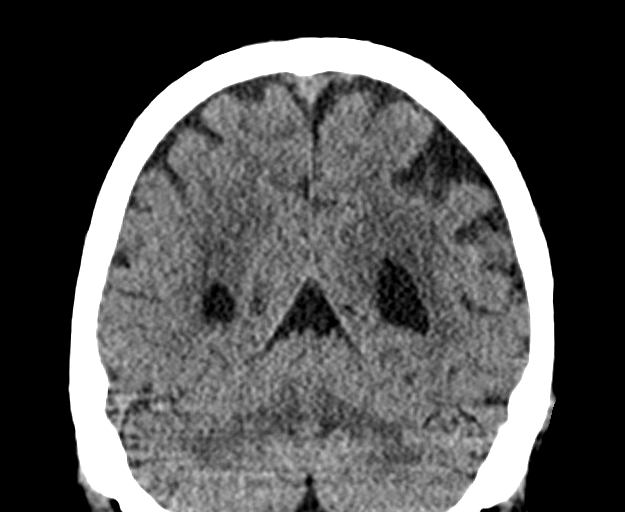
[im 31/70  brain]
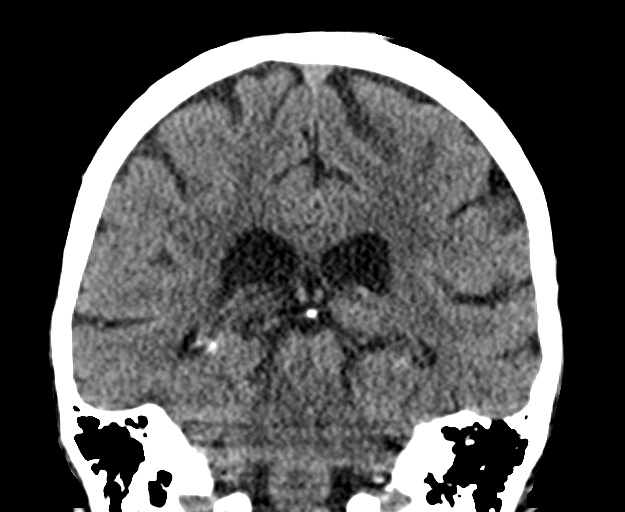
[im 39/70  brain]
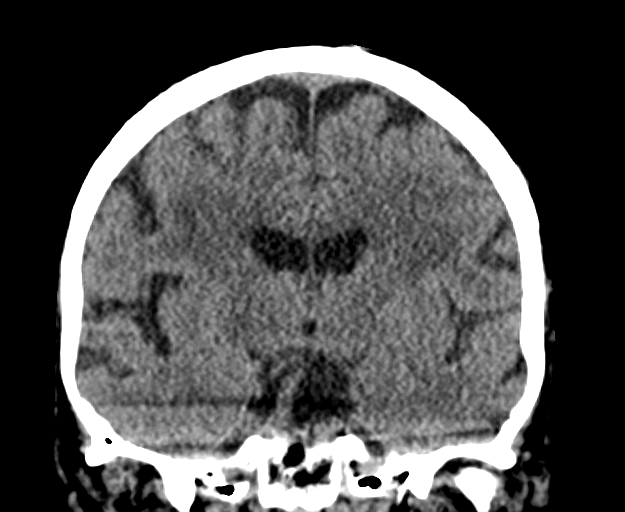

[Series 5: sagittal soft tissue · sagittal · 0.27mm/px · 3 of 56 slices shown]
[im 19/56  brain]
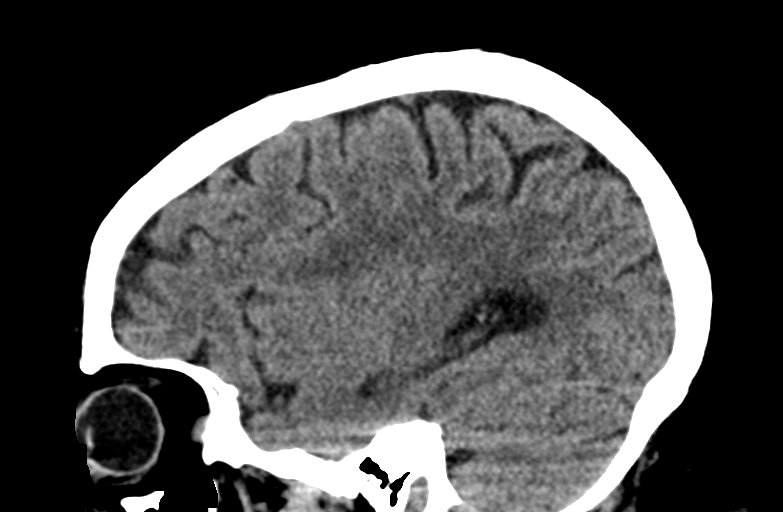
[im 28/56  brain]
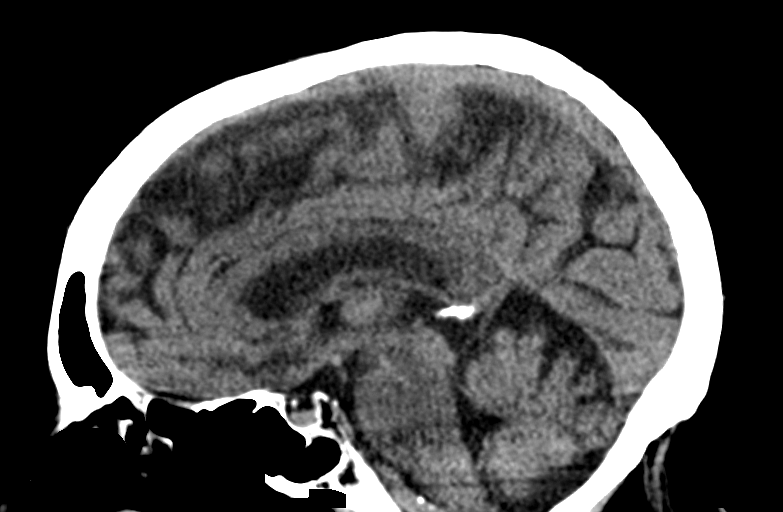
[im 37/56  brain]
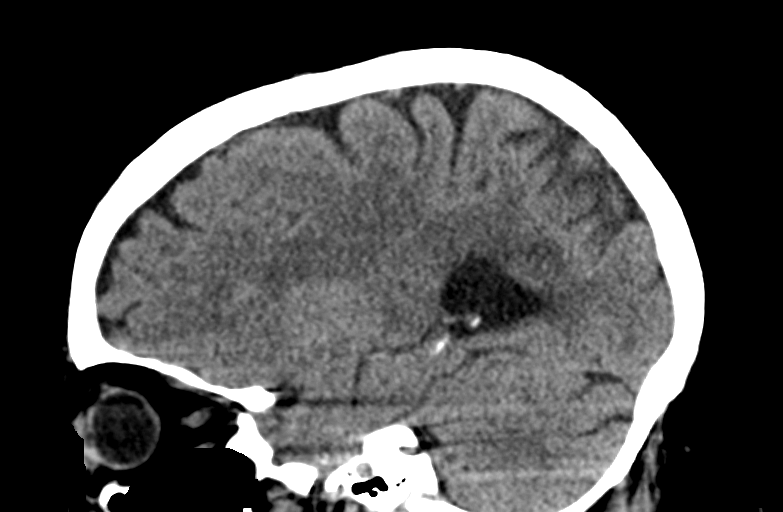

[15 of 47 positions shown; findings below may reference images not displayed]

FINDINGS: Brain: There are no signs of bleeding within the cranium. Ventricles
are not dilated. Cortical sulci are prominent. There is decreased
density in the periventricular and subcortical white matter.
Calcifications are seen in the basal ganglia.

Vascular: There are scattered arterial calcifications.

Skull: Unremarkable.

Sinuses/Orbits: There is mucosal thickening in the frontal and
ethmoid sinuses. There are small calcifications in the region of
optic discs in both optic globes suggesting possible optic disc
drusen.

Other: None
IMPRESSION: No acute intracranial findings are seen in noncontrast CT brain.
Atrophy. Small-vessel disease.

Chronic sinusitis.

## 2021-05-17 IMAGING — MR MR HEAD W/O CM
13 series · 48 of 48 positions shown · non-contrast
Comparison: Same day CT head.

CLINICAL DATA: Neuro deficit, acute, stroke suspected

EXAM:
MRI HEAD WITHOUT CONTRAST
TECHNIQUE: Multiplanar, multiecho pulse sequences of the brain and surrounding
structures were obtained without intravenous contrast.

[Series 5: ax dwi_tracew · axial · 3.0mm · 1.80mm/px · z∈[-106,+49]mm · 2 of 48 slices shown]
[im 1/48]
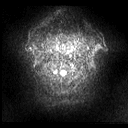
[im 48/48]
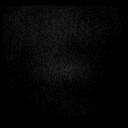

[Series 6: ax dwi_adc · axial · 3.0mm · 1.80mm/px · z∈[-106,+45]mm · 3 of 45 slices shown]
[im 1/45]
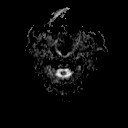
[im 23/45]
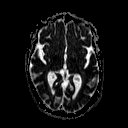
[im 45/45]
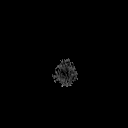

[Series 7: cor dwi_tracew · coronal · 5.0mm · 1.80mm/px · 3 of 38 slices shown]
[im 1/38]
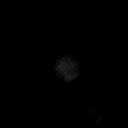
[im 19/38]
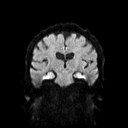
[im 38/38]
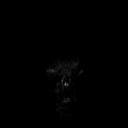

[Series 8: cor dwi_adc · coronal · 5.0mm · 1.80mm/px · 3 of 38 slices shown]
[im 1/38]
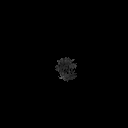
[im 19/38]
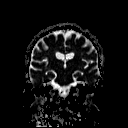
[im 38/38]
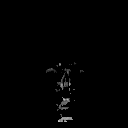

[Series 9: T1 · sagittal · 5.0mm · 0.62mm/px · 2 of 22 slices shown (1 of 2)]
[im 1/22]
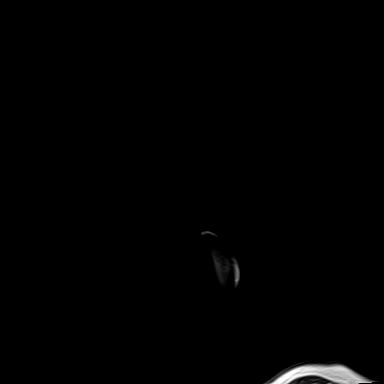
[im 22/22]
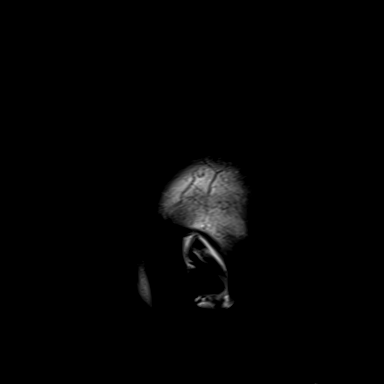

[Series 10: T2 · axial · 5.0mm · 0.86mm/px · z∈[-97,+47]mm · 2 of 25 slices shown (1 of 2)]
[im 1/25]
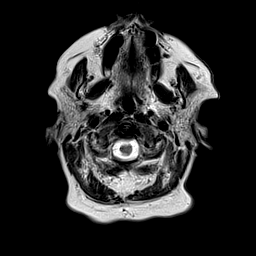
[im 25/25]
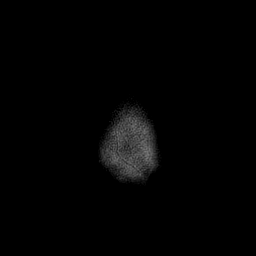

[Series 11: mag_images · axial · 3.0mm · 0.90mm/px · z∈[-101,+52]mm · 4 of 52 slices shown]
[im 1/52]
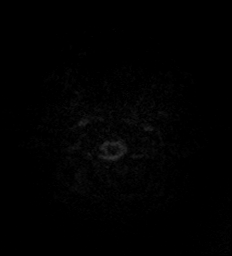
[im 18/52]
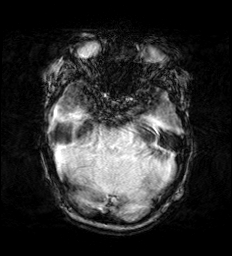
[im 35/52]
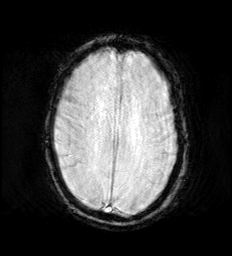
[im 52/52]
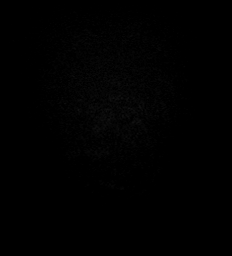

[Series 12: pha_images · axial · 3.0mm · 0.90mm/px · z∈[-98,+52]mm · 4 of 50 slices shown]
[im 1/50]
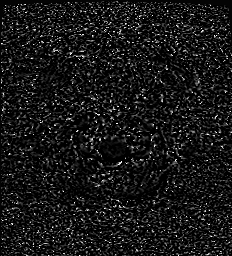
[im 17/50]
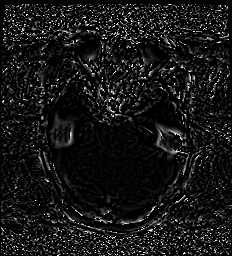
[im 33/50]
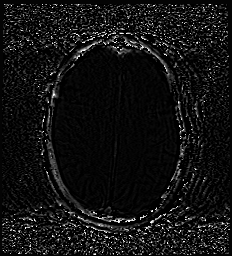
[im 50/50]
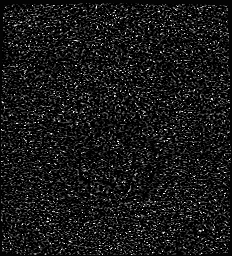

[Series 13: swi_images · axial · 3.0mm · 0.90mm/px · z∈[-101,+52]mm · 4 of 52 slices shown]
[im 1/52]
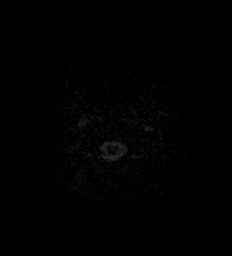
[im 18/52]
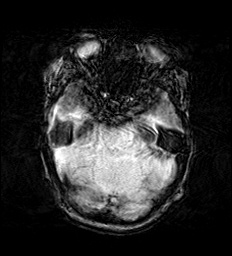
[im 35/52]
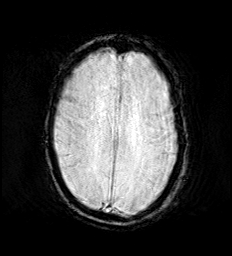
[im 52/52]
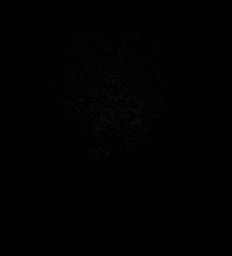

[Series 14: mip_images(sw) · axial · 24.0mm · 0.90mm/px · z∈[-90,+41]mm · 3 of 45 slices shown]
[im 1/45]
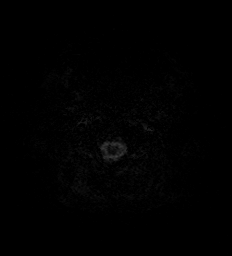
[im 23/45]
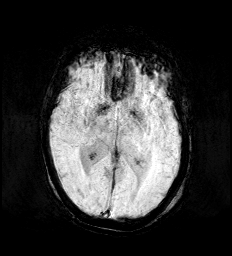
[im 45/45]
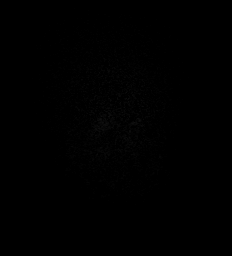

[Series 15: FLAIR · axial · 3.0mm · 0.69mm/px · z∈[-106,+56]mm · 4 of 55 slices shown]
[im 1/55]
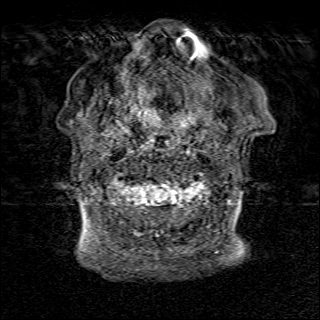
[im 19/55]
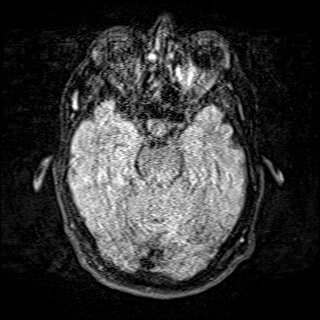
[im 37/55]
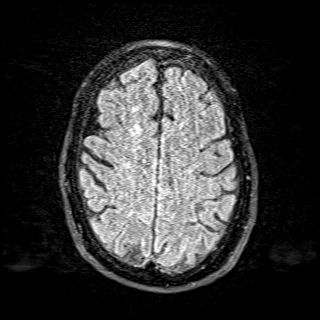
[im 55/55]
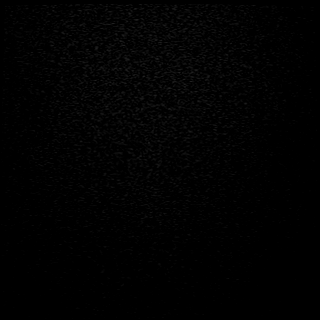

[Series 16: T1 · axial · 1.0mm · 0.98mm/px · z∈[-96,+62]mm · 12 of 160 slices shown (2 of 2)]
[im 1/160]
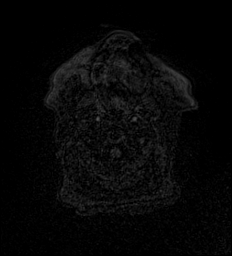
[im 15/160]
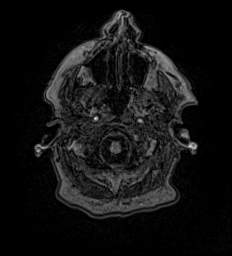
[im 29/160]
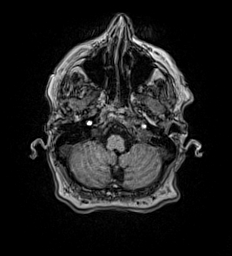
[im 44/160]
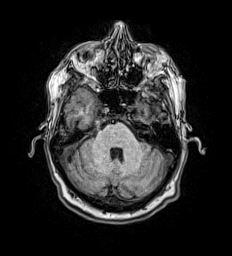
[im 58/160]
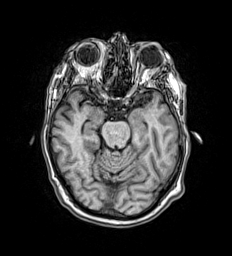
[im 73/160]
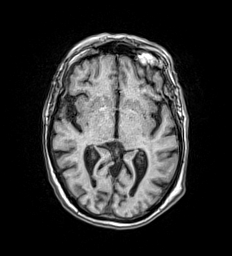
[im 87/160]
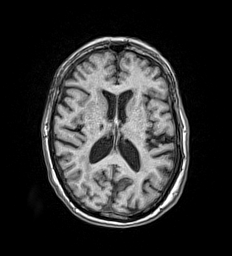
[im 102/160]
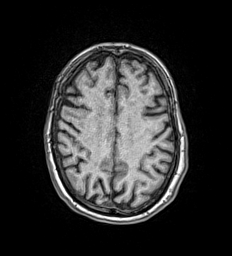
[im 116/160]
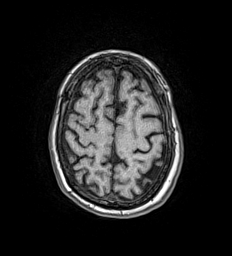
[im 131/160]
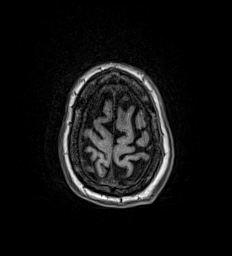
[im 145/160]
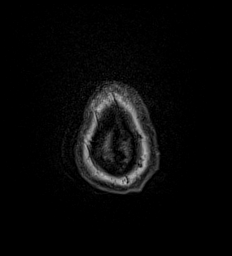
[im 160/160]
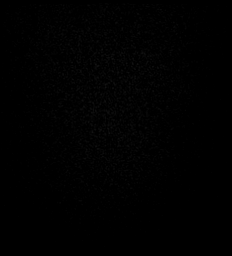

[Series 17: T2 · coronal · 5.0mm · 0.86mm/px · 2 of 29 slices shown (2 of 2)]
[im 1/29]
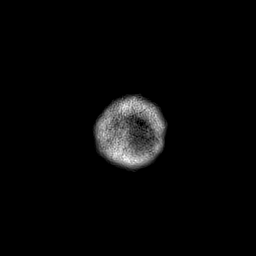
[im 29/29]
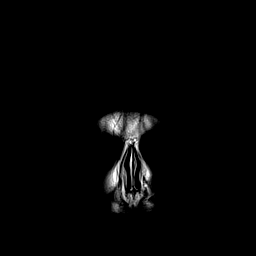

[48 of 48 positions shown; findings below may reference images not displayed]

FINDINGS: Mildly motion limited study.

Brain: Acute infarct in the posterior limb of the right internal
capsule. Mild associated edema without mass effect. Moderate
additional scattered T2/FLAIR hyperintensities within the white
matter, nonspecific but compatible with chronic microvascular
ischemic disease. No evidence of acute hemorrhage, hydrocephalus,
mass lesion, midline shift, or extra-axial fluid collection

Vascular: Major arterial flow voids are maintained at the skull
base.

Skull and upper cervical spine: Normal marrow signal.

Sinuses/Orbits: Mild paranasal sinus mucosal thickening.
Unremarkable orbits.

Other: No mastoid effusions.
IMPRESSION: 1. Acute infarct in the posterior limb of the right internal
capsule. Mild associated edema without mass effect.
2. Moderate chronic microvascular ischemic disease.

## 2021-05-17 IMAGING — CT CT ANGIO CHEST
2 of 7 series · 18 of 46 positions shown · IV contrast (APPLIED)
Comparison: [DATE]

CLINICAL DATA: Left-sided weakness, fatigue

EXAM:
CT ANGIOGRAPHY CHEST WITH CONTRAST
TECHNIQUE: Multidetector CT imaging of the chest was performed using the
standard protocol during bolus administration of intravenous
contrast. Multiplanar CT image reconstructions and MIPs were
obtained to evaluate the vascular anatomy.
CONTRAST:  75mL OMNIPAQUE IOHEXOL 350 MG/ML SOLN

[Series 5: thins · axial · 0.65mm/px · z∈[-237,+8]mm · 15 of 342 slices shown]
[im 18/342  lung]
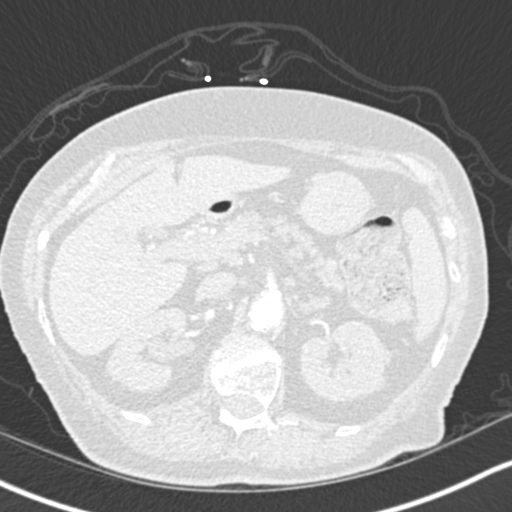
[im 35/342  soft-tissue]
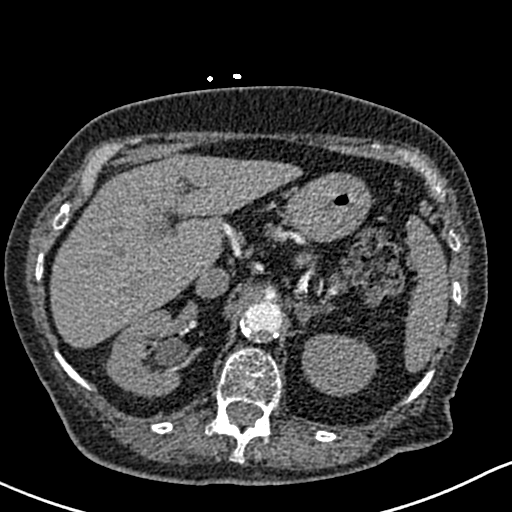
[im 69/342  lung]
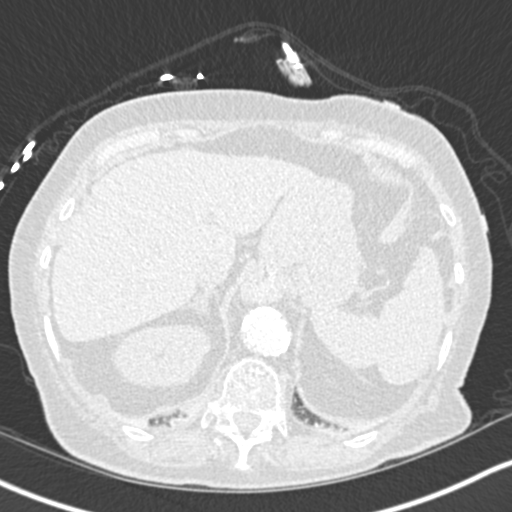
[im 86/342  soft-tissue]
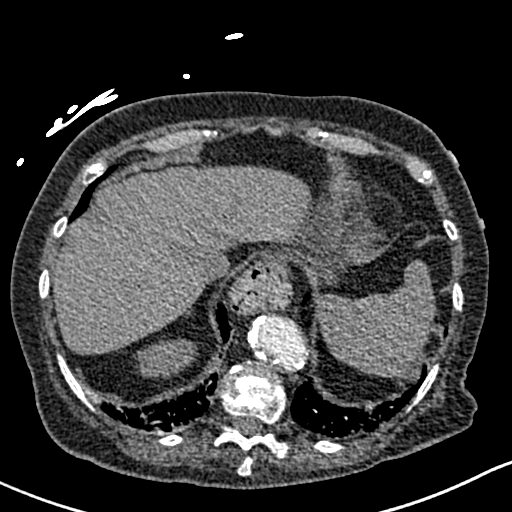
[im 103/342  lung]
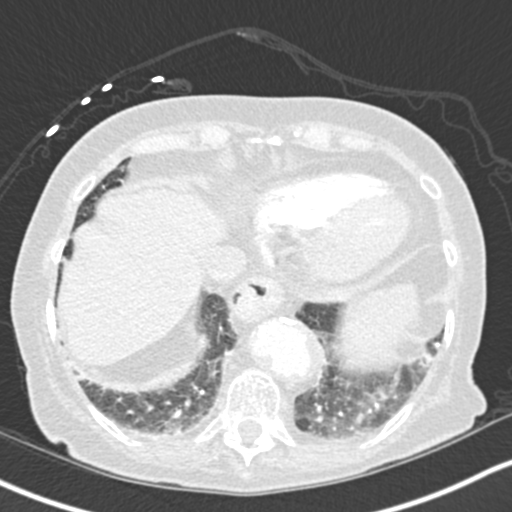
[im 120/342  soft-tissue]
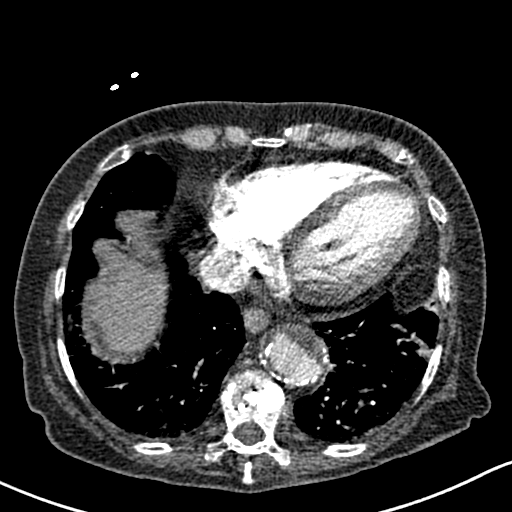
[im 154/342  lung]
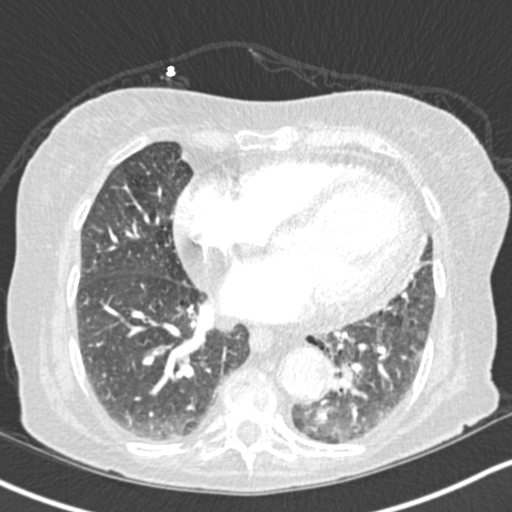
[im 171/342  soft-tissue]
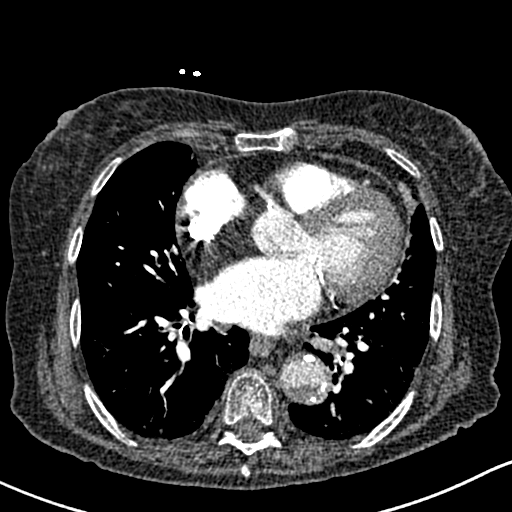
[im 188/342  lung]
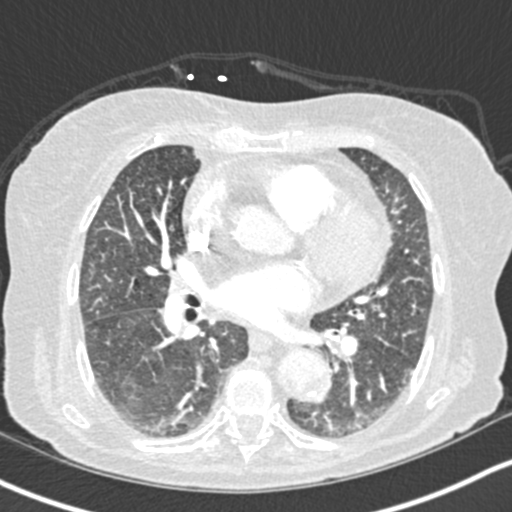
[im 222/342  soft-tissue]
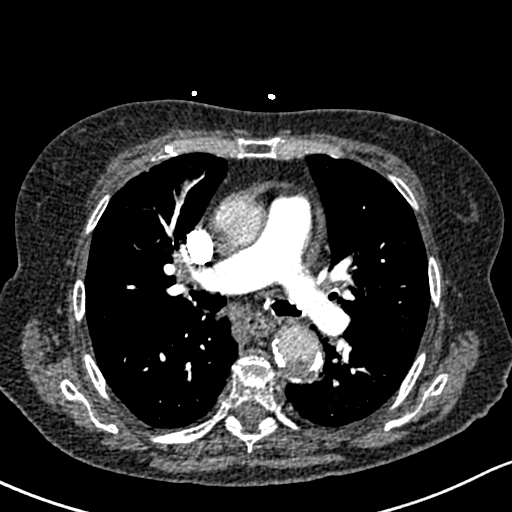
[im 239/342  lung]
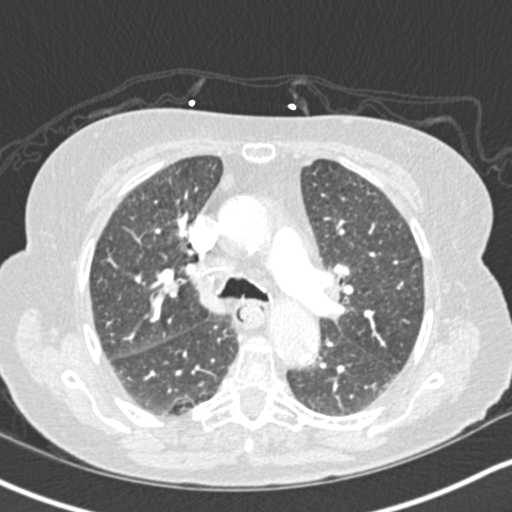
[im 256/342  soft-tissue]
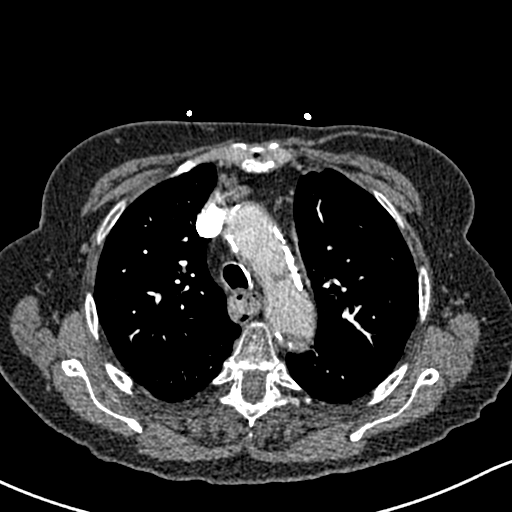
[im 273/342  lung]
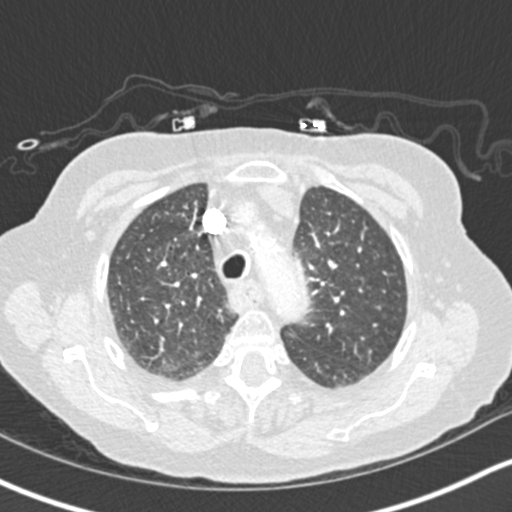
[im 307/342  soft-tissue]
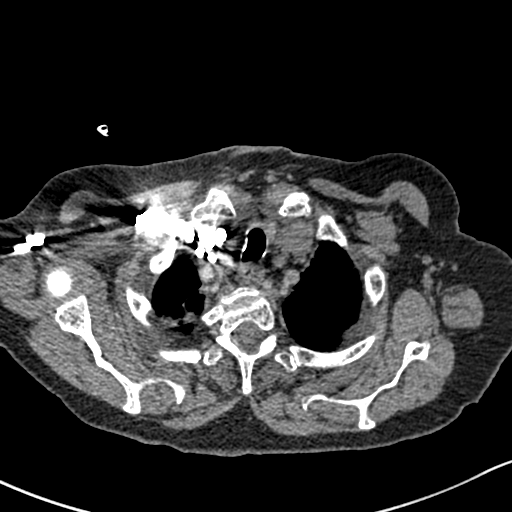
[im 324/342  lung]
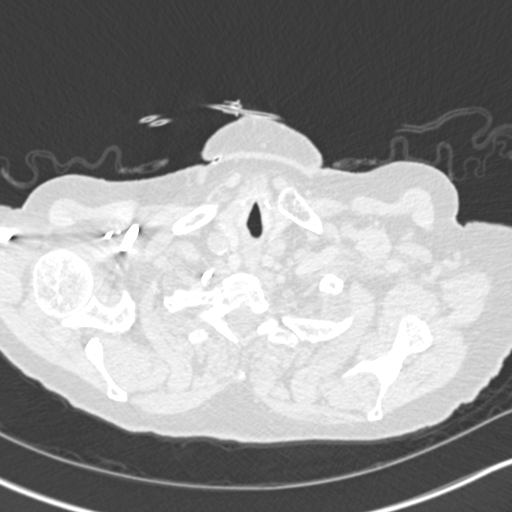

[Series 7: coronal mpr · coronal · 0.54mm/px · 3 of 91 slices shown]
[im 23/91  soft-tissue]
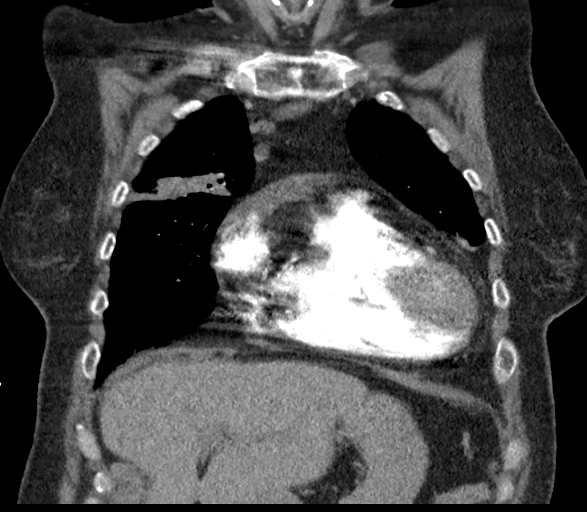
[im 46/91  soft-tissue]
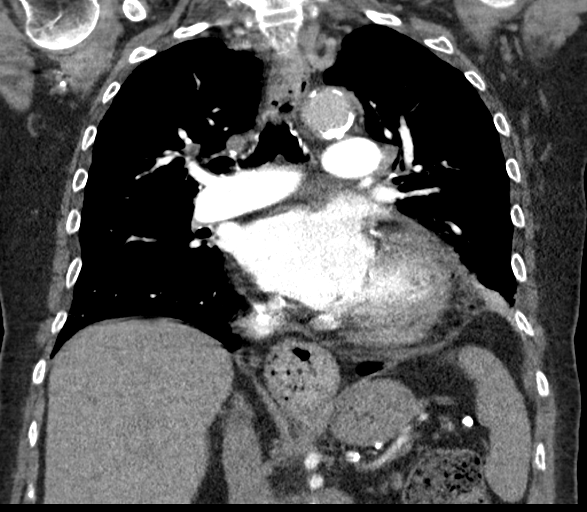
[im 68/91  soft-tissue]
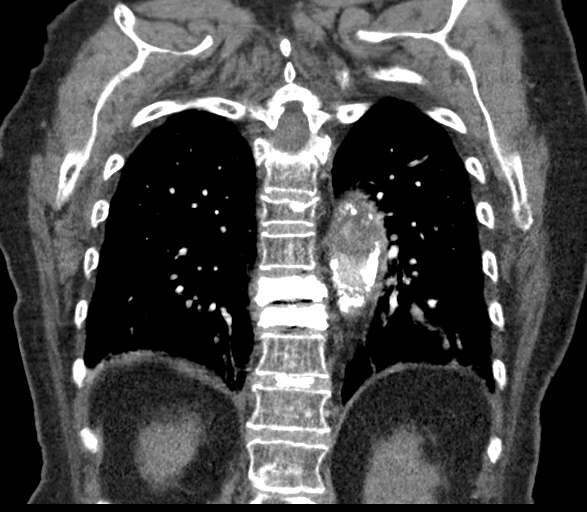

[18 of 46 positions shown; findings below may reference images not displayed]

FINDINGS: Cardiovascular: Mild cardiomegaly. Trace pericardial effusion. The
RV is nondilated. Satisfactory opacification of pulmonary arteries
noted, and there is no evidence of pulmonary emboli. Coronary
calcifications. Fair contrast opacification of the thoracic aorta.
No dissection or stenosis. Atheromatous nonaneurysmal ascending
segment. Classic 3 vessel brachiocephalic arterial origin anatomy
from the arch without proximal stenosis. Coarse calcified plaque in
the arch and did descend distal descending thoracic aorta. Stable
eccentric mural thrombus in the distal arch and proximal and mid
descending thoracic aorta. 4 cm fusiform aneurysmal dilatation of
the distal descending segment, with eccentric nonocclusive mural
thrombus. Visualized proximal abdominal aorta is mildly
atheromatous, normal in caliber.

Mediastinum/Nodes: Small hiatal hernia.  No mass or adenopathy.

Lungs/Pleura: No pleural effusion. Platelike atelectasis or
consolidation in the anterior right upper lobe.

Upper Abdomen: Cholecystectomy clips. Small hiatal hernia. No acute
findings.

Musculoskeletal: Previous cement augmentation of T9 and T10
vertebral bodies. Stable compression deformities of T6 and T7. No
acute fracture.

Review of the MIP images confirms the above findings.
IMPRESSION: 1. Negative for acute PE or thoracic aortic dissection.
2. Extensive eccentric mural thrombus in the descending thoracic
aorta, representing potential source of distal emboli.
3. 4 cm fusiform aneurysm of the distal descending thoracic aorta.
Recommend semi-annual imaging followup by CTA or MRA and referral to
cardiothoracic surgery if not already obtained. This recommendation
follows [TK] ACCF/AHA/AATS/ACR/ASA/SCA/MEESAN/MEESAN/MEESAN/MEESAN Guidelines
for the Diagnosis and Management of Patients With Thoracic Aortic
Disease. Circulation. [TK]; 121: [id number]
4. Platelike atelectasis/consolidation in the in the anterior right
upper lobe obscuring the previously noted nodule, presumably
representing radiation change.

## 2021-05-17 MED ORDER — ONDANSETRON HCL 4 MG/2ML IJ SOLN: 4.0000 mg | Freq: Four times a day (QID) | INTRAMUSCULAR | Status: DC | PRN

## 2021-05-17 MED ORDER — ASPIRIN 325 MG PO TABS
650.0000 mg | ORAL_TABLET | Freq: Once | ORAL | Status: AC
  Administered 2021-05-17: 650 mg via ORAL
  Filled 2021-05-17 (×2): qty 2

## 2021-05-17 MED ORDER — LOPERAMIDE HCL 2 MG PO CAPS
2.0000 mg | ORAL_CAPSULE | ORAL | Status: DC | PRN
  Administered 2021-05-18: 2 mg via ORAL
  Filled 2021-05-17: qty 1

## 2021-05-17 MED ORDER — SODIUM CHLORIDE 0.9 % IV SOLN
500.0000 mg | Freq: Once | INTRAVENOUS | Status: DC
  Administered 2021-05-17: 18:00:00 500 mg via INTRAVENOUS
  Filled 2021-05-17: qty 5

## 2021-05-17 MED ORDER — POTASSIUM CHLORIDE 10 MEQ/100ML IV SOLN
10.0000 meq | INTRAVENOUS | Status: AC
  Administered 2021-05-17 (×2): 10 meq via INTRAVENOUS
  Filled 2021-05-17 (×2): qty 100

## 2021-05-17 MED ORDER — CLOPIDOGREL BISULFATE 75 MG PO TABS
75.0000 mg | ORAL_TABLET | Freq: Every day | ORAL | Status: DC
  Administered 2021-05-18 – 2021-05-20 (×3): 75 mg via ORAL
  Filled 2021-05-17 (×3): qty 1

## 2021-05-17 MED ORDER — IOHEXOL 350 MG/ML SOLN
75.0000 mL | Freq: Once | INTRAVENOUS | Status: AC | PRN
  Administered 2021-05-17: 18:00:00 75 mL via INTRAVENOUS
  Filled 2021-05-17: qty 75

## 2021-05-17 MED ORDER — ENOXAPARIN SODIUM 40 MG/0.4ML IJ SOSY
40.0000 mg | PREFILLED_SYRINGE | INTRAMUSCULAR | Status: DC
  Administered 2021-05-17: 19:00:00 40 mg via SUBCUTANEOUS
  Filled 2021-05-17: qty 0.4

## 2021-05-17 MED ORDER — ACETAMINOPHEN 500 MG PO TABS
1000.0000 mg | ORAL_TABLET | Freq: Three times a day (TID) | ORAL | Status: DC | PRN
  Administered 2021-05-18 – 2021-05-20 (×3): 1000 mg via ORAL
  Filled 2021-05-17 (×3): qty 2

## 2021-05-17 MED ORDER — CLOPIDOGREL BISULFATE 75 MG PO TABS
300.0000 mg | ORAL_TABLET | Freq: Once | ORAL | Status: AC
  Administered 2021-05-17: 19:00:00 300 mg via ORAL
  Filled 2021-05-17: qty 4

## 2021-05-17 MED ORDER — POTASSIUM CHLORIDE 20 MEQ PO PACK
40.0000 meq | PACK | Freq: Once | ORAL | Status: AC
  Administered 2021-05-17: 22:00:00 40 meq via ORAL
  Filled 2021-05-17: qty 2

## 2021-05-17 MED ORDER — NICOTINE 21 MG/24HR TD PT24
21.0000 mg | MEDICATED_PATCH | Freq: Every day | TRANSDERMAL | Status: DC
  Administered 2021-05-17 – 2021-05-20 (×4): 21 mg via TRANSDERMAL
  Filled 2021-05-17 (×4): qty 1

## 2021-05-17 MED ORDER — ASPIRIN EC 81 MG PO TBEC
81.0000 mg | DELAYED_RELEASE_TABLET | Freq: Every day | ORAL | Status: DC
  Administered 2021-05-18 – 2021-05-20 (×3): 81 mg via ORAL
  Filled 2021-05-17 (×3): qty 1

## 2021-05-17 MED ORDER — SODIUM CHLORIDE 0.9 % IV SOLN
2.0000 g | Freq: Once | INTRAVENOUS | Status: AC
  Administered 2021-05-17: 17:00:00 2 g via INTRAVENOUS
  Filled 2021-05-17: qty 20

## 2021-05-17 MED ORDER — STROKE: EARLY STAGES OF RECOVERY BOOK
Freq: Once | Status: AC
  Administered 2021-05-17: 19:00:00 1

## 2021-05-17 MED ORDER — ATORVASTATIN CALCIUM 20 MG PO TABS
40.0000 mg | ORAL_TABLET | Freq: Every day | ORAL | Status: DC
  Administered 2021-05-18: 10:00:00 40 mg via ORAL
  Filled 2021-05-17: qty 2

## 2021-05-17 MED ORDER — GUAIFENESIN-DM 100-10 MG/5ML PO SYRP: 10.0000 mL | ORAL_SOLUTION | Freq: Four times a day (QID) | ORAL | Status: DC | PRN

## 2021-05-17 MED ORDER — PAROXETINE HCL 30 MG PO TABS
30.0000 mg | ORAL_TABLET | ORAL | Status: DC
  Administered 2021-05-18 – 2021-05-20 (×3): 30 mg via ORAL
  Filled 2021-05-17 (×4): qty 1

## 2021-05-17 MED ORDER — ALPRAZOLAM 0.25 MG PO TABS
0.2500 mg | ORAL_TABLET | Freq: Every day | ORAL | Status: DC | PRN
  Administered 2021-05-18 – 2021-05-19 (×3): 0.25 mg via ORAL
  Filled 2021-05-17 (×3): qty 1

## 2021-05-17 MED ORDER — DOXEPIN HCL 50 MG PO CAPS
50.0000 mg | ORAL_CAPSULE | Freq: Every day | ORAL | Status: DC
  Administered 2021-05-17 – 2021-05-19 (×3): 50 mg via ORAL
  Filled 2021-05-17 (×5): qty 1

## 2021-05-17 MED ORDER — ONDANSETRON 4 MG PO TBDP
4.0000 mg | ORAL_TABLET | Freq: Three times a day (TID) | ORAL | Status: DC | PRN
  Filled 2021-05-17: qty 1

## 2021-05-17 MED ORDER — GABAPENTIN 300 MG PO CAPS
300.0000 mg | ORAL_CAPSULE | Freq: Every day | ORAL | Status: DC
  Administered 2021-05-17 – 2021-05-19 (×3): 300 mg via ORAL
  Filled 2021-05-17 (×3): qty 1

## 2021-05-17 NOTE — ED Provider Notes (Signed)
[facility] Emergency Department Provider Note  ____________________________________________   Event Date/Time   First MD Initiated Contact with Patient 05/17/21 1538     (approximate)  I have reviewed the triage vital signs and the nursing notes.   HISTORY  Chief Complaint Weakness    HPI Marissa Fletcher is a 80 y.o. female with past medical history of COPD, hypertension, diabetes, here with left-sided weakness and fatigue.  Patient states that this morning, she awoke and had significant unilateral weakness in her left arm and leg.  She had difficulty walking due to this.  She states the left side felt heavy.  Denies overt numbness or changes in her sensation.  No vision changes.  She states that since then, she has had significant and near resolution of this weakness.  She does endorse generalized fatigue, however.  She states she is had some sinus congestion and mild sore throat for the last several days as well as a mild, nonproductive cough.  Denies known sick contacts.  No other medical complaints.  She has a history of lung cancer and has received radiation, but states that is currently in remission.  No other complaints.    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   AAA (abdominal aortic aneurysm) without rupture    Type 2 diabetes mellitus with hyperlipidemia ([place])    Back pain 05/10/2020   Diabetes mellitus without complication (HCC)    Essential hypertension    Iron deficiency anemia    Lung nodule seen on imaging study    Depression with anxiety    Tobacco abuse     Past Surgical History:  Procedure  Laterality Date   ABDOMINAL HYSTERECTOMY     CHOLECYSTECTOMY     EYE SURGERY Bilateral    KYPHOPLASTY N/A 07/30/2020   Procedure: T10 Kyphoplasty;  Surgeon: Hessie Knows, MD;  Location: [facility] ORS;  Service: Orthopedics;  Laterality: N/A;  T10   KYPHOPLASTY N/A 08/19/2020   Procedure: T9 YPHOPLASTY;  Surgeon: Hessie Knows, MD;  Location: [facility] ORS;  Service: Orthopedics;  Laterality: N/A;    Prior to Admission medications   Medication Sig Start Date End Date Taking? Authorizing Provider  ALPRAZolam Duanne Moron) 0.25 MG tablet Take 0.25 mg by mouth daily as needed for anxiety. 04/19/20  Yes [provider]  atorvastatin (LIPITOR) 20 MG tablet Take 20 mg by mouth daily. AM 04/19/20 05/17/21 Yes [provider]  diclofenac Sodium (VOLTAREN) 1 % GEL Apply topically as needed. 05/18/20 05/18/21 Yes [provider]  doxepin (SINEQUAN) 25 MG capsule Take 50-75 mg by mouth at bedtime. 04/01/20  Yes [provider]  gabapentin (NEURONTIN) 300 MG capsule Take 300 mg by mouth at bedtime. 02/18/20  Yes [provider]  lisinopril-hydrochlorothiazide (ZESTORETIC) 20-12.5 MG tablet Take 1 tablet by mouth every morning. 02/27/20  Yes [provider]  metFORMIN (GLUCOPHAGE-XR) 500 MG 24 hr tablet Take 500 mg by mouth 2 (two) times daily. 03/08/20  Yes [provider]  PARoxetine (PAXIL) 30 MG tablet Take 30 mg by mouth every morning. 03/08/20  Yes  [provider]  polyethylene glycol (MIRALAX / GLYCOLAX) 17 g packet Take 17 g by mouth daily as needed for moderate constipation. 05/12/20  Yes Wieting, Richard, MD  HYDROcodone-acetaminophen (NORCO/VICODIN) 5-325 MG tablet Take 1 tablet by mouth every 4 (four) hours as needed for severe pain. Patient not taking: Reported on 05/17/2021 07/30/20   Hessie Knows, MD  lidocaine (LIDODERM) 5 % Place 1 patch onto the skin daily. Remove & Discard patch within 12 hours or as directed by MD Patient taking  differently: Place 1 patch onto the skin as needed. Remove & Discard patch within 12 hours or as directed by MD 05/12/20   Loletha Grayer, MD  metaxalone (SKELAXIN) 400 MG tablet Take 400 mg by mouth as needed. Patient not taking: Reported on 05/17/2021 07/21/20   [provider]  nicotine (NICODERM CQ - DOSED IN MG/24 HOURS) 21 mg/24hr patch One 21 mg patch chest wall daily (okay to substitute generic) Patient not taking: Reported on 08/17/2020 05/12/20   Loletha Grayer, MD    Allergies Sulfa antibiotics  Family History  Problem Relation Age of Onset   Dementia Mother    Congestive Heart Failure Mother    Bladder Cancer Father    Heart disease Father    Breast cancer Neg Hx     Social History Social History   Tobacco Use   Smoking status: Every Day    Packs/day: 0.50    Years: 68.00    Pack years: 34.00    Types: Cigarettes   Smokeless tobacco: Never  Vaping Use   Vaping Use: Never used  Substance Use Topics   Alcohol use: Never   Drug use: Never    Review of Systems  Review of Systems  Constitutional:  Positive for fatigue. Negative for fever.  HENT:  Negative for congestion and sore throat.   Eyes:  Negative for visual disturbance.  Respiratory:  Positive for cough. Negative for shortness of breath.   Cardiovascular:  Negative for chest pain.  Gastrointestinal:  Negative for abdominal pain, diarrhea, nausea and vomiting.  Genitourinary:  Negative for flank pain.  Musculoskeletal:  Negative for back pain and neck pain.  Skin:  Negative for rash and wound.  Neurological:  Positive for weakness.  All other systems reviewed and are negative.   ____________________________________________  PHYSICAL EXAM:      VITAL SIGNS: ED Triage Vitals  Enc Vitals Group     BP 05/17/21 0953 132/79     Pulse Rate 05/17/21 0953 94     Resp 05/17/21 0953 20     Temp 05/17/21 0953 98.3 F (36.8 C)     Temp Source 05/17/21 0953 Oral     SpO2 05/17/21 0953 (!) 85 %      Weight 05/17/21 0954 143 lb (64.9 kg)     Height 05/17/21 0954 5\' 2"  (1.575 m)     Head Circumference --      Peak Flow --      Pain Score 05/17/21 0953 0     Pain Loc --      Pain Edu? --      Excl. in [place]? --      Physical Exam Vitals and nursing note reviewed.  Constitutional:      General: She is not in acute distress.    Appearance: She is well-developed.  HENT:     Head: Normocephalic and atraumatic.  Eyes:     Conjunctiva/sclera: Conjunctivae normal.  Cardiovascular:     Rate and Rhythm:  Normal rate and regular rhythm.     Heart sounds: Normal heart sounds. No murmur heard.   No friction rub.  Pulmonary:     Effort: Pulmonary effort is normal. No respiratory distress.     Breath sounds: Rales (Bibasilar) present. No wheezing.  Abdominal:     General: There is no distension.     Palpations: Abdomen is soft.     Tenderness: There is no abdominal tenderness.  Musculoskeletal:     Cervical back: Neck supple.  Skin:    General: Skin is warm.     Capillary Refill: Capillary refill takes less than 2 seconds.  Neurological:     Mental Status: She is alert and oriented to person, place, and time.     Motor: No abnormal muscle tone.     Comments: Strength 5 out of 5 bilateral upper and lower extremities.  Subjective weakness noted in the left arm.  Normal sensation to light touch.  Cranial nerves II through XII intact.      ____________________________________________   LABS (all labs ordered are listed, but only abnormal results are displayed)  Labs Reviewed  CBC - Abnormal; Notable for the following components:      Result Value   Hemoglobin 9.6 (*)    HCT 30.7 (*)    MCV 77.1 (*)    MCH 24.1 (*)    RDW 16.5 (*)    All other components within normal limits  COMPREHENSIVE METABOLIC PANEL - Abnormal; Notable for the following components:   Potassium 2.9 (*)    Glucose, Bld 119 (*)    Calcium 8.0 (*)    Alkaline Phosphatase 132 (*)    All other components  within normal limits  BRAIN NATRIURETIC PEPTIDE - Abnormal; Notable for the following components:   B Natriuretic Peptide 272.7 (*)    All other components within normal limits  CULTURE, BLOOD (ROUTINE X 2)  CULTURE, BLOOD (ROUTINE X 2)  RESP PANEL BY RT-PCR (FLU A&B, COVID) ARPGX2  PROTIME-INR  APTT  DIFFERENTIAL  CBG MONITORING, ED    ____________________________________________  EKG: Normal sinus rhythm with first-degree AV block.  Ventricular 96, PR 224, QRS 70, QTc 42.  No acute ST elevations or depressions. ________________________________________  RADIOLOGY All imaging, including plain films, CT scans, and ultrasounds, independently reviewed by me, and interpretations confirmed via formal radiology reads.  ED MD interpretation:   Chest x-ray: Cardiomegaly, transversely infiltrate in the right perihilar region suggesting atelectasis/pneumonia CT head: No acute intracranial abnormality, chronic sinusitis  Official radiology report(s): DG Chest 2 View  Result Date: 05/17/2021 CLINICAL DATA:  Shortness of breath EXAM: CHEST - 2 VIEW COMPARISON:  Radiographs done on 08/11/2020 and CT done on 02/08/2021 FINDINGS: Transverse diameter of heart is increased. There are no signs of alveolar pulmonary edema. There is transverse linear infiltrate in the right parahilar region. Rest of the lung fields are essentially clear. There is no significant pleural effusion or pneumothorax. There is previous vertebroplasty in the thoracic spine. IMPRESSION: Cardiomegaly. There is transverse linear infiltrate in the right parahilar region suggesting atelectasis/pneumonia. Electronically Signed   By: Elmer Picker M.D.   On: 05/17/2021 10:30   CT HEAD WO CONTRAST  Result Date: 05/17/2021 CLINICAL DATA:  Left-sided weakness EXAM: CT HEAD WITHOUT CONTRAST TECHNIQUE: Contiguous axial images were obtained from the base of the skull through the vertex without intravenous contrast. COMPARISON:  None.  FINDINGS: Brain: There are no signs of bleeding within the cranium. Ventricles are not dilated. Cortical sulci  are prominent. There is decreased density in the periventricular and subcortical white matter. Calcifications are seen in the basal ganglia. Vascular: There are scattered arterial calcifications. Skull: Unremarkable. Sinuses/Orbits: There is mucosal thickening in the frontal and ethmoid sinuses. 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 Other: None IMPRESSION: No acute intracranial findings are seen in noncontrast CT brain. Atrophy. Small-vessel disease. Chronic sinusitis. Electronically Signed   By: Elmer Picker M.D.   On: 05/17/2021 10:55    ____________________________________________  PROCEDURES   Procedure(s) performed (including Critical Care):  .1-3 Lead EKG Interpretation Performed by: Duffy Bruce, MD Authorized by: Duffy Bruce, MD     Interpretation: normal     ECG rate:  80-90s   ECG rate assessment: normal     Rhythm: sinus rhythm     Ectopy: none     Conduction: normal   Comments:     Indication: Weakness  ____________________________________________  INITIAL IMPRESSION / MDM / [place] / ED COURSE  As part of my medical decision making, I reviewed the following data within the [place] notes reviewed and incorporated, Old chart reviewed, Notes from prior ED visits, and [place] Controlled Substance Database       *Marissa Fletcher was evaluated in Emergency Department on 05/17/2021 for the symptoms described in the history of present illness. She was evaluated in the context of the global COVID-19 pandemic, which necessitated consideration that the patient might be at risk for infection with the SARS-CoV-2 virus that causes COVID-19. Institutional protocols and algorithms that pertain to the evaluation of patients at risk for COVID-19 are in  a state of rapid change based on information released by regulatory bodies including the CDC and federal and state organizations. These policies and algorithms were followed during the patient's care in the ED.  Some ED evaluations and interventions may be delayed as a result of limited staffing during the pandemic.*     Medical Decision Making: 80 year old female with past medical history as above here with generalized weakness and transient left-sided arm and leg weakness.  Regarding her unilateral weakness, primary suspicion is TIA versus largely resolving CVA.  Patient has near complete strength and sensation and otherwise reassuring neurological exam on my assessment.  She has no signs of LVO.  She is outside the window for tPA.  CT head obtained, reviewed by me, and shows no acute intracranial abnormality.  Patient will be admitted for MRI and TIA work-up.  My suspicion, however, is that patient's unilateral weakness was more so secondary to transient hypoxia when she was resting.  She has had a mild sinusitis and chest x-ray is concerning for pneumonia, and she was hypoxic here requiring 2 L nasal cannula.  Suspect this likely contributed to her possible transient anoxia and strokelike symptoms.  Will cover empirically for this.  She does have a remote history of breast cancer, and given absence of fever or other significant infectious symptoms, will obtain CT angio to evaluate for both PE as well as recurrence of lung mass.  Admit to medicine.  ____________________________________________  FINAL CLINICAL IMPRESSION(S) / ED DIAGNOSES  Final diagnoses:  Acute respiratory failure with hypoxia (HCC)  Unilateral weakness  Stroke-like symptom     MEDICATIONS GIVEN DURING THIS VISIT:  Medications  azithromycin (ZITHROMAX) 500 mg in sodium chloride 0.9 % 250 mL IVPB (has no administration in time range)  potassium chloride 10 mEq in 100  mL IVPB (has no administration in time range)  cefTRIAXone  (ROCEPHIN) 2 g in sodium chloride 0.9 % 100 mL IVPB (2 g Intravenous New Bag/Given 05/17/21 1644)     ED Discharge Orders     None        Note:  This document was prepared using Dragon voice recognition software and may include unintentional dictation errors.   Duffy Bruce, MD 05/17/21 [phone]

## 2021-05-17 NOTE — H&P (Signed)
History and Physical    AZYRIAH NEVINS EPP:[id number] DOB: [birth year] DOA: 05/17/2021  PCP: Juluis Pitch, MD  Patient coming from: home  I have personally briefly reviewed patient's old medical records in [place]  Chief Complaint: left-sided weakness  HPI: ASHONTI LEANDRO is a 80 y.o. 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   Pt's main complaint is weakness of left arm and left leg when she woke up this morning, causing her to have problem walking.  Weakness improved by the time of arrival.  No trouble speaking.    Pt also reported sinus congestion and cough with sputum production for the past several weeks.  No significant dyspnea.  No fever.  Pt has been a heavy long-term smoker and currently still smokes.  No chest pain, abdominal pain, N/V/D, dysuria.  Has chronic leg swelling.  No hx of trouble swallowing or aspirating.  Pt lives at home by herself.   ED Course: initial vitals: afebrile, pulse 94, BP 132/79, RR 20, sating 85% on room air.  Labs notable for normal WBC, K+ 2.9.  CXR showed "Cardiomegaly. There is transverse linear infiltrate in the right parahilar region suggesting atelectasis/pneumonia."  Pt received ceftriaxone and azithromycin in the ED.  MRI brain resulted after admission which showed "Acute infarct in the posterior limb of the right internal capsule."  Assessment/Plan Principal Problem:   Stroke (cerebrum) ([place]) Active Problems:   Acute hypoxemic respiratory failure (HCC)  # Acute Infarct, POA --woke up with left-sided weakness in arm and leg 2/2 Acute infarct in the posterior limb of the right internal capsule.  Symptoms improved by the time of arrival. Plan: --Neuro consult, Dr. Rory Percy aware, will see pt tomorrow. --load with ASA 650 mg and plavix 300 mg now --ASA 81 mg daily and plavix 75 mg daily starting  tomorrow --lipid profile --increase home statin to Lipitor 40 mg daily --PT/OT (did not order SLP since pt reported no trouble swallowing) --monitor on tele --neuro checks  # Hypoxemic respiratory failure, unclear chronicity  # COPD # Current smoker --O2 sats 85% on presentation, but pt didn't complain of significant dyspnea.   Pt has extensive smoking hx and is a current smoker, and had just completed radiation therapy, so hypoxia may be chronic and developing. --No respiratory distress or wheezing to suggest COPD exacerbation. Plan: --Continue supplemental O2 to keep sats between 88-92%, wean as tolerated --nicotine patch  # PNA, ruled out --No fever, no leukocytosis.  The transverse linear infiltrate in the right parahilar region shown in CXR is more likely atelectasis, or possibly from radiation. --received ceftriaxone and azithromycin x1 in the ED Plan: --obtain procal --will not continue abx unless pt shows more signs of infection or PNA  # Hypokalemia --Monitor and replete PRN  # Microcytic anemia # Hx of iron def --Hgb 9.6  --check iron level  # DM2 --takes only metformin at home.  Last A1c 6.2. --obtain A1c --No need for BG checks and SSI  # HTN --hold home Zestoretic for permissive hypertension.  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   Past Medical History:  Diagnosis Date   Anxiety    Arthritis    Cancer ([place])    COPD (chronic obstructive pulmonary disease) ([place])    Depression    Diabetes mellitus without complication ([place])     History of kidney stones    Hypertension    Thoracic aortic aneurysm     Past Surgical History:  Procedure Laterality Date   ABDOMINAL HYSTERECTOMY     CHOLECYSTECTOMY     EYE SURGERY Bilateral    KYPHOPLASTY N/A 07/30/2020   Procedure: T10 Kyphoplasty;  Surgeon: Hessie Knows, MD;  Location: [facility] ORS;  Service: Orthopedics;  Laterality: N/A;  T10   KYPHOPLASTY N/A 08/19/2020   Procedure: T9 YPHOPLASTY;  Surgeon: Hessie Knows, MD;  Location: [facility] ORS;  Service: Orthopedics;  Laterality: N/A;     reports that she has been smoking cigarettes. She has a 34.00 pack-year smoking history. She has never used smokeless tobacco. She reports that she does not drink alcohol and does not use drugs.  Allergies  Allergen Reactions   Sulfa Antibiotics Swelling    Family History  Problem Relation Age of Onset   Dementia Mother    Congestive Heart Failure Mother    Bladder Cancer Father    Heart disease Father    Breast cancer Neg Hx     Prior to Admission medications   Medication Sig Start Date End Date Taking? Authorizing Provider  ALPRAZolam Duanne Moron) 0.25 MG tablet Take 0.25 mg by mouth daily as needed for anxiety. 04/19/20  Yes [provider]  atorvastatin (LIPITOR) 20 MG tablet Take 20 mg by mouth daily. AM 04/19/20 05/17/21 Yes [provider]  diclofenac Sodium (VOLTAREN) 1 % GEL Apply topically as needed. 05/18/20 05/18/21 Yes [provider]  doxepin (SINEQUAN) 25 MG capsule Take 50-75 mg by mouth at bedtime. 04/01/20  Yes [provider]  gabapentin (NEURONTIN) 300 MG capsule Take 300 mg by mouth at bedtime. 02/18/20  Yes [provider]  lisinopril-hydrochlorothiazide (ZESTORETIC) 20-12.5 MG tablet Take 1 tablet by mouth every morning. 02/27/20  Yes [provider]  metFORMIN (GLUCOPHAGE-XR) 500 MG 24 hr tablet Take 500 mg by mouth 2 (two) times daily. 03/08/20  Yes [provider]  PARoxetine (PAXIL) 30 MG tablet Take 30  mg by mouth every morning. 03/08/20  Yes [provider]  polyethylene glycol (MIRALAX / GLYCOLAX) 17 g packet Take 17 g by mouth daily as needed for moderate constipation. 05/12/20  Yes Wieting, Richard, MD  HYDROcodone-acetaminophen (NORCO/VICODIN) 5-325 MG tablet Take 1 tablet by mouth every 4 (four) hours as needed for severe pain. Patient not taking: Reported on 05/17/2021 07/30/20   Hessie Knows, MD  lidocaine (LIDODERM) 5 % Place 1 patch onto the skin daily. Remove & Discard patch within 12 hours or as directed by MD Patient taking differently: Place 1 patch onto the skin as needed. Remove & Discard patch within 12 hours or as directed by MD 05/12/20   Loletha Grayer, MD  metaxalone (SKELAXIN) 400 MG tablet Take 400 mg by mouth as needed. Patient not taking: Reported on 05/17/2021 07/21/20   [provider]  nicotine (NICODERM CQ - DOSED IN MG/24 HOURS) 21 mg/24hr patch One 21 mg patch chest wall daily (okay to substitute generic) Patient not taking: Reported on 08/17/2020 05/12/20   Loletha Grayer, MD    Physical Exam: Vitals:  05/17/21 0954 05/17/21 0958 05/17/21 1404 05/17/21 1706  BP:   118/66 (!) 122/58  Pulse:   97 81  Resp:   20 (!) 26  Temp:   98.5 F (36.9 C)   TempSrc:   Oral   SpO2:  94% 97% 97%  Weight: 64.9 kg     Height: 5\' 2"  (1.575 m)       Constitutional: NAD, AAOx3 HEENT: conjunctivae and lids normal, EOMI CV: No cyanosis.   RESP: normal respiratory effort, on 2L Extremities: non-pitting mild edema in BLE SKIN: warm, dry Neuro: II - XII grossly intact.   Psych: Normal mood and affect.  Appropriate judgement and reason   Labs on Admission: I have personally reviewed following labs and imaging studies  CBC: Recent Labs  Lab 05/17/21 0956  WBC 6.8  NEUTROABS 5.2  HGB 9.6*  HCT 30.7*  MCV 77.1*  PLT 035   Basic Metabolic Panel: Recent Labs  Lab 05/17/21 0956  NA 137  K 2.9*  CL 99  CO2 29  GLUCOSE 119*  BUN 9   CREATININE 0.63  CALCIUM 8.0*   GFR: Estimated Creatinine Clearance: 48.8 mL/min (by C-G formula based on SCr of 0.63 mg/dL). Liver Function Tests: Recent Labs  Lab 05/17/21 0956  AST 19  ALT 12  ALKPHOS 132*  BILITOT 0.5  PROT 7.0  ALBUMIN 3.5   No results for input(s): LIPASE, AMYLASE in the last 168 hours. No results for input(s): AMMONIA in the last 168 hours. Coagulation Profile: Recent Labs  Lab 05/17/21 0956  INR 1.0   Cardiac Enzymes: No results for input(s): CKTOTAL, CKMB, CKMBINDEX, TROPONINI in the last 168 hours. BNP (last 3 results) No results for input(s): PROBNP in the last 8760 hours. HbA1C: No results for input(s): HGBA1C in the last 72 hours. CBG: No results for input(s): GLUCAP in the last 168 hours. Lipid Profile: No results for input(s): CHOL, HDL, LDLCALC, TRIG, CHOLHDL, LDLDIRECT in the last 72 hours. Thyroid Function Tests: No results for input(s): TSH, T4TOTAL, FREET4, T3FREE, THYROIDAB in the last 72 hours. Anemia Panel: No results for input(s): VITAMINB12, FOLATE, FERRITIN, TIBC, IRON, RETICCTPCT in the last 72 hours. Urine analysis:    Component Value Date/Time   COLORURINE YELLOW (A) 05/10/2020 1352   APPEARANCEUR HAZY (A) 05/10/2020 1352   LABSPEC 1.013 05/10/2020 1352   PHURINE 6.0 05/10/2020 1352   GLUCOSEU NEGATIVE 05/10/2020 1352   HGBUR NEGATIVE 05/10/2020 1352   BILIRUBINUR NEGATIVE 05/10/2020 1352   KETONESUR NEGATIVE 05/10/2020 1352   PROTEINUR NEGATIVE 05/10/2020 1352   NITRITE NEGATIVE 05/10/2020 1352   LEUKOCYTESUR NEGATIVE 05/10/2020 1352    Radiological Exams on Admission: DG Chest 2 View  Result Date: 05/17/2021 CLINICAL DATA:  Shortness of breath EXAM: CHEST - 2 VIEW COMPARISON:  Radiographs done on 08/11/2020 and CT done on 02/08/2021 FINDINGS: Transverse diameter of heart is increased. There are no signs of alveolar pulmonary edema. There is transverse linear infiltrate in the right parahilar region. Rest of  the lung fields are essentially clear. There is no significant pleural effusion or pneumothorax. There is previous vertebroplasty in the thoracic spine. IMPRESSION: Cardiomegaly. There is transverse linear infiltrate in the right parahilar region suggesting atelectasis/pneumonia. Electronically Signed   By: Elmer Picker M.D.   On: 05/17/2021 10:30   CT HEAD WO CONTRAST  Result Date: 05/17/2021 CLINICAL DATA:  Left-sided weakness EXAM: CT HEAD WITHOUT CONTRAST TECHNIQUE: Contiguous axial images were obtained from the base of the skull through the vertex without  intravenous contrast. COMPARISON:  None. FINDINGS: Brain: There are no signs of bleeding within the cranium. Ventricles are not dilated. Cortical sulci are prominent. There is decreased density in the periventricular and subcortical white matter. Calcifications are seen in the basal ganglia. Vascular: There are scattered arterial calcifications. Skull: Unremarkable. Sinuses/Orbits: There is mucosal thickening in the frontal and ethmoid sinuses. 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 Other: None IMPRESSION: No acute intracranial findings are seen in noncontrast CT brain. Atrophy. Small-vessel disease. Chronic sinusitis. Electronically Signed   By: Elmer Picker M.D.   On: 05/17/2021 10:55   MR BRAIN WO CONTRAST  Result Date: 05/17/2021 CLINICAL DATA:  Neuro deficit, acute, stroke suspected EXAM: MRI HEAD WITHOUT CONTRAST TECHNIQUE: Multiplanar, multiecho pulse sequences of the brain and surrounding structures were obtained without intravenous contrast. COMPARISON:  Same day CT head. FINDINGS: Mildly motion limited study. 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 Mild associated edema without mass effect. Moderate additional scattered T2/FLAIR hyperintensities within the white matter, nonspecific but compatible with chronic  microvascular ischemic disease. No evidence of acute hemorrhage, hydrocephalus, mass lesion, midline shift, or extra-axial fluid collection Vascular: Major arterial flow voids are maintained at the skull base. Skull and upper cervical spine: Normal marrow signal. Sinuses/Orbits: Mild paranasal sinus mucosal thickening. Unremarkable orbits. Other: No mastoid effusions. IMPRESSION: 1. 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 Mild associated edema without mass effect. 2. Moderate chronic microvascular ischemic disease. Electronically Signed   By: Margaretha Sheffield M.D.   On: 05/17/2021 17:45      Enzo Bi MD [facility]  If 7PM-7AM, please contact night-coverage 05/17/2021, 6:38 PM

## 2021-05-17 NOTE — ED Notes (Signed)
Pt at MRI

## 2021-05-17 NOTE — ED Triage Notes (Signed)
Pt to ED [facility] From home for weakness to left side. LKW 0300 when went to bed. States woke up this morning and tried to walk, had to hold onto bed and almost fell. Equal grip and strength, clear speech, face symmetrical, able to move all extremities, equal sensation Reports shob  Placed on 2 l [place] for RA Spo2 85%

## 2021-05-18 ENCOUNTER — Inpatient Hospital Stay: Payer: Medicare Other

## 2021-05-18 DIAGNOSIS — E119 Type 2 diabetes mellitus without complications: Secondary | ICD-10-CM

## 2021-05-18 DIAGNOSIS — I7411 Embolism and thrombosis of thoracic aorta: Secondary | ICD-10-CM

## 2021-05-18 DIAGNOSIS — I700 Atherosclerosis of aorta: Secondary | ICD-10-CM

## 2021-05-18 LAB — BASIC METABOLIC PANEL
Anion gap: 8 (ref 5–15)
BUN: 8 mg/dL (ref 8–23)
CO2: 28 mmol/L (ref 22–32)
Calcium: 7.3 mg/dL — ABNORMAL LOW (ref 8.9–10.3)
Chloride: 102 mmol/L (ref 98–111)
Creatinine, Ser: 0.61 mg/dL (ref 0.44–1.00)
GFR, Estimated: >60 mL/min (ref >60)
Glucose, Bld: 107 mg/dL — ABNORMAL HIGH (ref 70–99)
Potassium: 2.7 mmol/L — CL (ref 3.5–5.1)
Sodium: 138 mmol/L (ref 135–145)

## 2021-05-18 LAB — GLUCOSE, CAPILLARY: Glucose-Capillary: 113 mg/dL — ABNORMAL HIGH (ref 70–99)

## 2021-05-18 LAB — ECHOCARDIOGRAM COMPLETE
AV Peak grad: 8.3 mmHg
Ao pk vel: 1.44 m/s
Area-P 1/2: 3.27 cm2
Height: 62 in
S' Lateral: 4.2 cm
Weight: 2288 oz

## 2021-05-18 LAB — HEMOGLOBIN A1C
Hgb A1c MFr Bld: 6.2 % — ABNORMAL HIGH (ref 4.8–5.6)
Mean Plasma Glucose: 131.24 mg/dL

## 2021-05-18 LAB — CBC
HCT: 27.6 % — ABNORMAL LOW (ref 36.0–46.0)
Hemoglobin: 8.4 g/dL — ABNORMAL LOW (ref 12.0–15.0)
MCH: 23.6 pg — ABNORMAL LOW (ref 26.0–34.0)
MCHC: 30.4 g/dL (ref 30.0–36.0)
MCV: 77.5 fL — ABNORMAL LOW (ref 80.0–100.0)
Platelets: 299 10*3/uL (ref 150–400)
RBC: 3.56 MIL/uL — ABNORMAL LOW (ref 3.87–5.11)
RDW: 16.7 % — ABNORMAL HIGH (ref 11.5–15.5)
WBC: 5.8 10*3/uL (ref 4.0–10.5)
nRBC: 0 % (ref 0.0–0.2)

## 2021-05-18 LAB — MAGNESIUM: Magnesium: 1 mg/dL — ABNORMAL LOW (ref 1.7–2.4)

## 2021-05-18 IMAGING — CT CT ANGIO HEAD-NECK (W OR W/O PERF)
1 of 11 series · 5 of 33 positions shown · IV contrast (APPLIED)
Comparison: Brain MRI from yesterday

CLINICAL DATA: Left-sided weakness.2

EXAM:
CT ANGIOGRAPHY HEAD AND NECK
TECHNIQUE: Multidetector CT imaging of the head and neck was performed using
the standard protocol during bolus administration of intravenous
contrast. Multiplanar CT image reconstructions and MIPs were
obtained to evaluate the vascular anatomy. Carotid stenosis
measurements (when applicable) are obtained utilizing NASCET
criteria, using the distal internal carotid diameter as the
denominator.
CONTRAST:  75mL OMNIPAQUE IOHEXOL 350 MG/ML SOLN

[Series 12: ax thin · axial · 0.40mm/px · z∈[-376,-168]mm · 5 of 315 slices shown]
[im 53/315  soft-tissue]
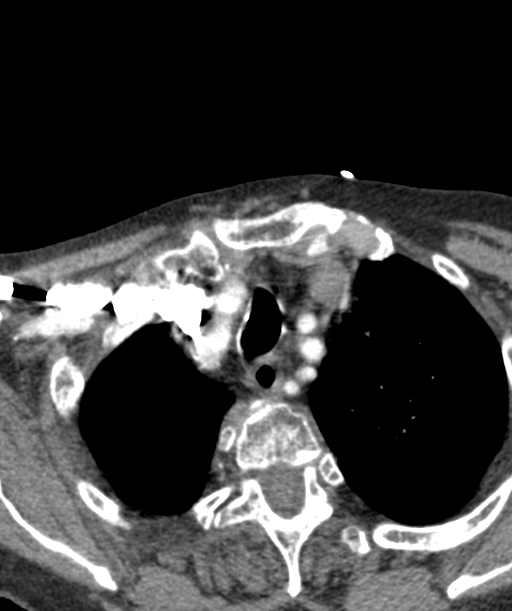
[im 105/315  bone]
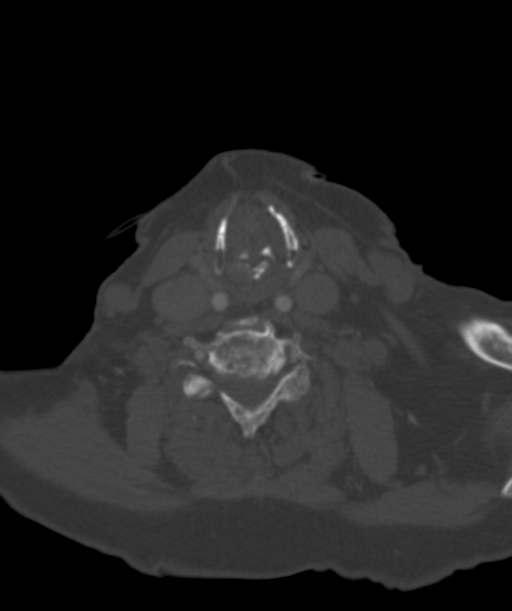
[im 158/315  soft-tissue]
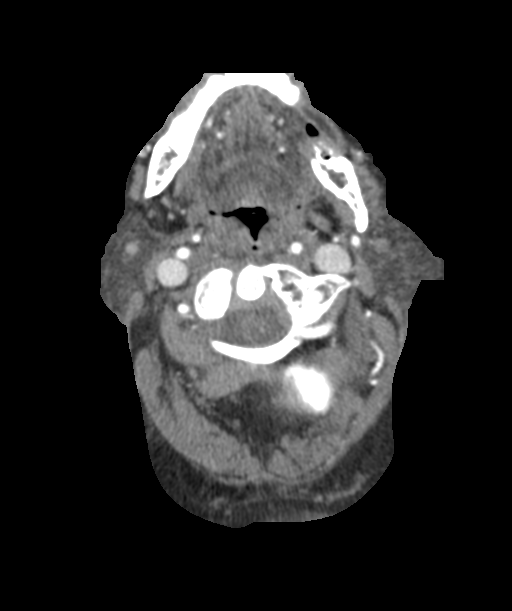
[im 210/315  bone]
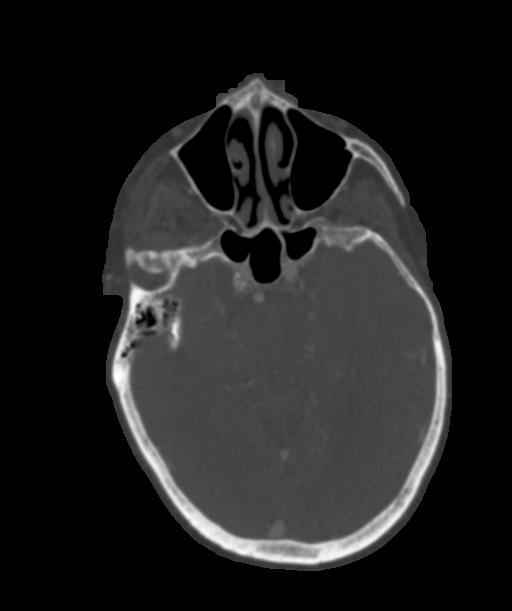
[im 262/315  soft-tissue]
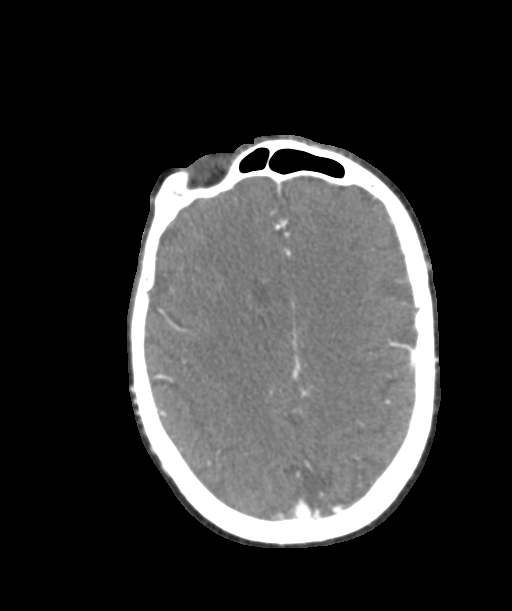

[5 of 33 positions shown; findings below may reference images not displayed]

FINDINGS: CT HEAD FINDINGS

Brain: Very subtle white matter infarct when compared to prior brain
MRI. No evidence of progression, hemorrhage, hydrocephalus, or mass.
Chronic small vessel ischemia

Vascular: See below

Skull: Negative

Sinuses: Negative

Orbits: Negative

Review of the MIP images confirms the above findings

CTA NECK FINDINGS

Aortic arch: Extensive atheromatous plaque with irregular luminal
thrombus.

Right carotid system: Calcified plaque at the origin without flow
limiting stenosis. Partial retropharyngeal course. 60% stenosis at
the right ICA origin. No ulceration or dissection.

Left carotid system: Atheromatous plaque greatest at the bifurcation
with 55% narrowing at the left ICA origin due to calcified plaque.
No limiting stenosis or ulceration.

Vertebral arteries: Proximal subclavian atherosclerosis without flow
reducing stenosis. Calcified plaque at the left vertebral origin,
associated stenosis not assessed due to motion artifact.

Skeleton: No acute finding

Other neck: No acute finding

Upper chest: Airway thickening.

Review of the MIP images confirms the above findings

CTA HEAD FINDINGS

Anterior circulation: Atheromatous calcification along the carotid
siphons. No branch occlusion, beading, or aneurysm. Mild
atheromatous undulation attributed to atherosclerosis.

Posterior circulation: Vertebral and basilar arteries are smoothly
contoured and widely patent. Atheromatous calcification to a mild
degree along the V4 segments. Moderate atheromatous narrowing at the
right P2 segment.

Venous sinuses: Unremarkable

Anatomic variants: None significant

Review of the MIP images confirms the above findings
IMPRESSION: 1. No emergent finding.
2. Atherosclerosis most notably causing bilateral proximal ICA
stenosis measuring 60% on the right and 55% on the left.
3. Especially advanced atherosclerosis of the aorta with 2 sites of
luminal thrombus.

## 2021-05-18 MED ORDER — IPRATROPIUM-ALBUTEROL 0.5-2.5 (3) MG/3ML IN SOLN
3.0000 mL | RESPIRATORY_TRACT | Status: DC | PRN
  Administered 2021-05-18 – 2021-05-20 (×3): 3 mL via RESPIRATORY_TRACT
  Filled 2021-05-18 (×3): qty 3

## 2021-05-18 MED ORDER — MAGNESIUM SULFATE 4 GM/100ML IV SOLN
4.0000 g | Freq: Once | INTRAVENOUS | Status: AC
  Administered 2021-05-18: 10:00:00 4 g via INTRAVENOUS
  Filled 2021-05-18: qty 100

## 2021-05-18 MED ORDER — FE FUMARATE-B12-VIT C-FA-IFC PO CAPS
1.0000 | ORAL_CAPSULE | Freq: Three times a day (TID) | ORAL | Status: DC
  Administered 2021-05-18 – 2021-05-20 (×8): 1 via ORAL
  Filled 2021-05-18 (×9): qty 1

## 2021-05-18 MED ORDER — IOHEXOL 350 MG/ML SOLN
75.0000 mL | Freq: Once | INTRAVENOUS | Status: AC | PRN
  Administered 2021-05-18: 12:00:00 75 mL via INTRAVENOUS

## 2021-05-18 MED ORDER — POTASSIUM CHLORIDE CRYS ER 20 MEQ PO TBCR
40.0000 meq | EXTENDED_RELEASE_TABLET | ORAL | Status: AC
  Administered 2021-05-18 (×3): 40 meq via ORAL
  Filled 2021-05-18 (×3): qty 2

## 2021-05-18 MED ORDER — ENOXAPARIN SODIUM 80 MG/0.8ML IJ SOSY
1.0000 mg/kg | PREFILLED_SYRINGE | Freq: Two times a day (BID) | INTRAMUSCULAR | Status: DC
  Administered 2021-05-18 – 2021-05-19 (×4): 65 mg via SUBCUTANEOUS
  Filled 2021-05-18 (×5): qty 0.65

## 2021-05-18 NOTE — Evaluation (Signed)
Occupational Therapy Evaluation Patient Details Name: Marissa Fletcher MRN: [id number] DOB: March 25, 1941 Today's Date: 05/18/2021   History of Present Illness [name] who presented to [facility] ED yesterday 12//27/22 due to left sided weakness and fatigue upon waking up. 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   Clinical Impression   Patient presenting with decreased Ind in self care, balance, functional mobility/transfers, endurance, and safety awareness. Patient with decreased Ind in self care, functional mobility/transfers, balance, endurance, and safety awareness. Pt reports living at home independently and alone. She performs all IADLs and ADL tasks independently at baseline. Pt on 2 Ls via [place] and decreased to 90% with functional tasks and cuing for pursed lip breathing.  Pt was placed on RA briefly with just conversation she dropped into the 80's. Pt able to perform bed mobility with min A. She stands without use of AD with min A and stands at sink for several minutes for self care tasks. Pt does have decreased dexterity and speed in L UE. Patient will benefit from acute OT to increase overall independence in the areas of ADLs, functional mobility, and safety awareness in order to safely discharge to next venue of care.      Recommendations for follow up therapy are one component of a multi-disciplinary discharge planning process, led by the attending physician.  Recommendations may be updated based on patient status, additional functional criteria and insurance authorization.   Follow Up Recommendations  Skilled nursing-short term rehab (<3 hours/day)    Assistance Recommended at Discharge Intermittent Supervision/Assistance  Functional Status Assessment  Patient has had a recent decline in their functional status and demonstrates the ability to make significant improvements in function in a reasonable and  predictable amount of time.  Equipment Recommendations  Other (comment) (defer to next venue of care)       Precautions / Restrictions Precautions Precautions: Fall Restrictions Weight Bearing Restrictions: No      Mobility Bed Mobility Overal bed mobility: Modified Independent             General bed mobility comments: Increased time and effort, increased use of BUE to pull into sitting, EOB elevated    Transfers Overall transfer level: Needs assistance Equipment used: Rolling walker (2 wheels) Transfers: Sit to/from Stand;Bed to chair/wheelchair/BSC Sit to Stand: Min assist Stand pivot transfers: Min assist         General transfer comment: Patient required Min A to complete sit to stand from elevated surface, Mod A to complete from lower seated surfaces such as BSC      Balance Overall balance assessment: Needs assistance Sitting-balance support: Single extremity supported;Bilateral upper extremity supported;Feet supported Sitting balance-Leahy Scale: Good Sitting balance - Comments: can each outside BOS without LOB     Standing balance-Leahy Scale: Fair Standing balance comment: Mild unsteadiness noted in standing, requires assistance from AD for balance                           ADL either performed or assessed with clinical judgement   ADL Overall ADL's : Needs assistance/impaired     Grooming: Wash/dry hands;Wash/dry face;Oral care;Standing;Minimal assistance               Lower Body Dressing: Minimal assistance   Toilet Transfer: Minimal assistance Toilet Transfer Details (indicate cue type and reason): simulated         Functional mobility  during ADLs: Min guard;Minimal assistance       Vision Patient Visual Report: No change from baseline              Pertinent Vitals/Pain Pain Assessment: No/denies pain        Extremity/Trunk Assessment Upper Extremity Assessment Upper Extremity Assessment: Generalized  weakness;LUE deficits/detail LUE Deficits / Details: decreased dexterity and speed with rapid alternating movements but L UE appears functional with tasks.   Lower Extremity Assessment Lower Extremity Assessment: Generalized weakness RLE Deficits / Details: RLE strength grossly 3+ to 4-/5 LLE Deficits / Details: LLE strength <RLE strength, however grossly 3/5       Communication Communication Communication: No difficulties   Cognition Arousal/Alertness: Awake/alert Behavior During Therapy: WFL for tasks assessed/performed Overall Cognitive Status: Within Functional Limits for tasks assessed                                 General Comments: A&O x3 location, year, month        Exercises Other Exercises Other Exercises: x2 sit to stands from EOB at Min A, Elevated surface with RW, x1 sit to stand from [facility] required Mod A with RW Other Exercises: patient educated on role of PT, fall risk Other Exercises: patient educated on proper DME use when performing sit to stands with RW   Shoulder Instructions      Home Living Family/patient expects to be discharged to:: Private residence Living Arrangements: Alone Available Help at Discharge: Family;Friend(s);Neighbor;Available PRN/intermittently Type of Home: Apartment Home Access: Level entry     Home Layout: One level     Bathroom Shower/Tub: Teacher, early years/pre: Standard     Home Equipment: International aid/development worker (2 wheels)          Prior Functioning/Environment Prior Level of Function : Independent/Modified Independent             Mobility Comments: Independent to Mod I ADLs Comments: Independent to Mod I        OT Problem List: Decreased strength;Decreased activity tolerance;Impaired balance (sitting and/or standing);Impaired UE functional use;Decreased knowledge of use of DME or AE;Decreased safety awareness;Cardiopulmonary status limiting activity      OT  Treatment/Interventions: Self-care/ADL training;Therapeutic exercise;Therapeutic activities;Neuromuscular education;DME and/or AE instruction;Patient/family education;Balance training    OT Goals(Current goals can be found in the care plan section) Acute Rehab OT Goals Patient Stated Goal: to return home OT Goal Formulation: With patient Time For Goal Achievement: 06/01/21 Potential to Achieve Goals: Good ADL Goals Pt Will Perform Grooming: with modified independence;standing Pt Will Perform Lower Body Dressing: with modified independence;sit to/from stand Pt Will Transfer to Toilet: with modified independence;ambulating Pt Will Perform Toileting - Clothing Manipulation and hygiene: with modified independence;sit to/from stand  OT Frequency: Min 3X/week   Barriers to D/C: Decreased caregiver support  Pt lives at home alone          AM-PAC OT "6 Clicks" Daily Activity     Outcome Measure Help from another person eating meals?: A Little Help from another person taking care of personal grooming?: A Little Help from another person toileting, which includes using toliet, bedpan, or urinal?: A Little Help from another person bathing (including washing, rinsing, drying)?: A Little Help from another person to put on and taking off regular upper body clothing?: A Little Help from another person to put on and taking off regular lower body clothing?: A Little 6 Click Score:  18   End of Session Equipment Utilized During Treatment: Oxygen (2Ls) Nurse Communication: Mobility status;Precautions  Activity Tolerance: Patient tolerated treatment well Patient left: in bed;with call bell/phone within reach  OT Visit Diagnosis: Unsteadiness on feet (R26.81);Muscle weakness (generalized) (M62.81)                Time: 7425-9563 OT Time Calculation (min): 25 min Charges:  OT General Charges $OT Visit: 1 Visit OT Evaluation $OT Eval Moderate Complexity: 1 Mod OT Treatments $Self Care/Home  Management : 8-22 mins  Darleen Crocker, MS, OTR/L , CBIS ascom [phone]  05/18/21, 3:19 PM

## 2021-05-18 NOTE — Evaluation (Signed)
Physical Therapy Evaluation Patient Details Name: Marissa Fletcher MRN: [id number] DOB: 1940/08/13 Today's Date: 05/18/2021  History of Present Illness  [name] who presented to [facility] ED yesterday 12//27/22 due to left sided weakness and fatigue upon waking up. 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   Clinical Impression  Patient tolerated session well and was agreeable to treatment. Per MD patient cleared for PT with K+ being 2.7. Patient reported no pain throughout session. Prior to hospitalization, patient reported being Independent/Mod Independent with all ADLs and mobility. Globally, patient demonstrates decreased strength bilaterally (L>R) at 3/5 strength on the L and 3+ to 4-/5 on the R. Coordination mildly impaired on bilateral lower extremities. Mild unsteadiness noted with stand pivot transfers and with ambulation with RW within the room. Patient required Min A from elevated surface and RW to complete sit to stand transfers, and Mod A from lower seated surfaces such as BSC with RW due to LE weakness. Patient completed session with 2 L of O2. O2 dropped to 87% during ambulation. RN notified. O2 returned to 92% upon resting for 2 minutes. HR ranged from 100-103 bpm throughout session. Patient would continue to benefit from skilled physical therapy in order to optimize patient's return to PLOF. Recommend SNF following discharge from acute hospitalization at this time due to decreased care giver support at home.      Recommendations for follow up therapy are one component of a multi-disciplinary discharge planning process, led by the attending physician.  Recommendations may be updated based on patient status, additional functional criteria and insurance authorization.  Follow Up Recommendations Skilled nursing-short term rehab (<3 hours/day)    Assistance Recommended at Discharge Intermittent  Supervision/Assistance  Functional Status Assessment Patient has had a recent decline in their functional status and demonstrates the ability to make significant improvements in function in a reasonable and predictable amount of time.  Equipment Recommendations  Other (comment) (TBD at next location)    Recommendations for Other Services       Precautions / Restrictions Precautions Precautions: Fall Restrictions Weight Bearing Restrictions: No      Mobility  Bed Mobility Overal bed mobility: Modified Independent             General bed mobility comments: Increased time and effort, increased use of BUE to pull into sitting, EOB elevated    Transfers Overall transfer level: Needs assistance Equipment used: Rolling walker (2 wheels) Transfers: Sit to/from Stand;Bed to chair/wheelchair/BSC Sit to Stand: Min assist (from elevated surface, cueing to push up from bed to stand vs pull up from walker) Stand pivot transfers: Min guard (Mild instability noted)         General transfer comment: Patient required Min A to complete sit to stand from elevated surface, Mod A to complete from lower seated surfaces such as BSC    Ambulation/Gait Ambulation/Gait assistance: Min guard Gait Distance (Feet): 25 Feet Assistive device: Rolling walker (2 wheels) Gait Pattern/deviations: Step-to pattern;Narrow base of support;Trunk flexed;Decreased step length - right;Decreased step length - left Gait velocity: decreased     General Gait Details: no LOB noted, mild unsteadiness on feet  Stairs            Wheelchair Mobility    Modified Rankin (Stroke Patients Only)       Balance Overall balance assessment: Needs assistance Sitting-balance support: Single extremity supported;Bilateral upper extremity supported;Feet supported Sitting balance-Leahy Scale: Good Sitting balance -  Comments: can each outside BOS without LOB     Standing balance-Leahy Scale: Fair Standing balance  comment: Mild unsteadiness noted in standing, requires assistance from AD for balance                             Pertinent Vitals/Pain Pain Assessment: No/denies pain    Home Living Family/patient expects to be discharged to:: Private residence Living Arrangements: Alone Available Help at Discharge: Family;Friend(s);Neighbor;Available PRN/intermittently Type of Home: Apartment Home Access: Level entry       Home Layout: One level Home Equipment: International aid/development worker (2 wheels)      Prior Function               Mobility Comments: Independent to Mod I ADLs Comments: Independent to Mod I     Hand Dominance        Extremity/Trunk Assessment   Upper Extremity Assessment Upper Extremity Assessment: Defer to OT evaluation;Generalized weakness    Lower Extremity Assessment Lower Extremity Assessment: Generalized weakness;RLE deficits/detail (Global LE weakness 3/5 strength, sensation intact, RAM on LE mildly impaired) RLE Deficits / Details: RLE strength grossly 3+ to 4-/5 LLE Deficits / Details: LLE strength <RLE strength, however grossly 3/5       Communication   Communication: No difficulties  Cognition Arousal/Alertness: Awake/alert Behavior During Therapy: WFL for tasks assessed/performed Overall Cognitive Status: Within Functional Limits for tasks assessed                                 General Comments: A&O x3 location, year, month        General Comments      Exercises Other Exercises Other Exercises: x2 sit to stands from EOB at Min A, Elevated surface with RW, x1 sit to stand from [facility] required Mod A with RW Other Exercises: patient educated on role of PT, fall risk Other Exercises: patient educated on proper DME use when performing sit to stands with RW   Assessment/Plan    PT Assessment Patient needs continued PT services  PT Problem List Decreased strength;Decreased mobility;Decreased  coordination;Decreased activity tolerance;Decreased balance;Decreased knowledge of use of DME       PT Treatment Interventions DME instruction;Therapeutic activities;Gait training;Therapeutic exercise;Patient/family education;Balance training;Functional mobility training;Neuromuscular re-education    PT Goals (Current goals can be found in the Care Plan section)  Acute Rehab PT Goals Patient Stated Goal: to go home PT Goal Formulation: With patient Time For Goal Achievement: 06/01/21 Potential to Achieve Goals: Good    Frequency 7X/week   Barriers to discharge Decreased caregiver support (lives alone)      Co-evaluation               AM-PAC PT "6 Clicks" Mobility  Outcome Measure Help needed turning from your back to your side while in a flat bed without using bedrails?: A Little Help needed moving from lying on your back to sitting on the side of a flat bed without using bedrails?: A Little Help needed moving to and from a bed to a chair (including a wheelchair)?: A Little Help needed standing up from a chair using your arms (e.g., wheelchair or bedside chair)?: A Lot Help needed to walk in hospital room?: A Little Help needed climbing 3-5 steps with a railing? : A Lot 6 Click Score: 16    End of Session Equipment Utilized During Treatment: Gait belt;Oxygen Activity  Tolerance: Patient tolerated treatment well;No increased pain;Patient limited by fatigue Patient left: in bed;with call bell/phone within reach;with bed alarm set;with family/visitor present Nurse Communication: Mobility status PT Visit Diagnosis: Unsteadiness on feet (R26.81);Muscle weakness (generalized) (M62.81);Difficulty in walking, not elsewhere classified (R26.2);Other abnormalities of gait and mobility (R26.89)    Time: 3582-5189 PT Time Calculation (min) (ACUTE ONLY): 37 min   Charges:   PT Evaluation $PT Eval Low Complexity: 1 Low PT Treatments $Gait Training: 8-22 mins $Therapeutic  Activity: 8-22 mins        Iva Boop, PT  05/18/21. 12:01 PM

## 2021-05-18 NOTE — Consult Note (Signed)
Neurology Consultation  Reason for Consult: left sided weakness, stroke on MRI Referring Physician: Dr. Lolita Cram   CC: Left-sided weakness  History is obtained from: Patient, chart  HPI: Marissa Fletcher is a 80 y.o. female past history of COPD, stage I adenocarcinoma of the right upper lobe status post SBRT, diabetes, hypertension, tobacco abuse, presenting from home for evaluation of left-sided weakness with last known well at 3 AM on 05/17/2021 when she went to bed and woke up in the morning with her left arm and leg extremely weak.  The weakness got worse over the course of the day which made her come to the ER.  No cortical signs noted on initial examination.  MRI of the brain performed due to sudden onset of weakness that showed a stroke in the posterior limb of the right internal capsule.  Neurology consulted for further work-up. I was curb sided over the phone last evening while on-call and recommended that she be loaded with aspirin and Plavix given the look of the MRI consistent with a small vessel disease stroke. 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  Also noted was platelike atelectasis/consolidation in the anterior right upper lobe obscuring the previously noted nodule presumably radiation changes. Patient denies any strokelike symptoms in the past.  Denies any headaches or visual disturbances at this time.  Denies any tingling or numbness.  Reports left-sided weakness which has gotten significantly better than what it was yesterday morning.  Notes persistent shortness of breath and hoarseness of voice.   LKW: 3 AM 05/17/2021 tpa given?: no, outside the window Premorbid modified Rankin scale (mRS): 2-mostly related to her cancer   ROS:  Full ROS was performed and is negative except as noted in the HPI.   Past Medical History:  Diagnosis Date   Anxiety    Arthritis    Cancer ([place])    COPD (chronic obstructive pulmonary disease) ([place])    Depression    Diabetes mellitus without complication ([place])    History of kidney stones    Hypertension    Thoracic aortic aneurysm    Family History  Problem Relation Age of Onset   Dementia Mother    Congestive Heart Failure Mother    Bladder Cancer Father    Heart disease Father    Breast cancer Neg Hx    Social History:   reports that she has been smoking cigarettes. She has a 34.00 pack-year smoking history. She has never used smokeless tobacco. She reports that she does not drink alcohol and does not use drugs.  Medications  Current Facility-Administered Medications:    acetaminophen (TYLENOL) tablet 1,000 mg, 1,000 mg, Oral, TID PRN, Enzo Bi, MD   ALPRAZolam Duanne Moron) tablet 0.25 mg, 0.25 mg, Oral, Daily PRN, Enzo Bi, MD, 0.25 mg at 05/18/21 0002   aspirin EC tablet 81 mg, 81 mg, Oral, Daily, Enzo Bi, MD   atorvastatin (LIPITOR) tablet 40 mg, 40 mg, Oral, Daily, Enzo Bi, MD   clopidogrel (PLAVIX) tablet 75 mg, 75 mg, Oral, Daily, Enzo Bi, MD   doxepin (SINEQUAN) capsule 50 mg, 50 mg, Oral, QHS, Enzo Bi, MD, 50 mg at 05/17/21 2314   enoxaparin (LOVENOX) injection 40 mg, 40 mg, Subcutaneous, Q24H, Enzo Bi, MD, 40 mg at 05/17/21 2505  gabapentin (NEURONTIN) capsule 300 mg, 300 mg, Oral, QHS, Enzo Bi, MD, 300 mg at 05/17/21 2225   guaiFENesin-dextromethorphan (ROBITUSSIN DM) 100-10 MG/5ML syrup 10 mL, 10 mL, Oral, Q6H PRN, Enzo Bi, MD   loperamide (IMODIUM) capsule 2 mg, 2 mg, Oral, PRN, Mansy, Jan A, MD, 2 mg at 05/18/21 0017   nicotine (NICODERM CQ - dosed in mg/24 hours) patch 21 mg, 21 mg, Transdermal, Daily, Enzo Bi, MD, 21 mg at 05/17/21 2226   ondansetron (ZOFRAN) injection 4 mg, 4 mg, Intravenous, Q6H PRN, Enzo Bi, MD   ondansetron (ZOFRAN-ODT)  disintegrating tablet 4 mg, 4 mg, Oral, Q8H PRN, Enzo Bi, MD   PARoxetine (PAXIL) tablet 30 mg, 30 mg, Oral, Toya Smothers, MD   Exam: Current vital signs: BP 140/70 (BP Location: Left Arm)    Pulse 95    Temp 97.9 F (36.6 C)    Resp 16    Ht 5\' 2"  (1.575 m)    Wt 65.7 kg    SpO2 96%    BMI 26.49 kg/m  Vital signs in last 24 hours: Temp:  [97.9 F (36.6 C)-98.5 F (36.9 C)] 97.9 F (36.6 C) (12/28 0803) Pulse Rate:  [81-105] 95 (12/28 0803) Resp:  [16-26] 16 (12/28 0803) BP: (118-141)/(58-79) 140/70 (12/28 0803) SpO2:  [85 %-98 %] 96 % (12/28 0803) Weight:  [64.9 kg-65.7 kg] 65.7 kg (12/27 2203) General: Awake alert mildly short of breath on supplemental oxygen HEENT: Normocephalic/atraumatic, edentulous with poor oral hygiene CVS: Regular rate rhythm Abdomen nondistended nontender Extremities warm well perfused Respiratory: Coarse breath sounds all over with scattered rales and wheezing Neurological exam Awake alert oriented x3 Speech is mildly dysarthric but she says that this is her normal speech without her dentures on. No evidence of aphasia Naming comprehension repetition intact Cranial nerves II to XII intact.  No facial asymmetry noted Motor examination with mild drift in the left lower extremity.  No drift otherwise.  On individual muscle testing right upper extremity 5/5, left upper extremity 4+/5.  Right lower extremity 5/5.  Left lower extremity 4-/5. Sensation intact light touch without extinction Coordination with mild dysmetria not disproportionate to weakness on the left upper extremity.  Difficulty performing the left lower extremity.  No dysmetria on the right. Gait testing deferred NIH stroke scale: 1 for left lower extremity.  Labs I have reviewed labs in epic and the results pertinent to this consultation are:  CBC    Component Value Date/Time   WBC 5.8 05/18/2021 0507   RBC 3.56 (L) 05/18/2021 0507   HGB 8.4 (L) 05/18/2021 0507   HCT 27.6 (L)  05/18/2021 0507   PLT 299 05/18/2021 0507   MCV 77.5 (L) 05/18/2021 0507   MCH 23.6 (L) 05/18/2021 0507   MCHC 30.4 05/18/2021 0507   RDW 16.7 (H) 05/18/2021 0507   LYMPHSABS 1.0 05/17/2021 0956   MONOABS 0.4 05/17/2021 0956   EOSABS 0.1 05/17/2021 0956   BASOSABS 0.0 05/17/2021 0956    CMP     Component Value Date/Time   NA 137 05/17/2021 0956   K 2.9 (L) 05/17/2021 0956   CL 99 05/17/2021 0956   CO2 29 05/17/2021 0956   GLUCOSE 119 (H) 05/17/2021 0956   BUN 9 05/17/2021 0956   CREATININE 0.63 05/17/2021 0956   CALCIUM 8.0 (L) 05/17/2021 0956   PROT 7.0 05/17/2021 0956   ALBUMIN 3.5 05/17/2021 0956   AST 19 05/17/2021 0956   ALT 12 05/17/2021 0956   ALKPHOS 132 (H)  05/17/2021 0956   BILITOT 0.5 05/17/2021 0956   GFRNONAA >60 05/17/2021 0956    Lipid Panel     Component Value Date/Time   CHOL 147 05/17/2021 0956   TRIG 65 05/17/2021 0956   HDL 38 (L) 05/17/2021 0956   CHOLHDL 3.9 05/17/2021 0956   VLDL 13 05/17/2021 0956   LDLCALC 96 05/17/2021 0956   2D echo: Pending LDL 96 A1c-pending  Imaging I have reviewed the images obtained: MRI of the brain with an acute infarct in the posterior limb of the right internal capsule.  Moderate chronic small vessel disease. CT angio chest PE protocol: No evidence of PE.  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  Also noted was platelike atelectasis/consolidation in the anterior right upper lobe obscuring the previously noted nodule presumably radiation changes.   Assessment:  80 year old with past history of tobacco abuse, COPD, stage I adenocarcinoma of the right upper lobe status post SBRT, diabetes, hypertension presenting from home for evaluation of left-sided weakness which has gradually improved and imaging revealed an acute infarct in the posterior limb of the right internal  capsule-although the appearance seems consistent with a small vessel etiology stroke given her risk factors of diabetes, hyperlipidemia and hypertension along with tobacco abuse, and embolic source is certainly reasonable given that the CT angio chest PE protocol revealed an extensive eccentric mural thrombus in the descending aorta with fusiform aneurysmal dilation. Head and neck vessel imaging has just been ordered-CTA head and neck. She has been loaded with aspirin and Plavix yesterday prior to the CT angio results of the chest becoming available. Based on further imaging and vascular surgery consultation, antiplatelet versus anticoagulation will be further discussed in detail. Given the history of cancer, anticoagulation with Lovenox would likely be needed in her case but I will wait for vascular surgery to evaluate the patient before making determinations for antiplatelet versus anticoagulation for stroke prevention.   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 -History of lung cancer -History of COPD -History of hypertension, tobacco abuse and diabetes  Recommendations: -Continue frequent neurochecks -CT angiography head and neck-ordered -Appreciate vascular surgery consultation for the descending thoracic aortic thrombus. -Continue aspirin and Plavix for now.  Final recommendations on antiplatelet versus anticoagulation for stroke prevention pending echo, CTA head and neck and vascular surgery consultation. -2D echo pending -A1c pending.  Goal A1c less than 7-medical management per primary team. -LDL 96.  Goal LDL less than 70.  High intensity statin-atorvastatin 80 now and daily. -PT OT speech therapy -Permissive hypertension for next 24 to 48 hours-treat only if  systolic blood pressures greater than 220 on a as needed basis.  After that, gradually normalize blood pressure for discharge blood pressure goal of less than 140/90.  Outpatient neurology follow-up in 4 to 6 weeks.  I will follow the remainder of the studies and further recommendations after the completion of the above.  Plan relayed to Dr. Reesa Chew on secure chat  -- Amie Portland, MD Neurologist [facility] Pager: [phone]

## 2021-05-18 NOTE — Progress Notes (Signed)
Dementria with the lab called a critical Potassium 2.7. Made MD aware and primary nurse aware.

## 2021-05-18 NOTE — Consult Note (Signed)
[facility] Vascular Consult Note  MRN : [id number]  ANWITHA MAPES is a 80 y.o. (Jun 11, 1940) female who presents with chief complaint of  Chief Complaint  Patient presents with   Weakness  .  History of Present Illness: I am asked to see the patient by Dr. 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  She has undergone a CT angiogram of the chest which I have independently reviewed.  This begins in the proximal descending thoracic aorta and extends down to the visceral vessels with a fairly extensive amount of mural thrombus.  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  This is unchanged from her study 1 year ago.  She was admitted with a recent stroke and this was not likely to be the cause of her recent stroke.  She has tolerated aspirin, Plavix, and a statin agent and remains on this therapy.  She denies any signs of peripheral embolization to her feet or legs.  No disabling claudication symptoms.  As part of her stroke work-up, she has undergone a CT angiogram of the head and neck which I have also reviewed.  The official report is of a 55% right ICA stenosis and 60% left ICA stenosis which I think are reasonable estimates or slightly less.  Current Facility-Administered Medications  Medication Dose Route Frequency Provider Last Rate Last Admin   acetaminophen (TYLENOL) tablet 1,000 mg  1,000 mg Oral TID PRN Enzo Bi, MD   1,000 mg at 05/18/21 1156   ALPRAZolam (XANAX) tablet 0.25 mg  0.25 mg Oral Daily PRN Enzo Bi, MD   0.25 mg at 05/18/21 0002   aspirin EC tablet 81 mg  81 mg Oral Daily Enzo Bi, MD   81 mg at 05/18/21 0940   atorvastatin (LIPITOR) tablet 40 mg  40 mg Oral Daily Enzo Bi, MD   40 mg at 05/18/21 0940   clopidogrel (PLAVIX) tablet 75 mg  75 mg Oral Daily Enzo Bi, MD   75 mg at 05/18/21 0939   doxepin (SINEQUAN) capsule 50 mg  50 mg Oral QHS Enzo Bi, MD   50 mg at 05/17/21 2314   enoxaparin (LOVENOX) injection 65 mg  1 mg/kg Subcutaneous BID Lorella Nimrod, MD   65 mg at 05/18/21 1238   ferrous [id number] C-folic acid (TRINSICON / FOLTRIN) capsule 1 capsule  1 capsule Oral TID Philis Fendt, MD   1 capsule at 05/18/21 1321   gabapentin (NEURONTIN) capsule 300 mg  300 mg Oral QHS Enzo Bi, MD   300 mg at 05/17/21 2225   guaiFENesin-dextromethorphan (ROBITUSSIN DM) 100-10 MG/5ML syrup 10 mL  10 mL Oral Q6H PRN Enzo Bi, MD       loperamide (IMODIUM) capsule 2 mg  2 mg Oral PRN Mansy, Jan A, MD   2 mg at 05/18/21 0017   nicotine (NICODERM CQ - dosed in mg/24 hours) patch 21 mg  21 mg Transdermal Daily Enzo Bi, MD   21 mg at 05/18/21 0940   ondansetron (ZOFRAN) injection 4 mg  4 mg Intravenous Q6H PRN Enzo Bi, MD       ondansetron (ZOFRAN-ODT) disintegrating tablet 4 mg  4 mg Oral Q8H PRN Enzo Bi, MD       PARoxetine (PAXIL) tablet 30 mg  30 mg Oral Toya Smothers, MD   30 mg at 05/18/21 4008    Past Medical History:  Diagnosis  Date   Anxiety    Arthritis    Cancer ([place])    COPD (chronic obstructive pulmonary disease) ([place])    Depression    Diabetes mellitus without complication ([place])    History of kidney stones    Hypertension    Thoracic aortic aneurysm     Past Surgical History:  Procedure Laterality Date   ABDOMINAL HYSTERECTOMY     CHOLECYSTECTOMY     EYE SURGERY Bilateral    KYPHOPLASTY N/A 07/30/2020   Procedure: T10 Kyphoplasty;  Surgeon: Hessie Knows, MD;  Location: [facility] ORS;  Service: Orthopedics;  Laterality: N/A;  T10   KYPHOPLASTY N/A 08/19/2020   Procedure: T9 YPHOPLASTY;  Surgeon: Hessie Knows, MD;  Location: [facility] ORS;  Service: Orthopedics;  Laterality: N/A;     Social History   Tobacco Use   Smoking status: Every Day    Packs/day: 0.50    Years: 68.00    Pack years: 34.00    Types: Cigarettes   Smokeless tobacco: Never  Vaping Use   Vaping Use: Never used  Substance Use  Topics   Alcohol use: Never   Drug use: Never     Family History  Problem Relation Age of Onset   Dementia Mother    Congestive Heart Failure Mother    Bladder Cancer Father    Heart disease Father    Breast cancer Neg Hx     Allergies  Allergen Reactions   Sulfa Antibiotics Swelling     REVIEW OF SYSTEMS (Negative unless checked)  Constitutional: [] Weight loss  [] Fever  [] Chills Cardiac: [] Chest pain   [] Chest pressure   [] Palpitations   [] Shortness of breath when laying flat   [] Shortness of breath at rest   [] Shortness of breath with exertion. Vascular:  [] Pain in legs with walking   [] Pain in legs at rest   [] Pain in legs when laying flat   [] Claudication   [] Pain in feet when walking  [] Pain in feet at rest  [] Pain in feet when laying flat   [] History of DVT   [] Phlebitis   [] Swelling in legs   [] Varicose veins   [] Non-healing ulcers Pulmonary:   [] Uses home oxygen   [] Productive cough   [] Hemoptysis   [] Wheeze  [x] COPD   [] Asthma Neurologic:  [] Dizziness  [] Blackouts   [] Seizures   [x] History of stroke   [] History of TIA  [] Aphasia   [] Temporary blindness   [] Dysphagia   [] Weakness or numbness in arms   [] Weakness or numbness in legs Musculoskeletal:  [x] Arthritis   [] Joint swelling   [x] Joint pain   [] Low back pain Hematologic:  [] Easy bruising  [] Easy bleeding   [] Hypercoagulable state   [] Anemic  [] Hepatitis Gastrointestinal:  [] Blood in stool   [] Vomiting blood  [] Gastroesophageal reflux/heartburn   [] Difficulty swallowing. Genitourinary:  [] Chronic kidney disease   [] Difficult urination  [] Frequent urination  [] Burning with urination   [] Blood in urine Skin:  [] Rashes   [] Ulcers   [] Wounds Psychological:  [x] History of anxiety   [x]  History of major depression.  Physical Examination  Vitals:   05/17/21 2345 05/18/21 0447 05/18/21 0803 05/18/21 1205  BP: (!) 141/62 136/70 140/70 (!) 149/70  Pulse: (!) 105 91 95 100  Resp: 20 20 16 20   Temp: 98 F (36.7 C) 97.9 F  (36.6 C) 97.9 F (36.6 C) 98.6 F (37 C)  TempSrc: Oral Oral    SpO2: 96% 98% 96% (!) 88%  Weight:      Height:  Body mass index is 26.49 kg/m. Gen:  WD/WN, NAD Head: [place]/AT, No temporalis wasting. Ear/Nose/Throat: Hearing grossly intact, nares w/o erythema or drainage, oropharynx w/o Erythema/Exudate Eyes: Sclera non-icteric, conjunctiva clear Neck: Trachea midline.  No JVD.  Pulmonary:  Good air movement, respirations not labored, equal bilaterally.  Cardiac: RRR, normal S1, S2. Vascular:  Vessel Right Left  Radial Palpable Palpable   Musculoskeletal: M/S 5/5 throughout.  Extremities without ischemic changes.  No deformity or atrophy. No edema. Neurologic: Sensation grossly intact in extremities.  Symmetrical.  Speech is fluent. Motor exam as listed above. Psychiatric: Judgment intact, Mood & affect appropriate for pt's clinical situation. Dermatologic: No rashes or ulcers noted.  No cellulitis or open wounds.      CBC Lab Results  Component Value Date   WBC 5.8 05/18/2021   HGB 8.4 (L) 05/18/2021   HCT 27.6 (L) 05/18/2021   MCV 77.5 (L) 05/18/2021   PLT 299 05/18/2021    BMET    Component Value Date/Time   NA 138 05/18/2021 0507   K 2.7 (LL) 05/18/2021 0507   CL 102 05/18/2021 0507   CO2 28 05/18/2021 0507   GLUCOSE 107 (H) 05/18/2021 0507   BUN 8 05/18/2021 0507   CREATININE 0.61 05/18/2021 0507   CALCIUM 7.3 (L) 05/18/2021 0507   GFRNONAA >60 05/18/2021 0507   Estimated Creatinine Clearance: 49 mL/min (by C-G formula based on SCr of 0.61 mg/dL).  COAG Lab Results  Component Value Date   INR 1.0 05/17/2021   INR 0.9 08/11/2020   INR 0.9 07/26/2020    Radiology CT ANGIO HEAD NECK W WO CM  Result Date: 05/18/2021 CLINICAL DATA:  Left-sided weakness.2 EXAM: CT ANGIOGRAPHY HEAD AND NECK TECHNIQUE: Multidetector CT imaging of the head and neck was performed using the standard protocol during bolus administration of intravenous contrast.  Multiplanar CT image reconstructions and MIPs were obtained to evaluate the vascular anatomy. Carotid stenosis measurements (when applicable) are obtained utilizing NASCET criteria, using the distal internal carotid diameter as the denominator. CONTRAST:  70mL OMNIPAQUE IOHEXOL 350 MG/ML SOLN COMPARISON:  Brain MRI from yesterday FINDINGS: CT HEAD FINDINGS Brain: Very subtle white matter infarct when compared to prior brain MRI. No evidence of progression, hemorrhage, hydrocephalus, or mass. Chronic small vessel ischemia Vascular: See below Skull: Negative Sinuses: Negative Orbits: Negative Review of the MIP images confirms the above findings CTA NECK FINDINGS Aortic arch: Extensive atheromatous plaque with irregular luminal thrombus. Right carotid system: Calcified plaque at the origin without flow limiting stenosis. Partial retropharyngeal course. 60% stenosis at the right ICA origin. No ulceration or dissection. Left carotid system: Atheromatous plaque greatest at the bifurcation with 55% narrowing at the left ICA origin due to calcified plaque. No limiting stenosis or ulceration. Vertebral arteries: Proximal subclavian atherosclerosis without flow reducing stenosis. Calcified plaque at the left vertebral origin, associated stenosis not assessed due to motion artifact. Skeleton: No acute finding Other neck: No acute finding Upper chest: Airway thickening. Review of the MIP images confirms the above findings CTA HEAD FINDINGS Anterior circulation: Atheromatous calcification along the carotid siphons. No branch occlusion, beading, or aneurysm. Mild atheromatous undulation attributed to atherosclerosis. Posterior circulation: Vertebral and basilar arteries are smoothly contoured and widely patent. Atheromatous calcification to a mild degree along the V4 segments. Moderate atheromatous narrowing at the right P2 segment. Venous sinuses: Unremarkable Anatomic variants: None significant Review of the MIP images confirms  the above findings IMPRESSION: 1. No emergent finding. 2. Atherosclerosis most notably causing bilateral proximal  ICA stenosis measuring 60% on the right and 55% on the left. 3. Especially advanced atherosclerosis of the aorta with 2 sites of luminal thrombus. Electronically Signed   By: Jorje Guild M.D.   On: 05/18/2021 12:07   DG Chest 2 View  Result Date: 05/17/2021 CLINICAL DATA:  Shortness of breath EXAM: CHEST - 2 VIEW COMPARISON:  Radiographs done on 08/11/2020 and CT done on 02/08/2021 FINDINGS: Transverse diameter of heart is increased. There are no signs of alveolar pulmonary edema. There is transverse linear infiltrate in the right parahilar region. Rest of the lung fields are essentially clear. There is no significant pleural effusion or pneumothorax. There is previous vertebroplasty in the thoracic spine. IMPRESSION: Cardiomegaly. There is transverse linear infiltrate in the right parahilar region suggesting atelectasis/pneumonia. Electronically Signed   By: Elmer Picker M.D.   On: 05/17/2021 10:30   CT HEAD WO CONTRAST  Result Date: 05/17/2021 CLINICAL DATA:  Left-sided weakness EXAM: CT HEAD WITHOUT CONTRAST TECHNIQUE: Contiguous axial images were obtained from the base of the skull through the vertex without intravenous contrast. COMPARISON:  None. FINDINGS: Brain: There are no signs of bleeding within the cranium. Ventricles are not dilated. Cortical sulci are prominent. There is decreased density in the periventricular and subcortical white matter. Calcifications are seen in the basal ganglia. Vascular: There are scattered arterial calcifications. Skull: Unremarkable. Sinuses/Orbits: There is mucosal thickening in the frontal and ethmoid sinuses. 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 Other: None IMPRESSION: No acute intracranial findings are seen in noncontrast CT brain. Atrophy. Small-vessel  disease. Chronic sinusitis. Electronically Signed   By: Elmer Picker M.D.   On: 05/17/2021 10:55   CT Angio Chest PE W and/or Wo Contrast  Result Date: 05/17/2021 CLINICAL DATA:  Left-sided weakness, fatigue EXAM: CT ANGIOGRAPHY CHEST WITH CONTRAST TECHNIQUE: Multidetector CT imaging of the chest was performed using the standard protocol during bolus administration of intravenous contrast. Multiplanar CT image reconstructions and MIPs were obtained to evaluate the vascular anatomy. CONTRAST:  56mL OMNIPAQUE IOHEXOL 350 MG/ML SOLN COMPARISON:  02/08/2021 FINDINGS: Cardiovascular: Mild cardiomegaly. Trace pericardial effusion. The RV is nondilated. Satisfactory opacification of pulmonary arteries noted, and there is no evidence of pulmonary emboli. Coronary calcifications. Fair contrast opacification of the thoracic aorta. No dissection or stenosis. Atheromatous nonaneurysmal ascending segment. Classic 3 vessel brachiocephalic arterial origin anatomy from the arch without proximal stenosis. Coarse calcified plaque in the arch and did descend distal descending thoracic aorta. Stable eccentric mural thrombus in the distal arch and proximal and mid descending thoracic aorta. 4 cm fusiform aneurysmal dilatation of the distal descending segment, with eccentric nonocclusive mural thrombus. Visualized proximal abdominal aorta is mildly atheromatous, normal in caliber. Mediastinum/Nodes: Small hiatal hernia.  No mass or adenopathy. Lungs/Pleura: No pleural effusion. Platelike atelectasis or consolidation in the anterior right upper lobe. Upper Abdomen: Cholecystectomy clips. Small hiatal hernia. No acute findings. Musculoskeletal: Previous cement augmentation of T9 and T10 vertebral bodies. Stable compression deformities of T6 and T7. No acute fracture. Review of the MIP images confirms the above findings. IMPRESSION: 1. Negative for acute PE or thoracic aortic dissection. 2. Extensive eccentric mural thrombus in  the descending thoracic aorta, representing potential source of distal emboli. 3. 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 Recommend semi-annual imaging followup by CTA or MRA and referral to cardiothoracic surgery if not already obtained. This recommendation follows 2010 ACCF/AHA/AATS/ACR/ASA/SCA/SCAI/SIR/STS/SVM Guidelines for the Diagnosis  and Management of Patients With Thoracic Aortic Disease. Circulation. 2010; 121: e266-e36 4. Platelike atelectasis/consolidation in the in the anterior right upper lobe obscuring the previously noted nodule, presumably representing radiation change. Electronically Signed   By: Lucrezia Europe M.D.   On: 05/17/2021 18:53   MR BRAIN WO CONTRAST  Result Date: 05/17/2021 CLINICAL DATA:  Neuro deficit, acute, stroke suspected EXAM: MRI HEAD WITHOUT CONTRAST TECHNIQUE: Multiplanar, multiecho pulse sequences of the brain and surrounding structures were obtained without intravenous contrast. COMPARISON:  Same day CT head. FINDINGS: Mildly motion limited study. 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 Mild associated edema without mass effect. Moderate additional scattered T2/FLAIR hyperintensities within the white matter, nonspecific but compatible with chronic microvascular ischemic disease. No evidence of acute hemorrhage, hydrocephalus, mass lesion, midline shift, or extra-axial fluid collection Vascular: Major arterial flow voids are maintained at the skull base. Skull and upper cervical spine: Normal marrow signal. Sinuses/Orbits: Mild paranasal sinus mucosal thickening. Unremarkable orbits. Other: No mastoid effusions. IMPRESSION: 1. 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 Mild associated edema without mass effect. 2. Moderate chronic microvascular ischemic disease. Electronically Signed   By: Margaretha Sheffield M.D.   On: 05/17/2021 17:45      Assessment/Plan 1.  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  This is associated with mild aneurysmal dilatation of about 4 cm in the distal descending thoracic aorta.  This is essentially unchanged from her study 1 year ago.  This was not the cause of her stroke but could be a source of distal embolization in the future.  She is on appropriate medical therapy with aspirin, Plavix, and a statin agent.  No further therapy would be warranted at this time other than annual follow-up with CT scan.  We can follow her in the office. 2.  Recent stroke.  I have also independently reviewed her CT angiogram of the neck.  The official report is of 55% right ICA stenosis and 60% left ICA stenosis and I think that is a reasonable estimate for slightly less than that.  Given her age and comorbidities, I would recommend continued optimal medical therapy which she is already on. 3.  Hypertension.  Stable on outpatient medications and blood pressure control important in reducing the progression of atherosclerotic disease. On appropriate oral medications.    Leotis Pain, MD  05/18/2021 2:16 PM    This note was created with Dragon medical transcription system.  Any error is purely unintentional

## 2021-05-18 NOTE — Consult Note (Signed)
ANTICOAGULATION CONSULT NOTE - Initial Consult  Pharmacy Consult for Enoxaparin Indication: stroke  Allergies  Allergen Reactions   Sulfa Antibiotics Swelling    Patient Measurements: Height: 5\' 2"  (157.5 cm) Weight: 65.7 kg (144 lb 13.5 oz) IBW/kg (Calculated) : 50.1 Enoxaparin Dosing Weight: 65.7kg  Vital Signs: Temp: 97.9 F (36.6 C) (12/28 0803) Temp Source: Oral (12/28 0447) BP: 140/70 (12/28 0803) Pulse Rate: 95 (12/28 0803)  Labs: Recent Labs    05/17/21 0956 05/18/21 0507  HGB 9.6* 8.4*  HCT 30.7* 27.6*  PLT 323 299  APTT 27  --   LABPROT 13.5  --   INR 1.0  --   CREATININE 0.63 0.61  TROPONINIHS 27*  --     Estimated Creatinine Clearance: 49 mL/min (by C-G formula based on SCr of 0.61 mg/dL).   Medical History: Past Medical History:  Diagnosis Date   Anxiety    Arthritis    Cancer ([place])    COPD (chronic obstructive pulmonary disease) ([place])    Depression    Diabetes mellitus without complication ([place])    History of kidney stones    Hypertension    Thoracic aortic aneurysm     Medications:  12/27 1903 Lovenox 40mg  subQ   Assessment: 80yo female admitted for acute ischemic stroke. Therapeutic anticoagulation initiated with lovenox.   CBC 05/18/2021    Component Value Date/Time   WBC 5.8 05/18/2021 0507   RBC 3.56 (L) 05/18/2021 0507   HGB 8.4 (L) 05/18/2021 0507   HCT 27.6 (L) 05/18/2021 0507   PLT 299 05/18/2021 0507   MCV 77.5 (L) 05/18/2021 0507   MCH 23.6 (L) 05/18/2021 0507   MCHC 30.4 05/18/2021 0507   RDW 16.7 (H) 05/18/2021 0507   LYMPHSABS 1.0 05/17/2021 0956   MONOABS 0.4 05/17/2021 0956   EOSABS 0.1 05/17/2021 0956   BASOSABS 0.0 05/17/2021 0956    Goal of Therapy:  Anti-Xa Level of 0.6-1 units/mL Monitor platelets by anticoagulation protocol: Yes   Plan:  Lovenox ordered to start 12/28 1245 at 1mg /kg subQ twice daily. CBC will be collected every 72 hours.  Darrick Penna 05/18/2021,11:56 AM

## 2021-05-18 NOTE — TOC Initial Note (Addendum)
Transition of Care [facility]) - Initial/Assessment Note    Patient Details  Name: Marissa Fletcher MRN: [id number] Date of Birth: 05/09/1941  [facility] CM/SW Contact:    Pete Pelt, RN Phone Number: 05/18/2021, 4:22 PM  Clinical Narrative:    Patient lives at home alone, drives to appointment and pharmacy and takes medications as directed. Patient sees Dr. Lovie Macadamia, PCP every 6 months  Patient's ex daughter in law assists her as needed at home.  Patient refuses SNF, but accepts [facility], has walker, commode and shower chair at home.                 She is comfortable returning home, and she has no concerns for TOC at this time.  TOC to follow for needs.  Addendum:  [facility] notified  Expected Discharge Plan: [place] Barriers to Discharge: Continued Medical Work up   Patient Goals and CMS Choice     Choice offered to / list presented to : NA  Expected Discharge Plan and Services Expected Discharge Plan: [name]   Discharge Planning Services: CM Consult Post Acute Care Choice: [place] arrangements for the past 2 months: Single Family Home                                      Prior Living Arrangements/Services Living arrangements for the past 2 months: Single Family Home Lives with:: Self Patient language and need for interpreter reviewed:: Yes (NO interpreter required) Do you feel safe going back to the place where you live?: Yes      Need for Family Participation in Patient Care: Yes (Comment) Care giver support system in place?: Yes (comment) Current home services: DME (walker) Criminal Activity/Legal Involvement Pertinent to Current Situation/Hospitalization: No - Comment as needed  Activities of Daily Living Home Assistive Devices/Equipment: Shower chair without back, Walker (specify type), Raised toilet seat with rails ADL Screening (condition at time of admission) Patient's  cognitive ability adequate to safely complete daily activities?: Yes Is the patient deaf or have difficulty hearing?: No Does the patient have difficulty seeing, even when wearing glasses/contacts?: No Does the patient have difficulty concentrating, remembering, or making decisions?: No Patient able to express need for assistance with ADLs?: Yes Does the patient have difficulty dressing or bathing?: Yes Independently performs ADLs?: Yes (appropriate for developmental age) Does the patient have difficulty walking or climbing stairs?: Yes Weakness of Legs: Left Weakness of Arms/Hands: None  Permission Sought/Granted Permission sought to share information with : Case Manager Permission granted to share information with : Yes, Verbal Permission Granted     Permission granted to share info w AGENCY: [facility] home [facility]        Emotional Assessment Appearance:: Appears stated age Attitude/Demeanor/Rapport: Gracious, Engaged Affect (typically observed): Pleasant, Appropriate Orientation: : Oriented to Self, Oriented to Place, Oriented to  Time, Oriented to Situation Alcohol / Substance Use: Not Applicable Psych Involvement: No (comment)  Admission diagnosis:  Stroke (cerebrum) ([place]) [I63.9] Unilateral weakness [R53.1] Acute respiratory failure with hypoxia (H[facility]) [J96.01] Stroke-like symptom [R29.90] Acute hypoxemic respiratory failure ([place]) [J96.01] Patient Active Problem List   Diagnosis Date Noted   Acute hypoxemic respiratory failure ([place]) 05/17/2021   Stroke (cerebrum) ([place]) 05/17/2021   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 [place] [address] Phone: [phone] Fax: [place] [phone] [place], [place] - [address] [place] [address] Phone: [phone] Fax: [phone]     Social Determinants of Health (SDOH) Interventions    Readmission Risk Interventions No flowsheet data found.

## 2021-05-18 NOTE — Progress Notes (Signed)
PROGRESS NOTE    Marissa Fletcher  XBJ:[id number] DOB: [birth year] DOA: 05/17/2021 PCP: Juluis Pitch, MD   Brief Narrative: Taken from H&P. Marissa Fletcher is a 80 y.o. 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  Weakness improved by the time she arrived in ED. Also having some sinus congestion and cough for the past several weeks. Patient is a lifetime smoker and still smokes. Being treated for adenocarcinoma of right upper lobe.  MRI brain shows an acute infarct in the posterior limb of right internal capsule. CTA with moderate stenosis, 50 to 60% in both external and internal carotids. CTA chest was negative for PE but did show a mural thrombus in aorta and an aortic aneurysm of 4 cm which remains unchanged as compared to prior.  Vascular surgery was consulted and they do not think that she needs any further treatment for her aortic mural thrombus, they are recommending continuation of aspirin and Plavix and outpatient follow-up with vascular surgery. Neurology is on board. PT is recommending SNF.  Subjective: Patient was seen and examined today.  Sister at bedside.  She thinks that her left-sided weakness has been improved.  No new complaints.  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  CTA with 50 to 60% stenosis, vascular surgery does not think that she needs any intervention at this time. Left-sided weakness has been resolved.  She received loading doses of aspirin and Plavix and was started on DAPT. -Complete stroke work-up -PT is recommending SNF -Continue with DAPT -Continue with statin-dose was increased to 40 mg daily. -Echocardiogram with  EF of 30 to 35%, global hypokinesis and dilated cardiomyopathy-we will consult cardiology tomorrow.  Acute hypoxic respiratory failure.  History of COPD and underlying lung malignancy.  Saturating well on 2 L of oxygen now.  Per patient no prior oxygen use.. 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  Vascular surgery was consulted and they do not think that any intervention is warranted at this time.  Can cause future embolic strokes. -Outpatient vascular surgery follow-up. -Continue with DAPT -They are not recommending any anticoagulation except aspirin and Plavix.  Concern of a pneumonia-ruled out.  Remained afebrile with no leukocytosis.  There was some linear infiltrates in the right perihilar region which is most likely atelectasis versus from radiation. Procalcitonin at 0.16, history of underlying malignancy.  She received ceftriaxone and Zithromax in ED which was not continued on admission. -No need for antibiotics at this time  Hypokalemia/hypomagnesemia.  Potassium of 2.7 with magnesium of 1 -Replete electrolytes and monitor  History of iron deficiency anemia.  Hemoglobin decreased to 8.4 this morning, no obvious bleeding.  All cell lines decreased.  Iron studies with iron deficiency. -Start her on iron supplement  Type 2 diabetes mellitus.  On metformin at home.  A1c of 6.2 -Continue to monitor  Essential hypertension.  Blood pressure within upper normal limit. 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  Remained unchanged --outpatient followup with vascular surgery  Objective: Vitals:  05/18/21 0447 05/18/21 0803 05/18/21 1205 05/18/21 1619  BP: 136/70 140/70 (!) 149/70 139/77  Pulse: 91 95 100 92  Resp: 20 16 20 20   Temp: 97.9 F  (36.6 C) 97.9 F (36.6 C) 98.6 F (37 C) 98 F (36.7 C)  TempSrc: Oral   Oral  SpO2: 98% 96% (!) 88% 94%  Weight:      Height:        Intake/Output Summary (Last 24 hours) at 05/18/2021 1646 Last data filed at 05/18/2021 1457 Gross per 24 hour  Intake 187.65 ml  Output 800 ml  Net -612.35 ml   Filed Weights   05/17/21 0954 05/17/21 2203  Weight: 64.9 kg 65.7 kg    Examination:  General exam: Appears calm and comfortable  Respiratory system: Clear to auscultation. Respiratory effort normal. Cardiovascular system: S1 & S2 heard, RRR.  Gastrointestinal system: Soft, nontender, nondistended, bowel sounds positive. Central nervous system: Alert and oriented. No focal neurological deficits. Extremities: No edema, no cyanosis, pulses intact and symmetrical. Psychiatry: Judgement and insight appear normal.    DVT prophylaxis: Lovenox Code Status: Full Family Communication: Discussed with sister at bedside Disposition Plan:  Status is: Inpatient  Remains inpatient appropriate because: Severity of illness   Level of care: Med-Surg  All the records are reviewed and case discussed with Care Management/Social Worker. Management plans discussed with the patient, nursing and they are in agreement.  Consultants:  Neurology Vascular surgery  Procedures:  Antimicrobials:   Data Reviewed: I have personally reviewed following labs and imaging studies  CBC: Recent Labs  Lab 05/17/21 0956 05/18/21 0507  WBC 6.8 5.8  NEUTROABS 5.2  --   HGB 9.6* 8.4*  HCT 30.7* 27.6*  MCV 77.1* 77.5*  PLT 323 382   Basic Metabolic Panel: Recent Labs  Lab 05/17/21 0956 05/18/21 0507  NA 137 138  K 2.9* 2.7*  CL 99 102  CO2 29 28  GLUCOSE 119* 107*  BUN 9 8  CREATININE 0.63 0.61  CALCIUM 8.0* 7.3*  MG 1.0* 1.0*   GFR: Estimated Creatinine Clearance: 49 mL/min (by C-G formula based on SCr of 0.61 mg/dL). Liver Function Tests: Recent Labs  Lab 05/17/21 0956  AST 19  ALT  12  ALKPHOS 132*  BILITOT 0.5  PROT 7.0  ALBUMIN 3.5   No results for input(s): LIPASE, AMYLASE in the last 168 hours. No results for input(s): AMMONIA in the last 168 hours. Coagulation Profile: Recent Labs  Lab 05/17/21 0956  INR 1.0   Cardiac Enzymes: No results for input(s): CKTOTAL, CKMB, CKMBINDEX, TROPONINI in the last 168 hours. BNP (last 3 results) No results for input(s): PROBNP in the last 8760 hours. HbA1C: Recent Labs    05/18/21 0507  HGBA1C 6.2*   CBG: Recent Labs  Lab 05/17/21 2159 05/18/21 0802  GLUCAP 116* 113*   Lipid Profile: Recent Labs    05/17/21 0956  CHOL 147  HDL 38*  LDLCALC 96  TRIG 65  CHOLHDL 3.9   Thyroid Function Tests: No results for input(s): TSH, T4TOTAL, FREET4, T3FREE, THYROIDAB in the last 72 hours. Anemia Panel: Recent Labs    05/17/21 0956  TIBC 385  IRON 21*   Sepsis Labs: Recent Labs  Lab 05/17/21 0956  PROCALCITON 0.16    Recent Results (from the past 240 hour(s))  Blood culture (routine x 2)     Status: None (Preliminary result)   Collection Time: 05/17/21  4:35 PM   Specimen: BLOOD  Result Value Ref Range  Status   Specimen Description BLOOD BLOOD RIGHT FOREARM  Final   Special Requests   Final    BOTTLES DRAWN AEROBIC AND ANAEROBIC Blood Culture results may not be optimal due to an excessive volume of blood received in culture bottles   Culture   Final    NO GROWTH < 24 HOURS Performed at [facility], [address] [place], [place] [address]    Report Status PENDING  Incomplete  Blood culture (routine x 2)     Status: None (Preliminary result)   Collection Time: 05/17/21  4:40 PM   Specimen: BLOOD  Result Value Ref Range Status   Specimen Description BLOOD RIGHT ANTECUBITAL  Final   Special Requests   Final    BOTTLES DRAWN AEROBIC AND ANAEROBIC Blood Culture results may not be optimal due to an excessive volume of blood received in culture bottles   Culture   Final    NO GROWTH <  24 HOURS Performed at [facility], [address], [place], [place] [address]    Report Status PENDING  Incomplete  Resp Panel by RT-PCR (Flu A&B, Covid) Nasopharyngeal Swab     Status: None   Collection Time: 05/17/21  5:07 PM   Specimen: Nasopharyngeal Swab; Nasopharyngeal(NP) swabs in vial transport medium  Result Value Ref Range Status   SARS Coronavirus 2 by RT PCR NEGATIVE NEGATIVE Final    Comment: (NOTE) SARS-CoV-2 target nucleic acids are NOT DETECTED.  The SARS-CoV-2 RNA is generally detectable in upper respiratory specimens during the acute phase of infection. The lowest concentration of SARS-CoV-2 viral copies this assay can detect is 138 copies/mL. A negative result does not preclude SARS-Cov-2 infection and should not be used as the sole basis for treatment or other patient management decisions. A negative result may occur with  improper specimen collection/handling, submission of specimen other than nasopharyngeal swab, presence of viral mutation(s) within the areas targeted by this assay, and inadequate number of viral copies(<138 copies/mL). A negative result must be combined with clinical observations, patient history, and epidemiological information. The expected result is Negative.  Fact Sheet for Patients:  EntrepreneurPulse.com.au  Fact Sheet for Healthcare Providers:  IncredibleEmployment.be  This test is no t yet approved or cleared by the [place] FDA and  has been authorized for detection and/or diagnosis of SARS-CoV-2 by FDA under an Emergency Use Authorization (EUA). This EUA will remain  in effect (meaning this test can be used) for the duration of the COVID-19 declaration under Section 564(b)(1) of the Act, 21 U.S.C.section 360bbb-3(b)(1), unless the authorization is terminated  or revoked sooner.       Influenza A by PCR NEGATIVE NEGATIVE Final   Influenza B by PCR NEGATIVE NEGATIVE Final     Comment: (NOTE) The Xpert Xpress SARS-CoV-2/FLU/RSV plus assay is intended as an aid in the diagnosis of influenza from Nasopharyngeal swab specimens and should not be used as a sole basis for treatment. Nasal washings and aspirates are unacceptable for Xpert Xpress SARS-CoV-2/FLU/RSV testing.  Fact Sheet for Patients: EntrepreneurPulse.com.au  Fact Sheet for Healthcare Providers: IncredibleEmployment.be  This test is not yet approved or cleared by the [place] FDA and has been authorized for detection and/or diagnosis of SARS-CoV-2 by FDA under an Emergency Use Authorization (EUA). This EUA will remain in effect (meaning this test can be used) for the duration of the COVID-19 declaration under Section 564(b)(1) of the Act, 21 U.S.C. section 360bbb-3(b)(1), unless the authorization is terminated or revoked.  Performed  at [facility], [address] [place],  [address]      Radiology Studies: CT ANGIO HEAD NECK W WO CM  Result Date: 05/18/2021 CLINICAL DATA:  Left-sided weakness.2 EXAM: CT ANGIOGRAPHY HEAD AND NECK TECHNIQUE: Multidetector CT imaging of the head and neck was performed using the standard protocol during bolus administration of intravenous contrast. Multiplanar CT image reconstructions and MIPs were obtained to evaluate the vascular anatomy. Carotid stenosis measurements (when applicable) are obtained utilizing NASCET criteria, using the distal internal carotid diameter as the denominator. CONTRAST:  17mL OMNIPAQUE IOHEXOL 350 MG/ML SOLN COMPARISON:  Brain MRI from yesterday FINDINGS: CT HEAD FINDINGS Brain: Very subtle white matter infarct when compared to prior brain MRI. No evidence of progression, hemorrhage, hydrocephalus, or mass. Chronic small vessel ischemia Vascular: See below Skull: Negative Sinuses: Negative Orbits: Negative Review of the MIP images confirms the above findings CTA NECK FINDINGS Aortic  arch: Extensive atheromatous plaque with irregular luminal thrombus. Right carotid system: Calcified plaque at the origin without flow limiting stenosis. Partial retropharyngeal course. 60% stenosis at the right ICA origin. No ulceration or dissection. Left carotid system: Atheromatous plaque greatest at the bifurcation with 55% narrowing at the left ICA origin due to calcified plaque. No limiting stenosis or ulceration. Vertebral arteries: Proximal subclavian atherosclerosis without flow reducing stenosis. Calcified plaque at the left vertebral origin, associated stenosis not assessed due to motion artifact. Skeleton: No acute finding Other neck: No acute finding Upper chest: Airway thickening. Review of the MIP images confirms the above findings CTA HEAD FINDINGS Anterior circulation: Atheromatous calcification along the carotid siphons. No branch occlusion, beading, or aneurysm. Mild atheromatous undulation attributed to atherosclerosis. Posterior circulation: Vertebral and basilar arteries are smoothly contoured and widely patent. Atheromatous calcification to a mild degree along the V4 segments. Moderate atheromatous narrowing at the right P2 segment. Venous sinuses: Unremarkable Anatomic variants: None significant Review of the MIP images confirms the above findings IMPRESSION: 1. No emergent finding. 2. Atherosclerosis most notably causing bilateral proximal ICA stenosis measuring 60% on the right and 55% on the left. 3. Especially advanced atherosclerosis of the aorta with 2 sites of luminal thrombus. Electronically Signed   By: Jorje Guild M.D.   On: 05/18/2021 12:07   DG Chest 2 View  Result Date: 05/17/2021 CLINICAL DATA:  Shortness of breath EXAM: CHEST - 2 VIEW COMPARISON:  Radiographs done on 08/11/2020 and CT done on 02/08/2021 FINDINGS: Transverse diameter of heart is increased. There are no signs of alveolar pulmonary edema. There is transverse linear infiltrate in the right parahilar  region. Rest of the lung fields are essentially clear. There is no significant pleural effusion or pneumothorax. There is previous vertebroplasty in the thoracic spine. IMPRESSION: Cardiomegaly. There is transverse linear infiltrate in the right parahilar region suggesting atelectasis/pneumonia. Electronically Signed   By: Elmer Picker M.D.   On: 05/17/2021 10:30   CT HEAD WO CONTRAST  Result Date: 05/17/2021 CLINICAL DATA:  Left-sided weakness EXAM: CT HEAD WITHOUT CONTRAST TECHNIQUE: Contiguous axial images were obtained from the base of the skull through the vertex without intravenous contrast. COMPARISON:  None. FINDINGS: Brain: There are no signs of bleeding within the cranium. Ventricles are not dilated. Cortical sulci are prominent. There is decreased density in the periventricular and subcortical white matter. Calcifications are seen in the basal ganglia. Vascular: There are scattered arterial calcifications. Skull: Unremarkable. Sinuses/Orbits: There is mucosal thickening in the frontal and ethmoid sinuses. There are small calcifications in the region of optic  discs in both optic globes suggesting possible optic disc drusen. Other: None IMPRESSION: No acute intracranial findings are seen in noncontrast CT brain. Atrophy. Small-vessel disease. Chronic sinusitis. Electronically Signed   By: Elmer Picker M.D.   On: 05/17/2021 10:55   CT Angio Chest PE W and/or Wo Contrast  Result Date: 05/17/2021 CLINICAL DATA:  Left-sided weakness, fatigue EXAM: CT ANGIOGRAPHY CHEST WITH CONTRAST TECHNIQUE: Multidetector CT imaging of the chest was performed using the standard protocol during bolus administration of intravenous contrast. Multiplanar CT image reconstructions and MIPs were obtained to evaluate the vascular anatomy. CONTRAST:  69mL OMNIPAQUE IOHEXOL 350 MG/ML SOLN COMPARISON:  02/08/2021 FINDINGS: Cardiovascular: Mild cardiomegaly. Trace pericardial effusion. The RV is nondilated.  Satisfactory opacification of pulmonary arteries noted, and there is no evidence of pulmonary emboli. Coronary calcifications. Fair contrast opacification of the thoracic aorta. No dissection or stenosis. Atheromatous nonaneurysmal ascending segment. Classic 3 vessel brachiocephalic arterial origin anatomy from the arch without proximal stenosis. Coarse calcified plaque in the arch and did descend distal descending thoracic aorta. Stable eccentric mural thrombus in the distal arch and proximal and mid descending thoracic aorta. 4 cm fusiform aneurysmal dilatation of the distal descending segment, with eccentric nonocclusive mural thrombus. Visualized proximal abdominal aorta is mildly atheromatous, normal in caliber. Mediastinum/Nodes: Small hiatal hernia.  No mass or adenopathy. Lungs/Pleura: No pleural effusion. Platelike atelectasis or consolidation in the anterior right upper lobe. Upper Abdomen: Cholecystectomy clips. Small hiatal hernia. No acute findings. Musculoskeletal: Previous cement augmentation of T9 and T10 vertebral bodies. Stable compression deformities of T6 and T7. No acute fracture. Review of the MIP images confirms the above findings. IMPRESSION: 1. Negative for acute PE or thoracic aortic dissection. 2. Extensive eccentric mural thrombus in the descending thoracic aorta, representing potential source of distal emboli. 3. 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 Recommend semi-annual imaging followup by CTA or MRA and referral to cardiothoracic surgery if not already obtained. This recommendation follows 2010 ACCF/AHA/AATS/ACR/ASA/SCA/SCAI/SIR/STS/SVM Guidelines for the Diagnosis and Management of Patients With Thoracic Aortic Disease. Circulation. 2010; 121: e266-e36 4. Platelike atelectasis/consolidation in the in the anterior right upper lobe obscuring the previously noted nodule, presumably representing radiation change. Electronically Signed   By: Lucrezia Europe M.D.   On:  05/17/2021 18:53   MR BRAIN WO CONTRAST  Result Date: 05/17/2021 CLINICAL DATA:  Neuro deficit, acute, stroke suspected EXAM: MRI HEAD WITHOUT CONTRAST TECHNIQUE: Multiplanar, multiecho pulse sequences of the brain and surrounding structures were obtained without intravenous contrast. COMPARISON:  Same day CT head. FINDINGS: Mildly motion limited study. 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 Mild associated edema without mass effect. Moderate additional scattered T2/FLAIR hyperintensities within the white matter, nonspecific but compatible with chronic microvascular ischemic disease. No evidence of acute hemorrhage, hydrocephalus, mass lesion, midline shift, or extra-axial fluid collection Vascular: Major arterial flow voids are maintained at the skull base. Skull and upper cervical spine: Normal marrow signal. Sinuses/Orbits: Mild paranasal sinus mucosal thickening. Unremarkable orbits. Other: No mastoid effusions. IMPRESSION: 1. 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 Mild associated edema without mass effect. 2. Moderate chronic microvascular ischemic disease. Electronically Signed   By: Margaretha Sheffield M.D.   On: 05/17/2021 17:45   ECHOCARDIOGRAM COMPLETE  Result Date: 05/18/2021    ECHOCARDIOGRAM REPORT   Patient Name:   Marissa Fletcher Date of Exam: 05/17/2021 Medical Rec #:  [id number]       Height:  62.0 in Accession #:    [id number]      Weight:       143.0 lb Date of Birth:  1940-07-05       BSA:          1.658 m Patient Age:    86 years        BP:           122/58 mmHg Patient Gender: F               HR:           94 bpm. Exam Location:  [facility] Procedure: 2D Echo, Cardiac Doppler and Color Doppler Indications:     I63.9 Stroke  History:         Patient has no prior history of Echocardiogram examinations.                  COPD; Risk Factors:Hypertension and Diabetes.  Sonographer:     Cresenciano Lick RDCS Referring Phys:   [id number] Otila Kluver LAI Diagnosing Phys: Kate Sable MD IMPRESSIONS  1. Left ventricular ejection fraction, by estimation, is 35 to 40%. The left ventricle has moderately decreased function. The left ventricle demonstrates global hypokinesis. There is mild left ventricular hypertrophy. Left ventricular diastolic parameters are consistent with Grade II diastolic dysfunction (pseudonormalization).  2. Right ventricular systolic function is normal. The right ventricular size is normal.  3. Left atrial size was mild to moderately dilated.  4. The mitral valve is normal in structure. Mild mitral valve regurgitation.  5. The aortic valve is tricuspid. Aortic valve regurgitation is not visualized.  6. The inferior vena cava is dilated in size with <50% respiratory variability, suggesting right atrial pressure of 15 mmHg. FINDINGS  Left Ventricle: Left ventricular ejection fraction, by estimation, is 35 to 40%. The left ventricle has moderately decreased function. The left ventricle demonstrates global hypokinesis. The left ventricular internal cavity size was normal in size. There is mild left ventricular hypertrophy. Left ventricular diastolic parameters are consistent with Grade II diastolic dysfunction (pseudonormalization). Right Ventricle: The right ventricular size is normal. No increase in right ventricular wall thickness. Right ventricular systolic function is normal. Left Atrium: Left atrial size was mild to moderately dilated. Right Atrium: Right atrial size was normal in size. Pericardium: There is no evidence of pericardial effusion. Mitral Valve: The mitral valve is normal in structure. Mild mitral valve regurgitation. Tricuspid Valve: The tricuspid valve is normal in structure. Tricuspid valve regurgitation is not demonstrated. Aortic Valve: The aortic valve is tricuspid. Aortic valve regurgitation is not visualized. Aortic valve peak gradient measures 8.3 mmHg. Pulmonic Valve: The pulmonic valve was normal in  structure. Pulmonic valve regurgitation is not visualized. Aorta: The aortic root and ascending aorta are structurally normal, with no evidence of dilitation. Venous: The inferior vena cava is dilated in size with less than 50% respiratory variability, suggesting right atrial pressure of 15 mmHg. IAS/Shunts: No atrial level shunt detected by color flow Doppler.  LEFT VENTRICLE PLAX 2D LVIDd:         5.50 cm Diastology LVIDs:         4.20 cm LV e' medial:    7.51 cm/s LV PW:         1.10 cm LV E/e' medial:  14.4 LV IVS:        1.10 cm LV e' lateral:   7.72 cm/s  LV E/e' lateral: 14.0  RIGHT VENTRICLE             IVC RV Basal diam:  3.60 cm     IVC diam: 2.10 cm RV S prime:     15.50 cm/s TAPSE (M-mode): 2.3 cm LEFT ATRIUM             Index        RIGHT ATRIUM           Index LA diam:        4.60 cm 2.77 cm/m   RA Area:     11.90 cm LA Vol (A2C):   52.3 ml 31.55 ml/m  RA Volume:   27.70 ml  16.71 ml/m LA Vol (A4C):   71.4 ml 43.07 ml/m LA Biplane Vol: 64.5 ml 38.91 ml/m  AORTIC VALVE AV Vmax:      144.00 cm/s AV Peak Grad: 8.3 mmHg  AORTA Ao Root diam: 3.50 cm Ao Asc diam:  3.30 cm MITRAL VALVE MV Area (PHT): 3.27 cm MV Decel Time: 232 msec MV E velocity: 108.00 cm/s MV A velocity: 99.50 cm/s MV E/A ratio:  1.09 Kate Sable MD Electronically signed by Kate Sable MD Signature Date/Time: 05/18/2021/4:40:37 PM    Final     Scheduled Meds:  aspirin EC  81 mg Oral Daily   atorvastatin  40 mg Oral Daily   clopidogrel  75 mg Oral Daily   doxepin  50 mg Oral QHS   enoxaparin (LOVENOX) injection  1 mg/kg Subcutaneous BID   ferrous [id number] C-folic acid  1 capsule Oral TID PC   gabapentin  300 mg Oral QHS   nicotine  21 mg Transdermal Daily   PARoxetine  30 mg Oral [id number]   Continuous Infusions:   LOS: 1 day   Time spent: 40 minutes. More than 50% of the time was spent in counseling/coordination of care  Lorella Nimrod, MD [facility]  If 7PM-7AM,  please contact night-coverage [email or web address]  05/18/2021, 4:46 PM   This record has been created using Systems analyst. Errors have been sought and corrected,but may not always be located. Such creation errors do not reflect on the standard of care.

## 2021-05-19 ENCOUNTER — Inpatient Hospital Stay: Payer: Medicare Other

## 2021-05-19 ENCOUNTER — Encounter: Payer: Self-pay | Admitting: Hospitalist

## 2021-05-19 DIAGNOSIS — I6389 Other cerebral infarction: Secondary | ICD-10-CM

## 2021-05-19 DIAGNOSIS — I429 Cardiomyopathy, unspecified: Secondary | ICD-10-CM | POA: Diagnosis not present

## 2021-05-19 DIAGNOSIS — F17210 Nicotine dependence, cigarettes, uncomplicated: Secondary | ICD-10-CM | POA: Diagnosis not present

## 2021-05-19 DIAGNOSIS — I110 Hypertensive heart disease with heart failure: Secondary | ICD-10-CM

## 2021-05-19 DIAGNOSIS — I639 Cerebral infarction, unspecified: Secondary | ICD-10-CM

## 2021-05-19 DIAGNOSIS — I5021 Acute systolic (congestive) heart failure: Secondary | ICD-10-CM

## 2021-05-19 DIAGNOSIS — I10 Essential (primary) hypertension: Secondary | ICD-10-CM

## 2021-05-19 LAB — BASIC METABOLIC PANEL
Anion gap: 6 (ref 5–15)
BUN: 7 mg/dL — ABNORMAL LOW (ref 8–23)
CO2: 25 mmol/L (ref 22–32)
Calcium: 7.6 mg/dL — ABNORMAL LOW (ref 8.9–10.3)
Chloride: 105 mmol/L (ref 98–111)
Creatinine, Ser: 0.58 mg/dL (ref 0.44–1.00)
GFR, Estimated: >60 mL/min (ref >60)
Glucose, Bld: 122 mg/dL — ABNORMAL HIGH (ref 70–99)
Potassium: 4.6 mmol/L (ref 3.5–5.1)
Sodium: 136 mmol/L (ref 135–145)

## 2021-05-19 LAB — CBC
HCT: 30.8 % — ABNORMAL LOW (ref 36.0–46.0)
Hemoglobin: 9.3 g/dL — ABNORMAL LOW (ref 12.0–15.0)
MCH: 24 pg — ABNORMAL LOW (ref 26.0–34.0)
MCHC: 30.2 g/dL (ref 30.0–36.0)
MCV: 79.6 fL — ABNORMAL LOW (ref 80.0–100.0)
Platelets: 327 10*3/uL (ref 150–400)
RBC: 3.87 MIL/uL (ref 3.87–5.11)
RDW: 17 % — ABNORMAL HIGH (ref 11.5–15.5)
WBC: 7.5 10*3/uL (ref 4.0–10.5)
nRBC: 0 % (ref 0.0–0.2)

## 2021-05-19 LAB — MAGNESIUM: Magnesium: 2.2 mg/dL (ref 1.7–2.4)

## 2021-05-19 IMAGING — US US EXTREM LOW VENOUS
1 series · 13 of 24 positions shown · non-contrast
Comparison: None.

CLINICAL DATA: Lower extremity pain and edema.



[Series 1: us venous img lower bilat (dvt) · portal-venous · 13 of 56 slices shown]
[im 1/56]
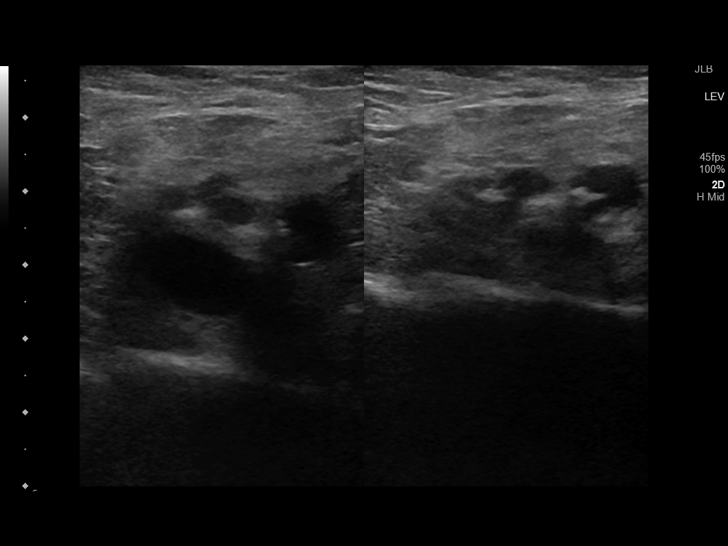
[im 5/56]
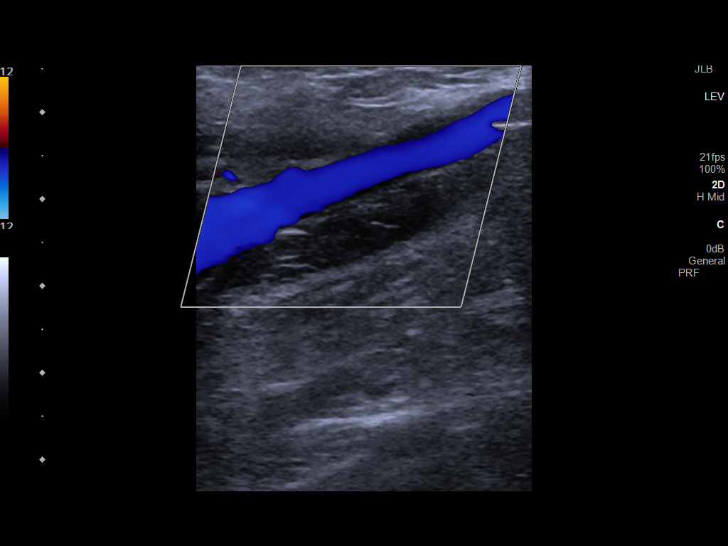
[im 10/56]
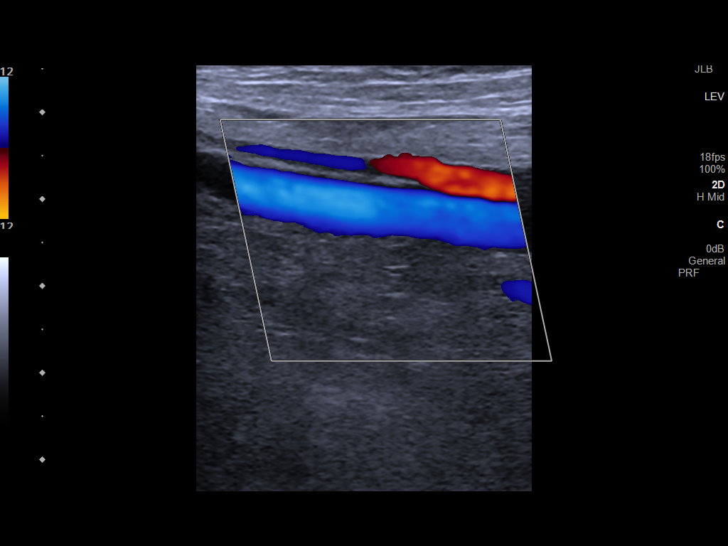
[im 15/56]
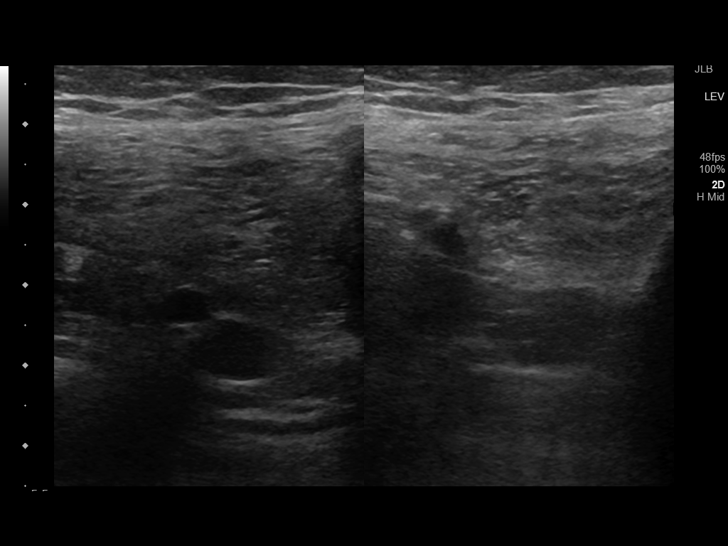
[im 20/56]
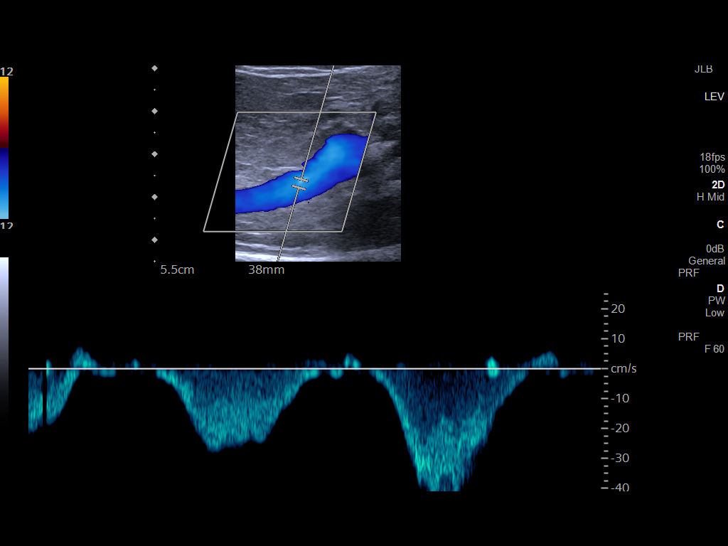
[im 24/56]
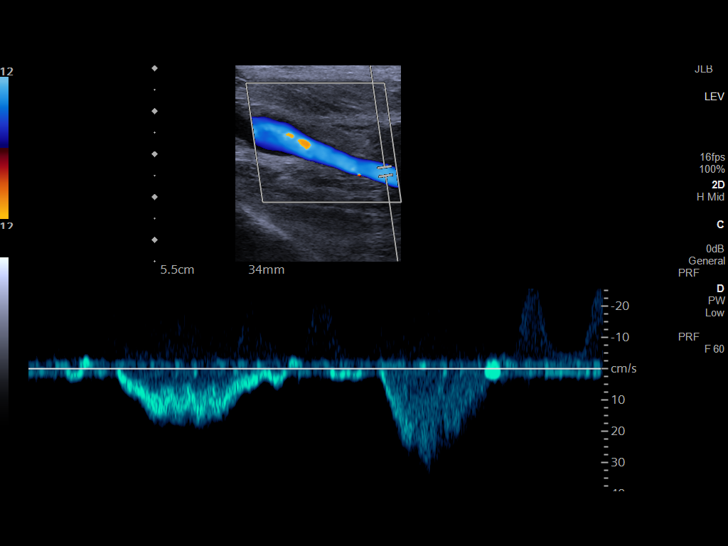
[im 29/56]
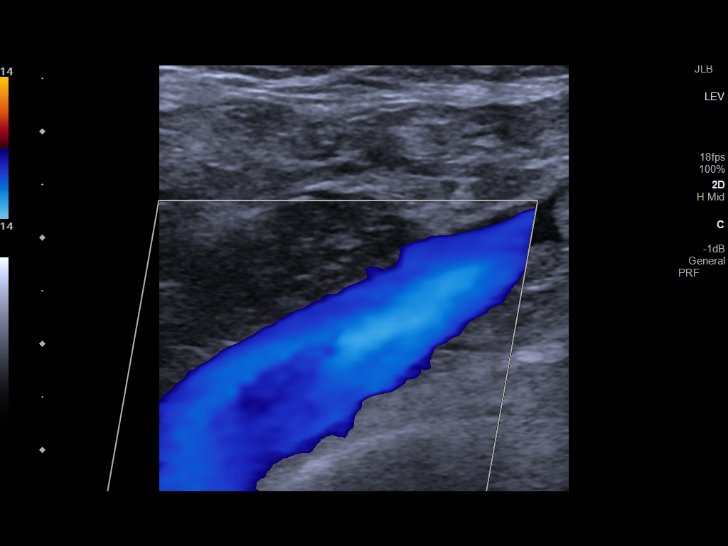
[im 32/56]
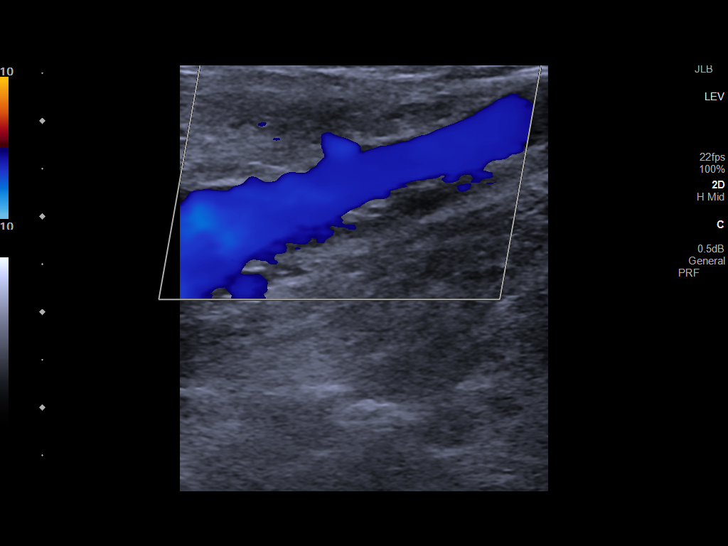
[im 36/56]
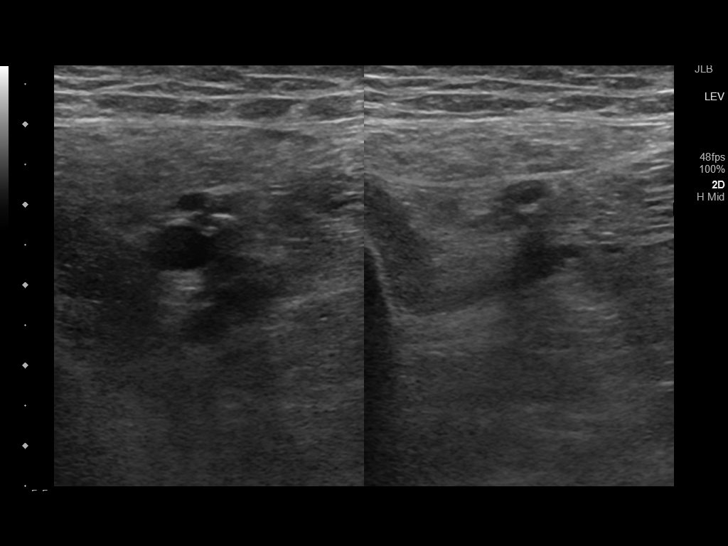
[im 41/56]
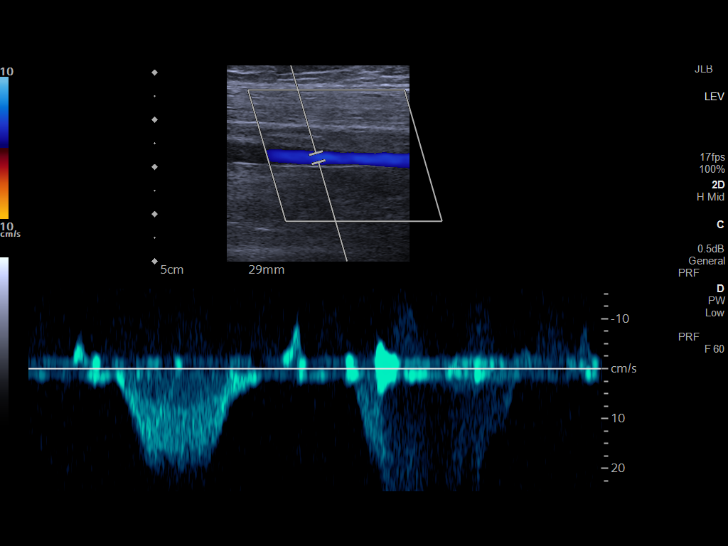
[im 46/56]
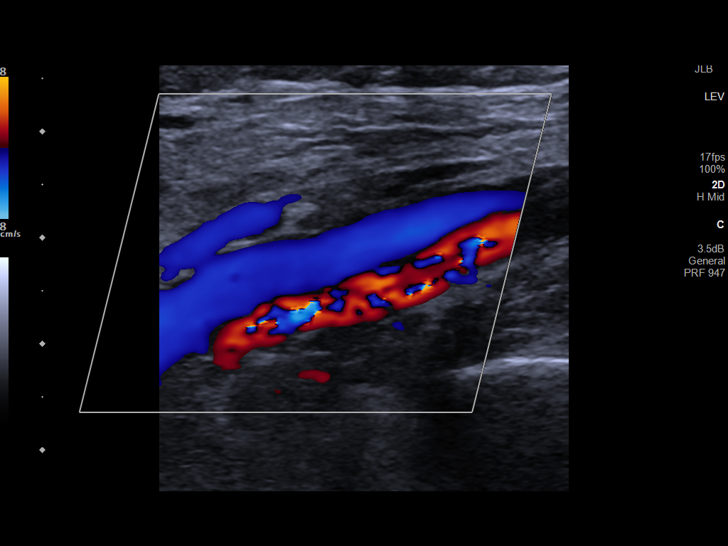
[im 51/56]
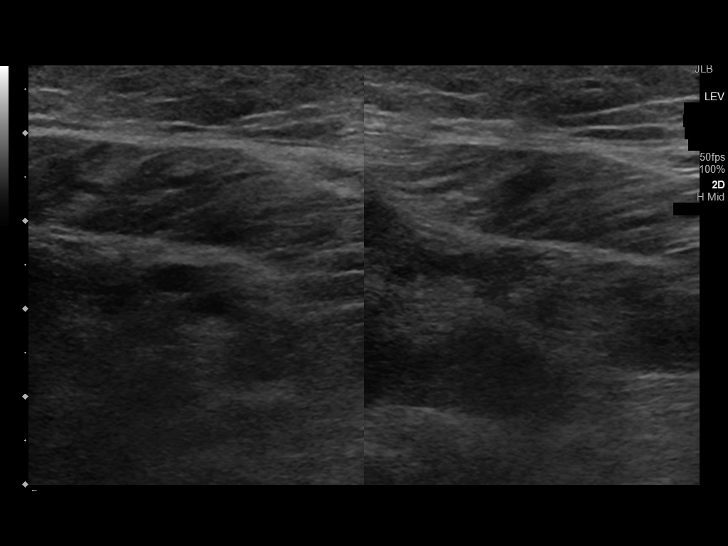
[im 56/56]
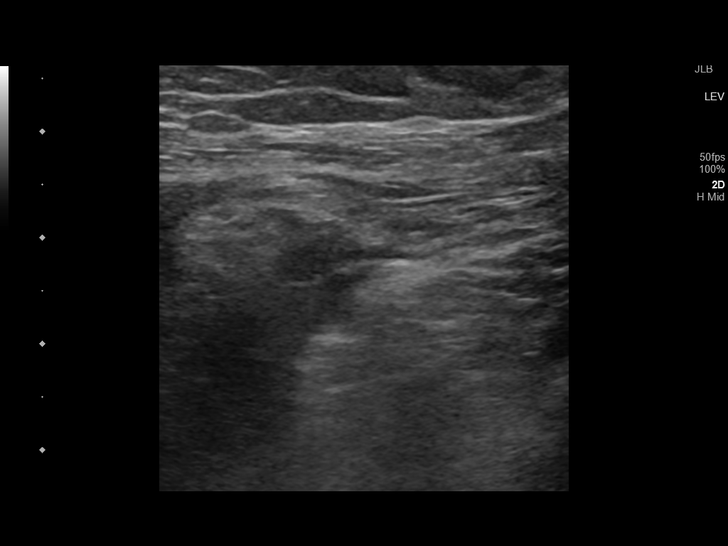

[13 of 24 positions shown; findings below may reference images not displayed]

FINDINGS: RIGHT LOWER EXTREMITY

Common Femoral Vein: No evidence of thrombus. Normal
compressibility, respiratory phasicity and response to augmentation.

Saphenofemoral Junction: No evidence of thrombus. Normal
compressibility and flow on color Doppler imaging.

Profunda Femoral Vein: No evidence of thrombus. Normal
compressibility and flow on color Doppler imaging.

Femoral Vein: No evidence of thrombus. Normal compressibility,
respiratory phasicity and response to augmentation.

Popliteal Vein: No evidence of thrombus. Normal compressibility,
respiratory phasicity and response to augmentation.

Calf Veins: No evidence of thrombus. Normal compressibility and flow
on color Doppler imaging.

Superficial Great Saphenous Vein: No evidence of thrombus. Normal
compressibility.

Venous Reflux:  None.

Other Findings: No evidence of superficial thrombophlebitis or
abnormal fluid collection.

LEFT LOWER EXTREMITY

Common Femoral Vein: No evidence of thrombus. Normal
compressibility, respiratory phasicity and response to augmentation.

Saphenofemoral Junction: No evidence of thrombus. Normal
compressibility and flow on color Doppler imaging.

Profunda Femoral Vein: No evidence of thrombus. Normal
compressibility and flow on color Doppler imaging.

Femoral Vein: No evidence of thrombus. Normal compressibility,
respiratory phasicity and response to augmentation.

Popliteal Vein: No evidence of thrombus. Normal compressibility,
respiratory phasicity and response to augmentation.

Calf Veins: No evidence of thrombus. Normal compressibility and flow
on color Doppler imaging.

Superficial Great Saphenous Vein: No evidence of thrombus. Normal
compressibility.

Venous Reflux:  None.

Other Findings: Small, thin fluid collection of the left popliteal
fossa measures approximately 1.9 x 0.3 x 1.6 cm and is consistent
with a Baker's cyst. No evidence of superficial thrombophlebitis.
IMPRESSION: No evidence of deep venous thrombosis in either lower extremity.
Small left popliteal fossa Baker's cyst.

## 2021-05-19 MED ORDER — ALPRAZOLAM 0.25 MG PO TABS
0.2500 mg | ORAL_TABLET | Freq: Three times a day (TID) | ORAL | Status: DC | PRN
  Administered 2021-05-19 – 2021-05-20 (×2): 0.25 mg via ORAL
  Filled 2021-05-19 (×2): qty 1

## 2021-05-19 MED ORDER — DAPAGLIFLOZIN PROPANEDIOL 10 MG PO TABS
10.0000 mg | ORAL_TABLET | Freq: Every day | ORAL | Status: DC
  Administered 2021-05-19 – 2021-05-20 (×2): 10 mg via ORAL
  Filled 2021-05-19 (×2): qty 1

## 2021-05-19 MED ORDER — ATORVASTATIN CALCIUM 20 MG PO TABS
80.0000 mg | ORAL_TABLET | Freq: Every day | ORAL | Status: DC
  Administered 2021-05-19 – 2021-05-20 (×2): 80 mg via ORAL
  Filled 2021-05-19 (×2): qty 4

## 2021-05-19 MED ORDER — CARVEDILOL 3.125 MG PO TABS
3.1250 mg | ORAL_TABLET | Freq: Two times a day (BID) | ORAL | Status: DC
  Administered 2021-05-19 – 2021-05-20 (×2): 3.125 mg via ORAL
  Filled 2021-05-19 (×2): qty 1

## 2021-05-19 NOTE — Progress Notes (Signed)
Physical Therapy Treatment Patient Details Name: Marissa Fletcher MRN: [id number] DOB: [birth year] Today's Date: 05/19/2021   History of Present Illness [name] who presented to [facility] ED yesterday 12//27/22 due to left sided weakness and fatigue upon waking up. 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    PT Comments    Pt received supine in bed agreeable to PT. Per RN and pt, pt had "anxiety attack" earlier but is better now. Resting HR in low 100's with SPO2 at 98% on 2L/min [place]. Pt is mod-I with bed mobility but relies on increased time and effort with HOB elevated and use of bed rails. Minor dizziness reported with sitting but dissipates after 1 min sitting at EOB. Pt required extensive cuing and education on safe use of Ue's to stand to RW. Initially pt pulling on RW with post LOB despite education. Pt educated on anterior trunk lean and hand placement with fair carryover and able to perform with minguard and progress to ambulating 20' in room. Safe use of RW with turns and is steady, however limited due to endurance and SOB. Per pt SOB leading to additional "anxiety attack" sitting EoB with HR elevating up to 142 BPM. With education on PLB, able to decrease to 118-119 BPM after 5 min of seated rest with decreased RR noted and ability to take deeper, longer breaths. MinA for LE management back to bed with all needs in reach. RN aware of HR. Due to poor safety awareness/safe use of DME, decreased endurance with OOB activity, PT to heavily recommend STR upon d/c due to no caregiver support.    Recommendations for follow up therapy are one component of a multi-disciplinary discharge planning process, led by the attending physician.  Recommendations may be updated based on patient status, additional functional criteria and insurance authorization.  Follow Up Recommendations  Skilled nursing-short term rehab (<3  hours/day)     Assistance Recommended at Discharge    Equipment Recommendations  Other (comment) (tbd by next venue of care)    Recommendations for Other Services       Precautions / Restrictions Precautions Precautions: Fall Restrictions Weight Bearing Restrictions: No     Mobility  Bed Mobility Overal bed mobility: Modified Independent             General bed mobility comments: Increased time and effort, increased use of BUE to pull into sitting, EOB elevated Patient Response: Anxious  Transfers Overall transfer level: Needs assistance Equipment used: Rolling walker (2 wheels) Transfers: Sit to/from Stand Sit to Stand: Min guard;From elevated surface           General transfer comment: Pt required mod VC's for safe hand placement and anterior lean to improve ability to stand.    Ambulation/Gait Ambulation/Gait assistance: Min guard Gait Distance (Feet): 20 Feet Assistive device: Rolling walker (2 wheels) Gait Pattern/deviations: Step-to pattern;Narrow base of support;Trunk flexed;Decreased step length - right;Decreased step length - left       General Gait Details: no LOB noted, mild unsteadiness on feet   Stairs             Wheelchair Mobility    Modified Rankin (Stroke Patients Only)       Balance Overall balance assessment: Needs assistance Sitting-balance support: Single extremity supported;Feet supported Sitting balance-Leahy Scale: Good     Standing balance support: Bilateral upper extremity supported;During functional activity Standing balance-Leahy Scale: Fair Standing balance comment: Requires  BUE support on RW.                            Cognition Arousal/Alertness: Awake/alert Behavior During Therapy: WFL for tasks assessed/performed Overall Cognitive Status: Within Functional Limits for tasks assessed                                          Exercises General Exercises - Lower  Extremity Ankle Circles/Pumps: AROM;Supine;Both;10 reps Heel Slides: AROM;Strengthening;Both;10 reps;Supine Hip ABduction/ADduction: AROM;Supine;Strengthening;Both;10 reps Straight Leg Raises: AROM;Supine;Both;10 reps Other Exercises Other Exercises: safe use of DME with transfers, PLB    General Comments General comments (skin integrity, edema, etc.): Max HR up to 142 BPM post ambulation due to reports of SOB and "anxiety attack". Returns to 119 BPM after 5 min seated rest and education on PLB. SPO2 remained >90% at rest and post ambulation on 2L/min [place].      Pertinent Vitals/Pain      Home Living                          Prior Function            PT Goals (current goals can now be found in the care plan section) Acute Rehab PT Goals Patient Stated Goal: to go home PT Goal Formulation: With patient Time For Goal Achievement: 06/01/21 Potential to Achieve Goals: Fair Progress towards PT goals: Progressing toward goals    Frequency    7X/week      PT Plan Current plan remains appropriate    Co-evaluation              AM-PAC PT "6 Clicks" Mobility   Outcome Measure  Help needed turning from your back to your side while in a flat bed without using bedrails?: A Little Help needed moving from lying on your back to sitting on the side of a flat bed without using bedrails?: A Little Help needed moving to and from a bed to a chair (including a wheelchair)?: A Little Help needed standing up from a chair using your arms (e.g., wheelchair or bedside chair)?: A Lot Help needed to walk in hospital room?: A Little Help needed climbing 3-5 steps with a railing? : A Lot 6 Click Score: 16    End of Session Equipment Utilized During Treatment: Gait belt;Oxygen Activity Tolerance: Other (comment) ("anxiety attack" per pt) Patient left: in bed;with call bell/phone within reach;with bed alarm set;with family/visitor present Nurse Communication: Mobility status PT  Visit Diagnosis: Unsteadiness on feet (R26.81);Muscle weakness (generalized) (M62.81);Difficulty in walking, not elsewhere classified (R26.2);Other abnormalities of gait and mobility (R26.89)     Time: 9476-5465 PT Time Calculation (min) (ACUTE ONLY): 20 min  Charges:  $Therapeutic Exercise: 8-22 mins                     [name]. Fairly IV, PT, DPT Physical Therapist- [facility]  05/19/2021, 3:53 PM

## 2021-05-19 NOTE — Progress Notes (Signed)
PT Cancellation Note  Patient Details Name: Marissa Fletcher MRN: [id number] DOB: Oct 04, 1940   Cancelled Treatment:    Reason Eval/Treat Not Completed: Other (comment). Per RN, pt experiencing panic attack like symptoms. Requesting PT to hold. Will re-attempt in afternoon as medically appropriate and available.   [name]. Fairly IV, PT, DPT Physical Therapist- [facility]  05/19/2021, 11:26 AM

## 2021-05-19 NOTE — Consult Note (Signed)
Cardiology Consult    Patient ID: Marissa Fletcher MRN: [id number], DOB/AGE: [birth year]   Admit date: 05/17/2021 Date of Consult: 05/19/2021  Primary Physician: Juluis Pitch, MD Primary Cardiologist: Nelva Bush, MD Requesting Provider: Chauncey Cruel. Reesa Chew, MD  Patient Profile    Marissa Fletcher is a 80 y.o. female with a history of hypertension, hyperlipidemia, diabetes, tobacco abuse, lung cancer, and anxiety, who is being seen today for the evaluation of cardiomyopathy at the request of Dr. Reesa Chew.  Past Medical History   Past Medical History:  Diagnosis Date   Anxiety    Aortic atherosclerosis ([place])    Arthritis    Cardiomyopathy ([place])    a. 04/2021 Echo: EF 35-40%, glob HK. GrII DD.  Nl RV size/fxn. Mod dil LA. Mild MR.   Carotid arterial disease ([place])    a. 04/2021 CTA Head/neck: RICA 60, LICA 55.   COPD (chronic obstructive pulmonary disease) ([facility])    Depression    Diabetes mellitus without complication ([place])    History of kidney stones    Hypertension    Lung cancer ([place])    Stroke (cerebrum) ([place])    a. 04/2021 MRI brain: Acute infarct post limb of R internal capsule.   Thoracic aortic aneurysm    Tobacco abuse     Past Surgical History:  Procedure Laterality Date   ABDOMINAL HYSTERECTOMY     CHOLECYSTECTOMY     EYE SURGERY Bilateral    KYPHOPLASTY N/A 07/30/2020   Procedure: T10 Kyphoplasty;  Surgeon: Hessie Knows, MD;  Location: [facility] ORS;  Service: Orthopedics;  Laterality: N/A;  T10   KYPHOPLASTY N/A 08/19/2020   Procedure: T9 YPHOPLASTY;  Surgeon: Hessie Knows, MD;  Location: [facility] ORS;  Service: Orthopedics;  Laterality: N/A;     Allergies  Allergies  Allergen Reactions   Sulfa Antibiotics Swelling    History of Present Illness    80 year old female with the above past medical history including hypertension, hyperlipidemia, diabetes, tobacco abuse, lung cancer, and anxiety.  She denies any prior cardiac history.  She lives locally and is relatively  sedentary.  She smokes about a half a pack a day and has done so for most of her adult life.  She was in her usual state of health until December 27, when she awoke with left arm and leg weakness resulting in difficulty walking.  She called EMS and was taken to the emergency department where she was hypoxic at 85% requiring initiation of 2 L of oxygen.  Head CT showed no acute intracranial findings.  MRI however, showed acute infarct in the posterior limb of the right internal capsule with mild associated edema without mass-effect.  Chronic microvascular ischemic disease was also noted.  Chest x-ray showed cardiomegaly with question of pneumonia.  She was treated with ceftriaxone and azithromycin in the emergency department.  ECG showed sinus rhythm with first-degree AV block.  Troponin was mildly elevated at 27.  CTA of the chest was performed and was negative for PE.  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  Platelike atelectasis and consolidation are noted in the anterior right upper lobe-presumed to be secondary to radiation changes.  Patient was admitted  and underwent CT angio of the head and neck vessels on December 28 showing bilateral internal carotid artery stenosis (AUQJ-33%, LICA-55%).  Advanced atherosclerosis of the aorta with 2 sites of luminal thrombus was also  noted.  Echocardiogram December 27 showed a new finding of LV dysfunction with an EF of 35 to 23%, grade 2 diastolic dysfunction, normal RV function, mildly to moderately dilated left atrium, and mild mitral regurgitation.  Is because of this echocardiographic finding, that we have been consulted.  Patient denies any prior history of heart failure and further denies edema, PND, orthopnea, or early satiety.  She does have some degree of chronic dyspnea on exertion in the setting of ongoing tobacco abuse and COPD.  She denies any  prior history of chest pain.  Inpatient Medications     aspirin EC  81 mg Oral Daily   atorvastatin  80 mg Oral Daily   clopidogrel  75 mg Oral Daily   doxepin  50 mg Oral QHS   enoxaparin (LOVENOX) injection  1 mg/kg Subcutaneous BID   ferrous [id number] C-folic acid  1 capsule Oral TID PC   gabapentin  300 mg Oral QHS   nicotine  21 mg Transdermal Daily   PARoxetine  30 mg Oral BH-[id number]    Family History    Family History  Problem Relation Age of Onset   Dementia Mother    Congestive Heart Failure Mother    Bladder Cancer Father    Heart disease Father    Breast cancer Neg Hx    She indicated that her mother is deceased. She indicated that her father is deceased. She indicated that the status of her neg hx is unknown.   Social History    Social History   Socioeconomic History   Marital status: Single    Spouse name: Not on file   Number of children: Not on file   Years of education: Not on file   Highest education level: Not on file  Occupational History   Not on file  Tobacco Use   Smoking status: Every Day    Packs/day: 0.50    Years: 68.00    Pack years: 34.00    Types: Cigarettes   Smokeless tobacco: Never  Vaping Use   Vaping Use: Never used  Substance and Sexual Activity   Alcohol use: Never   Drug use: Never   Sexual activity: Not on file  Other Topics Concern   Not on file  Social History Narrative   Lives locally.  Does not routinely exercise.   Social Determinants of Health   Financial Resource Strain: Not on file  Food Insecurity: Not on file  Transportation Needs: Not on file  Physical Activity: Not on file  Stress: Not on file  Social Connections: Not on file  Intimate Partner Violence: Not on file     Review of Systems    General:  No chills, fever, night sweats or weight changes.  Cardiovascular:  No chest pain, +++ dyspnea on exertion, no edema, orthopnea, palpitations, paroxysmal nocturnal dyspnea. Dermatological: No  rash, lesions/masses Respiratory: No cough, dyspnea Urologic: No hematuria, dysuria Abdominal:   No nausea, vomiting, diarrhea, bright red blood per rectum, melena, or hematemesis Neurologic:  No visual changes, +++ left upper and lower extremity wkns-which has been improving, no changes in mental status. All other systems reviewed and are otherwise negative except as noted above.  Physical Exam    Blood pressure (!) 151/87, pulse (!) 112, temperature 98.2 F (36.8 C), temperature source Oral, resp. rate 20, height 5\' 2"  (1.575 m), weight 65.7 kg, SpO2 93 %.  General: Pleasant, NAD Psych: Normal affect. Neuro: Alert and oriented X 3.  Strength 5/5 on the  right, 4/5 on the left HEENT: Normal  Neck: Supple without bruits or JVD. Lungs:  Resp regular and unlabored, diminished breath sounds bilaterally, more so in the left base, expiratory wheezing noted on anterior exam. Heart: RRR no s3, s4, or murmurs. Abdomen: Soft, non-tender, non-distended, BS + x 4.  Extremities: No clubbing, cyanosis or edema. DP/PT1+, Radials 2+ and equal bilaterally.  Labs    Cardiac Enzymes Recent Labs  Lab 05/17/21 0956  TROPONINIHS 27*      Lab Results  Component Value Date   WBC 7.5 05/19/2021   HGB 9.3 (L) 05/19/2021   HCT 30.8 (L) 05/19/2021   MCV 79.6 (L) 05/19/2021   PLT 327 05/19/2021    Recent Labs  Lab 05/17/21 0956 05/18/21 0507 05/19/21 0448  NA 137   < > 136  K 2.9*   < > 4.6  CL 99   < > 105  CO2 29   < > 25  BUN 9   < > 7*  CREATININE 0.63   < > 0.58  CALCIUM 8.0*   < > 7.6*  PROT 7.0  --   --   BILITOT 0.5  --   --   ALKPHOS 132*  --   --   ALT 12  --   --   AST 19  --   --   GLUCOSE 119*   < > 122*   < > = values in this interval not displayed.   Lab Results  Component Value Date   CHOL 147 05/17/2021   HDL 38 (L) 05/17/2021   LDLCALC 96 05/17/2021   TRIG 65 05/17/2021   Lab Results  Component Value Date   HGBA1C 6.2 (H) 05/18/2021      Radiology Studies     CT ANGIO HEAD NECK W WO CM  Result Date: 05/18/2021 CLINICAL DATA:  Left-sided weakness.2 EXAM: CT ANGIOGRAPHY HEAD AND NECK IMPRESSION: 1. No emergent finding. 2. Atherosclerosis most notably causing bilateral proximal ICA stenosis measuring 60% on the right and 55% on the left. 3. Especially advanced atherosclerosis of the aorta with 2 sites of luminal thrombus. Electronically Signed   By: Jorje Guild M.D.   On: 05/18/2021 12:07   DG Chest 2 View  Result Date: 05/17/2021 CLINICAL DATA:  Shortness of breath EXAM: CHEST - 2 VIEW COMPARISON:  Radiographs done on 08/11/2020 and CT done on 02/08/2021 FINDINGS:  IMPRESSION: Cardiomegaly. There is transverse linear infiltrate in the right parahilar region suggesting atelectasis/pneumonia. Electronically Signed   By: Elmer Picker M.D.   On: 05/17/2021 10:30   CT HEAD WO CONTRAST  Result Date: 05/17/2021 CLINICAL DATA:  Left-sided weakness EXAM: CT HEAD WITHOUT CONTRAST IMPRESSION: No acute intracranial findings are seen in noncontrast CT brain. Atrophy. Small-vessel disease. Chronic sinusitis. Electronically Signed   By: Elmer Picker M.D.   On: 05/17/2021 10:55   CT Angio Chest PE W and/or Wo Contrast  Result Date: 05/17/2021 CLINICAL DATA:  Left-sided weakness, fatigue EXAM: CT ANGIOGRAPHY CHEST WITH CONTRAST IMPRESSION: 1. Negative for acute PE or thoracic aortic dissection. 2. Extensive eccentric mural thrombus in the descending thoracic aorta, representing potential source of distal emboli. 3. 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 Recommend semi-annual imaging followup by CTA or MRA and referral to cardiothoracic surgery if not already obtained. This recommendation follows 2010 ACCF/AHA/AATS/ACR/ASA/SCA/SCAI/SIR/STS/SVM Guidelines for the Diagnosis and Management of Patients With Thoracic Aortic Disease. Circulation. 2010; 121: e266-e36 4. Platelike atelectasis/consolidation in the in the anterior  right  upper lobe obscuring the previously noted nodule, presumably representing radiation change. Electronically Signed   By: Lucrezia Europe M.D.   On: 05/17/2021 18:53   MR BRAIN WO CONTRAST  Result Date: 05/17/2021 CLINICAL DATA:  Neuro deficit, acute, stroke suspected EXAM: MRI HEAD WITHOUT CONTRAST IMPRESSION: 1. 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 Mild associated edema without mass effect. 2. Moderate chronic microvascular ischemic disease. Electronically Signed   By: Margaretha Sheffield M.D.   On: 05/17/2021 17:45   US Venous Img Lower Bilateral (DVT)  Result Date: 05/19/2021 CLINICAL DATA:  Lower extremity pain and edema. EXAM: BILATERAL LOWER EXTREMITY VENOUS DOPPLER ULTRASOUND IMPRESSION: No evidence of deep venous thrombosis in either lower extremity. Small left popliteal fossa Baker's cyst. Electronically Signed   By: Aletta Edouard M.D.   On: 05/19/2021 11:00   ECG & Cardiac Imaging    Regular sinus rhythm, 96, first-degree AV block, lateral T wave abnormalities- personally reviewed.  Assessment & Plan    1.  Acute right brain stroke: Patient admitted with left-sided weakness and found by MRI to have acute infarct in the posterior limb of the right internal capsule.  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  Echocardiogram shows an EF of 35 to 40% with global hypokinesis.  She is currently on aspirin, statin, and Plavix.  At the request of neurology, we will plan to place a Zio monitor prior to discharge and can consider longer-term implantable loop recorder if appropriate.  2.  Cardiomyopathy: Echocardiogram this admission has shown an EF of 35 to 40% with global hypokinesis and grade 2 diastolic dysfunction.  Patient has no prior history of heart failure and is euvolemic on examination.   Troponin was minimally elevated at 27 on admission.  Heart rates have been mildly elevated throughout admission and we will add low-dose beta-blocker.  Need to follow closely given baseline first-degree AV block.  If pressures tolerate initiation of carvedilol, will look to add ARB.  We will add SGLT2 inhibitor.  Following recovery of stroke, we will likely plan on stress testing, which can be performed in the outpatient setting.  3.  Essential hypertension: Blood pressure currently elevated at 151/87.  Adding low-dose beta-blocker-follow given recent stroke and presumed permissive hypertension.  4.  Hyperlipidemia: LDL of 96.  She is on a statin.  5.  Tobacco abuse: Smoking cessation advised.  6.  Lung cancer: This is followed in the outpatient setting.  7.  Type 2 diabetes mellitus: A1c 6.2.  In light of #2, will add SGLT2 inhibitor.  Signed, Murray Hodgkins, NP 05/19/2021, 12:48 PM  For questions or updates, please contact   Please consult [email or web address] for contact info under Cardiology/STEMI.

## 2021-05-19 NOTE — Progress Notes (Signed)
PROGRESS NOTE    Marissa Fletcher  JOA:[id number] DOB: 1940-05-31 DOA: 05/17/2021 PCP: Juluis Pitch, MD   Brief Narrative: Taken from H&P. Marissa Fletcher is a 80 y.o. 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  Weakness improved by the time she arrived in ED. Also having some sinus congestion and cough for the past several weeks. Patient is a lifetime smoker and still smokes. Being treated for adenocarcinoma of right upper lobe.  MRI brain shows an acute infarct in the posterior limb of right internal capsule. CTA with moderate stenosis, 50 to 60% in both external and internal carotids. CTA chest was negative for PE but did show a mural thrombus in aorta and an aortic aneurysm of 4 cm which remains unchanged as compared to prior.  Vascular surgery was consulted and they do not think that she needs any further treatment for her aortic mural thrombus, they are recommending continuation of aspirin and Plavix and outpatient follow-up with vascular surgery. Neurology is on board. PT is recommending SNF, patient wants to go home with home health.  12/29.  Cardiology was consulted for new onset HFrEF, they will do medical management and will avoid any invasive procedure due to extensive aortic thrombus.  They are just recommending DAPT at this time.  Neurology would like to start her on anticoagulation. We also talked with oncology and they do not think that the state of her underlying malignancy is responsible for any hypercoagulability.  Lower extremity venous Doppler was negative for DVT.  Subjective: Patient was seen and examined today.  She appears very anxious with all these new findings.  Denies any pain.  Sister at bedside.  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  CTA with 50 to 60% stenosis, vascular surgery does not think that she needs any intervention at this time.  Cardiology also recommending DAPT only.  Neurology strongly thinks that she needs anticoagulation. Left-sided weakness has been resolved.  She received loading doses of aspirin and Plavix and was started on DAPT. -Completed stroke work-up -PT is recommending SNF-she wants to go home -Continue with DAPT -Continue with statin-dose was increased to 40 mg daily. -Echocardiogram with EF of 30 to 35%, global hypokinesis and dilated cardiomyopathy.  New onset HFrEF.  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  Cardiology was consulted and they are planning to start her on SGLT2 and low-dose beta-blocker-some concern of underlying first-degree heart block.  They will avoid any invasive procedure because of extensive aortic thrombosis.  Patient will also need ARB before discharge if blood pressure tolerates.  Appears euvolemic so no diuretic warranted at this time.  Acute hypoxic respiratory failure.  History of COPD and underlying lung malignancy.  Saturating well on 2 L of oxygen now.  Per patient no prior oxygen use.. 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  Vascular surgery was consulted and they do not think that any intervention is warranted at this time.  Can cause future embolic strokes. -Outpatient vascular surgery follow-up. -Continue with DAPT -They are not recommending any anticoagulation except aspirin and Plavix.  Concern of a pneumonia-ruled out.  Remained afebrile with  no leukocytosis.  There was some linear infiltrates in the right perihilar region which is most likely atelectasis versus from radiation. Procalcitonin at 0.16, history of underlying malignancy.  She  received ceftriaxone and Zithromax in ED which was not continued on admission. -No need for antibiotics at this time  Hypokalemia/hypomagnesemia.  Resolved with repletion -Continue to monitor and replete as needed  History of iron deficiency anemia.  Seems stable around 9.3 today. iron studies with iron deficiency. -She was started on iron supplement  Type 2 diabetes mellitus.  On metformin at home.  A1c of 6.2 -Cardiology is adding SGLT2 -Continue to monitor  Essential hypertension.  Blood pressure within  normal limit. 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  Remained unchanged --outpatient followup with vascular surgery  Objective: Vitals:   05/19/21 0752 05/19/21 1114 05/19/21 1158 05/19/21 1633  BP: 140/75 (!) 151/87  114/62  Pulse: 100 (!) 112  (!) 107  Resp: 16 20  16   Temp: 98.5 F (36.9 C) 98.2 F (36.8 C)  99.2 F (37.3 C)  TempSrc:  Oral  Oral  SpO2: 97% 93% 93% 94%  Weight:      Height:        Intake/Output Summary (Last 24 hours) at 05/19/2021 1657 Last data filed at 05/19/2021 1355 Gross per 24 hour  Intake 420 ml  Output 300 ml  Net 120 ml    Filed Weights   05/17/21 0954 05/17/21 2203  Weight: 64.9 kg 65.7 kg    Examination:  General.  Anxious looking elderly lady, in no acute distress. Pulmonary.  Lungs clear bilaterally, normal respiratory effort. CV.  Regular rate and rhythm, no JVD, rub or murmur. Abdomen.  Soft, nontender, nondistended, BS positive. CNS.  Alert and oriented .  No significant focal neurologic deficit. Extremities.  No edema, no cyanosis, pulses intact and symmetrical. Psychiatry.  Judgment and insight appears normal.   DVT prophylaxis: Lovenox Code Status: Full Family Communication: Discussed with sister at bedside Disposition Plan:  Status is:  Inpatient  Remains inpatient appropriate because: Severity of illness   Level of care: Med-Surg  All the records are reviewed and case discussed with Care Management/Social Worker. Management plans discussed with the patient, nursing and they are in agreement.  Consultants:  Neurology Vascular surgery Cardiology Oncology  Procedures:  Antimicrobials:   Data Reviewed: I have personally reviewed following labs and imaging studies  CBC: Recent Labs  Lab 05/17/21 0956 05/18/21 0507 05/19/21 0448  WBC 6.8 5.8 7.5  NEUTROABS 5.2  --   --   HGB 9.6* 8.4* 9.3*  HCT 30.7* 27.6* 30.8*  MCV 77.1* 77.5* 79.6*  PLT 323 299 144    Basic Metabolic Panel: Recent Labs  Lab 05/17/21 0956 05/18/21 0507 05/19/21 0448  NA 137 138 136  K 2.9* 2.7* 4.6  CL 99 102 105  CO2 29 28 25   GLUCOSE 119* 107* 122*  BUN 9 8 7*  CREATININE 0.63 0.61 0.58  CALCIUM 8.0* 7.3* 7.6*  MG 1.0* 1.0* 2.2    GFR: Estimated Creatinine Clearance: 49 mL/min (by C-G formula based on SCr of 0.58 mg/dL). Liver Function Tests: Recent Labs  Lab 05/17/21 0956  AST 19  ALT 12  ALKPHOS 132*  BILITOT 0.5  PROT 7.0  ALBUMIN 3.5    No results for input(s): LIPASE, AMYLASE in the last  168 hours. No results for input(s): AMMONIA in the last 168 hours. Coagulation Profile: Recent Labs  Lab 05/17/21 0956  INR 1.0    Cardiac Enzymes: No results for input(s): CKTOTAL, CKMB, CKMBINDEX, TROPONINI in the last 168 hours. BNP (last 3 results) No results for input(s): PROBNP in the last 8760 hours. HbA1C: Recent Labs    05/18/21 0507  HGBA1C 6.2*    CBG: Recent Labs  Lab 05/17/21 2159 05/18/21 0802  GLUCAP 116* 113*    Lipid Profile: Recent Labs    05/17/21 0956  CHOL 147  HDL 38*  LDLCALC 96  TRIG 65  CHOLHDL 3.9    Thyroid Function Tests: No results for input(s): TSH, T4TOTAL, FREET4, T3FREE, THYROIDAB in the last 72 hours. Anemia Panel: Recent Labs    05/17/21 0956  TIBC  385  IRON 21*    Sepsis Labs: Recent Labs  Lab 05/17/21 0956  PROCALCITON 0.16     Recent Results (from the past 240 hour(s))  Blood culture (routine x 2)     Status: None (Preliminary result)   Collection Time: 05/17/21  4:35 PM   Specimen: BLOOD  Result Value Ref Range Status   Specimen Description BLOOD BLOOD RIGHT FOREARM  Final   Special Requests   Final    BOTTLES DRAWN AEROBIC AND ANAEROBIC Blood Culture results may not be optimal due to an excessive volume of blood received in culture bottles   Culture   Final    NO GROWTH 2 DAYS Performed at [facility], [address], [place], [place] [address]    Report Status PENDING  Incomplete  Blood culture (routine x 2)     Status: None (Preliminary result)   Collection Time: 05/17/21  4:40 PM   Specimen: BLOOD  Result Value Ref Range Status   Specimen Description BLOOD RIGHT ANTECUBITAL  Final   Special Requests   Final    BOTTLES DRAWN AEROBIC AND ANAEROBIC Blood Culture results may not be optimal due to an excessive volume of blood received in culture bottles   Culture   Final    NO GROWTH 2 DAYS Performed at [facility], [address] [place], [place] [address]    Report Status PENDING  Incomplete  Resp Panel by RT-PCR (Flu A&B, Covid) Nasopharyngeal Swab     Status: None   Collection Time: 05/17/21  5:07 PM   Specimen: Nasopharyngeal Swab; Nasopharyngeal(NP) swabs in vial transport medium  Result Value Ref Range Status   SARS Coronavirus 2 by RT PCR NEGATIVE NEGATIVE Final    Comment: (NOTE) SARS-CoV-2 target nucleic acids are NOT DETECTED.  The SARS-CoV-2 RNA is generally detectable in upper respiratory specimens during the acute phase of infection. The lowest concentration of SARS-CoV-2 viral copies this assay can detect is 138 copies/mL. A negative result does not preclude SARS-Cov-2 infection and should not be used as the sole basis for treatment or other patient management decisions.  A negative result may occur with  improper specimen collection/handling, submission of specimen other than nasopharyngeal swab, presence of viral mutation(s) within the areas targeted by this assay, and inadequate number of viral copies(<138 copies/mL). A negative result must be combined with clinical observations, patient history, and epidemiological information. The expected result is Negative.  Fact Sheet for Patients:  EntrepreneurPulse.com.au  Fact Sheet for Healthcare Providers:  IncredibleEmployment.be  This test is no t yet approved or cleared by the [place] FDA and  has been authorized for detection and/or diagnosis of SARS-CoV-2  by FDA under an Emergency Use Authorization (EUA). This EUA will remain  in effect (meaning this test can be used) for the duration of the COVID-19 declaration under Section 564(b)(1) of the Act, 21 U.S.C.section 360bbb-3(b)(1), unless the authorization is terminated  or revoked sooner.       Influenza A by PCR NEGATIVE NEGATIVE Final   Influenza B by PCR NEGATIVE NEGATIVE Final    Comment: (NOTE) The Xpert Xpress SARS-CoV-2/FLU/RSV plus assay is intended as an aid in the diagnosis of influenza from Nasopharyngeal swab specimens and should not be used as a sole basis for treatment. Nasal washings and aspirates are unacceptable for Xpert Xpress SARS-CoV-2/FLU/RSV testing.  Fact Sheet for Patients: EntrepreneurPulse.com.au  Fact Sheet for Healthcare Providers: IncredibleEmployment.be  This test is not yet approved or cleared by the [place] FDA and has been authorized for detection and/or diagnosis of SARS-CoV-2 by FDA under an Emergency Use Authorization (EUA). This EUA will remain in effect (meaning this test can be used) for the duration of the COVID-19 declaration under Section 564(b)(1) of the Act, 21 U.S.C. section 360bbb-3(b)(1), unless the authorization  is terminated or revoked.  Performed at [facility], [address] [place], [place] [address]       Radiology Studies: CT ANGIO HEAD NECK W WO CM  Result Date: 05/18/2021 CLINICAL DATA:  Left-sided weakness.2 EXAM: CT ANGIOGRAPHY HEAD AND NECK TECHNIQUE: Multidetector CT imaging of the head and neck was performed using the standard protocol during bolus administration of intravenous contrast. Multiplanar CT image reconstructions and MIPs were obtained to evaluate the vascular anatomy. Carotid stenosis measurements (when applicable) are obtained utilizing NASCET criteria, using the distal internal carotid diameter as the denominator. CONTRAST:  54mL OMNIPAQUE IOHEXOL 350 MG/ML SOLN COMPARISON:  Brain MRI from yesterday FINDINGS: CT HEAD FINDINGS Brain: Very subtle white matter infarct when compared to prior brain MRI. No evidence of progression, hemorrhage, hydrocephalus, or mass. Chronic small vessel ischemia Vascular: See below Skull: Negative Sinuses: Negative Orbits: Negative Review of the MIP images confirms the above findings CTA NECK FINDINGS Aortic arch: Extensive atheromatous plaque with irregular luminal thrombus. Right carotid system: Calcified plaque at the origin without flow limiting stenosis. Partial retropharyngeal course. 60% stenosis at the right ICA origin. No ulceration or dissection. Left carotid system: Atheromatous plaque greatest at the bifurcation with 55% narrowing at the left ICA origin due to calcified plaque. No limiting stenosis or ulceration. Vertebral arteries: Proximal subclavian atherosclerosis without flow reducing stenosis. Calcified plaque at the left vertebral origin, associated stenosis not assessed due to motion artifact. Skeleton: No acute finding Other neck: No acute finding Upper chest: Airway thickening. Review of the MIP images confirms the above findings CTA HEAD FINDINGS Anterior circulation: Atheromatous calcification along the carotid siphons.  No branch occlusion, beading, or aneurysm. Mild atheromatous undulation attributed to atherosclerosis. Posterior circulation: Vertebral and basilar arteries are smoothly contoured and widely patent. Atheromatous calcification to a mild degree along the V4 segments. Moderate atheromatous narrowing at the right P2 segment. Venous sinuses: Unremarkable Anatomic variants: None significant Review of the MIP images confirms the above findings IMPRESSION: 1. No emergent finding. 2. Atherosclerosis most notably causing bilateral proximal ICA stenosis measuring 60% on the right and 55% on the left. 3. Especially advanced atherosclerosis of the aorta with 2 sites of luminal thrombus. Electronically Signed   By: Jorje Guild M.D.   On: 05/18/2021 12:07   CT Angio Chest PE W and/or Wo Contrast  Result Date: 05/17/2021 CLINICAL DATA:  Left-sided weakness, fatigue EXAM: CT ANGIOGRAPHY CHEST WITH CONTRAST TECHNIQUE: Multidetector CT imaging of the chest was performed using the standard protocol during bolus administration of intravenous contrast. Multiplanar CT image reconstructions and MIPs were obtained to evaluate the vascular anatomy. CONTRAST:  42mL OMNIPAQUE IOHEXOL 350 MG/ML SOLN COMPARISON:  02/08/2021 FINDINGS: Cardiovascular: Mild cardiomegaly. Trace pericardial effusion. The RV is nondilated. Satisfactory opacification of pulmonary arteries noted, and there is no evidence of pulmonary emboli. Coronary calcifications. Fair contrast opacification of the thoracic aorta. No dissection or stenosis. Atheromatous nonaneurysmal ascending segment. Classic 3 vessel brachiocephalic arterial origin anatomy from the arch without proximal stenosis. Coarse calcified plaque in the arch and did descend distal descending thoracic aorta. Stable eccentric mural thrombus in the distal arch and proximal and mid descending thoracic aorta. 4 cm fusiform aneurysmal dilatation of the distal descending segment, with eccentric nonocclusive  mural thrombus. Visualized proximal abdominal aorta is mildly atheromatous, normal in caliber. Mediastinum/Nodes: Small hiatal hernia.  No mass or adenopathy. Lungs/Pleura: No pleural effusion. Platelike atelectasis or consolidation in the anterior right upper lobe. Upper Abdomen: Cholecystectomy clips. Small hiatal hernia. No acute findings. Musculoskeletal: Previous cement augmentation of T9 and T10 vertebral bodies. Stable compression deformities of T6 and T7. No acute fracture. Review of the MIP images confirms the above findings. IMPRESSION: 1. Negative for acute PE or thoracic aortic dissection. 2. Extensive eccentric mural thrombus in the descending thoracic aorta, representing potential source of distal emboli. 3. 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 Recommend semi-annual imaging followup by CTA or MRA and referral to cardiothoracic surgery if not already obtained. This recommendation follows 2010 ACCF/AHA/AATS/ACR/ASA/SCA/SCAI/SIR/STS/SVM Guidelines for the Diagnosis and Management of Patients With Thoracic Aortic Disease. Circulation. 2010; 121: e266-e36 4. Platelike atelectasis/consolidation in the in the anterior right upper lobe obscuring the previously noted nodule, presumably representing radiation change. Electronically Signed   By: Lucrezia Europe M.D.   On: 05/17/2021 18:53   MR BRAIN WO CONTRAST  Result Date: 05/17/2021 CLINICAL DATA:  Neuro deficit, acute, stroke suspected EXAM: MRI HEAD WITHOUT CONTRAST TECHNIQUE: Multiplanar, multiecho pulse sequences of the brain and surrounding structures were obtained without intravenous contrast. COMPARISON:  Same day CT head. FINDINGS: Mildly motion limited study. 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 Mild associated edema without mass effect. Moderate additional scattered T2/FLAIR hyperintensities within the white matter, nonspecific but compatible with chronic microvascular ischemic disease.  No evidence of acute hemorrhage, hydrocephalus, mass lesion, midline shift, or extra-axial fluid collection Vascular: Major arterial flow voids are maintained at the skull base. Skull and upper cervical spine: Normal marrow signal. Sinuses/Orbits: Mild paranasal sinus mucosal thickening. Unremarkable orbits. Other: No mastoid effusions. IMPRESSION: 1. 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 Mild associated edema without mass effect. 2. Moderate chronic microvascular ischemic disease. Electronically Signed   By: Margaretha Sheffield M.D.   On: 05/17/2021 17:45   US Venous Img Lower Bilateral (DVT)  Result Date: 05/19/2021 CLINICAL DATA:  Lower extremity pain and edema. EXAM: BILATERAL LOWER EXTREMITY VENOUS DOPPLER ULTRASOUND TECHNIQUE: Gray-scale sonography with graded compression, as well as color Doppler and duplex ultrasound were performed to evaluate the lower extremity deep venous systems from the level of the common femoral vein and including the common femoral, femoral, profunda femoral, popliteal and calf veins including the posterior tibial, peroneal and gastrocnemius veins when visible. The superficial great saphenous vein was also interrogated. Spectral Doppler was utilized to evaluate flow at rest  and with distal augmentation maneuvers in the common femoral, femoral and popliteal veins. COMPARISON:  None. FINDINGS: RIGHT LOWER EXTREMITY Common Femoral Vein: No evidence of thrombus. Normal compressibility, respiratory phasicity and response to augmentation. Saphenofemoral Junction: No evidence of thrombus. Normal compressibility and flow on color Doppler imaging. Profunda Femoral Vein: No evidence of thrombus. Normal compressibility and flow on color Doppler imaging. Femoral Vein: No evidence of thrombus. Normal compressibility, respiratory phasicity and response to augmentation. Popliteal Vein: No evidence of thrombus. Normal compressibility, respiratory phasicity and  response to augmentation. Calf Veins: No evidence of thrombus. Normal compressibility and flow on color Doppler imaging. Superficial Great Saphenous Vein: No evidence of thrombus. Normal compressibility. Venous Reflux:  None. Other Findings: No evidence of superficial thrombophlebitis or abnormal fluid collection. LEFT LOWER EXTREMITY Common Femoral Vein: No evidence of thrombus. Normal compressibility, respiratory phasicity and response to augmentation. Saphenofemoral Junction: No evidence of thrombus. Normal compressibility and flow on color Doppler imaging. Profunda Femoral Vein: No evidence of thrombus. Normal compressibility and flow on color Doppler imaging. Femoral Vein: No evidence of thrombus. Normal compressibility, respiratory phasicity and response to augmentation. Popliteal Vein: No evidence of thrombus. Normal compressibility, respiratory phasicity and response to augmentation. Calf Veins: No evidence of thrombus. Normal compressibility and flow on color Doppler imaging. Superficial Great Saphenous Vein: No evidence of thrombus. Normal compressibility. Venous Reflux:  None. Other Findings: Small, thin fluid collection of the left popliteal fossa measures approximately 1.9 x 0.3 x 1.6 cm and is consistent with a Baker's cyst. No evidence of superficial thrombophlebitis. IMPRESSION: No evidence of deep venous thrombosis in either lower extremity. Small left popliteal fossa Baker's cyst. Electronically Signed   By: Aletta Edouard M.D.   On: 05/19/2021 11:00   ECHOCARDIOGRAM COMPLETE  Result Date: 05/18/2021    ECHOCARDIOGRAM REPORT   Patient Name:   Marissa Fletcher Date of Exam: 05/17/2021 Medical Rec #:  [id number]       Height:       62.0 in Accession #:    [id number]      Weight:       143.0 lb Date of Birth:  03-14-1941       BSA:          1.658 m Patient Age:    12 years        BP:           122/58 mmHg Patient Gender: F               HR:           94 bpm. Exam Location:  [facility] Procedure: 2D  Echo, Cardiac Doppler and Color Doppler Indications:     I63.9 Stroke  History:         Patient has no prior history of Echocardiogram examinations.                  COPD; Risk Factors:Hypertension and Diabetes.  Sonographer:     Cresenciano Lick RDCS Referring Phys:  [id number] Otila Kluver LAI Diagnosing Phys: Kate Sable MD IMPRESSIONS  1. Left ventricular ejection fraction, by estimation, is 35 to 40%. The left ventricle has moderately decreased function. The left ventricle demonstrates global hypokinesis. There is mild left ventricular hypertrophy. Left ventricular diastolic parameters are consistent with Grade II diastolic dysfunction (pseudonormalization).  2. Right ventricular systolic function is normal. The right ventricular size is normal.  3. Left atrial size was mild to moderately dilated.  4. The mitral valve is normal in structure.  Mild mitral valve regurgitation.  5. The aortic valve is tricuspid. Aortic valve regurgitation is not visualized.  6. The inferior vena cava is dilated in size with <50% respiratory variability, suggesting right atrial pressure of 15 mmHg. FINDINGS  Left Ventricle: Left ventricular ejection fraction, by estimation, is 35 to 40%. The left ventricle has moderately decreased function. The left ventricle demonstrates global hypokinesis. The left ventricular internal cavity size was normal in size. There is mild left ventricular hypertrophy. Left ventricular diastolic parameters are consistent with Grade II diastolic dysfunction (pseudonormalization). Right Ventricle: The right ventricular size is normal. No increase in right ventricular wall thickness. Right ventricular systolic function is normal. Left Atrium: Left atrial size was mild to moderately dilated. Right Atrium: Right atrial size was normal in size. Pericardium: There is no evidence of pericardial effusion. Mitral Valve: The mitral valve is normal in structure. Mild mitral valve regurgitation. Tricuspid Valve: The  tricuspid valve is normal in structure. Tricuspid valve regurgitation is not demonstrated. Aortic Valve: The aortic valve is tricuspid. Aortic valve regurgitation is not visualized. Aortic valve peak gradient measures 8.3 mmHg. Pulmonic Valve: The pulmonic valve was normal in structure. Pulmonic valve regurgitation is not visualized. Aorta: The aortic root and ascending aorta are structurally normal, with no evidence of dilitation. Venous: The inferior vena cava is dilated in size with less than 50% respiratory variability, suggesting right atrial pressure of 15 mmHg. IAS/Shunts: No atrial level shunt detected by color flow Doppler.  LEFT VENTRICLE PLAX 2D LVIDd:         5.50 cm Diastology LVIDs:         4.20 cm LV e' medial:    7.51 cm/s LV PW:         1.10 cm LV E/e' medial:  14.4 LV IVS:        1.10 cm LV e' lateral:   7.72 cm/s                        LV E/e' lateral: 14.0  RIGHT VENTRICLE             IVC RV Basal diam:  3.60 cm     IVC diam: 2.10 cm RV S prime:     15.50 cm/s TAPSE (M-mode): 2.3 cm LEFT ATRIUM             Index        RIGHT ATRIUM           Index LA diam:        4.60 cm 2.77 cm/m   RA Area:     11.90 cm LA Vol (A2C):   52.3 ml 31.55 ml/m  RA Volume:   27.70 ml  16.71 ml/m LA Vol (A4C):   71.4 ml 43.07 ml/m LA Biplane Vol: 64.5 ml 38.91 ml/m  AORTIC VALVE AV Vmax:      144.00 cm/s AV Peak Grad: 8.3 mmHg  AORTA Ao Root diam: 3.50 cm Ao Asc diam:  3.30 cm MITRAL VALVE MV Area (PHT): 3.27 cm MV Decel Time: 232 msec MV E velocity: 108.00 cm/s MV A velocity: 99.50 cm/s MV E/A ratio:  1.09 Kate Sable MD Electronically signed by Kate Sable MD Signature Date/Time: 05/18/2021/4:40:37 PM    Final     Scheduled Meds:  aspirin EC  81 mg Oral Daily   atorvastatin  80 mg Oral Daily   carvedilol  3.125 mg Oral BID WC   clopidogrel  75 mg Oral Daily  dapagliflozin propanediol  10 mg Oral Daily   doxepin  50 mg Oral QHS   enoxaparin (LOVENOX) injection  1 mg/kg Subcutaneous BID    ferrous [id number] C-folic acid  1 capsule Oral TID PC   gabapentin  300 mg Oral QHS   nicotine  21 mg Transdermal Daily   PARoxetine  30 mg Oral [id number]   Continuous Infusions:   LOS: 2 days   Time spent: 45 minutes. More than 50% of the time was spent in counseling/coordination of care  Lorella Nimrod, MD [facility]  If 7PM-7AM, please contact night-coverage [email or web address]  05/19/2021, 4:57 PM   This record has been created using Systems analyst. Errors have been sought and corrected,but may not always be located. Such creation errors do not reflect on the standard of care.

## 2021-05-19 NOTE — Progress Notes (Signed)
Chaplain Maggie made initial visit at bedside per request from [place]. Pt was open to visitation and appreciated having space to talk. Storytelling was central to this visit. Chaplain offered compassion and empathy as pt spoke of loss of significant family members. She spoke openly about struggling with anxiety and fears associated with hospitalization. We spoke of images that may help her to gain peace in troubling moments. Her place of calm is the beach. Sharing family photos and talking about her loved ones was helpful.

## 2021-05-19 NOTE — Progress Notes (Signed)
Neurology Progress Note   S:// Seen and examined.  Reports significant improvement in the left upper and lower extremity strength.  No drift noted on exam today.   O:// Current vital signs: BP 140/75 (BP Location: Left Arm)    Pulse 100    Temp 98.5 F (36.9 C)    Resp 16    Ht 5\' 2"  (1.575 m)    Wt 65.7 kg    SpO2 97%    BMI 26.49 kg/m  Vital signs in last 24 hours: Temp:  [97.3 F (36.3 C)-98.6 F (37 C)] 98.5 F (36.9 C) (12/29 0752) Pulse Rate:  [92-105] 100 (12/29 0752) Resp:  [16-20] 16 (12/29 0752) BP: (113-149)/(56-82) 140/75 (12/29 0752) SpO2:  [88 %-98 %] 97 % (12/29 0752)  General: Awake alert mildly short of breath on supplemental oxygen HEENT: Normocephalic/atraumatic, edentulous with poor oral hygiene CVS: Regular rate rhythm Abdomen nondistended nontender Extremities warm well perfused Respiratory: Coarse breath sounds all over with scattered rales and wheezing Neurological exam Awake alert oriented x3 Speech is mildly dysarthric but she says that this is her normal speech without her dentures on. No evidence of aphasia Naming comprehension repetition intact Cranial nerves II to XII intact.  No facial asymmetry noted Motor examination with no drift in the left lower extremity.  No drift in the lower extremities as well.  On individual muscle testing right upper extremity 5/5, left upper extremity 4+/5.  Right lower extremity 5/5.  Left lower extremity 4+/5. Sensation intact light touch without extinction Coordination with mild dysmetria not disproportionate to weakness on the left upper extremity.  Difficulty performing the left lower extremity.  No dysmetria on the right. Gait testing deferred NIH stroke scale: 0   Medications  Current Facility-Administered Medications:    acetaminophen (TYLENOL) tablet 1,000 mg, 1,000 mg, Oral, TID PRN, Enzo Bi, MD, 1,000 mg at 05/18/21 1156   ALPRAZolam Duanne Moron) tablet 0.25 mg, 0.25 mg, Oral, Daily PRN, Enzo Bi, MD,  0.25 mg at 05/18/21 1700   aspirin EC tablet 81 mg, 81 mg, Oral, Daily, Enzo Bi, MD, 81 mg at 05/18/21 0940   atorvastatin (LIPITOR) tablet 40 mg, 40 mg, Oral, Daily, Enzo Bi, MD, 40 mg at 05/18/21 0940   clopidogrel (PLAVIX) tablet 75 mg, 75 mg, Oral, Daily, Enzo Bi, MD, 75 mg at 05/18/21 0939   doxepin (SINEQUAN) capsule 50 mg, 50 mg, Oral, QHS, Enzo Bi, MD, 50 mg at 05/18/21 2316   enoxaparin (LOVENOX) injection 65 mg, 1 mg/kg, Subcutaneous, BID, Lorella Nimrod, MD, 65 mg at 05/18/21 2316   ferrous [id number] C-folic acid (TRINSICON / FOLTRIN) capsule 1 capsule, 1 capsule, Oral, TID PC, Lorella Nimrod, MD, 1 capsule at 05/18/21 1701   gabapentin (NEURONTIN) capsule 300 mg, 300 mg, Oral, QHS, Enzo Bi, MD, 300 mg at 05/18/21 2316   guaiFENesin-dextromethorphan (ROBITUSSIN DM) 100-10 MG/5ML syrup 10 mL, 10 mL, Oral, Q6H PRN, Enzo Bi, MD   ipratropium-albuterol (DUONEB) 0.5-2.5 (3) MG/3ML nebulizer solution 3 mL, 3 mL, Nebulization, Q4H PRN, Lorella Nimrod, MD, 3 mL at 05/18/21 1736   loperamide (IMODIUM) capsule 2 mg, 2 mg, Oral, PRN, Mansy, Jan A, MD, 2 mg at 05/18/21 0017   nicotine (NICODERM CQ - dosed in mg/24 hours) patch 21 mg, 21 mg, Transdermal, Daily, Enzo Bi, MD, 21 mg at 05/18/21 0940   ondansetron (ZOFRAN) injection 4 mg, 4 mg, Intravenous, Q6H PRN, Enzo Bi, MD   ondansetron (ZOFRAN-ODT) disintegrating tablet 4 mg, 4 mg, Oral, Q8H PRN, Enzo Bi,  MD   PARoxetine (PAXIL) tablet 30 mg, 30 mg, Oral, Toya Smothers, MD, 30 mg at 05/19/21 0647 Labs CBC    Component Value Date/Time   WBC 7.5 05/19/2021 0448   RBC 3.87 05/19/2021 0448   HGB 9.3 (L) 05/19/2021 0448   HCT 30.8 (L) 05/19/2021 0448   PLT 327 05/19/2021 0448   MCV 79.6 (L) 05/19/2021 0448   MCH 24.0 (L) 05/19/2021 0448   MCHC 30.2 05/19/2021 0448   RDW 17.0 (H) 05/19/2021 0448   LYMPHSABS 1.0 05/17/2021 0956   MONOABS 0.4 05/17/2021 0956   EOSABS 0.1 05/17/2021 0956   BASOSABS 0.0 05/17/2021  0956    CMP     Component Value Date/Time   NA 136 05/19/2021 0448   K 4.6 05/19/2021 0448   CL 105 05/19/2021 0448   CO2 25 05/19/2021 0448   GLUCOSE 122 (H) 05/19/2021 0448   BUN 7 (L) 05/19/2021 0448   CREATININE 0.58 05/19/2021 0448   CALCIUM 7.6 (L) 05/19/2021 0448   PROT 7.0 05/17/2021 0956   ALBUMIN 3.5 05/17/2021 0956   AST 19 05/17/2021 0956   ALT 12 05/17/2021 0956   ALKPHOS 132 (H) 05/17/2021 0956   BILITOT 0.5 05/17/2021 0956   GFRNONAA >60 05/19/2021 0448    glycosylated hemoglobin-6.2  Lipid Panel     Component Value Date/Time   CHOL 147 05/17/2021 0956   TRIG 65 05/17/2021 0956   HDL 38 (L) 05/17/2021 0956   CHOLHDL 3.9 05/17/2021 0956   VLDL 13 05/17/2021 0956   LDLCALC 96 05/17/2021 0956   2D echocardiogram-LVEF 35 to 40%, LV has moderately decreased function, LV shows global hypokinesis.  Mild LVH.  Grade 2 diastolic dysfunction LV..  RV normal.  LA size mild to moderately dilated.  Mitral valve normal.  Aortic valve is tricuspid and regurgitation not visualized.  No interatrial shunt by color-flow Doppler.  Imaging I have reviewed images in epic and the results pertinent to this consultation are: MRI brain-right posterior limb of internal capsule infarction. CTA head and neck with no emergent large vessel occlusion.  Atherosclerosis-most notably causing bilateral proximal ICA stenosis 60% on the right and 55% on the left.  Especially advanced atherosclerosis of the aorta with 2 sites of luminal thrombus in the aortic arch. 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 MRI brain concerning for acute infarction in the posterior limb of the right internal  capsule. Location typically suggestive of small vessel etiology but given her extensive risk factors including advanced aortic atherosclerosis and multiple areas of aortic thrombi, as well as 55 to 60% bilateral carotid stenosis-could represent a large vessel or cardioembolic etiology. Less likely to have large artery thrombi due to malignancy but the possibility is not neural. Appreciate vascular surgery consultation.  She is on dual antiplatelets. Given the actual thrombi as well as malignancy and cardiomyopathy, I would lean on the side of anticoagulation for stroke prevention but would wait for cardiology to weigh in as well. I would also get lower extremity Dopplers-if there is any evidence of DVT, she would need anticoagulation at that point without question.   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  Recommendations: High intensity statin for goal  LDL less than 70. Continue frequent neurochecks Lower extremity Dopplers Start normalizing blood pressures-hold on discharge 140/90 or below. Stroke prevention-on dual antiplatelets-pending full cardiology consultation, I would be inclined more towards anticoagulation given multiple risk factors-cardiomyopathy, aortic intraluminal thrombus over advanced atherosclerotic disease, malignancy. If the lower extremity Dopplers show a DVT, then anticoagulation decision becomes more apparent. Discussed with primary hospitalist, consulted cardiologist and oncologist via secure chat.  -- Amie Portland, MD Neurologist [facility] Pager: [phone]

## 2021-05-19 NOTE — Progress Notes (Signed)
°   05/19/21 1114  Assess: MEWS Score  Temp 98.2 F (36.8 C)  BP (!) 151/87  Pulse Rate (!) 112  Resp 20  Level of Consciousness Alert  SpO2 93 %  O2 Device Nasal Cannula  O2 Flow Rate (L/min) 2 L/min  Assess: MEWS Score  MEWS Temp 0  MEWS Systolic 0  MEWS Pulse 2  MEWS RR 0  MEWS LOC 0  MEWS Score 2  MEWS Score Color Yellow  Assess: if the MEWS score is Yellow or Red  Were vital signs taken at a resting state? Yes  Focused Assessment Change from prior assessment (see assessment flowsheet)  Does the patient meet 2 or more of the SIRS criteria? No  MEWS guidelines implemented *See Row Information* Yes  Treat  Pain Scale 0-10  Pain Score 0  Neuro symptoms relieved by Anti-anxiety medication  Take Vital Signs  Increase Vital Sign Frequency  Yellow: Q 2hr X 2 then Q 4hr X 2, if remains yellow, continue Q 4hrs  Escalate  MEWS: Escalate Yellow: discuss with charge nurse/RN and consider discussing with provider and RRT  Notify: Charge Nurse/RN  Name of Charge Nurse/RN Notified Orma Flaming, RN  Date Charge Nurse/RN Notified 05/19/21  Time Charge Nurse/RN Notified 1126  Notify: Provider  Provider Name/Title Dr. Reesa Chew  Date Provider Notified 05/19/21  Time Provider Notified 1114  Notification Type  (messaged)  Notification Reason Other (Comment) (yellow mews)  Provider response Other (Comment) (cardiology to see patient and fixed her antianxiety medication times)  Date of Provider Response 05/19/21  Time of Provider Response 1130  Document  Patient Outcome Other (Comment) (pt states that she feels better)  Progress note created (see row info) Yes  Assess: SIRS CRITERIA  SIRS Temperature  0  SIRS Pulse 1  SIRS Respirations  0  SIRS WBC 0  SIRS Score Sum  1

## 2021-05-19 NOTE — Progress Notes (Signed)
OT Cancellation Note  Patient Details Name: Marissa Fletcher MRN: [id number] DOB: 09-Sep-1940   Cancelled Treatment:    Reason Eval/Treat Not Completed: Patient at procedure or test/ unavailable. Upon attempt, pt unavailable for tx, getting ultrasound to check for DVT. Will re-attempt OT tx at later date/time as medically appropriate.   Ardeth Perfect., MPH, MS, OTR/L ascom [phone] 05/19/21, 10:29 AM

## 2021-05-19 NOTE — Consult Note (Signed)
Hematology/Oncology Consult note [facility] [phone] [phone]  Patient Care Team: Juluis Pitch, MD as PCP - General (Family Medicine) End, Harrell Gave, MD as PCP - Cardiology (Cardiology) Telford Nab, RN as Oncology Nurse Navigator Noreene Filbert, MD as Referring Physician (Radiation Oncology) Earlie Server, MD as Consulting Physician (Oncology)   Name of the patient: Marissa Fletcher  [id number]  1941-01-28   Date of visit: 05/19/21 REASON FOR COSULTATION:  Stroke , concerned about hypercoagulability secondary to malignancy History of presenting illness-  80 y.o. female with PMH listed at below who presents to ER for evaluation of left-sided weakness.  Her symptoms improved by the time of arrival.  Patient was found to have acute infarct in the posterior limb of the right internal capsule.  She was also found in hypoxic respiratory failure.  Chest x-ray showed cardiomegaly. CT chest angiogram showed no acute PE.  Extensive [place] mural thrombus in the descending thoracic aorta, representing potential source of distal emboli.  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  Platelike atelectasis/consolidation anterior right upper lobe obscuring the previously noted nodule.  Presumably representing radiation change. Ultrasound bilateral lower extremity showed no evidence of deep vein thrombosis.  Left popliteal fossa Baker's cyst. 05/17/2021, echocardiogram showed cardiomyopathy with LVEF 35 to 40% and grade 2 diastolic dysfunction. Hematology oncology was consulted for concern of possible hypercoagulable state. Patient Sister is at bedside.  She feels very anxious.  + Cough.  No fever or chills. Patient has a history of COPD current everyday smoker.   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  Patient follows up with radiation oncology Dr. Baruch Gouty.  She had CT scan done on 02/08/2021 which showed  anterior inferior right upper lobe with irregular opacity, stable size and there is expected early evaluation of adjacent radiation pneumonitis and fibrosis.   Review of Systems  Constitutional:  Negative for unexpected weight change.  Respiratory:  Positive for cough and shortness of breath.   Cardiovascular:  Negative for chest pain.  Gastrointestinal:  Negative for abdominal distention.  Genitourinary:  Negative for dysuria and frequency.   Skin:  Negative for rash.  Hematological:  Negative for adenopathy.  Psychiatric/Behavioral:  The patient is nervous/anxious.    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 04/2021 Echo: EF 35-40%, glob HK. GrII DD.  Nl RV size/fxn. Mod dil LA. Mild MR.   Carotid arterial disease ([place])    a. 04/2021 CTA Head/neck: RICA 60, LICA 55.   COPD (chronic obstructive pulmonary disease) (HCC)    Depression    Diabetes mellitus without complication ([place])    History of kidney stones    Hypertension    Lung cancer ([place])  Stroke (cerebrum) ([place])    a. 04/2021 MRI brain: Acute infarct post limb of R internal capsule.   Thoracic aortic aneurysm    Tobacco abuse      Past Surgical History:  Procedure Laterality Date   ABDOMINAL HYSTERECTOMY     CHOLECYSTECTOMY     EYE SURGERY Bilateral     KYPHOPLASTY N/A 07/30/2020   Procedure: T10 Kyphoplasty;  Surgeon: Hessie Knows, MD;  Location: [facility] ORS;  Service: Orthopedics;  Laterality: N/A;  T10   KYPHOPLASTY N/A 08/19/2020   Procedure: T9 YPHOPLASTY;  Surgeon: Hessie Knows, MD;  Location: [facility] ORS;  Service: Orthopedics;  Laterality: N/A;    Social History   Socioeconomic History   Marital status: Single    Spouse name: Not on file   Number of children: Not on file   Years of education: Not on file   Highest education level: Not on file  Occupational History   Not on file  Tobacco Use   Smoking status: Every Day    Packs/day: 0.50    Years: 68.00    Pack years: 34.00    Types: Cigarettes   Smokeless tobacco: Never  Vaping Use   Vaping Use: Never used  Substance and Sexual Activity   Alcohol use: Never   Drug use: Never   Sexual activity: Not on file  Other Topics Concern   Not on file  Social History Narrative   Lives locally.  Does not routinely exercise.   Social Determinants of Health   Financial Resource Strain: Not on file  Food Insecurity: Not on file  Transportation Needs: Not on file  Physical Activity: Not on file  Stress: Not on file  Social Connections: Not on file  Intimate Partner Violence: Not on file     Family History  Problem Relation Age of Onset   Dementia Mother    Congestive Heart Failure Mother    Bladder Cancer Father    Heart disease Father    Breast cancer Neg Hx      Current Facility-Administered Medications:    acetaminophen (TYLENOL) tablet 1,000 mg, 1,000 mg, Oral, TID PRN, Enzo Bi, MD, 1,000 mg at 05/18/21 1156   ALPRAZolam (XANAX) tablet 0.25 mg, 0.25 mg, Oral, TID PRN, Lorella Nimrod, MD   aspirin EC tablet 81 mg, 81 mg, Oral, Daily, Enzo Bi, MD, 81 mg at 05/19/21 1101   atorvastatin (LIPITOR) tablet 80 mg, 80 mg, Oral, Daily, Amie Portland, MD, 80 mg at 05/19/21 1501   carvedilol (COREG) tablet 3.125 mg, 3.125 mg, Oral, BID WC, Theora Gianotti, NP    clopidogrel (PLAVIX) tablet 75 mg, 75 mg, Oral, Daily, Enzo Bi, MD, 75 mg at 05/19/21 1101   dapagliflozin propanediol (FARXIGA) tablet 10 mg, 10 mg, Oral, Daily, Theora Gianotti, NP, 10 mg at 05/19/21 1410   doxepin (SINEQUAN) capsule 50 mg, 50 mg, Oral, QHS, Enzo Bi, MD, 50 mg at 05/18/21 2316   enoxaparin (LOVENOX) injection 65 mg, 1 mg/kg, Subcutaneous, BID, Lorella Nimrod, MD, 65 mg at 05/19/21 1105   ferrous [id number] C-folic acid (TRINSICON / FOLTRIN) capsule 1 capsule, 1 capsule, Oral, TID PC, Lorella Nimrod, MD, 1 capsule at 05/19/21 1410   gabapentin (NEURONTIN) capsule 300 mg, 300 mg, Oral, QHS, Enzo Bi, MD, 300 mg at 05/18/21 2316   guaiFENesin-dextromethorphan (ROBITUSSIN DM) 100-10 MG/5ML syrup 10 mL, 10 mL, Oral, Q6H PRN, Enzo Bi, MD   ipratropium-albuterol (DUONEB) 0.5-2.5 (3) MG/3ML nebulizer solution 3 mL, 3 mL, Nebulization, Q4H PRN, Amin,  Sumayya, MD, 3 mL at 05/19/21 1126   loperamide (IMODIUM) capsule 2 mg, 2 mg, Oral, PRN, Mansy, Jan A, MD, 2 mg at 05/18/21 0017   nicotine (NICODERM CQ - dosed in mg/24 hours) patch 21 mg, 21 mg, Transdermal, Daily, Enzo Bi, MD, 21 mg at 05/19/21 1104   ondansetron (ZOFRAN) injection 4 mg, 4 mg, Intravenous, Q6H PRN, Enzo Bi, MD   ondansetron (ZOFRAN-ODT) disintegrating tablet 4 mg, 4 mg, Oral, Q8H PRN, Enzo Bi, MD   PARoxetine (PAXIL) tablet 30 mg, 30 mg, Oral, Toya Smothers, MD, 30 mg at 05/19/21 0647   Physical exam:  Vitals:   05/19/21 0453 05/19/21 0752 05/19/21 1114 05/19/21 1158  BP: 133/76 140/75 (!) 151/87   Pulse: (!) 101 100 (!) 112   Resp: 20 16 20    Temp: 97.8 F (36.6 C) 98.5 F (36.9 C) 98.2 F (36.8 C)   TempSrc: Oral  Oral   SpO2: 92% 97% 93% 93%  Weight:      Height:       Physical Exam Constitutional:      General: She is not in acute distress.    Appearance: She is not diaphoretic.  HENT:     Head: Normocephalic and atraumatic.     Nose: Nose normal.     Mouth/Throat:      Pharynx: No oropharyngeal exudate.  Eyes:     General: No scleral icterus.    Pupils: Pupils are equal, round, and reactive to light.  Cardiovascular:     Rate and Rhythm: Regular rhythm. Tachycardia present.     Heart sounds: No murmur heard. Pulmonary:     Comments:  on nasal cannula oxygen Abdominal:     General: There is no distension.     Palpations: Abdomen is soft.     Tenderness: There is no abdominal tenderness.  Musculoskeletal:        General: Normal range of motion.     Cervical back: Normal range of motion and neck supple.  Skin:    General: Skin is warm and dry.     Findings: No erythema.  Neurological:     Mental Status: She is alert and oriented to person, place, and time.     Cranial Nerves: No cranial nerve deficit.     Motor: No abnormal muscle tone.     Coordination: Coordination normal.  Psychiatric:        Mood and Affect: Affect normal.     Comments: Anxious        CMP Latest Ref Rng & Units 05/19/2021  Glucose 70 - 99 mg/dL 122(H)  BUN 8 - 23 mg/dL 7(L)  Creatinine 0.44 - 1.00 mg/dL 0.58  Sodium 135 - 145 mmol/L 136  Potassium 3.5 - 5.1 mmol/L 4.6  Chloride 98 - 111 mmol/L 105  CO2 22 - 32 mmol/L 25  Calcium 8.9 - 10.3 mg/dL 7.6(L)  Total Protein 6.5 - 8.1 g/dL -  Total Bilirubin 0.3 - 1.2 mg/dL -  Alkaline Phos 38 - 126 U/L -  AST 15 - 41 U/L -  ALT 0 - 44 U/L -   CBC Latest Ref Rng & Units 05/19/2021  WBC 4.0 - 10.5 K/uL 7.5  Hemoglobin 12.0 - 15.0 g/dL 9.3(L)  Hematocrit 36.0 - 46.0 % 30.8(L)  Platelets 150 - 400 K/uL 327    RADIOGRAPHIC STUDIES: I have personally reviewed the radiological images as listed and agreed with the findings in the report. CT ANGIO HEAD NECK W WO CM  Result Date: 05/18/2021 CLINICAL DATA:  Left-sided weakness.2 EXAM: CT ANGIOGRAPHY HEAD AND NECK TECHNIQUE: Multidetector CT imaging of the head and neck was performed using the standard protocol during bolus administration of intravenous contrast.  Multiplanar CT image reconstructions and MIPs were obtained to evaluate the vascular anatomy. Carotid stenosis measurements (when applicable) are obtained utilizing NASCET criteria, using the distal internal carotid diameter as the denominator. CONTRAST:  71mL OMNIPAQUE IOHEXOL 350 MG/ML SOLN COMPARISON:  Brain MRI from yesterday FINDINGS: CT HEAD FINDINGS Brain: Very subtle white matter infarct when compared to prior brain MRI. No evidence of progression, hemorrhage, hydrocephalus, or mass. Chronic small vessel ischemia Vascular: See below Skull: Negative Sinuses: Negative Orbits: Negative Review of the MIP images confirms the above findings CTA NECK FINDINGS Aortic arch: Extensive atheromatous plaque with irregular luminal thrombus. Right carotid system: Calcified plaque at the origin without flow limiting stenosis. Partial retropharyngeal course. 60% stenosis at the right ICA origin. No ulceration or dissection. Left carotid system: Atheromatous plaque greatest at the bifurcation with 55% narrowing at the left ICA origin due to calcified plaque. No limiting stenosis or ulceration. Vertebral arteries: Proximal subclavian atherosclerosis without flow reducing stenosis. Calcified plaque at the left vertebral origin, associated stenosis not assessed due to motion artifact. Skeleton: No acute finding Other neck: No acute finding Upper chest: Airway thickening. Review of the MIP images confirms the above findings CTA HEAD FINDINGS Anterior circulation: Atheromatous calcification along the carotid siphons. No branch occlusion, beading, or aneurysm. Mild atheromatous undulation attributed to atherosclerosis. Posterior circulation: Vertebral and basilar arteries are smoothly contoured and widely patent. Atheromatous calcification to a mild degree along the V4 segments. Moderate atheromatous narrowing at the right P2 segment. Venous sinuses: Unremarkable Anatomic variants: None significant Review of the MIP images confirms  the above findings IMPRESSION: 1. No emergent finding. 2. Atherosclerosis most notably causing bilateral proximal ICA stenosis measuring 60% on the right and 55% on the left. 3. Especially advanced atherosclerosis of the aorta with 2 sites of luminal thrombus. Electronically Signed   By: Jorje Guild M.D.   On: 05/18/2021 12:07   DG Chest 2 View  Result Date: 05/17/2021 CLINICAL DATA:  Shortness of breath EXAM: CHEST - 2 VIEW COMPARISON:  Radiographs done on 08/11/2020 and CT done on 02/08/2021 FINDINGS: Transverse diameter of heart is increased. There are no signs of alveolar pulmonary edema. There is transverse linear infiltrate in the right parahilar region. Rest of the lung fields are essentially clear. There is no significant pleural effusion or pneumothorax. There is previous vertebroplasty in the thoracic spine. IMPRESSION: Cardiomegaly. There is transverse linear infiltrate in the right parahilar region suggesting atelectasis/pneumonia. Electronically Signed   By: Elmer Picker M.D.   On: 05/17/2021 10:30   CT HEAD WO CONTRAST  Result Date: 05/17/2021 CLINICAL DATA:  Left-sided weakness EXAM: CT HEAD WITHOUT CONTRAST TECHNIQUE: Contiguous axial images were obtained from the base of the skull through the vertex without intravenous contrast. COMPARISON:  None. FINDINGS: Brain: There are no signs of bleeding within the cranium. Ventricles are not dilated. Cortical sulci are prominent. There is decreased density in the periventricular and subcortical white matter. Calcifications are seen in the basal ganglia. Vascular: There are scattered arterial calcifications. Skull: Unremarkable. Sinuses/Orbits: There is mucosal thickening in the frontal and ethmoid sinuses. 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 Other: None IMPRESSION: No acute intracranial findings are seen in noncontrast CT brain. Atrophy. Small-vessel  disease. Chronic sinusitis. Electronically Signed   By: Elmer Picker M.D.   On: 05/17/2021 10:55   CT Angio Chest PE W and/or Wo Contrast  Result Date: 05/17/2021 CLINICAL DATA:  Left-sided weakness, fatigue EXAM: CT ANGIOGRAPHY CHEST WITH CONTRAST TECHNIQUE: Multidetector CT imaging of the chest was performed using the standard protocol during bolus administration of intravenous contrast. Multiplanar CT image reconstructions and MIPs were obtained to evaluate the vascular anatomy. CONTRAST:  58mL OMNIPAQUE IOHEXOL 350 MG/ML SOLN COMPARISON:  02/08/2021 FINDINGS: Cardiovascular: Mild cardiomegaly. Trace pericardial effusion. The RV is nondilated. Satisfactory opacification of pulmonary arteries noted, and there is no evidence of pulmonary emboli. Coronary calcifications. Fair contrast opacification of the thoracic aorta. No dissection or stenosis. Atheromatous nonaneurysmal ascending segment. Classic 3 vessel brachiocephalic arterial origin anatomy from the arch without proximal stenosis. Coarse calcified plaque in the arch and did descend distal descending thoracic aorta. Stable eccentric mural thrombus in the distal arch and proximal and mid descending thoracic aorta. 4 cm fusiform aneurysmal dilatation of the distal descending segment, with eccentric nonocclusive mural thrombus. Visualized proximal abdominal aorta is mildly atheromatous, normal in caliber. Mediastinum/Nodes: Small hiatal hernia.  No mass or adenopathy. Lungs/Pleura: No pleural effusion. Platelike atelectasis or consolidation in the anterior right upper lobe. Upper Abdomen: Cholecystectomy clips. Small hiatal hernia. No acute findings. Musculoskeletal: Previous cement augmentation of T9 and T10 vertebral bodies. Stable compression deformities of T6 and T7. No acute fracture. Review of the MIP images confirms the above findings. IMPRESSION: 1. Negative for acute PE or thoracic aortic dissection. 2. Extensive eccentric mural thrombus in  the descending thoracic aorta, representing potential source of distal emboli. 3. 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 Recommend semi-annual imaging followup by CTA or MRA and referral to cardiothoracic surgery if not already obtained. This recommendation follows 2010 ACCF/AHA/AATS/ACR/ASA/SCA/SCAI/SIR/STS/SVM Guidelines for the Diagnosis and Management of Patients With Thoracic Aortic Disease. Circulation. 2010; 121: e266-e36 4. Platelike atelectasis/consolidation in the in the anterior right upper lobe obscuring the previously noted nodule, presumably representing radiation change. Electronically Signed   By: Lucrezia Europe M.D.   On: 05/17/2021 18:53   MR BRAIN WO CONTRAST  Result Date: 05/17/2021 CLINICAL DATA:  Neuro deficit, acute, stroke suspected EXAM: MRI HEAD WITHOUT CONTRAST TECHNIQUE: Multiplanar, multiecho pulse sequences of the brain and surrounding structures were obtained without intravenous contrast. COMPARISON:  Same day CT head. FINDINGS: Mildly motion limited study. 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 Mild associated edema without mass effect. Moderate additional scattered T2/FLAIR hyperintensities within the white matter, nonspecific but compatible with chronic microvascular ischemic disease. No evidence of acute hemorrhage, hydrocephalus, mass lesion, midline shift, or extra-axial fluid collection Vascular: Major arterial flow voids are maintained at the skull base. Skull and upper cervical spine: Normal marrow signal. Sinuses/Orbits: Mild paranasal sinus mucosal thickening. Unremarkable orbits. Other: No mastoid effusions. IMPRESSION: 1. 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 Mild associated edema without mass effect. 2. Moderate chronic microvascular ischemic disease. Electronically Signed   By: Margaretha Sheffield M.D.   On: 05/17/2021 17:45   US Venous Img Lower Bilateral (DVT)  Result Date:  05/19/2021 CLINICAL DATA:  Lower extremity pain and edema. EXAM: BILATERAL LOWER EXTREMITY VENOUS DOPPLER ULTRASOUND TECHNIQUE: Gray-scale sonography with graded compression, as well as color Doppler and duplex ultrasound were performed to evaluate the lower extremity deep venous systems from the level of the common femoral vein and including the common femoral,  femoral, profunda femoral, popliteal and calf veins including the posterior tibial, peroneal and gastrocnemius veins when visible. The superficial great saphenous vein was also interrogated. Spectral Doppler was utilized to evaluate flow at rest and with distal augmentation maneuvers in the common femoral, femoral and popliteal veins. COMPARISON:  None. FINDINGS: RIGHT LOWER EXTREMITY Common Femoral Vein: No evidence of thrombus. Normal compressibility, respiratory phasicity and response to augmentation. Saphenofemoral Junction: No evidence of thrombus. Normal compressibility and flow on color Doppler imaging. Profunda Femoral Vein: No evidence of thrombus. Normal compressibility and flow on color Doppler imaging. Femoral Vein: No evidence of thrombus. Normal compressibility, respiratory phasicity and response to augmentation. Popliteal Vein: No evidence of thrombus. Normal compressibility, respiratory phasicity and response to augmentation. Calf Veins: No evidence of thrombus. Normal compressibility and flow on color Doppler imaging. Superficial Great Saphenous Vein: No evidence of thrombus. Normal compressibility. Venous Reflux:  None. Other Findings: No evidence of superficial thrombophlebitis or abnormal fluid collection. LEFT LOWER EXTREMITY Common Femoral Vein: No evidence of thrombus. Normal compressibility, respiratory phasicity and response to augmentation. Saphenofemoral Junction: No evidence of thrombus. Normal compressibility and flow on color Doppler imaging. Profunda Femoral Vein: No evidence of thrombus. Normal compressibility and flow on  color Doppler imaging. Femoral Vein: No evidence of thrombus. Normal compressibility, respiratory phasicity and response to augmentation. Popliteal Vein: No evidence of thrombus. Normal compressibility, respiratory phasicity and response to augmentation. Calf Veins: No evidence of thrombus. Normal compressibility and flow on color Doppler imaging. Superficial Great Saphenous Vein: No evidence of thrombus. Normal compressibility. Venous Reflux:  None. Other Findings: Small, thin fluid collection of the left popliteal fossa measures approximately 1.9 x 0.3 x 1.6 cm and is consistent with a Baker's cyst. No evidence of superficial thrombophlebitis. IMPRESSION: No evidence of deep venous thrombosis in either lower extremity. Small left popliteal fossa Baker's cyst. Electronically Signed   By: Aletta Edouard M.D.   On: 05/19/2021 11:00   ECHOCARDIOGRAM COMPLETE  Result Date: 05/18/2021    ECHOCARDIOGRAM REPORT   Patient Name:   RAELEY GILMORE Date of Exam: 05/17/2021 Medical Rec #:  [id number]       Height:       62.0 in Accession #:    [id number]      Weight:       143.0 lb Date of Birth:  04-12-1941       BSA:          1.658 m Patient Age:    53 years        BP:           122/58 mmHg Patient Gender: F               HR:           94 bpm. Exam Location:  [facility] Procedure: 2D Echo, Cardiac Doppler and Color Doppler Indications:     I63.9 Stroke  History:         Patient has no prior history of Echocardiogram examinations.                  COPD; Risk Factors:Hypertension and Diabetes.  Sonographer:     Cresenciano Lick RDCS Referring Phys:  [id number] Otila Kluver LAI Diagnosing Phys: Kate Sable MD IMPRESSIONS  1. Left ventricular ejection fraction, by estimation, is 35 to 40%. The left ventricle has moderately decreased function. The left ventricle demonstrates global hypokinesis. There is mild left ventricular hypertrophy. Left ventricular diastolic parameters are consistent with Grade II diastolic dysfunction  (  pseudonormalization).  2. Right ventricular systolic function is normal. The right ventricular size is normal.  3. Left atrial size was mild to moderately dilated.  4. The mitral valve is normal in structure. Mild mitral valve regurgitation.  5. The aortic valve is tricuspid. Aortic valve regurgitation is not visualized.  6. The inferior vena cava is dilated in size with <50% respiratory variability, suggesting right atrial pressure of 15 mmHg. FINDINGS  Left Ventricle: Left ventricular ejection fraction, by estimation, is 35 to 40%. The left ventricle has moderately decreased function. The left ventricle demonstrates global hypokinesis. The left ventricular internal cavity size was normal in size. There is mild left ventricular hypertrophy. Left ventricular diastolic parameters are consistent with Grade II diastolic dysfunction (pseudonormalization). Right Ventricle: The right ventricular size is normal. No increase in right ventricular wall thickness. Right ventricular systolic function is normal. Left Atrium: Left atrial size was mild to moderately dilated. Right Atrium: Right atrial size was normal in size. Pericardium: There is no evidence of pericardial effusion. Mitral Valve: The mitral valve is normal in structure. Mild mitral valve regurgitation. Tricuspid Valve: The tricuspid valve is normal in structure. Tricuspid valve regurgitation is not demonstrated. Aortic Valve: The aortic valve is tricuspid. Aortic valve regurgitation is not visualized. Aortic valve peak gradient measures 8.3 mmHg. Pulmonic Valve: The pulmonic valve was normal in structure. Pulmonic valve regurgitation is not visualized. Aorta: The aortic root and ascending aorta are structurally normal, with no evidence of dilitation. Venous: The inferior vena cava is dilated in size with less than 50% respiratory variability, suggesting right atrial pressure of 15 mmHg. IAS/Shunts: No atrial level shunt detected by color flow Doppler.  LEFT  VENTRICLE PLAX 2D LVIDd:         5.50 cm Diastology LVIDs:         4.20 cm LV e' medial:    7.51 cm/s LV PW:         1.10 cm LV E/e' medial:  14.4 LV IVS:        1.10 cm LV e' lateral:   7.72 cm/s                        LV E/e' lateral: 14.0  RIGHT VENTRICLE             IVC RV Basal diam:  3.60 cm     IVC diam: 2.10 cm RV S prime:     15.50 cm/s TAPSE (M-mode): 2.3 cm LEFT ATRIUM             Index        RIGHT ATRIUM           Index LA diam:        4.60 cm 2.77 cm/m   RA Area:     11.90 cm LA Vol (A2C):   52.3 ml 31.55 ml/m  RA Volume:   27.70 ml  16.71 ml/m LA Vol (A4C):   71.4 ml 43.07 ml/m LA Biplane Vol: 64.5 ml 38.91 ml/m  AORTIC VALVE AV Vmax:      144.00 cm/s AV Peak Grad: 8.3 mmHg  AORTA Ao Root diam: 3.50 cm Ao Asc diam:  3.30 cm MITRAL VALVE MV Area (PHT): 3.27 cm MV Decel Time: 232 msec MV E velocity: 108.00 cm/s MV A velocity: 99.50 cm/s MV E/A ratio:  1.09 Kate Sable MD Electronically signed by Kate Sable MD Signature Date/Time: 05/18/2021/4:40:37 PM    Final     Assessment and  plan- Patient is a 80 y.o. female currently admitted for stroke, newly discovered cardiomyopathy #Acute ischemic stroke. #Newly diagnosed cardiomyopathy Patient has multiple risk factors including smoking, cardiomyopathy, atherosclerosis related thrombus.  I will defer to neurology and cardiology for management including recommendation of anticoagulation.   #History of stage Ia adenocarcinoma of right lung, status post SBRT. Current CT scan showed no obvious evidence of cancer recurrence.  I think her stroke is less likely secondary to malignancy.    Thank you for allowing me to participate in the care of this patient.     Earlie Server, MD, PhD 05/19/2021

## 2021-05-20 ENCOUNTER — Other Ambulatory Visit: Payer: Self-pay | Admitting: *Deleted

## 2021-05-20 ENCOUNTER — Encounter: Payer: Self-pay | Admitting: Internal Medicine

## 2021-05-20 ENCOUNTER — Inpatient Hospital Stay
Admit: 2021-05-20 | Discharge: 2021-05-20 | Disposition: A | Discharge status: Home / Self Care | Payer: Medicare Other | Attending: Nurse Practitioner | Admitting: Nurse Practitioner

## 2021-05-20 ENCOUNTER — Inpatient Hospital Stay: Payer: Medicare Other | Admitting: *Deleted

## 2021-05-20 ENCOUNTER — Inpatient Hospital Stay (HOSPITAL_COMMUNITY)
Admit: 2021-05-20 | Discharge: 2021-05-20 | Disposition: A | Discharge status: Home / Self Care | Payer: Medicare Other | Attending: Nurse Practitioner | Admitting: Nurse Practitioner

## 2021-05-20 DIAGNOSIS — I639 Cerebral infarction, unspecified: Secondary | ICD-10-CM

## 2021-05-20 DIAGNOSIS — I429 Cardiomyopathy, unspecified: Secondary | ICD-10-CM | POA: Diagnosis not present

## 2021-05-20 DIAGNOSIS — I6389 Other cerebral infarction: Secondary | ICD-10-CM | POA: Diagnosis not present

## 2021-05-20 LAB — CBC
HCT: 28.3 % — ABNORMAL LOW (ref 36.0–46.0)
Hemoglobin: 8.5 g/dL — ABNORMAL LOW (ref 12.0–15.0)
MCH: 24 pg — ABNORMAL LOW (ref 26.0–34.0)
MCHC: 30 g/dL (ref 30.0–36.0)
MCV: 79.9 fL — ABNORMAL LOW (ref 80.0–100.0)
Platelets: 323 10*3/uL (ref 150–400)
RBC: 3.54 MIL/uL — ABNORMAL LOW (ref 3.87–5.11)
RDW: 17.2 % — ABNORMAL HIGH (ref 11.5–15.5)
WBC: 6.7 10*3/uL (ref 4.0–10.5)
nRBC: 0 % (ref 0.0–0.2)

## 2021-05-20 LAB — BASIC METABOLIC PANEL
Anion gap: 5 (ref 5–15)
BUN: 7 mg/dL — ABNORMAL LOW (ref 8–23)
CO2: 26 mmol/L (ref 22–32)
Calcium: 7.9 mg/dL — ABNORMAL LOW (ref 8.9–10.3)
Chloride: 104 mmol/L (ref 98–111)
Creatinine, Ser: 0.54 mg/dL (ref 0.44–1.00)
GFR, Estimated: >60 mL/min (ref >60)
Glucose, Bld: 117 mg/dL — ABNORMAL HIGH (ref 70–99)
Potassium: 4 mmol/L (ref 3.5–5.1)
Sodium: 135 mmol/L (ref 135–145)

## 2021-05-20 LAB — MAGNESIUM: Magnesium: 2.1 mg/dL (ref 1.7–2.4)

## 2021-05-20 MED ORDER — ALBUTEROL SULFATE (2.5 MG/3ML) 0.083% IN NEBU: 2.5000 mg | INHALATION_SOLUTION | Freq: Four times a day (QID) | RESPIRATORY_TRACT | Status: DC

## 2021-05-20 MED ORDER — LOSARTAN POTASSIUM 25 MG PO TABS
12.5000 mg | ORAL_TABLET | Freq: Every day | ORAL | Status: DC
  Administered 2021-05-20: 14:00:00 12.5 mg via ORAL
  Filled 2021-05-20: qty 1

## 2021-05-20 MED ORDER — LOSARTAN POTASSIUM 25 MG PO TABS: 12.5000 mg | ORAL_TABLET | Freq: Every day | ORAL | 3 refills | Status: DC

## 2021-05-20 MED ORDER — ASPIRIN 81 MG PO TBEC: 81.0000 mg | DELAYED_RELEASE_TABLET | Freq: Every day | ORAL | 11 refills | Status: DC

## 2021-05-20 MED ORDER — CLOPIDOGREL BISULFATE 75 MG PO TABS: 75.0000 mg | ORAL_TABLET | Freq: Every day | ORAL | 12 refills | Status: DC

## 2021-05-20 MED ORDER — ACETAMINOPHEN 500 MG PO TABS
1000.0000 mg | ORAL_TABLET | Freq: Three times a day (TID) | ORAL | 0 refills | Status: DC | PRN
Start: 2021-05-20 — End: 2023-05-15

## 2021-05-20 MED ORDER — DAPAGLIFLOZIN PROPANEDIOL 10 MG PO TABS: 10.0000 mg | ORAL_TABLET | Freq: Every day | ORAL | 2 refills | Status: DC

## 2021-05-20 MED ORDER — FE FUMARATE-B12-VIT C-FA-IFC PO CAPS: 1.0000 | ORAL_CAPSULE | Freq: Three times a day (TID) | ORAL | 3 refills | Status: DC

## 2021-05-20 MED ORDER — CARVEDILOL 3.125 MG PO TABS: 3.1250 mg | ORAL_TABLET | Freq: Two times a day (BID) | ORAL | 3 refills | Status: DC

## 2021-05-20 NOTE — Progress Notes (Addendum)
Neurology Progress Note   S:// Seen and examined this morning. No weakness other than left shoulder pain which is baseline for her. Has been evaluated by vascular surgery, cardiology and oncology-appreciate consultations.   O:// Current vital signs: BP (!) 154/76 (BP Location: Left Arm)    Pulse 95    Temp 97.6 F (36.4 C) (Oral)    Resp 20    Ht 5\' 2"  (1.575 m)    Wt 65.7 kg    SpO2 98%    BMI 26.49 kg/m  Vital signs in last 24 hours: Temp:  [97.6 F (36.4 C)-99.2 F (37.3 C)] 97.6 F (36.4 C) (12/30 0916) Pulse Rate:  [76-112] 95 (12/30 0916) Resp:  [16-20] 20 (12/30 0916) BP: (114-154)/(62-87) 154/76 (12/30 0916) SpO2:  [93 %-99 %] 98 % (12/30 0916)  General: Awake alert mildly short of breath on supplemental oxygen HEENT: Normocephalic/atraumatic, edentulous with poor oral hygiene CVS: Regular rate rhythm Abdomen nondistended nontender Extremities warm well perfused Respiratory: Coarse breath sounds all over with scattered rales and wheezing Neurological exam Awake alert oriented x3 Speech is mildly dysarthric but she says that this is her normal speech without her dentures on. No evidence of aphasia Naming comprehension repetition intact Cranial nerves II to XII intact.  No facial asymmetry noted Motor examination with no drift in the left lower extremity.  No drift in the lower extremities as well.  On individual muscle testing right upper extremity 5/5, left upper extremity 4+/5.  Right lower extremity 5/5.  Left lower extremity 4+/5. Sensation intact light touch without extinction Coordination with mild dysmetria not disproportionate to weakness on the left upper extremity.  Difficulty performing the left lower extremity.  No dysmetria on the right. Gait testing deferred NIH stroke scale: 0  Unchanged exam from yesterday  Medications  Current Facility-Administered Medications:    acetaminophen (TYLENOL) tablet 1,000 mg, 1,000 mg, Oral, TID PRN, Enzo Bi, MD,  1,000 mg at 05/20/21 0913   albuterol (VENTOLIN HFA) 108 (90 Base) MCG/ACT inhaler 1-2 puff, 1-2 puff, Inhalation, Q6H, Samtani, Jai-Gurmukh, MD   ALPRAZolam Duanne Moron) tablet 0.25 mg, 0.25 mg, Oral, TID PRN, Lorella Nimrod, MD, 0.25 mg at 05/20/21 7628   aspirin EC tablet 81 mg, 81 mg, Oral, Daily, Enzo Bi, MD, 81 mg at 05/20/21 0915   atorvastatin (LIPITOR) tablet 80 mg, 80 mg, Oral, Daily, Amie Portland, MD, 80 mg at 05/20/21 0912   carvedilol (COREG) tablet 3.125 mg, 3.125 mg, Oral, BID WC, Theora Gianotti, NP, 3.125 mg at 05/20/21 3151   clopidogrel (PLAVIX) tablet 75 mg, 75 mg, Oral, Daily, Enzo Bi, MD, 75 mg at 05/20/21 0912   dapagliflozin propanediol (FARXIGA) tablet 10 mg, 10 mg, Oral, Daily, Theora Gianotti, NP, 10 mg at 05/20/21 0912   doxepin (SINEQUAN) capsule 50 mg, 50 mg, Oral, QHS, Enzo Bi, MD, 50 mg at 05/19/21 2009   ferrous [id number] C-folic acid (TRINSICON / FOLTRIN) capsule 1 capsule, 1 capsule, Oral, TID PC, Lorella Nimrod, MD, 1 capsule at 05/20/21 0912   gabapentin (NEURONTIN) capsule 300 mg, 300 mg, Oral, QHS, Enzo Bi, MD, 300 mg at 05/19/21 2009   guaiFENesin-dextromethorphan (ROBITUSSIN DM) 100-10 MG/5ML syrup 10 mL, 10 mL, Oral, Q6H PRN, Enzo Bi, MD   ipratropium-albuterol (DUONEB) 0.5-2.5 (3) MG/3ML nebulizer solution 3 mL, 3 mL, Nebulization, Q4H PRN, Lorella Nimrod, MD, 3 mL at 05/19/21 1126   loperamide (IMODIUM) capsule 2 mg, 2 mg, Oral, PRN, Mansy, Jan A, MD, 2 mg at 05/18/21 [phone]  nicotine (NICODERM CQ - dosed in mg/24 hours) patch 21 mg, 21 mg, Transdermal, Daily, Enzo Bi, MD, 21 mg at 05/20/21 0915   ondansetron (ZOFRAN) injection 4 mg, 4 mg, Intravenous, Q6H PRN, Enzo Bi, MD   ondansetron (ZOFRAN-ODT) disintegrating tablet 4 mg, 4 mg, Oral, Q8H PRN, Enzo Bi, MD   PARoxetine (PAXIL) tablet 30 mg, 30 mg, Oral, Toya Smothers, MD, 30 mg at 05/20/21 0913 Labs CBC    Component Value Date/Time   WBC 6.7 05/20/2021  0500   RBC 3.54 (L) 05/20/2021 0500   HGB 8.5 (L) 05/20/2021 0500   HCT 28.3 (L) 05/20/2021 0500   PLT 323 05/20/2021 0500   MCV 79.9 (L) 05/20/2021 0500   MCH 24.0 (L) 05/20/2021 0500   MCHC 30.0 05/20/2021 0500   RDW 17.2 (H) 05/20/2021 0500   LYMPHSABS 1.0 05/17/2021 0956   MONOABS 0.4 05/17/2021 0956   EOSABS 0.1 05/17/2021 0956   BASOSABS 0.0 05/17/2021 0956    CMP     Component Value Date/Time   NA 135 05/20/2021 0500   K 4.0 05/20/2021 0500   CL 104 05/20/2021 0500   CO2 26 05/20/2021 0500   GLUCOSE 117 (H) 05/20/2021 0500   BUN 7 (L) 05/20/2021 0500   CREATININE 0.54 05/20/2021 0500   CALCIUM 7.9 (L) 05/20/2021 0500   PROT 7.0 05/17/2021 0956   ALBUMIN 3.5 05/17/2021 0956   AST 19 05/17/2021 0956   ALT 12 05/17/2021 0956   ALKPHOS 132 (H) 05/17/2021 0956   BILITOT 0.5 05/17/2021 0956   GFRNONAA >60 05/20/2021 0500    glycosylated hemoglobin-6.2  Lipid Panel     Component Value Date/Time   CHOL 147 05/17/2021 0956   TRIG 65 05/17/2021 0956   HDL 38 (L) 05/17/2021 0956   CHOLHDL 3.9 05/17/2021 0956   VLDL 13 05/17/2021 0956   LDLCALC 96 05/17/2021 0956   2D echocardiogram-LVEF 35 to 40%, LV has moderately decreased function, LV shows global hypokinesis.  Mild LVH.  Grade 2 diastolic dysfunction LV..  RV normal.  LA size mild to moderately dilated.  Mitral valve normal.  Aortic valve is tricuspid and regurgitation not visualized.  No interatrial shunt by color-flow Doppler.  Imaging I have reviewed images in epic and the results pertinent to this consultation are: MRI brain-right posterior limb of internal capsule infarction. CTA head and neck with no emergent large vessel occlusion.  Atherosclerosis-most notably causing bilateral proximal ICA stenosis 60% on the right and 55% on the left.  Especially advanced atherosclerosis of the aorta with 2 sites of luminal thrombus in the aortic arch. CT angio chest PE protocol negative for PE but extensive eccentric  mural thrombus in the descending thoracic aorta.  Lower extremity Doppler-no DVT  Assessment:  80 year old with past history of tobacco abuse, COPD, stage I adenocarcinoma of the right upper lobe status post SBRT, diabetes and hypertension presented for evaluation of left-sided nearly flaccid paralysis which she rapidly improved to mild paresis and now has minimal weakness residual. MRI concerning for acute infarction in the posterior limb of the right internal capsule.  Location is more suggestive of a lacunar infarct due to small vessel etiology but the CT angiogram of the chest as well as head and neck revealed extensive advanced atherosclerosis and multiple aortic thrombi as well as 55 to 60% bilateral carotid stenosis. In consultation with oncology and cardiology, I did discuss the possibility of anticoagulation given above risk factors as well as hypercoagulability but her atherosclerotic risk factors  are more likely to be the cause of the stroke as well as her vessel imaging finding and maximal antiplatelet and statin management would be preferred over anticoagulation. 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  Recommendations: High intensity statin for goal LDL less than 70. Continue frequent neurochecks Dual antiplatelets as well as high intensity statin for 3 months followed by single antiplatelet and statin. Outpatient follow-up with vascular surgery, cardiology, oncology. Outpatient neurology follow-up in 4 to 6 weeks.  Consider long-term cardiac monitoring if deemed appropriate although stroke appearance is more consistent with small vessel etiology and not cardioembolic source hence not pursuing it now. Blood pressure goal at  discharge-normotension. Plan was discussed with the patient and Sister Diane on the phone at bedside. Plan was relayed to Dr. Verlon Au in person. Please call with questions.   -- Amie Portland, MD Neurologist [facility] Pager: [phone]

## 2021-05-20 NOTE — Care Management Important Message (Signed)
Important Message  Patient Details  Name: KIARAH ECKSTEIN MRN: [id number] Date of Birth: 10-19-1940   Medicare Important Message Given:  Yes     Juliann Pulse A Jacquelyne Quarry 05/20/2021, 10:50 AM

## 2021-05-20 NOTE — TOC Progression Note (Signed)
Transition of Care [facility]) - Progression Note    Patient Details  Name: HILLARIE HARRIGAN MRN: [id number] Date of Birth: November 11, 1940  Transition of Care [facility]) CM/SW [place], RN Phone Number: 05/20/2021, 1:38 PM  Clinical Narrative:   Patient requires home oxygen, ordered through [employer], delivered to room.  No further needs.    Expected Discharge Plan: [place] Barriers to Discharge: Continued Medical Work up  Expected Discharge Plan and Services Expected Discharge Plan: [place]   Discharge Planning Services: CM Consult Post Acute Care Choice: [place] arrangements for the past 2 months: Single Family Home Expected Discharge Date: 05/20/21                                     Social Determinants of Health (SDOH) Interventions    Readmission Risk Interventions No flowsheet data found.

## 2021-05-20 NOTE — Progress Notes (Signed)
Patient discharged home. IV removed. Went over discharge instructions and medications as well as oxygen with patient and patients sister. Both agreed that they understood and all questions were answered. Patient going home POV with sister.

## 2021-05-20 NOTE — Progress Notes (Signed)
PT Oxygen Note  Patient Details Name: Marissa Fletcher MRN: [id number] DOB: Apr 07, 1941   SaO2 on room air at rest = 87-89% SaO2 on room air while ambulating = 89-90% SaO2 on 2 liters of O2 while ambulating = 90-92%    [name]. Fairly IV, PT, DPT Physical Therapist- [facility]  05/20/2021, 11:15 AM

## 2021-05-20 NOTE — Progress Notes (Signed)
Progress Note  Patient Name: Marissa Fletcher Date of Encounter: 05/20/2021  Primary Cardiologist: Nelva Bush, MD  Subjective   Feels well this AM.  Strength improving, but not at baseline.  Looking forward to D/C home w/ HHPT.  Some lightheadedness w/ position changes when working w/ PT this AM.  Inpatient Medications    Scheduled Meds:  albuterol  2.5 mg Inhalation Q6H   aspirin EC  81 mg Oral Daily   atorvastatin  80 mg Oral Daily   carvedilol  3.125 mg Oral BID WC   clopidogrel  75 mg Oral Daily   dapagliflozin propanediol  10 mg Oral Daily   doxepin  50 mg Oral QHS   ferrous [id number] C-folic acid  1 capsule Oral TID PC   gabapentin  300 mg Oral QHS   losartan  12.5 mg Oral Daily   nicotine  21 mg Transdermal Daily   PARoxetine  30 mg Oral BH-q7a   Continuous Infusions:  PRN Meds: acetaminophen, ALPRAZolam, guaiFENesin-dextromethorphan, ipratropium-albuterol, loperamide, ondansetron (ZOFRAN) IV, ondansetron   Vital Signs    Vitals:   05/19/21 2018 05/19/21 2344 05/20/21 0554 05/20/21 0916  BP: 133/83 134/72 (!) 143/82 (!) 154/76  Pulse: 93 76 98 95  Resp: 18 18 20 20   Temp: 98.4 F (36.9 C) 98 F (36.7 C) 97.8 F (36.6 C) 97.6 F (36.4 C)  TempSrc: Oral Oral Oral Oral  SpO2: 97% 99% 97% 98%  Weight:      Height:        Intake/Output Summary (Last 24 hours) at 05/20/2021 1121 Last data filed at 05/20/2021 0144 Gross per 24 hour  Intake 420 ml  Output 900 ml  Net -480 ml   Filed Weights   05/17/21 0954 05/17/21 2203  Weight: 64.9 kg 65.7 kg    Physical Exam   GEN: Well nourished, well developed, in no acute distress.  HEENT: Grossly normal.  Neck: Supple, no JVD, carotid bruits, or masses. Cardiac: RRR, no murmurs, rubs, or gallops. No clubbing, cyanosis, edema.  Radials 2+, DP/PT 2+ and equal bilaterally.  Respiratory:  Respirations regular and unlabored,diminished breath sounds bilaterally. GI: Soft, nontender, nondistended,  BS + x 4. MS: no deformity or atrophy. Skin: warm and dry, no rash. Neuro:  Strength 5/5 on R, 4/5 on left. Psych: AAOx3.  Normal affect.  Labs    Chemistry Recent Labs  Lab 05/17/21 0956 05/18/21 0507 05/19/21 0448 05/20/21 0500  NA 137 138 136 135  K 2.9* 2.7* 4.6 4.0  CL 99 102 105 104  CO2 29 28 25 26   GLUCOSE 119* 107* 122* 117*  BUN 9 8 7* 7*  CREATININE 0.63 0.61 0.58 0.54  CALCIUM 8.0* 7.3* 7.6* 7.9*  PROT 7.0  --   --   --   ALBUMIN 3.5  --   --   --   AST 19  --   --   --   ALT 12  --   --   --   ALKPHOS 132*  --   --   --   BILITOT 0.5  --   --   --   GFRNONAA >60 >60 >60 >60  ANIONGAP 9 8 6 5      Hematology Recent Labs  Lab 05/18/21 0507 05/19/21 0448 05/20/21 0500  WBC 5.8 7.5 6.7  RBC 3.56* 3.87 3.54*  HGB 8.4* 9.3* 8.5*  HCT 27.6* 30.8* 28.3*  MCV 77.5* 79.6* 79.9*  MCH 23.6* 24.0* 24.0*  MCHC 30.4 30.2  30.0  RDW 16.7* 17.0* 17.2*  PLT 299 327 323    Cardiac Enzymes  Recent Labs  Lab 05/17/21 0956  TROPONINIHS 27*      BNP Recent Labs  Lab 05/17/21 0956  BNP 272.7*     Lipids  Lab Results  Component Value Date   CHOL 147 05/17/2021   HDL 38 (L) 05/17/2021   LDLCALC 96 05/17/2021   TRIG 65 05/17/2021   CHOLHDL 3.9 05/17/2021    HbA1c  Lab Results  Component Value Date   HGBA1C 6.2 (H) 05/18/2021    Radiology    CT ANGIO HEAD NECK W WO CM  Result Date: 05/18/2021 CLINICAL DATA:  Left-sided weakness.2 EXAM: CT ANGIOGRAPHY HEAD AND NECK TECHNIQUE: Multidetector CT imaging of the head and neck was performed using the standard protocol during bolus administration of intravenous contrast. Multiplanar CT image reconstructions and MIPs were obtained to evaluate the vascular anatomy. Carotid stenosis measurements (when applicable) are obtained utilizing NASCET criteria, using the distal internal carotid diameter as the denominator. CONTRAST:  67mL OMNIPAQUE IOHEXOL 350 MG/ML SOLN COMPARISON:  Brain MRI from yesterday FINDINGS:  CT HEAD FINDINGS Brain: Very subtle white matter infarct when compared to prior brain MRI. No evidence of progression, hemorrhage, hydrocephalus, or mass. Chronic small vessel ischemia Vascular: See below Skull: Negative Sinuses: Negative Orbits: Negative Review of the MIP images confirms the above findings CTA NECK FINDINGS Aortic arch: Extensive atheromatous plaque with irregular luminal thrombus. Right carotid system: Calcified plaque at the origin without flow limiting stenosis. Partial retropharyngeal course. 60% stenosis at the right ICA origin. No ulceration or dissection. Left carotid system: Atheromatous plaque greatest at the bifurcation with 55% narrowing at the left ICA origin due to calcified plaque. No limiting stenosis or ulceration. Vertebral arteries: Proximal subclavian atherosclerosis without flow reducing stenosis. Calcified plaque at the left vertebral origin, associated stenosis not assessed due to motion artifact. Skeleton: No acute finding Other neck: No acute finding Upper chest: Airway thickening. Review of the MIP images confirms the above findings CTA HEAD FINDINGS Anterior circulation: Atheromatous calcification along the carotid siphons. No branch occlusion, beading, or aneurysm. Mild atheromatous undulation attributed to atherosclerosis. Posterior circulation: Vertebral and basilar arteries are smoothly contoured and widely patent. Atheromatous calcification to a mild degree along the V4 segments. Moderate atheromatous narrowing at the right P2 segment. Venous sinuses: Unremarkable Anatomic variants: None significant Review of the MIP images confirms the above findings IMPRESSION: 1. No emergent finding. 2. Atherosclerosis most notably causing bilateral proximal ICA stenosis measuring 60% on the right and 55% on the left. 3. Especially advanced atherosclerosis of the aorta with 2 sites of luminal thrombus. Electronically Signed   By: Jorje Guild M.D.   On: 05/18/2021 12:07   DG  Chest 2 View  Result Date: 05/17/2021 CLINICAL DATA:  Shortness of breath EXAM: CHEST - 2 VIEW COMPARISON:  Radiographs done on 08/11/2020 and CT done on 02/08/2021 FINDINGS: Transverse diameter of heart is increased. There are no signs of alveolar pulmonary edema. There is transverse linear infiltrate in the right parahilar region. Rest of the lung fields are essentially clear. There is no significant pleural effusion or pneumothorax. There is previous vertebroplasty in the thoracic spine. IMPRESSION: Cardiomegaly. There is transverse linear infiltrate in the right parahilar region suggesting atelectasis/pneumonia. Electronically Signed   By: Elmer Picker M.D.   On: 05/17/2021 10:30   CT HEAD WO CONTRAST  Result Date: 05/17/2021 CLINICAL DATA:  Left-sided weakness EXAM: CT HEAD WITHOUT CONTRAST TECHNIQUE:  Contiguous axial images were obtained from the base of the skull through the vertex without intravenous contrast. COMPARISON:  None. FINDINGS: Brain: There are no signs of bleeding within the cranium. Ventricles are not dilated. Cortical sulci are prominent. There is decreased density in the periventricular and subcortical white matter. Calcifications are seen in the basal ganglia. Vascular: There are scattered arterial calcifications. Skull: Unremarkable. Sinuses/Orbits: There is mucosal thickening in the frontal and ethmoid sinuses. 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 Other: None IMPRESSION: No acute intracranial findings are seen in noncontrast CT brain. Atrophy. Small-vessel disease. Chronic sinusitis. Electronically Signed   By: Elmer Picker M.D.   On: 05/17/2021 10:55   CT Angio Chest PE W and/or Wo Contrast  Result Date: 05/17/2021 CLINICAL DATA:  Left-sided weakness, fatigue EXAM: CT ANGIOGRAPHY CHEST WITH CONTRAST TECHNIQUE: Multidetector CT imaging of the chest was performed using the standard  protocol during bolus administration of intravenous contrast. Multiplanar CT image reconstructions and MIPs were obtained to evaluate the vascular anatomy. CONTRAST:  71mL OMNIPAQUE IOHEXOL 350 MG/ML SOLN COMPARISON:  02/08/2021 FINDINGS: Cardiovascular: Mild cardiomegaly. Trace pericardial effusion. The RV is nondilated. Satisfactory opacification of pulmonary arteries noted, and there is no evidence of pulmonary emboli. Coronary calcifications. Fair contrast opacification of the thoracic aorta. No dissection or stenosis. Atheromatous nonaneurysmal ascending segment. Classic 3 vessel brachiocephalic arterial origin anatomy from the arch without proximal stenosis. Coarse calcified plaque in the arch and did descend distal descending thoracic aorta. Stable eccentric mural thrombus in the distal arch and proximal and mid descending thoracic aorta. 4 cm fusiform aneurysmal dilatation of the distal descending segment, with eccentric nonocclusive mural thrombus. Visualized proximal abdominal aorta is mildly atheromatous, normal in caliber. Mediastinum/Nodes: Small hiatal hernia.  No mass or adenopathy. Lungs/Pleura: No pleural effusion. Platelike atelectasis or consolidation in the anterior right upper lobe. Upper Abdomen: Cholecystectomy clips. Small hiatal hernia. No acute findings. Musculoskeletal: Previous cement augmentation of T9 and T10 vertebral bodies. Stable compression deformities of T6 and T7. No acute fracture. Review of the MIP images confirms the above findings. IMPRESSION: 1. Negative for acute PE or thoracic aortic dissection. 2. Extensive eccentric mural thrombus in the descending thoracic aorta, representing potential source of distal emboli. 3. 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 Recommend semi-annual imaging followup by CTA or MRA and referral to cardiothoracic surgery if not already obtained. This recommendation follows 2010 ACCF/AHA/AATS/ACR/ASA/SCA/SCAI/SIR/STS/SVM  Guidelines for the Diagnosis and Management of Patients With Thoracic Aortic Disease. Circulation. 2010; 121: e266-e36 4. Platelike atelectasis/consolidation in the in the anterior right upper lobe obscuring the previously noted nodule, presumably representing radiation change. Electronically Signed   By: Lucrezia Europe M.D.   On: 05/17/2021 18:53   MR BRAIN WO CONTRAST  Result Date: 05/17/2021 CLINICAL DATA:  Neuro deficit, acute, stroke suspected EXAM: MRI HEAD WITHOUT CONTRAST TECHNIQUE: Multiplanar, multiecho pulse sequences of the brain and surrounding structures were obtained without intravenous contrast. COMPARISON:  Same day CT head. FINDINGS: Mildly motion limited study. 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 Mild associated edema without mass effect. Moderate additional scattered T2/FLAIR hyperintensities within the white matter, nonspecific but compatible with chronic microvascular ischemic disease. No evidence of acute hemorrhage, hydrocephalus, mass lesion, midline shift, or extra-axial fluid collection Vascular: Major arterial flow voids are maintained at the skull base. Skull and upper cervical spine: Normal marrow signal. Sinuses/Orbits: Mild paranasal sinus mucosal  thickening. Unremarkable orbits. Other: No mastoid effusions. IMPRESSION: 1. 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 Mild associated edema without mass effect. 2. Moderate chronic microvascular ischemic disease. Electronically Signed   By: Margaretha Sheffield M.D.   On: 05/17/2021 17:45   US Venous Img Lower Bilateral (DVT)  Result Date: 05/19/2021 CLINICAL DATA:  Lower extremity pain and edema. EXAM: BILATERAL LOWER EXTREMITY VENOUS DOPPLER ULTRASOUND TECHNIQUE: Gray-scale sonography with graded compression, as well as color Doppler and duplex ultrasound were performed to evaluate the lower extremity deep venous systems from the level of the common femoral vein and  including the common femoral, femoral, profunda femoral, popliteal and calf veins including the posterior tibial, peroneal and gastrocnemius veins when visible. The superficial great saphenous vein was also interrogated. Spectral Doppler was utilized to evaluate flow at rest and with distal augmentation maneuvers in the common femoral, femoral and popliteal veins. COMPARISON:  None. FINDINGS: RIGHT LOWER EXTREMITY Common Femoral Vein: No evidence of thrombus. Normal compressibility, respiratory phasicity and response to augmentation. Saphenofemoral Junction: No evidence of thrombus. Normal compressibility and flow on color Doppler imaging. Profunda Femoral Vein: No evidence of thrombus. Normal compressibility and flow on color Doppler imaging. Femoral Vein: No evidence of thrombus. Normal compressibility, respiratory phasicity and response to augmentation. Popliteal Vein: No evidence of thrombus. Normal compressibility, respiratory phasicity and response to augmentation. Calf Veins: No evidence of thrombus. Normal compressibility and flow on color Doppler imaging. Superficial Great Saphenous Vein: No evidence of thrombus. Normal compressibility. Venous Reflux:  None. Other Findings: No evidence of superficial thrombophlebitis or abnormal fluid collection. LEFT LOWER EXTREMITY Common Femoral Vein: No evidence of thrombus. Normal compressibility, respiratory phasicity and response to augmentation. Saphenofemoral Junction: No evidence of thrombus. Normal compressibility and flow on color Doppler imaging. Profunda Femoral Vein: No evidence of thrombus. Normal compressibility and flow on color Doppler imaging. Femoral Vein: No evidence of thrombus. Normal compressibility, respiratory phasicity and response to augmentation. Popliteal Vein: No evidence of thrombus. Normal compressibility, respiratory phasicity and response to augmentation. Calf Veins: No evidence of thrombus. Normal compressibility and flow on color  Doppler imaging. Superficial Great Saphenous Vein: No evidence of thrombus. Normal compressibility. Venous Reflux:  None. Other Findings: Small, thin fluid collection of the left popliteal fossa measures approximately 1.9 x 0.3 x 1.6 cm and is consistent with a Baker's cyst. No evidence of superficial thrombophlebitis. IMPRESSION: No evidence of deep venous thrombosis in either lower extremity. Small left popliteal fossa Baker's cyst. Electronically Signed   By: Aletta Edouard M.D.   On: 05/19/2021 11:00   Telemetry    RSR, 1st deg AVB - Personally Reviewed  Cardiac Studies   2D Echocardiogram 12.27.2022  1. Left ventricular ejection fraction, by estimation, is 35 to 40%. The  left ventricle has moderately decreased function. The left ventricle  demonstrates global hypokinesis. There is mild left ventricular  hypertrophy. Left ventricular diastolic  parameters are consistent with Grade II diastolic dysfunction  (pseudonormalization).   2. Right ventricular systolic function is normal. The right ventricular  size is normal.   3. Left atrial size was mild to moderately dilated.   4. The mitral valve is normal in structure. Mild mitral valve  regurgitation.   5. The aortic valve is tricuspid. Aortic valve regurgitation is not  visualized.   6. The inferior vena cava is dilated in size with <50% respiratory  variability, suggesting right atrial pressure of 15 mmHg.  _____________   Patient Profile  80 y.o. female with a history of hypertension, hyperlipidemia, diabetes, tobacco abuse, lung cancer, and anxiety, who was admitted 12/27 due to acute stroke and has been found to have LV dysfxn (EF 35-40%).  Assessment & Plan    1.  Acute R brain stroke:  Patient admitted with left-sided weakness and found by MRI to have acute infarct in the posterior limb of the right internal capsule.  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  Echocardiogram shows an EF of 35 to 40% with global hypokinesis.  She is currently on aspirin, statin, and Plavix.  Zio to be placed prior to d/c today.  2.  Cardiomyopathy:  Echocardiogram this admission has shown an EF of 35 to 40% with global hypokinesis and grade 2 diastolic dysfunction.  Euvolemic on exam. Minimal HsTrop elevation.  Tolerating ? blocker and farxiga thus far.  BP stable - adding low dose losartan today.  Will arrange for outpt f/u in 2-3 wks.  Pending recovery from stroke, will plan outpt noninvasive eval - nuc vs cor CTA.  3.  Essential HTN:  BP mildly elevated.  Adding ARB in setting of #2.  Cont ? blocker.  4.  HL:  LDL 96.  On statin.  5.  Tob Abuse:  cessation advised.  6.  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  Farxiga added in light of #2.  Per IM.  Signed, Murray Hodgkins, NP  05/20/2021, 11:21 AM    For questions or updates, please contact   Please consult [email or web address] for contact info under Cardiology/STEMI.

## 2021-05-20 NOTE — Progress Notes (Signed)
Physical Therapy Treatment Patient Details Name: Marissa Fletcher MRN: [id number] DOB: June 26, 1940 Today's Date: 05/20/2021   History of Present Illness [name] who presented to [facility] ED yesterday 12//27/22 due to left sided weakness and fatigue upon waking up. 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    PT Comments    Pt received in bed agreeable to PT. Resting on RA with sats at 87-89%. Able to increased to 90-92% with PLB. See separate note for O2 sats. Able to perform bed mobility with extra time and use of bed rails and HOB elevated. With bed elevated and education on techniques to improve ability to stand, pt able to stand with minguard to RW after 2-3 failed attempts. Pt able to progress ambulation on 2L/min via  [place] to ~50' with RW with supervision and remains steady throughout. Max HR up to 97 BPM. Supervision to return to supine. No noted SOB today unlike yesterday and is safely ambulating household distances. PT voiced to pt on concerns of difficulty with standing and getting in and out of bed (as pt's bed doesn't elevate and does not have grab bars) but pt confident in her abilities and reports she will have her grand daughter with her today and more frequent family and friends checking in on her when she returns home. PT does not believe bed mobility is limited to the point of needing a hospital bed and was educated on sleeping on sofa if needed due to concerns of being able to get in/out of bed independently. Pt verbalizing understanding. PT still to recommend SNF due to difficulty with STS transfers and living alone.    Recommendations for follow up therapy are one component of a multi-disciplinary discharge planning process, led by the attending physician.  Recommendations may be updated based on patient status, additional functional criteria and insurance authorization.  Follow Up Recommendations   Skilled nursing-short term rehab (<3 hours/day)     Assistance Recommended at Discharge Frequent or constant Supervision/Assistance (if pt going home, recommend frequent or constant supervision due to difficulty in standing up from surfaces and due to living alone.)  Equipment Recommendations  None recommended by PT (has appropriate equipment if pt is returning home.)    Recommendations for Other Services       Precautions / Restrictions Precautions Precautions: Fall Restrictions Weight Bearing Restrictions: No     Mobility  Bed Mobility Overal bed mobility: Modified Independent             General bed mobility comments: Increased time and effort, increased use of BUE to pull into sitting, HOB elevated Patient Response: Cooperative  Transfers Overall transfer level: Needs assistance   Transfers: Sit to/from Stand Sit to Stand: Min guard;From elevated surface           General transfer comment: Requirs mod VC's for hand palcement and scooting to EOB and anterior weight shift to successfully performing standing.    Ambulation/Gait Ambulation/Gait assistance: Supervision Gait Distance (Feet): 50 Feet Assistive device: Rolling walker (2 wheels) Gait Pattern/deviations: Step-to pattern;Narrow base of support;Trunk flexed;Decreased step length - right;Decreased step length - left       General Gait Details: no LOB noted, mild unsteadiness on feet   Stairs             Wheelchair Mobility    Modified Rankin (Stroke Patients Only)       Balance Overall balance assessment: Needs assistance Sitting-balance  support: No upper extremity supported;Feet supported Sitting balance-Leahy Scale: Good     Standing balance support: Bilateral upper extremity supported;During functional activity Standing balance-Leahy Scale: Fair Standing balance comment: Requires BUE support on RW.                            Cognition Arousal/Alertness:  Awake/alert Behavior During Therapy: WFL for tasks assessed/performed Overall Cognitive Status: Within Functional Limits for tasks assessed                                          Exercises Other Exercises Other Exercises: safe techniques to assist in standing    General Comments General comments (skin integrity, edema, etc.): Max HR up to 97-98BPM with ambulation. See O2 note for O2 sats. Required 2L/min with ambulation to maintain >90%      Pertinent Vitals/Pain Pain Assessment: No/denies pain    Home Living                          Prior Function            PT Goals (current goals can now be found in the care plan section) Acute Rehab PT Goals Patient Stated Goal: to go home PT Goal Formulation: With patient Time For Goal Achievement: 06/01/21 Potential to Achieve Goals: Fair Progress towards PT goals: Progressing toward goals    Frequency    7X/week      PT Plan Current plan remains appropriate    Co-evaluation              AM-PAC PT "6 Clicks" Mobility   Outcome Measure  Help needed turning from your back to your side while in a flat bed without using bedrails?: A Little Help needed moving from lying on your back to sitting on the side of a flat bed without using bedrails?: A Little Help needed moving to and from a bed to a chair (including a wheelchair)?: A Little Help needed standing up from a chair using your arms (e.g., wheelchair or bedside chair)?: A Lot Help needed to walk in hospital room?: A Little Help needed climbing 3-5 steps with a railing? : A Lot 6 Click Score: 16    End of Session Equipment Utilized During Treatment: Gait belt;Oxygen Activity Tolerance: Patient tolerated treatment well Patient left: in bed;with call bell/phone within reach;with bed alarm set;with family/visitor present Nurse Communication: Mobility status PT Visit Diagnosis: Unsteadiness on feet (R26.81);Muscle weakness (generalized)  (M62.81);Difficulty in walking, not elsewhere classified (R26.2);Other abnormalities of gait and mobility (R26.89)     Time: 1041-1105 PT Time Calculation (min) (ACUTE ONLY): 24 min  Charges:  $Therapeutic Activity: 23-37 mins                     Trust Leh M. Fairly IV, PT, DPT Physical Therapist- [facility]  05/20/2021, 11:22 AM

## 2021-05-20 NOTE — Discharge Summary (Addendum)
Physician Discharge Summary  Marissa Fletcher:[id number] DOB: Oct 01, 1940 DOA: 05/17/2021  PCP: Juluis Pitch, MD  Admit date: 05/17/2021 Discharge date: 05/20/2021  Time spent: 26 minutes  Recommendations for Outpatient Follow-up:  Continue aspirin Plavix going forward long-term and outpatient follow-up with oncology etc. 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 with prior pathologic fracture and kyphoplasty 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 Seen by oncology this admission and not felt to have hypercoagulable state-outpatient follow-up with oncology 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 No further work-up at this stage outpatient follow-up   Discharge Exam: Vitals:   05/20/21 0554 05/20/21 0916  BP: (!) 143/82 (!) 154/76  Pulse: 98 95  Resp: 20 20  Temp: 97.8 F (36.6 C) 97.6 F (36.4 C)  SpO2: 97% 98%    Subj on day of d/c   Awake coherent no distress deficits have resolved No chest pain no fever she is adamant about going home  General Exam on discharge  EOMI NCAT no focal deficit power 5/5 upper and lower extremities S1-S2 no murmur Abdomen soft ROM intact Psych euthymic Overall pleasant  Discharge Instructions   Discharge Instructions     Diet - low sodium heart healthy   Complete by: As directed    Increase activity slowly   Complete by: As directed       Allergies as of 05/20/2021       Reactions   Sulfa Antibiotics Swelling        Medication List     STOP taking these medications    diclofenac Sodium 1 % Gel Commonly known as: VOLTAREN   HYDROcodone-acetaminophen 5-325 MG tablet Commonly known as: NORCO/VICODIN   lidocaine 5 % Commonly known as: LIDODERM  lisinopril-hydrochlorothiazide 20-12.5 MG tablet Commonly known as: ZESTORETIC   metaxalone 400 MG tablet Commonly known as: SKELAXIN       TAKE these medications    acetaminophen 500 MG tablet Commonly known as:  TYLENOL Take 2 tablets (1,000 mg total) by mouth 3 (three) times daily as needed for mild pain, moderate pain or headache.   ALPRAZolam 0.25 MG tablet Commonly known as: XANAX Take 0.25 mg by mouth daily as needed for anxiety.   aspirin 81 MG EC tablet Take 1 tablet (81 mg total) by mouth daily. Swallow whole. Start taking on: May 21, 2021   atorvastatin 20 MG tablet Commonly known as: LIPITOR Take 20 mg by mouth daily. AM   carvedilol 3.125 MG tablet Commonly known as: COREG Take 1 tablet (3.125 mg total) by mouth 2 (two) times daily with a meal.   clopidogrel 75 MG tablet Commonly known as: PLAVIX Take 1 tablet (75 mg total) by mouth daily. Start taking on: May 21, 2021   dapagliflozin propanediol 10 MG Tabs tablet Commonly known as: FARXIGA Take 1 tablet (10 mg total) by mouth daily. Start taking on: May 21, 2021   doxepin 25 MG capsule Commonly known as: SINEQUAN Take 50-75 mg by mouth at bedtime.   ferrous [id number] C-folic acid capsule Commonly known as: TRINSICON / FOLTRIN Take 1 capsule by mouth 3 (three) times daily after meals.   gabapentin 300 MG capsule Commonly known as: NEURONTIN Take 300 mg by mouth at bedtime.   losartan 25 MG tablet Commonly known as: COZAAR Take 0.5 tablets (12.5 mg total) by mouth daily.   metFORMIN 500 MG 24 hr tablet Commonly known as: GLUCOPHAGE-XR Take 500 mg by mouth 2 (two) times daily.   nicotine 21 mg/24hr patch Commonly known as: NICODERM CQ - dosed in mg/24 hours One 21 mg patch chest wall daily (okay to substitute generic)   PARoxetine 30 MG tablet Commonly known as: PAXIL Take 30 mg by mouth every morning.   polyethylene glycol 17 g packet Commonly known as: MIRALAX / GLYCOLAX Take 17 g by mouth daily as needed for moderate constipation.       Allergies  Allergen Reactions   Sulfa Antibiotics Swelling      The results of significant diagnostics from this hospitalization  (including imaging, microbiology, ancillary and laboratory) are listed below for reference.    Significant Diagnostic Studies: CT ANGIO HEAD NECK W WO CM  Result Date: 05/18/2021 CLINICAL DATA:  Left-sided weakness.2 EXAM: CT ANGIOGRAPHY HEAD AND NECK TECHNIQUE: Multidetector CT imaging of the head and neck was performed using the standard protocol during bolus administration of intravenous contrast. Multiplanar CT image reconstructions and MIPs were obtained to evaluate the vascular anatomy. Carotid stenosis measurements (when applicable) are obtained utilizing NASCET criteria, using the distal internal carotid diameter as the denominator. CONTRAST:  42mL OMNIPAQUE IOHEXOL 350 MG/ML SOLN COMPARISON:  Brain MRI from yesterday FINDINGS: CT HEAD FINDINGS Brain: Very subtle white matter infarct when compared to prior brain MRI. No evidence of progression, hemorrhage, hydrocephalus, or mass. Chronic small vessel ischemia Vascular: See below Skull: Negative Sinuses: Negative Orbits: Negative Review of the MIP images confirms the above findings CTA NECK FINDINGS Aortic arch: Extensive atheromatous plaque with irregular luminal thrombus. Right carotid system: Calcified plaque at the origin without flow limiting stenosis. Partial retropharyngeal course. 60% stenosis at the right ICA origin. No ulceration or dissection. Left carotid system: Atheromatous plaque greatest at the bifurcation with 55% narrowing at the left ICA origin  due to calcified plaque. No limiting stenosis or ulceration. Vertebral arteries: Proximal subclavian atherosclerosis without flow reducing stenosis. Calcified plaque at the left vertebral origin, associated stenosis not assessed due to motion artifact. Skeleton: No acute finding Other neck: No acute finding Upper chest: Airway thickening. Review of the MIP images confirms the above findings CTA HEAD FINDINGS Anterior circulation: Atheromatous calcification along the carotid siphons. No branch  occlusion, beading, or aneurysm. Mild atheromatous undulation attributed to atherosclerosis. Posterior circulation: Vertebral and basilar arteries are smoothly contoured and widely patent. Atheromatous calcification to a mild degree along the V4 segments. Moderate atheromatous narrowing at the right P2 segment. Venous sinuses: Unremarkable Anatomic variants: None significant Review of the MIP images confirms the above findings IMPRESSION: 1. No emergent finding. 2. Atherosclerosis most notably causing bilateral proximal ICA stenosis measuring 60% on the right and 55% on the left. 3. Especially advanced atherosclerosis of the aorta with 2 sites of luminal thrombus. Electronically Signed   By: Jorje Guild M.D.   On: 05/18/2021 12:07   DG Chest 2 View  Result Date: 05/17/2021 CLINICAL DATA:  Shortness of breath EXAM: CHEST - 2 VIEW COMPARISON:  Radiographs done on 08/11/2020 and CT done on 02/08/2021 FINDINGS: Transverse diameter of heart is increased. There are no signs of alveolar pulmonary edema. There is transverse linear infiltrate in the right parahilar region. Rest of the lung fields are essentially clear. There is no significant pleural effusion or pneumothorax. There is previous vertebroplasty in the thoracic spine. IMPRESSION: Cardiomegaly. There is transverse linear infiltrate in the right parahilar region suggesting atelectasis/pneumonia. Electronically Signed   By: Elmer Picker M.D.   On: 05/17/2021 10:30   CT HEAD WO CONTRAST  Result Date: 05/17/2021 CLINICAL DATA:  Left-sided weakness EXAM: CT HEAD WITHOUT CONTRAST TECHNIQUE: Contiguous axial images were obtained from the base of the skull through the vertex without intravenous contrast. COMPARISON:  None. FINDINGS: Brain: There are no signs of bleeding within the cranium. Ventricles are not dilated. Cortical sulci are prominent. There is decreased density in the periventricular and subcortical white matter. Calcifications are seen  in the basal ganglia. Vascular: There are scattered arterial calcifications. Skull: Unremarkable. Sinuses/Orbits: There is mucosal thickening in the frontal and ethmoid sinuses. 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 Other: None IMPRESSION: No acute intracranial findings are seen in noncontrast CT brain. Atrophy. Small-vessel disease. Chronic sinusitis. Electronically Signed   By: Elmer Picker M.D.   On: 05/17/2021 10:55   CT Angio Chest PE W and/or Wo Contrast  Result Date: 05/17/2021 CLINICAL DATA:  Left-sided weakness, fatigue EXAM: CT ANGIOGRAPHY CHEST WITH CONTRAST TECHNIQUE: Multidetector CT imaging of the chest was performed using the standard protocol during bolus administration of intravenous contrast. Multiplanar CT image reconstructions and MIPs were obtained to evaluate the vascular anatomy. CONTRAST:  54mL OMNIPAQUE IOHEXOL 350 MG/ML SOLN COMPARISON:  02/08/2021 FINDINGS: Cardiovascular: Mild cardiomegaly. Trace pericardial effusion. The RV is nondilated. Satisfactory opacification of pulmonary arteries noted, and there is no evidence of pulmonary emboli. Coronary calcifications. Fair contrast opacification of the thoracic aorta. No dissection or stenosis. Atheromatous nonaneurysmal ascending segment. Classic 3 vessel brachiocephalic arterial origin anatomy from the arch without proximal stenosis. Coarse calcified plaque in the arch and did descend distal descending thoracic aorta. Stable eccentric mural thrombus in the distal arch and proximal and mid descending thoracic aorta. 4 cm fusiform aneurysmal dilatation of the distal descending segment, with eccentric nonocclusive mural  thrombus. Visualized proximal abdominal aorta is mildly atheromatous, normal in caliber. Mediastinum/Nodes: Small hiatal hernia.  No mass or adenopathy. Lungs/Pleura: No pleural effusion. Platelike atelectasis or consolidation in the  anterior right upper lobe. Upper Abdomen: Cholecystectomy clips. Small hiatal hernia. No acute findings. Musculoskeletal: Previous cement augmentation of T9 and T10 vertebral bodies. Stable compression deformities of T6 and T7. No acute fracture. Review of the MIP images confirms the above findings. IMPRESSION: 1. Negative for acute PE or thoracic aortic dissection. 2. Extensive eccentric mural thrombus in the descending thoracic aorta, representing potential source of distal emboli. 3. 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 Recommend semi-annual imaging followup by CTA or MRA and referral to cardiothoracic surgery if not already obtained. This recommendation follows 2010 ACCF/AHA/AATS/ACR/ASA/SCA/SCAI/SIR/STS/SVM Guidelines for the Diagnosis and Management of Patients With Thoracic Aortic Disease. Circulation. 2010; 121: e266-e36 4. Platelike atelectasis/consolidation in the in the anterior right upper lobe obscuring the previously noted nodule, presumably representing radiation change. Electronically Signed   By: Lucrezia Europe M.D.   On: 05/17/2021 18:53   MR BRAIN WO CONTRAST  Result Date: 05/17/2021 CLINICAL DATA:  Neuro deficit, acute, stroke suspected EXAM: MRI HEAD WITHOUT CONTRAST TECHNIQUE: Multiplanar, multiecho pulse sequences of the brain and surrounding structures were obtained without intravenous contrast. COMPARISON:  Same day CT head. FINDINGS: Mildly motion limited study. 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 Mild associated edema without mass effect. Moderate additional scattered T2/FLAIR hyperintensities within the white matter, nonspecific but compatible with chronic microvascular ischemic disease. No evidence of acute hemorrhage, hydrocephalus, mass lesion, midline shift, or extra-axial fluid collection Vascular: Major arterial flow voids are maintained at the skull base. Skull and upper cervical spine: Normal marrow signal.  Sinuses/Orbits: Mild paranasal sinus mucosal thickening. Unremarkable orbits. Other: No mastoid effusions. IMPRESSION: 1. 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 Mild associated edema without mass effect. 2. Moderate chronic microvascular ischemic disease. Electronically Signed   By: Margaretha Sheffield M.D.   On: 05/17/2021 17:45   US Venous Img Lower Bilateral (DVT)  Result Date: 05/19/2021 CLINICAL DATA:  Lower extremity pain and edema. EXAM: BILATERAL LOWER EXTREMITY VENOUS DOPPLER ULTRASOUND TECHNIQUE: Gray-scale sonography with graded compression, as well as color Doppler and duplex ultrasound were performed to evaluate the lower extremity deep venous systems from the level of the common femoral vein and including the common femoral, femoral, profunda femoral, popliteal and calf veins including the posterior tibial, peroneal and gastrocnemius veins when visible. The superficial great saphenous vein was also interrogated. Spectral Doppler was utilized to evaluate flow at rest and with distal augmentation maneuvers in the common femoral, femoral and popliteal veins. COMPARISON:  None. FINDINGS: RIGHT LOWER EXTREMITY Common Femoral Vein: No evidence of thrombus. Normal compressibility, respiratory phasicity and response to augmentation. Saphenofemoral Junction: No evidence of thrombus. Normal compressibility and flow on color Doppler imaging. Profunda Femoral Vein: No evidence of thrombus. Normal compressibility and flow on color Doppler imaging. Femoral Vein: No evidence of thrombus. Normal compressibility, respiratory phasicity and response to augmentation. Popliteal Vein: No evidence of thrombus. Normal compressibility, respiratory phasicity and response to augmentation. Calf Veins: No evidence of thrombus. Normal compressibility and flow on color Doppler imaging. Superficial Great Saphenous Vein: No evidence of thrombus. Normal compressibility. Venous Reflux:  None. Other  Findings: No evidence of superficial thrombophlebitis or abnormal fluid collection. LEFT LOWER EXTREMITY Common Femoral Vein: No evidence of thrombus. Normal compressibility, respiratory phasicity and response to  augmentation. Saphenofemoral Junction: No evidence of thrombus. Normal compressibility and flow on color Doppler imaging. Profunda Femoral Vein: No evidence of thrombus. Normal compressibility and flow on color Doppler imaging. Femoral Vein: No evidence of thrombus. Normal compressibility, respiratory phasicity and response to augmentation. Popliteal Vein: No evidence of thrombus. Normal compressibility, respiratory phasicity and response to augmentation. Calf Veins: No evidence of thrombus. Normal compressibility and flow on color Doppler imaging. Superficial Great Saphenous Vein: No evidence of thrombus. Normal compressibility. Venous Reflux:  None. Other Findings: Small, thin fluid collection of the left popliteal fossa measures approximately 1.9 x 0.3 x 1.6 cm and is consistent with a Baker's cyst. No evidence of superficial thrombophlebitis. IMPRESSION: No evidence of deep venous thrombosis in either lower extremity. Small left popliteal fossa Baker's cyst. Electronically Signed   By: Aletta Edouard M.D.   On: 05/19/2021 11:00   ECHOCARDIOGRAM COMPLETE  Result Date: 05/18/2021    ECHOCARDIOGRAM REPORT   Patient Name:   Marissa Fletcher Date of Exam: 05/17/2021 Medical Rec #:  [id number]       Height:       62.0 in Accession #:    [id number]      Weight:       143.0 lb Date of Birth:  06-16-1940       BSA:          1.658 m Patient Age:    53 years        BP:           122/58 mmHg Patient Gender: F               HR:           94 bpm. Exam Location:  [facility] Procedure: 2D Echo, Cardiac Doppler and Color Doppler Indications:     I63.9 Stroke  History:         Patient has no prior history of Echocardiogram examinations.                  COPD; Risk Factors:Hypertension and Diabetes.  Sonographer:      Cresenciano Lick RDCS Referring Phys:  [id number] Otila Kluver LAI Diagnosing Phys: Kate Sable MD IMPRESSIONS  1. Left ventricular ejection fraction, by estimation, is 35 to 40%. The left ventricle has moderately decreased function. The left ventricle demonstrates global hypokinesis. There is mild left ventricular hypertrophy. Left ventricular diastolic parameters are consistent with Grade II diastolic dysfunction (pseudonormalization).  2. Right ventricular systolic function is normal. The right ventricular size is normal.  3. Left atrial size was mild to moderately dilated.  4. The mitral valve is normal in structure. Mild mitral valve regurgitation.  5. The aortic valve is tricuspid. Aortic valve regurgitation is not visualized.  6. The inferior vena cava is dilated in size with <50% respiratory variability, suggesting right atrial pressure of 15 mmHg. FINDINGS  Left Ventricle: Left ventricular ejection fraction, by estimation, is 35 to 40%. The left ventricle has moderately decreased function. The left ventricle demonstrates global hypokinesis. The left ventricular internal cavity size was normal in size. There is mild left ventricular hypertrophy. Left ventricular diastolic parameters are consistent with Grade II diastolic dysfunction (pseudonormalization). Right Ventricle: The right ventricular size is normal. No increase in right ventricular wall thickness. Right ventricular systolic function is normal. Left Atrium: Left atrial size was mild to moderately dilated. Right Atrium: Right atrial size was normal in size. Pericardium: There is no evidence of pericardial effusion. Mitral Valve: The mitral valve is normal in  structure. Mild mitral valve regurgitation. Tricuspid Valve: The tricuspid valve is normal in structure. Tricuspid valve regurgitation is not demonstrated. Aortic Valve: The aortic valve is tricuspid. Aortic valve regurgitation is not visualized. Aortic valve peak gradient measures 8.3 mmHg.  Pulmonic Valve: The pulmonic valve was normal in structure. Pulmonic valve regurgitation is not visualized. Aorta: The aortic root and ascending aorta are structurally normal, with no evidence of dilitation. Venous: The inferior vena cava is dilated in size with less than 50% respiratory variability, suggesting right atrial pressure of 15 mmHg. IAS/Shunts: No atrial level shunt detected by color flow Doppler.  LEFT VENTRICLE PLAX 2D LVIDd:         5.50 cm Diastology LVIDs:         4.20 cm LV e' medial:    7.51 cm/s LV PW:         1.10 cm LV E/e' medial:  14.4 LV IVS:        1.10 cm LV e' lateral:   7.72 cm/s                        LV E/e' lateral: 14.0  RIGHT VENTRICLE             IVC RV Basal diam:  3.60 cm     IVC diam: 2.10 cm RV S prime:     15.50 cm/s TAPSE (M-mode): 2.3 cm LEFT ATRIUM             Index        RIGHT ATRIUM           Index LA diam:        4.60 cm 2.77 cm/m   RA Area:     11.90 cm LA Vol (A2C):   52.3 ml 31.55 ml/m  RA Volume:   27.70 ml  16.71 ml/m LA Vol (A4C):   71.4 ml 43.07 ml/m LA Biplane Vol: 64.5 ml 38.91 ml/m  AORTIC VALVE AV Vmax:      144.00 cm/s AV Peak Grad: 8.3 mmHg  AORTA Ao Root diam: 3.50 cm Ao Asc diam:  3.30 cm MITRAL VALVE MV Area (PHT): 3.27 cm MV Decel Time: 232 msec MV E velocity: 108.00 cm/s MV A velocity: 99.50 cm/s MV E/A ratio:  1.09 Kate Sable MD Electronically signed by Kate Sable MD Signature Date/Time: 05/18/2021/4:40:37 PM    Final     Microbiology: Recent Results (from the past 240 hour(s))  Blood culture (routine x 2)     Status: None (Preliminary result)   Collection Time: 05/17/21  4:35 PM   Specimen: BLOOD  Result Value Ref Range Status   Specimen Description BLOOD BLOOD RIGHT FOREARM  Final   Special Requests   Final    BOTTLES DRAWN AEROBIC AND ANAEROBIC Blood Culture results may not be optimal due to an excessive volume of blood received in culture bottles   Culture   Final    NO GROWTH 3 DAYS Performed at [facility], [address] [place], [place] [address]    Report Status PENDING  Incomplete  Blood culture (routine x 2)     Status: None (Preliminary result)   Collection Time: 05/17/21  4:40 PM   Specimen: BLOOD  Result Value Ref Range Status   Specimen Description BLOOD RIGHT ANTECUBITAL  Final   Special Requests   Final    BOTTLES DRAWN AEROBIC AND ANAEROBIC Blood Culture results may not be optimal due to an excessive volume  of blood received in culture bottles   Culture   Final    NO GROWTH 3 DAYS Performed at [facility], [address] [place], [place] [address]    Report Status PENDING  Incomplete  Resp Panel by RT-PCR (Flu A&B, Covid) Nasopharyngeal Swab     Status: None   Collection Time: 05/17/21  5:07 PM   Specimen: Nasopharyngeal Swab; Nasopharyngeal(NP) swabs in vial transport medium  Result Value Ref Range Status   SARS Coronavirus 2 by RT PCR NEGATIVE NEGATIVE Final    Comment: (NOTE) SARS-CoV-2 target nucleic acids are NOT DETECTED.  The SARS-CoV-2 RNA is generally detectable in upper respiratory specimens during the acute phase of infection. The lowest concentration of SARS-CoV-2 viral copies this assay can detect is 138 copies/mL. A negative result does not preclude SARS-Cov-2 infection and should not be used as the sole basis for treatment or other patient management decisions. A negative result may occur with  improper specimen collection/handling, submission of specimen other than nasopharyngeal swab, presence of viral mutation(s) within the areas targeted by this assay, and inadequate number of viral copies(<138 copies/mL). A negative result must be combined with clinical observations, patient history, and epidemiological information. The expected result is Negative.  Fact Sheet for Patients:  EntrepreneurPulse.com.au  Fact Sheet for Healthcare Providers:  IncredibleEmployment.be  This test is no t yet approved  or cleared by the [place] FDA and  has been authorized for detection and/or diagnosis of SARS-CoV-2 by FDA under an Emergency Use Authorization (EUA). This EUA will remain  in effect (meaning this test can be used) for the duration of the COVID-19 declaration under Section 564(b)(1) of the Act, 21 U.S.C.section 360bbb-3(b)(1), unless the authorization is terminated  or revoked sooner.       Influenza A by PCR NEGATIVE NEGATIVE Final   Influenza B by PCR NEGATIVE NEGATIVE Final    Comment: (NOTE) The Xpert Xpress SARS-CoV-2/FLU/RSV plus assay is intended as an aid in the diagnosis of influenza from Nasopharyngeal swab specimens and should not be used as a sole basis for treatment. Nasal washings and aspirates are unacceptable for Xpert Xpress SARS-CoV-2/FLU/RSV testing.  Fact Sheet for Patients: EntrepreneurPulse.com.au  Fact Sheet for Healthcare Providers: IncredibleEmployment.be  This test is not yet approved or cleared by the [place] FDA and has been authorized for detection and/or diagnosis of SARS-CoV-2 by FDA under an Emergency Use Authorization (EUA). This EUA will remain in effect (meaning this test can be used) for the duration of the COVID-19 declaration under Section 564(b)(1) of the Act, 21 U.S.C. section 360bbb-3(b)(1), unless the authorization is terminated or revoked.  Performed at [facility], [address] [place], [place] [address]      Labs: Basic Metabolic Panel: Recent Labs  Lab 05/17/21 0956 05/18/21 0507 05/19/21 0448 05/20/21 0500  NA 137 138 136 135  K 2.9* 2.7* 4.6 4.0  CL 99 102 105 104  CO2 29 28 25 26   GLUCOSE 119* 107* 122* 117*  BUN 9 8 7* 7*  CREATININE 0.63 0.61 0.58 0.54  CALCIUM 8.0* 7.3* 7.6* 7.9*  MG 1.0* 1.0* 2.2 2.1   Liver Function Tests: Recent Labs  Lab 05/17/21 0956  AST 19  ALT 12  ALKPHOS 132*  BILITOT 0.5  PROT 7.0  ALBUMIN 3.5   No results for  input(s): LIPASE, AMYLASE in the last 168 hours. No results for input(s): AMMONIA in the last 168 hours. CBC: Recent Labs  Lab 05/17/21 0956 05/18/21 0507  05/19/21 0448 05/20/21 0500  WBC 6.8 5.8 7.5 6.7  NEUTROABS 5.2  --   --   --   HGB 9.6* 8.4* 9.3* 8.5*  HCT 30.7* 27.6* 30.8* 28.3*  MCV 77.1* 77.5* 79.6* 79.9*  PLT 323 299 327 323   Cardiac Enzymes: No results for input(s): CKTOTAL, CKMB, CKMBINDEX, TROPONINI in the last 168 hours. BNP: BNP (last 3 results) Recent Labs    05/17/21 0956  BNP 272.7*    ProBNP (last 3 results) No results for input(s): PROBNP in the last 8760 hours.  CBG: Recent Labs  Lab 05/17/21 2159 05/18/21 0802  GLUCAP 116* 113*       Signed:  Nita Sells MD   [facility] 05/20/2021, 9:40 AM

## 2021-05-20 NOTE — Progress Notes (Signed)
SATURATION QUALIFICATIONS: (This note is used to comply with regulatory documentation for home oxygen)  Patient Saturations on Room Air at Rest = 86%  Patient Saturations on Room Air while Ambulating = 86%  Patient Saturations on 2 Liters of oxygen while Ambulating = 92%  Please briefly explain why patient needs home oxygen:

## 2021-05-22 LAB — CULTURE, BLOOD (ROUTINE X 2)
Culture: NO GROWTH
Culture: NO GROWTH

## 2021-05-24 NOTE — Telephone Encounter (Signed)
Patient Sister  and POA calling to discuss below. Please call [phone] .

## 2021-05-24 NOTE — Telephone Encounter (Signed)
Patient can check with her insurance if Vania Rea would be preferred and less expensive.  Otherwise, I recommend that she apply for medication assistance from the manufacturer of Farxiga.  Nelva Bush, MD [facility]

## 2021-06-07 NOTE — Progress Notes (Signed)
Follow-up Outpatient Visit Date: 06/08/2021  Primary Care Provider: Juluis Pitch, MD [address] [address]  Chief Complaint: Follow-up cardiomyopathy and recent stroke  HPI:  Marissa Fletcher is a 81 y.o. female with history of recent stroke with extensive aortic and carotid atherosclerosis, recently diagnosed cardiomyopathy (LVEF 35-40%), hypertension, hyperlipidemia, diabetes mellitus, lung cancer, tobacco abuse, and anxiety, who presents for follow-up of stroke.  She was hospitalized in late December with an acute stroke.  CTA of the neck showed nonobstructive carotid artery disease and extensive atherosclerosis with mural thrombus involving the aorta.  Echo during that admission revealed LVEF of 35-40% with global hypokinesis and grade 2 diastolic dysfunction.  She was initiated on low-dose goal-directed medical therapy and discharged with ambulatory cardiac monitoring.  TEE was not pursued on the recommendation of neurology.  Monitor showed rare PACs and PVCs as well as 5 brief episodes of PSVT.  No atrial fibrillation/flutter was identified.  Today, Ms. [name] reports that she is feeling a little better than when she left the hospital.  Her left side continues to strengthen, though it is not back to normal.  She is participating in physical therapy.  She notes that her left foot/ankle has remained swollen, which predates her hospitalization for stroke.  Lower extremity venous duplex during that admission was negative for DVT, the left Baker's cyst was identified.  She has not had any chest pain, shortness of breath, palpitations, or orthopnea.  She notes occasional orthostatic lightheadedness.  She has been tracking her blood pressure at home and has not had any low readings.  She has been unable to procure dapagliflozin due to cost.  --------------------------------------------------------------------------------------------------  Past Medical History:  Diagnosis Date    Anxiety    Aortic atherosclerosis ([place])    Arthritis    Cardiomyopathy ([place])    a. 04/2021 Echo: EF 35-40%, glob HK. GrII DD.  Nl RV size/fxn. Mod dil LA. Mild MR.   Carotid arterial disease ([place])    a. 04/2021 CTA Head/neck: RICA 60, LICA 55.   COPD (chronic obstructive pulmonary disease) (H[facility])    Depression    Diabetes mellitus without complication ([place])    History of kidney stones    Hypertension    Lung cancer ([place])    Stroke (cerebrum) ([place])    a. 04/2021 MRI brain: Acute infarct post limb of R internal capsule.   Thoracic aortic aneurysm    Tobacco abuse    Past Surgical History:  Procedure Laterality Date   ABDOMINAL HYSTERECTOMY     CHOLECYSTECTOMY     EYE SURGERY Bilateral    KYPHOPLASTY N/A 07/30/2020   Procedure: T10 Kyphoplasty;  Surgeon: Hessie Knows, MD;  Location: [facility] ORS;  Service: Orthopedics;  Laterality: N/A;  T10   KYPHOPLASTY N/A 08/19/2020   Procedure: T9 YPHOPLASTY;  Surgeon: Hessie Knows, MD;  Location: [facility] ORS;  Service: Orthopedics;  Laterality: N/A;     Recent CV Pertinent Labs: Lab Results  Component Value Date   CHOL 147 05/17/2021   HDL 38 (L) 05/17/2021   LDLCALC 96 05/17/2021   TRIG 65 05/17/2021   CHOLHDL 3.9 05/17/2021   INR 1.0 05/17/2021   BNP 272.7 (H) 05/17/2021   K 4.0 05/20/2021   MG 2.1 05/20/2021   BUN 7 (L) 05/20/2021   CREATININE 0.54 05/20/2021    Past medical and surgical history were reviewed and updated in EPIC.  Current Meds  Medication Sig   acetaminophen (TYLENOL) 500 MG tablet Take 2 tablets (1,000  mg total) by mouth 3 (three) times daily as needed for mild pain, moderate pain or headache.   aspirin EC 81 MG EC tablet Take 1 tablet (81 mg total) by mouth daily. Swallow whole.   atorvastatin (LIPITOR) 20 MG tablet Take 20 mg by mouth every Monday, Wednesday, and Friday.   carvedilol (COREG) 3.125 MG tablet Take 1 tablet (3.125 mg total) by mouth 2 (two) times daily with a meal.   clopidogrel (PLAVIX) 75 MG  tablet Take 1 tablet (75 mg total) by mouth daily.   doxepin (SINEQUAN) 25 MG capsule Take 50-75 mg by mouth at bedtime.   ferrous [id number] C-folic acid (TRINSICON / FOLTRIN) capsule Take 1 capsule by mouth 3 (three) times daily after meals.   gabapentin (NEURONTIN) 300 MG capsule Take 300 mg by mouth at bedtime.   losartan (COZAAR) 25 MG tablet Take 0.5 tablets (12.5 mg total) by mouth daily.   metFORMIN (GLUCOPHAGE-XR) 500 MG 24 hr tablet Take 500 mg by mouth 2 (two) times daily.   PARoxetine (PAXIL) 30 MG tablet Take 30 mg by mouth every morning.   polyethylene glycol (MIRALAX / GLYCOLAX) 17 g packet Take 17 g by mouth daily as needed for moderate constipation.    Allergies: Sulfa antibiotics  Social History   Tobacco Use   Smoking status: Every Day    Packs/day: 0.50    Years: 68.00    Pack years: 34.00    Types: Cigarettes   Smokeless tobacco: Never  Vaping Use   Vaping Use: Never used  Substance Use Topics   Alcohol use: Never   Drug use: Never    Family History  Problem Relation Age of Onset   Dementia Mother    Congestive Heart Failure Mother    Bladder Cancer Father    Heart disease Father    Breast cancer Neg Hx     Review of Systems: A 12-system review of systems was performed and was negative except as noted in the HPI.  --------------------------------------------------------------------------------------------------  Physical Exam: BP 110/66 (BP Location: Left Arm, Patient Position: Sitting, Cuff Size: Normal)    Pulse 78    Ht 5\' 2"  (1.575 m)    Wt 142 lb (64.4 kg)    SpO2 97%    BMI 25.97 kg/m   General:  NAD. Neck: No JVD or HJR. Lungs: Clear to auscultation bilaterally without wheezes or crackles. Heart: Regular rate and rhythm without murmurs, rubs, or gallops. Abdomen: Soft, nontender, nondistended. Extremities: 1-2+ left and trace right ankle and foot edema.  EKG: Baseline artifact.  Normal sinus rhythm with first-degree AV block  and lateral T wave inversions.  No significant change from prior tracing on 05/17/2021.  Lab Results  Component Value Date   WBC 6.7 05/20/2021   HGB 8.5 (L) 05/20/2021   HCT 28.3 (L) 05/20/2021   MCV 79.9 (L) 05/20/2021   PLT 323 05/20/2021    Lab Results  Component Value Date   NA 135 05/20/2021   K 4.0 05/20/2021   CL 104 05/20/2021   CO2 26 05/20/2021   BUN 7 (L) 05/20/2021   CREATININE 0.54 05/20/2021   GLUCOSE 117 (H) 05/20/2021   ALT 12 05/17/2021    Lab Results  Component Value Date   CHOL 147 05/17/2021   HDL 38 (L) 05/17/2021   LDLCALC 96 05/17/2021   TRIG 65 05/17/2021   CHOLHDL 3.9 05/17/2021    --------------------------------------------------------------------------------------------------  ASSESSMENT AND PLAN: Chronic HFrEF of uncertain etiology: Ms. Susan has some asymmetric  leg edema, left greater than right, which predates her hospitalization last month.  Lower extremity venous duplex during her admission was negative for DVT.  She does not have significant JVD on exam.  We will initiate furosemide 20 mg daily.  We will continue current doses of carvedilol and losartan, given low normal blood pressure and intermittent orthostatic lightheadedness.  We discussed further ischemia evaluation options and have agreed to obtain a coronary CTA, as we would like to avoid catheterization, if possible, given recent stroke and extensive aortic atherosclerosis placing her at high risk for further atheroembolic events.  We have also provided her with an application for pharmacy assistance for dapagliflozin.  Stroke and aortic atherosclerosis: No new neurologic deficits.  Recent event monitor results were reviewed today, showing no evidence of atrial fibrillation/flutter.  Suspect stroke was caused by atheroembolic event in the setting of significant aortic and carotid artery atherosclerosis.  Ms. Doria should continue aggressive secondary prevention including  atorvastatin, aspirin, and clopidogrel.  She does not have scheduled neurology follow-up; I have asked her to speak with her PCP about this.  Hyperlipidemia associated with type 2 diabetes mellitus: LDL normal but above goal during recent hospitalization.  She is currently on low-dose atorvastatin, which can be continued though I would recommend escalation in the setting of extensive atherosclerotic disease if her LDL remains above 50 on recheck.  Ongoing management of diabetes mellitus per Dr. Lovie Macadamia.  Tobacco abuse: Smoking cessation encouraged.  Ms. Shirkey notes that she has cut down and would like to quit though she thinks this will be difficult.  Follow-up: Return to clinic in 1 month.  Nelva Bush, MD 06/08/2021 11:08 AM

## 2021-06-08 ENCOUNTER — Other Ambulatory Visit: Payer: Self-pay

## 2021-06-08 ENCOUNTER — Ambulatory Visit (INDEPENDENT_AMBULATORY_CARE_PROVIDER_SITE_OTHER): Payer: Medicare Other | Admitting: Internal Medicine

## 2021-06-08 ENCOUNTER — Encounter: Payer: Self-pay | Admitting: Internal Medicine

## 2021-06-08 VITALS — BP 110/66 | HR 78 | Ht 62.0 in | Wt 142.0 lb

## 2021-06-08 DIAGNOSIS — I639 Cerebral infarction, unspecified: Secondary | ICD-10-CM | POA: Diagnosis not present

## 2021-06-08 DIAGNOSIS — I700 Atherosclerosis of aorta: Secondary | ICD-10-CM | POA: Insufficient documentation

## 2021-06-08 DIAGNOSIS — E1169 Type 2 diabetes mellitus with other specified complication: Secondary | ICD-10-CM

## 2021-06-08 DIAGNOSIS — Z720 Tobacco use: Secondary | ICD-10-CM

## 2021-06-08 DIAGNOSIS — I5022 Chronic systolic (congestive) heart failure: Secondary | ICD-10-CM | POA: Insufficient documentation

## 2021-06-08 DIAGNOSIS — E785 Hyperlipidemia, unspecified: Secondary | ICD-10-CM

## 2021-06-08 MED ORDER — METOPROLOL TARTRATE 100 MG PO TABS
100.0000 mg | ORAL_TABLET | Freq: Once | ORAL | 0 refills | Status: DC
Start: 2021-06-08 — End: 2021-07-13

## 2021-06-08 MED ORDER — FUROSEMIDE 20 MG PO TABS
20.0000 mg | ORAL_TABLET | Freq: Every day | ORAL | 1 refills | Status: DC
Start: 2021-06-08 — End: 2021-06-24

## 2021-06-08 NOTE — Patient Instructions (Signed)
Medication Instructions:   Your physician has recommended you make the following change in your medication:   START Furosemide (Lasix) 20 mg daily    *If you need a refill on your cardiac medications before your next appointment, please call your pharmacy*   Lab Work:  None ordered  Testing/Procedures:  Your cardiac CT has been scheduled Thursday 06/23/21 at 2:00 PM at the below location:  [facility] [place], [place] [address] [phone]  Please arrive 15 mins early for check-in and test prep.  Please follow these instructions carefully (unless otherwise directed):   On the Night Before the Test: Be sure to Drink plenty of water. Do not consume any caffeinated/decaffeinated beverages or chocolate 12 hours prior to your test. Do not take any antihistamines 12 hours prior to your test.   On the Day of the Test: Drink plenty of water until 1 hour prior to the test. Do not eat any food 4 hours prior to the test. You may take your regular medications prior to the test.  Take metoprolol (Lopressor) two hours prior to test. HOLD Furosemide morning of the test. FEMALES- please wear underwire-free bra if available, avoid dresses & tight clothing       After the Test: Drink plenty of water. After receiving IV contrast, you may experience a mild flushed feeling. This is normal. On occasion, you may experience a mild rash up to 24 hours after the test. This is not dangerous. If this occurs, you can take Benadryl 25 mg and increase your fluid intake. If you experience trouble breathing, this can be serious. If it is severe call 911 IMMEDIATELY. If it is mild, please call our office. If you take any of these medications: Glipizide/Metformin, Avandament, Glucavance, please do not take 48 hours after completing test unless otherwise instructed.  We will call to schedule your test 2-4 weeks out understanding that some  insurance companies will need an authorization prior to the service being performed.   For non-scheduling related questions, please contact the cardiac imaging nurse navigator should you have any questions/concerns: Marchia Bond, Cardiac Imaging Nurse Navigator Gordy Clement, Cardiac Imaging Nurse Navigator [facility] Direct Office Dial: [phone]   For scheduling needs, including cancellations and rescheduling, please call [place], [phone].    Follow-Up: At [facility], you and your health needs are our priority.  As part of our continuing mission to provide you with exceptional heart care, we have created designated Provider Care Teams.  These Care Teams include your primary Cardiologist (physician) and Advanced Practice Providers (APPs -  Physician Assistants and Nurse Practitioners) who all work together to provide you with the care you need, when you need it.  We recommend signing up for the patient portal called "MyChart".  Sign up information is provided on this After Visit Summary.  MyChart is used to connect with patients for Virtual Visits (Telemedicine).  Patients are able to view lab/test results, encounter notes, upcoming appointments, etc.  Non-urgent messages can be sent to your provider as well.   To learn more about what you can do with MyChart, go to [email or web address].    Your next appointment:   1 month(s)  The format for your next appointment:   In Person  Provider:   You may see Nelva Bush, MD or one of the following Advanced Practice Providers on your designated Care Team:   Murray Hodgkins, NP Christell Faith, PA-C Cadence Kathlen Mody, [place]

## 2021-06-20 ENCOUNTER — Telehealth: Payer: Self-pay | Admitting: Internal Medicine

## 2021-06-20 NOTE — Telephone Encounter (Signed)
Patient sister dropped off patient assistance forms to be completed  Placed in nurse box

## 2021-06-21 ENCOUNTER — Telehealth (HOSPITAL_COMMUNITY): Payer: Self-pay | Admitting: *Deleted

## 2021-06-21 NOTE — Telephone Encounter (Signed)
Reaching out to patient to offer assistance regarding upcoming cardiac imaging study; pt verbalizes understanding of appt date/time, parking situation and where to check in, pre-test NPO status and medications ordered, and verified current allergies; name and call back number provided for further questions should they arise  Gordy Clement RN Navigator Cardiac Imaging [facility] [phone] office [phone] cell  Patient to take 100mg  metoprolol tartrate two hours prior to her cardiac CT.

## 2021-06-23 ENCOUNTER — Other Ambulatory Visit: Payer: Self-pay

## 2021-06-23 ENCOUNTER — Ambulatory Visit
Admission: RE | Admit: 2021-06-23 | Discharge: 2021-06-23 | Disposition: A | Discharge status: Home / Self Care | Payer: Medicare Other | Source: Ambulatory Visit | Attending: Internal Medicine | Admitting: Internal Medicine

## 2021-06-23 DIAGNOSIS — I5022 Chronic systolic (congestive) heart failure: Secondary | ICD-10-CM | POA: Insufficient documentation

## 2021-06-23 DIAGNOSIS — I2510 Atherosclerotic heart disease of native coronary artery without angina pectoris: Secondary | ICD-10-CM

## 2021-06-23 DIAGNOSIS — R931 Abnormal findings on diagnostic imaging of heart and coronary circulation: Secondary | ICD-10-CM | POA: Diagnosis present

## 2021-06-23 IMAGING — CT CT HEART MORP W/ CTA COR W/ SCORE W/ CA W/CM &/OR W/O CM
1 of 14 series · 3 of 20 positions shown, 4 images · non-contrast
Comparison: none

Addendum:
CLINICAL DATA: Cardiomyopathy, eval of ischemia

EXAM:
Cardiac/Coronary  CTA
TECHNIQUE: The patient was scanned on a Siemens Somatom go.Top scanner.

[Series 25: multiphase % cta coronary 0.60 · axial · 0.38mm/px · z∈[-1111,-1050]mm · 3 of 3366 slices shown, 4 images]
[im [id number]  vessel]
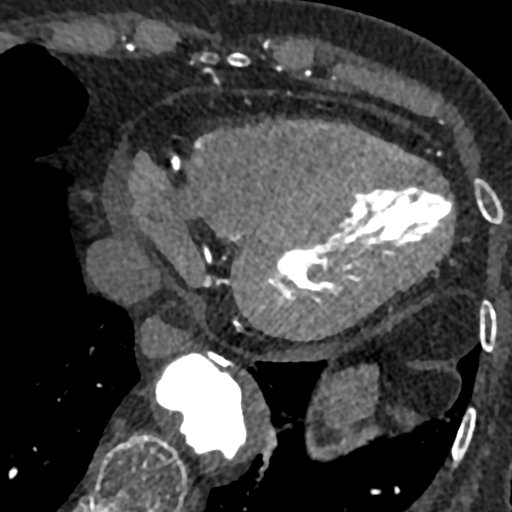
[im 842/3366  lung]
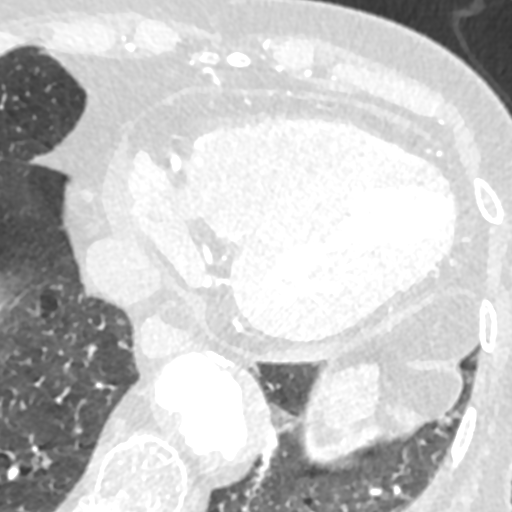
[im 1683/3366  vessel]
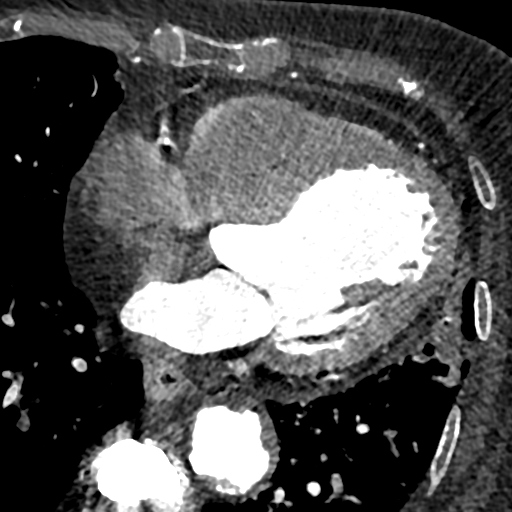
[im 2524/3366  vessel]
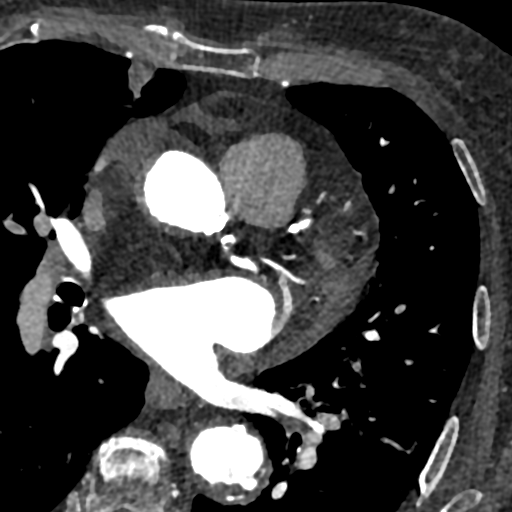

[3 of 20 positions shown; findings below may reference images not displayed]

:
A retrospective scan was triggered in the descending thoracic aorta.
Axial non-contrast 3 mm slices were carried out through the heart.
The data set was analyzed on a dedicated work station and scored
using the Agatson method. Gantry rotation speed was 330 msecs and
collimation was .6 mm. 100mg of metoprolol and 0.8 mg of sl NTG was
given. The 3D data set was reconstructed in 5% intervals of the
60-95 % of the R-R cycle. Diastolic phases were analyzed on a
dedicated work station using MPR, MIP and VRT modes. The patient
received 75 cc of contrast.
FINDINGS: Aorta: Normal size. Calcified and non calcified plaque noted
throughout ascending and descending thoracic aorta. Grade IV-V
atheroma noted in the descending thoracic aorta. No dissection.

Aortic Valve:  Trileaflet.  No calcifications.

Coronary Arteries:  Normal coronary origin.  Right dominance.

RCA is a dominant artery that gives rise to PDA and PLA. There is
non-calcified plaque causing severe stenosis in the mid RCA (>70%).

Left main is a large artery that gives rise to LAD and LCX arteries.

LAD is a large vessel that has calcified plaque causing mild to
moderate proximal LAD disease (25-49%).

LCX is a non-dominant artery that gives rise to one large OM1
branch. There is calcified plaque causing mild proximal LCx disease
(25%).

Other findings:

Normal pulmonary vein drainage into the left atrium.

Normal left atrial appendage without a thrombus.

Normal size of the pulmonary artery.
IMPRESSION: 1. Coronary calcium score of 612. This was 82nd percentile for age
and sex matched control.

2. Normal coronary origin with right dominance.

3. Severe mid RCA stenosis.

4. Mild to moderate non obstructive proximal LAD disease, mild
proximal LCx disease.

5. Grade IV-V descending thoracic aorta atheroma.

6. CAD-RADS 4 Severe stenosis. (70-99% or > 50% left main). Cardiac
catheterization is recommended.

Additional analysis with CT FFR will be submitted and reported
separately.

ADDENDUM:
OVER-READ INTERPRETATION  CT CHEST

The following report is an over-read performed by radiologist Dr.
TIGER [REDACTED] on Creation date. This over-read
does not include interpretation of cardiac or coronary anatomy or
pathology. The CTA interpretation by the cardiologist is attached.

Comparison is made to prior study [DATE]. Status post
kyphoplasty involving 2 lower thoracic vertebral bodies. Stable
platelike atelectasis or consolidation is noted anteriorly in right
upper lobe. Underlying pulmonary nodule cannot be excluded. Small
focus of left inferior lingular subsegmental atelectasis or scarring
is noted.

4.1 cm descending thoracic aortic aneurysm. Recommend annual imaging
followup by CTA or MRA. This recommendation follows [XM]
ACCF/AHA/AATS/ACR/ASA/SCA/TIGER/TIGER/TIGER/TIGER Guidelines for the
Diagnosis and Management of Patients with Thoracic Aortic Disease.
Circulation. [XM]; 121: [id number]. Aortic aneurysm NOS
([XM]-[XM]).

*** End of Addendum ***
:
A retrospective scan was triggered in the descending thoracic aorta.
Axial non-contrast 3 mm slices were carried out through the heart.
The data set was analyzed on a dedicated work station and scored
using the Agatson method. Gantry rotation speed was 330 msecs and
collimation was .6 mm. 100mg of metoprolol and 0.8 mg of sl NTG was
given. The 3D data set was reconstructed in 5% intervals of the
60-95 % of the R-R cycle. Diastolic phases were analyzed on a
dedicated work station using MPR, MIP and VRT modes. The patient
received 75 cc of contrast.
FINDINGS: Aorta: Normal size. Calcified and non calcified plaque noted
throughout ascending and descending thoracic aorta. Grade IV-V
atheroma noted in the descending thoracic aorta. No dissection.

Aortic Valve:  Trileaflet.  No calcifications.

Coronary Arteries:  Normal coronary origin.  Right dominance.

RCA is a dominant artery that gives rise to PDA and PLA. There is
non-calcified plaque causing severe stenosis in the mid RCA (>70%).

Left main is a large artery that gives rise to LAD and LCX arteries.

LAD is a large vessel that has calcified plaque causing mild to
moderate proximal LAD disease (25-49%).

LCX is a non-dominant artery that gives rise to one large OM1
branch. There is calcified plaque causing mild proximal LCx disease
(25%).

Other findings:

Normal pulmonary vein drainage into the left atrium.

Normal left atrial appendage without a thrombus.

Normal size of the pulmonary artery.
IMPRESSION: 1. Coronary calcium score of 612. This was 82nd percentile for age
and sex matched control.

2. Normal coronary origin with right dominance.

3. Severe mid RCA stenosis.

4. Mild to moderate non obstructive proximal LAD disease, mild
proximal LCx disease.

5. Grade IV-V descending thoracic aorta atheroma.

6. CAD-RADS 4 Severe stenosis. (70-99% or > 50% left main). Cardiac
catheterization is recommended.

Additional analysis with CT FFR will be submitted and reported
separately.

## 2021-06-23 MED ORDER — IOHEXOL 350 MG/ML SOLN
75.0000 mL | Freq: Once | INTRAVENOUS | Status: AC | PRN
  Administered 2021-06-23: 75 mL via INTRAVENOUS

## 2021-06-23 MED ORDER — METOPROLOL TARTRATE 5 MG/5ML IV SOLN
10.0000 mg | Freq: Once | INTRAVENOUS | Status: AC
  Administered 2021-06-23: 10 mg via INTRAVENOUS

## 2021-06-23 MED ORDER — NITROGLYCERIN 0.4 MG SL SUBL
0.8000 mg | SUBLINGUAL_TABLET | Freq: Once | SUBLINGUAL | Status: AC
  Administered 2021-06-23: 0.8 mg via SUBLINGUAL

## 2021-06-23 NOTE — Telephone Encounter (Signed)
MD portion completed and signed.  Faxed completed PAF form for Farxiga to [employer] @ [phone].  Confirmation received.  Paperwork placed in [employer].

## 2021-06-23 NOTE — Progress Notes (Signed)
Patient tolerated CT well. Drank coffee after. Vital signs stable encourage to drink water throughout day.Reasons explained and verbalized understanding. Ambulated steady with walker.

## 2021-06-24 ENCOUNTER — Telehealth: Payer: Self-pay | Admitting: Internal Medicine

## 2021-06-24 ENCOUNTER — Other Ambulatory Visit: Payer: Self-pay | Admitting: Internal Medicine

## 2021-06-24 DIAGNOSIS — I5022 Chronic systolic (congestive) heart failure: Secondary | ICD-10-CM

## 2021-06-24 DIAGNOSIS — R931 Abnormal findings on diagnostic imaging of heart and coronary circulation: Secondary | ICD-10-CM | POA: Diagnosis not present

## 2021-06-24 DIAGNOSIS — Z79899 Other long term (current) drug therapy: Secondary | ICD-10-CM

## 2021-06-24 MED ORDER — FUROSEMIDE 20 MG PO TABS: 40.0000 mg | ORAL_TABLET | Freq: Every day | ORAL | 1 refills | Status: DC

## 2021-06-24 NOTE — Telephone Encounter (Signed)
Called pt and made aware of Dr. Darnelle Bos recc.  Pt voiced understanding.  Pt will incr Lasix to 40 mg daily. She will take 2 tablets of Lasix 20 mg daily that she has on hand.  Pt scheduled for BMET Monday 06/27/21 in our office.

## 2021-06-24 NOTE — Telephone Encounter (Signed)
Called and spoke with pt.  Pt c/o continued swelling in lower legs, left leg > right, unchanged from prior assessment.  Pt weighs daily, reports wt has been 140 daily but this morning 143 lb.  Pt denies shortness of breath. Denies additional sodium. Pt is keeping legs elevated when sitting.  Pt continues Lasix 20 mg daily - started at last office visit 06/08/21.   Pt states she has not noticed incr in urine output after starting Lasix.  She is not sure if it is helping and wanted to make Dr. Saunders Revel aware.  Advised pt to continue taking as prescribed, and I will make Dr. Saunders Revel aware in the meantime.  Pt has no further questions or concerns at this time.

## 2021-06-24 NOTE — Telephone Encounter (Signed)
Thank you for the update. I recommend increasing furosemide to 40 mg daily and checking a BMP early next week. Prelim results of coronary CTA have been reviewed. We are awaiting additional analysis of the images (CT FFR) before final results are available.  Chris Jalayla Chrismer

## 2021-06-24 NOTE — Telephone Encounter (Signed)
Pt c/o swelling: STAT is pt has developed SOB within 24 hours  If swelling, where is the swelling located? In feet/ankles  How much weight have you gained and in what time span? Maybe a pound or two  Have you gained 3 pounds in a day or 5 pounds in a week? no  Do you have a log of your daily weights (if so, list)? no  Are you currently taking a fluid pill? Yes - does not think lasix is working   Are you currently SOB? no  Have you traveled recently? no

## 2021-06-27 ENCOUNTER — Other Ambulatory Visit: Payer: Self-pay

## 2021-06-27 ENCOUNTER — Other Ambulatory Visit (INDEPENDENT_AMBULATORY_CARE_PROVIDER_SITE_OTHER): Payer: Medicare Other

## 2021-06-27 DIAGNOSIS — Z79899 Other long term (current) drug therapy: Secondary | ICD-10-CM

## 2021-06-27 DIAGNOSIS — I5022 Chronic systolic (congestive) heart failure: Secondary | ICD-10-CM | POA: Diagnosis not present

## 2021-06-27 NOTE — Telephone Encounter (Signed)
Pt in today for lab work. Pt asked to see this RN regarding LE edema.   Pt confirms that she incr Lasix to 40 mg daily per recc given on 2/3.  Pt started incr dose on Saturday 2/4. Pt did not take AM meds today prior to lab visit.  So pt has had 2 full days of incr dose of Lasix 40 mg.   Assessed pt's BLE. Left ankle/foot appears to still be 1-2+ edema and right ankle/foot trace edema.  Skin dry and intact.   Per last ov note this is the same per assessment in office of lower extremities.  Pt does report incr urine output after starting incr dose on Saturday.  Advised pt continue current dose and we will call her with lab results.  Advised that she call us tomorrow afternoon (which will be 4 days of incr Lasix dose) with update.   Pt voiced understanding and will call us tomorrow with update.

## 2021-06-28 LAB — BASIC METABOLIC PANEL
BUN/Creatinine Ratio: 11 — ABNORMAL LOW (ref 12–28)
BUN: 9 mg/dL (ref 8–27)
CO2: 20 mmol/L (ref 20–29)
Calcium: 8.9 mg/dL (ref 8.7–10.3)
Chloride: 100 mmol/L (ref 96–106)
Creatinine, Ser: 0.83 mg/dL (ref 0.57–1.00)
Glucose: 114 mg/dL — ABNORMAL HIGH (ref 70–99)
Potassium: 4.3 mmol/L (ref 3.5–5.2)
Sodium: 141 mmol/L (ref 134–144)
eGFR: 71 mL/min/{1.73_m2} (ref >59)

## 2021-06-29 NOTE — Telephone Encounter (Signed)
Spoke with pt for follow up of incr Lasix.  Pt continues lasix 40 mg daily.  Reports some improvement of bilateral LE edema.  Left remains greater than right, with improvement of swelling.  Reports continued incr urine output after taking lasix in am.  Weight decr 1 lb since we last spoke on 2/6.  Today's wt (2/8) 142 lb.   Pt has received lab results.  Pt will continue medications as she is taking and will follow up as scheduled with Dr. Saunders Revel 07/13/21.  Pt aware to let us know of any changes in the meantime.

## 2021-06-29 NOTE — Telephone Encounter (Signed)
Notified pt that [facility] pt assistance has been approved for 06/28/21 - 05/21/22.  Medication will be mailed to pt.   Paperwork placed in [employer].

## 2021-06-29 NOTE — Telephone Encounter (Signed)
Patient returning call as advised

## 2021-07-13 ENCOUNTER — Encounter: Payer: Self-pay | Admitting: Internal Medicine

## 2021-07-13 ENCOUNTER — Other Ambulatory Visit: Payer: Self-pay

## 2021-07-13 ENCOUNTER — Ambulatory Visit (INDEPENDENT_AMBULATORY_CARE_PROVIDER_SITE_OTHER): Payer: Medicare Other | Admitting: Internal Medicine

## 2021-07-13 VITALS — BP 120/70 | HR 89 | Wt 141.0 lb

## 2021-07-13 DIAGNOSIS — I5022 Chronic systolic (congestive) heart failure: Secondary | ICD-10-CM

## 2021-07-13 DIAGNOSIS — I2510 Atherosclerotic heart disease of native coronary artery without angina pectoris: Secondary | ICD-10-CM | POA: Insufficient documentation

## 2021-07-13 DIAGNOSIS — E1169 Type 2 diabetes mellitus with other specified complication: Secondary | ICD-10-CM | POA: Diagnosis not present

## 2021-07-13 DIAGNOSIS — E785 Hyperlipidemia, unspecified: Secondary | ICD-10-CM

## 2021-07-13 DIAGNOSIS — Z8673 Personal history of transient ischemic attack (TIA), and cerebral infarction without residual deficits: Secondary | ICD-10-CM | POA: Diagnosis not present

## 2021-07-13 DIAGNOSIS — I700 Atherosclerosis of aorta: Secondary | ICD-10-CM

## 2021-07-13 MED ORDER — CARVEDILOL 6.25 MG PO TABS: 6.2500 mg | ORAL_TABLET | Freq: Two times a day (BID) | ORAL | 0 refills | Status: DC

## 2021-07-13 MED ORDER — FUROSEMIDE 40 MG PO TABS: 40.0000 mg | ORAL_TABLET | Freq: Every day | ORAL | 0 refills | Status: DC

## 2021-07-13 NOTE — Progress Notes (Signed)
Follow-up Outpatient Visit Date: 07/13/2021  Primary Care Provider: Juluis Pitch, MD [address] [address]  Chief Complaint: Follow-up HFrEF, CAD, and CVA  HPI:  Marissa Fletcher is a 81 y.o. female with history of stroke with extensive aortic and carotid atherosclerosis (04/2021), cardiomyopathy (LVEF 35-40% by echo in 04/2021), hypertension, hyperlipidemia, diabetes mellitus, lung cancer, tobacco abuse, and anxiety, who presents for follow-up of cardiomyopathy and stroke.  I last saw Ms. Wilmott on 06/08/2021, at which time she reported improving strength in her left side.  She denied chest pain and shortness of breath but noted some orthostatic lightheadedness.  We agreed to start furosemide 20 mg daily for mild leg edema.  We performed a coronary CTA to assess for underlying ischemic heart disease; this demonstrated severe single-vessel coronary artery disease with a greater than 70% stenosis in the mid RCA that was significant by CT FFR.  Today, Ms. Talwar reports that she is feeling fairly well.  Her main complaint is of pain in her feet that she attributes to neuropathy.  He responds somewhat to gabapentin, though she is only taking this once a day.  She also notes some continued swelling in her legs.  She is compliant with her medications, though she has yet to receive dapagliflozin after being granted pharmacy assistance.  She has not had any chest pain, shortness of breath, palpitations, or lightheadedness.  She does not take her blood pressure regularly at home but notes that it was 110/59 yesterday when checked by her occupational therapist.  She has now completed occupational and physical therapy but plans to continue doing exercises at home.  --------------------------------------------------------------------------------------------------  Past Medical History:  Diagnosis Date   Anxiety    Aortic atherosclerosis ([place])    Arthritis    Cardiomyopathy ([place])    a.  04/2021 Echo: EF 35-40%, glob HK. GrII DD.  Nl RV size/fxn. Mod dil LA. Mild MR.   Carotid arterial disease ([place])    a. 04/2021 CTA Head/neck: RICA 60, LICA 55.   COPD (chronic obstructive pulmonary disease) ([facility])    Depression    Diabetes mellitus without complication ([place])    History of kidney stones    Hypertension    Lung cancer ([place])    Stroke (cerebrum) ([place])    a. 04/2021 MRI brain: Acute infarct post limb of R internal capsule.   Thoracic aortic aneurysm    Tobacco abuse    Past Surgical History:  Procedure Laterality Date   ABDOMINAL HYSTERECTOMY     CHOLECYSTECTOMY     EYE SURGERY Bilateral    KYPHOPLASTY N/A 07/30/2020   Procedure: T10 Kyphoplasty;  Surgeon: Hessie Knows, MD;  Location: [facility] ORS;  Service: Orthopedics;  Laterality: N/A;  T10   KYPHOPLASTY N/A 08/19/2020   Procedure: T9 YPHOPLASTY;  Surgeon: Hessie Knows, MD;  Location: [facility] ORS;  Service: Orthopedics;  Laterality: N/A;    Current Meds  Medication Sig   acetaminophen (TYLENOL) 500 MG tablet Take 2 tablets (1,000 mg total) by mouth 3 (three) times daily as needed for mild pain, moderate pain or headache.   ALPRAZolam (XANAX) 0.25 MG tablet Take 0.25 mg by mouth daily as needed for anxiety.   aspirin EC 81 MG EC tablet Take 1 tablet (81 mg total) by mouth daily. Swallow whole.   atorvastatin (LIPITOR) 20 MG tablet Take 20 mg by mouth every Monday, Wednesday, and Friday.   carvedilol (COREG) 3.125 MG tablet Take 1 tablet (3.125 mg total) by mouth 2 (two)  times daily with a meal.   clopidogrel (PLAVIX) 75 MG tablet Take 1 tablet (75 mg total) by mouth daily.   doxepin (SINEQUAN) 25 MG capsule Take 50-75 mg by mouth at bedtime.   furosemide (LASIX) 20 MG tablet Take 2 tablets (40 mg total) by mouth daily.   gabapentin (NEURONTIN) 300 MG capsule Take 300 mg by mouth at bedtime.   losartan (COZAAR) 25 MG tablet Take 0.5 tablets (12.5 mg total) by mouth daily.   metFORMIN (GLUCOPHAGE-XR) 500 MG 24 hr tablet  Take 500 mg by mouth 2 (two) times daily.   PARoxetine (PAXIL) 30 MG tablet Take 30 mg by mouth every morning.   polyethylene glycol (MIRALAX / GLYCOLAX) 17 g packet Take 17 g by mouth daily as needed for moderate constipation.    Allergies: Sulfa antibiotics  Social History   Tobacco Use   Smoking status: Every Day    Packs/day: 0.50    Years: 68.00    Pack years: 34.00    Types: Cigarettes   Smokeless tobacco: Never  Vaping Use   Vaping Use: Never used  Substance Use Topics   Alcohol use: Never   Drug use: Never    Family History  Problem Relation Age of Onset   Dementia Mother    Congestive Heart Failure Mother    Bladder Cancer Father    Heart disease Father    Breast cancer Neg Hx     Review of Systems: A 12-system review of systems was performed and was negative except as noted in the HPI.  --------------------------------------------------------------------------------------------------  Physical Exam: BP 120/70 (BP Location: Left Arm, Patient Position: Sitting, Cuff Size: Normal)    Pulse 89    Wt 141 lb (64 kg)    BMI 25.79 kg/m   General:  NAD. Neck: No JVD or HJR. Lungs: Clear to auscultation bilaterally without wheezes or crackles. Heart: Regular rate and rhythm without murmurs, rubs, or gallops. Abdomen: Soft, nontender, nondistended. Extremities: 1+ left and trace right pretibial edema.  Lab Results  Component Value Date   WBC 6.7 05/20/2021   HGB 8.5 (L) 05/20/2021   HCT 28.3 (L) 05/20/2021   MCV 79.9 (L) 05/20/2021   PLT 323 05/20/2021    Lab Results  Component Value Date   NA 141 06/27/2021   K 4.3 06/27/2021   CL 100 06/27/2021   CO2 20 06/27/2021   BUN 9 06/27/2021   CREATININE 0.83 06/27/2021   GLUCOSE 114 (H) 06/27/2021   ALT 12 05/17/2021    Lab Results  Component Value Date   CHOL 147 05/17/2021   HDL 38 (L) 05/17/2021   LDLCALC 96 05/17/2021   TRIG 65 05/17/2021   CHOLHDL 3.9 05/17/2021     --------------------------------------------------------------------------------------------------  ASSESSMENT AND PLAN: Chronic HFrEF: Ms. Guastella has asymmetric lower extremity edema that has been present dating back to her hospitalization in 04/2021.  It has improved since she was discharged.  We will plan to continue furosemide 40 mg daily.  I asked her to take an additional dose if she notices worsening swelling or gains 2 pounds in 24 hours or 5 pounds in 1 week.  Given her reasonable blood pressure today, we will increase carvedilol to 6.25 mg twice daily.  Hopefully, she will also receive her supply of dapagliflozin 10 mg daily in the next few days.  We will continue low-dose losartan.  If her blood pressure allows, we can consider switching to [facility] in the future.  Defer adding aldosterone antagonist at this  time given aforementioned medication changes.  Coronary artery disease: Ms. Capwell does not report any angina.  I do not think that her significant single-vessel coronary artery disease entirely explains her reduced LVEF.  Given her history of stroke in 04/2021 and extensive atherosclerotic plaque throughout the aorta, she would be high risk for periprocedural stroke or other embolic phenomenon during catheterization.  We will therefore defer catheterization at this time and focus on aggressive medical therapy.  Hyperlipidemia associated with type 2 diabetes mellitus: Continue atorvastatin 20 mg 3 days a week.  We will plan to recheck a lipid panel when Ms. Vanzile returns in a month.  If LDL is above 70, would need to consider increasing dose/frequency of atorvastatin, as tolerated.  Ongoing management of diabetes mellitus per Dr. Lovie Macadamia.  History of stroke: No new focal neurologic deficits.  Continue aggressive secondary prevention including DAPT, blood pressure control, and lipid therapy for target LDL less than 70.  Aortic atherosclerosis: Continue aggressive medical  therapy as well as risk factor modification to include smoking cessation.  Follow-up: Return to clinic in 1 month.  Nelva Bush, MD 07/13/2021 3:11 PM

## 2021-07-13 NOTE — Patient Instructions (Signed)
Medication Instructions:   Your physician has recommended you make the following change in your medication:   INCREASE Carvedilol (Coreg) 6.25 mg TWICE daily   Refills have been sent for Lasix 40 mg daily  *If you need a refill on your cardiac medications before your next appointment, please call your pharmacy*   Lab Work:  None ordered  Testing/Procedures:  None ordered   Follow-Up: At [facility], you and your health needs are our priority.  As part of our continuing mission to provide you with exceptional heart care, we have created designated Provider Care Teams.  These Care Teams include your primary Cardiologist (physician) and Advanced Practice Providers (APPs -  Physician Assistants and Nurse Practitioners) who all work together to provide you with the care you need, when you need it.  We recommend signing up for the patient portal called "MyChart".  Sign up information is provided on this After Visit Summary.  MyChart is used to connect with patients for Virtual Visits (Telemedicine).  Patients are able to view lab/test results, encounter notes, upcoming appointments, etc.  Non-urgent messages can be sent to your provider as well.   To learn more about what you can do with MyChart, go to [email or web address].    Your next appointment:   1 month(s)  The format for your next appointment:   In Person  Provider:   You may see Nelva Bush, MD or one of the following Advanced Practice Providers on your designated Care Team:   Murray Hodgkins, NP Christell Faith, PA-C Cadence Kathlen Mody, [place]

## 2021-07-14 NOTE — Addendum Note (Signed)
Encounter addended by: Jeannette How on: 07/14/2021 11:22 AM  Actions taken: Imaging Exam ended

## 2021-07-14 NOTE — Addendum Note (Signed)
Encounter addended by: Jeannette How on: 07/14/2021 10:57 AM  Actions taken: Imaging Exam begun

## 2021-07-19 NOTE — Telephone Encounter (Signed)
Received a fax from [employer] stating patient's medication has been shipped to her home address on file.   Allow 1-2 business days for shipment to arrive.  2. Check status of your patient's shipment or request refill via 24/7 via self-service web www.https://tate.info/ OR 24/7 self -service IVR 1-800-[facility] ([phone] 0  If you have any questions, please contact us at [phone] between 9:00 AM -6:00 PM ET excluding holidays.   Sincerely, [employer] [facility] [employer] [facility]   Demetra Moya PEP_[id number]  Letter was filed in filing cabinet where medication assistance forms are kept.

## 2021-08-09 ENCOUNTER — Ambulatory Visit: Admission: RE | Admit: 2021-08-09 | Payer: Medicare Other | Source: Ambulatory Visit

## 2021-08-11 ENCOUNTER — Ambulatory Visit: Payer: Medicare Other | Admitting: Medical

## 2021-08-11 NOTE — Progress Notes (Deleted)
?Cardiology Office Note:   ? ?Date:  08/11/2021  ? ?ID:  Marissa Fletcher, DOB 1941/02/19, MRN [id number] ? ?PCP:  Juluis Pitch, MD  ?[facility] Cardiologist:  Nelva Bush, MD  ?[facility] Electrophysiologist:  None  ? ?Referring MD: Juluis Pitch, MD  ? ?Chief Complaint: 1 month follow-up ? ?History of Present Illness:   ? ?Marissa Fletcher is a 81 y.o. female with a hx of with history of stroke with extensive aortic and carotid atherosclerosis (04/2021), cardiomyopathy (LVEF 35-40% by echo in 04/2021), hypertension, hyperlipidemia, diabetes mellitus, lung cancer, tobacco abuse, and anxiety, who presents for follow-up of cardiomyopathy and stroke. ? ?06/08/21 the patient had mild LLE and was started on lasix daily. Coronary CTA showed severe single vessel CAD with >79% stenosis in the mRCA that was significant by CT FFR.  ? ?Last seen 07/13/21 and was feeling faily well. Coreg was increased for BP control. CT was reviewed, it was felt single vessel CAD does not explain reduced LVEF. Cath was deferred due to high risk of periprocedural stroke or other embolic phenomenon.  ? ?Today  ?Entresto ? ?Past Medical History:  ?Diagnosis Date  ? Anxiety   ? Aortic atherosclerosis ([place])   ? Arthritis   ? Cardiomyopathy ([place])   ? a. 04/2021 Echo: EF 35-40%, glob HK. GrII DD.  Nl RV size/fxn. Mod dil LA. Mild MR.  ? Carotid arterial disease ([place])   ? a. 04/2021 CTA Head/neck: RICA 60, LICA 55.  ? COPD (chronic obstructive pulmonary disease) ([place])   ? Depression   ? Diabetes mellitus without complication ([place])   ? History of kidney stones   ? Hypertension   ? Lung cancer ([place])   ? Stroke (cerebrum) ([place])   ? a. 04/2021 MRI brain: Acute infarct post limb of R internal capsule.  ? Thoracic aortic aneurysm   ? Tobacco abuse   ? ? ?Past Surgical History:  ?Procedure Laterality Date  ? ABDOMINAL HYSTERECTOMY    ? CHOLECYSTECTOMY    ? EYE SURGERY Bilateral   ? KYPHOPLASTY N/A 07/30/2020  ? Procedure: T10 Kyphoplasty;   Surgeon: Hessie Knows, MD;  Location: [facility] ORS;  Service: Orthopedics;  Laterality: N/A;  T10  ? KYPHOPLASTY N/A 08/19/2020  ? Procedure: T9 YPHOPLASTY;  Surgeon: Hessie Knows, MD;  Location: [facility] ORS;  Service: Orthopedics;  Laterality: N/A;  ? ? ?Current Medications: ?No outpatient medications have been marked as taking for the 08/11/21 encounter (Appointment) with Kathlen Mody, Teran Knittle H, PA-C.  ?  ? ?Allergies:   Sulfa antibiotics  ? ?Social History  ? ?Socioeconomic History  ? Marital status: Single  ?  Spouse name: Not on file  ? Number of children: Not on file  ? Years of education: Not on file  ? Highest education level: Not on file  ?Occupational History  ? Not on file  ?Tobacco Use  ? Smoking status: Every Day  ?  Packs/day: 0.50  ?  Years: 68.00  ?  Pack years: 34.00  ?  Types: Cigarettes  ? Smokeless tobacco: Never  ?Vaping Use  ? Vaping Use: Never used  ?Substance and Sexual Activity  ? Alcohol use: Never  ? Drug use: Never  ? Sexual activity: Not on file  ?Other Topics Concern  ? Not on file  ?Social History Narrative  ? Lives locally.  Does not routinely exercise.  ? ?Social Determinants of Health  ? ?Financial Resource Strain: Not on file  ?Food Insecurity: Not on file  ?Transportation Needs: Not  on file  ?Physical Activity: Not on file  ?Stress: Not on file  ?Social Connections: Not on file  ?  ? ?Family History: ?The patient's family history includes Bladder Cancer in her father; Congestive Heart Failure in her mother; Dementia in her mother; Heart disease in her father. There is no history of Breast cancer. ? ?ROS:   ?Please see the history of present illness.    ? All other systems reviewed and are negative. ? ?EKGs/Labs/Other Studies Reviewed:   ? ?The following studies were reviewed today: ? ?Coronary CTA 06/2021 ? ?IMPRESSION: ?1. Coronary calcium score of 612. This was 82nd percentile for age ?and sex matched control. ?  ?2. Normal coronary origin with right dominance. ?  ?3. Severe mid RCA  stenosis. ?  ?4. Mild to moderate non obstructive proximal LAD disease, mild ?proximal LCx disease. ?  ?5. Grade IV-V descending thoracic aorta atheroma. ?  ?6. CAD-RADS 4 Severe stenosis. (70-99% or > 50% left main). Cardiac ?catheterization is recommended. ?  ?Additional analysis with CT FFR will be submitted and reported ?separately. ?  ?Electronically Signed: ?By: Kate Sable M.D. ?On: 06/23/2021 16:23 ? ?FFR ?1. Left Main:  No significant stenosis. ?  ?2. LAD: No significant stenosis.  FFRct 0.86 ?3. LCX: No significant focal stenosis.  FFRct 0.74 ?4. RCA: significant stenosis in the mid RCA.  FFRct 0.59 ?  ?IMPRESSION: ?1.  CT FFR analysis showed significant stenosis in the mid RCA. ?  ?2.  Recommend cardiac catheterization. ?  ?  ?Electronically Signed ?  By: Kate Sable M.D. ?  On: 06/24/2021 17:40 ? ?Echo 04/2021 ? 1. Left ventricular ejection fraction, by estimation, is 35 to 40%. The  ?left ventricle has moderately decreased function. The left ventricle  ?demonstrates global hypokinesis. There is mild left ventricular  ?hypertrophy. Left ventricular diastolic  ?parameters are consistent with Grade II diastolic dysfunction  ?(pseudonormalization).  ? 2. Right ventricular systolic function is normal. The right ventricular  ?size is normal.  ? 3. Left atrial size was mild to moderately dilated.  ? 4. The mitral valve is normal in structure. Mild mitral valve  ?regurgitation.  ? 5. The aortic valve is tricuspid. Aortic valve regurgitation is not  ?visualized.  ? 6. The inferior vena cava is dilated in size with <50% respiratory  ?variability, suggesting right atrial pressure of 15 mmHg.  ? ?EKG:  EKG is *** ordered today.  The ekg ordered today demonstrates *** ? ?Recent Labs: ?05/17/2021: ALT 12; B Natriuretic Peptide 272.7 ?05/20/2021: Hemoglobin 8.5; Magnesium 2.1; Platelets 323 ?06/27/2021: BUN 9; Creatinine, Ser 0.83; Potassium 4.3; Sodium 141  ?Recent Lipid Panel ?   ?Component Value  Date/Time  ? CHOL 147 05/17/2021 0956  ? TRIG 65 05/17/2021 0956  ? HDL 38 (L) 05/17/2021 0956  ? CHOLHDL 3.9 05/17/2021 0956  ? VLDL 13 05/17/2021 0956  ? [place] 96 05/17/2021 0956  ? ? ? ?Risk Assessment/Calculations:   ?{Does this patient have ATRIAL FIBRILLATION?:[phone]} ? ? ?Physical Exam:   ? ?VS:  There were no vitals taken for this visit.   ? ?Wt Readings from Last 3 Encounters:  ?07/13/21 141 lb (64 kg)  ?06/08/21 142 lb (64.4 kg)  ?05/17/21 144 lb 13.5 oz (65.7 kg)  ?  ? ?GEN: *** Well nourished, well developed in no acute distress ?HEENT: Normal ?NECK: No JVD; No carotid bruits ?LYMPHATICS: No lymphadenopathy ?CARDIAC: ***RRR, no murmurs, rubs, gallops ?RESPIRATORY:  Clear to auscultation without rales, wheezing or rhonchi  ?  ABDOMEN: Soft, non-tender, non-distended ?MUSCULOSKELETAL:  No edema; No deformity  ?SKIN: Warm and dry ?NEUROLOGIC:  Alert and oriented x 3 ?PSYCHIATRIC:  Normal affect  ? ?ASSESSMENT:   ? ?No diagnosis found. ?PLAN:   ? ?In order of problems listed above: ? ?HFrEF ? ?CAD ? ?HLD ? ?Aortic atherosclerosis ? ?Disposition: Follow up {follow up:15908} with ***  ? ?Shared Decision Making/Informed Consent   ?{Are you ordering a CV Procedure (e.g. stress test, cath, DCCV, TEE, etc)?   Press F2        :[id number]}  ? ? ?Signed, ?Clif Serio Ninfa Meeker, PA-C  ?08/11/2021 12:30 PM    ?[place]  ?

## 2021-08-14 ENCOUNTER — Observation Stay: Payer: Medicare Other

## 2021-08-14 ENCOUNTER — Encounter: Payer: Self-pay | Admitting: Emergency Medicine

## 2021-08-14 ENCOUNTER — Inpatient Hospital Stay
Admission: EM | Admit: 2021-08-14 | Discharge: 2021-08-18 | DRG: 872 | Disposition: A | Discharge status: Skilled Nursing Facility | Payer: Medicare Other | Attending: Internal Medicine | Admitting: Internal Medicine

## 2021-08-14 ENCOUNTER — Other Ambulatory Visit: Payer: Self-pay

## 2021-08-14 ENCOUNTER — Emergency Department: Payer: Medicare Other

## 2021-08-14 DIAGNOSIS — Z882 Allergy status to sulfonamides status: Secondary | ICD-10-CM

## 2021-08-14 DIAGNOSIS — R251 Tremor, unspecified: Secondary | ICD-10-CM | POA: Diagnosis not present

## 2021-08-14 DIAGNOSIS — D72829 Elevated white blood cell count, unspecified: Secondary | ICD-10-CM

## 2021-08-14 DIAGNOSIS — Z8249 Family history of ischemic heart disease and other diseases of the circulatory system: Secondary | ICD-10-CM

## 2021-08-14 DIAGNOSIS — I5042 Chronic combined systolic (congestive) and diastolic (congestive) heart failure: Secondary | ICD-10-CM | POA: Diagnosis not present

## 2021-08-14 DIAGNOSIS — D509 Iron deficiency anemia, unspecified: Secondary | ICD-10-CM | POA: Diagnosis present

## 2021-08-14 DIAGNOSIS — Z6825 Body mass index (BMI) 25.0-25.9, adult: Secondary | ICD-10-CM

## 2021-08-14 DIAGNOSIS — Z66 Do not resuscitate: Secondary | ICD-10-CM | POA: Diagnosis present

## 2021-08-14 DIAGNOSIS — E11621 Type 2 diabetes mellitus with foot ulcer: Secondary | ICD-10-CM | POA: Diagnosis present

## 2021-08-14 DIAGNOSIS — F17210 Nicotine dependence, cigarettes, uncomplicated: Secondary | ICD-10-CM | POA: Diagnosis present

## 2021-08-14 DIAGNOSIS — I110 Hypertensive heart disease with heart failure: Secondary | ICD-10-CM | POA: Diagnosis present

## 2021-08-14 DIAGNOSIS — I712 Thoracic aortic aneurysm, without rupture, unspecified: Secondary | ICD-10-CM | POA: Diagnosis present

## 2021-08-14 DIAGNOSIS — L97519 Non-pressure chronic ulcer of other part of right foot with unspecified severity: Secondary | ICD-10-CM | POA: Diagnosis present

## 2021-08-14 DIAGNOSIS — E119 Type 2 diabetes mellitus without complications: Secondary | ICD-10-CM

## 2021-08-14 DIAGNOSIS — Z90710 Acquired absence of both cervix and uterus: Secondary | ICD-10-CM

## 2021-08-14 DIAGNOSIS — A419 Sepsis, unspecified organism: Secondary | ICD-10-CM | POA: Diagnosis present

## 2021-08-14 DIAGNOSIS — M1990 Unspecified osteoarthritis, unspecified site: Secondary | ICD-10-CM | POA: Diagnosis present

## 2021-08-14 DIAGNOSIS — Z20822 Contact with and (suspected) exposure to covid-19: Secondary | ICD-10-CM | POA: Diagnosis present

## 2021-08-14 DIAGNOSIS — J449 Chronic obstructive pulmonary disease, unspecified: Secondary | ICD-10-CM | POA: Diagnosis present

## 2021-08-14 DIAGNOSIS — L03116 Cellulitis of left lower limb: Secondary | ICD-10-CM | POA: Diagnosis present

## 2021-08-14 DIAGNOSIS — E785 Hyperlipidemia, unspecified: Secondary | ICD-10-CM | POA: Diagnosis present

## 2021-08-14 DIAGNOSIS — Z87442 Personal history of urinary calculi: Secondary | ICD-10-CM

## 2021-08-14 DIAGNOSIS — E1169 Type 2 diabetes mellitus with other specified complication: Secondary | ICD-10-CM | POA: Diagnosis present

## 2021-08-14 DIAGNOSIS — F419 Anxiety disorder, unspecified: Secondary | ICD-10-CM | POA: Diagnosis present

## 2021-08-14 DIAGNOSIS — Z8052 Family history of malignant neoplasm of bladder: Secondary | ICD-10-CM

## 2021-08-14 DIAGNOSIS — E440 Moderate protein-calorie malnutrition: Secondary | ICD-10-CM | POA: Diagnosis present

## 2021-08-14 DIAGNOSIS — R9431 Abnormal electrocardiogram [ECG] [EKG]: Secondary | ICD-10-CM | POA: Diagnosis present

## 2021-08-14 DIAGNOSIS — Z7984 Long term (current) use of oral hypoglycemic drugs: Secondary | ICD-10-CM

## 2021-08-14 DIAGNOSIS — L97529 Non-pressure chronic ulcer of other part of left foot with unspecified severity: Secondary | ICD-10-CM | POA: Diagnosis present

## 2021-08-14 DIAGNOSIS — I700 Atherosclerosis of aorta: Secondary | ICD-10-CM | POA: Diagnosis present

## 2021-08-14 DIAGNOSIS — I69354 Hemiplegia and hemiparesis following cerebral infarction affecting left non-dominant side: Secondary | ICD-10-CM

## 2021-08-14 DIAGNOSIS — F32A Depression, unspecified: Secondary | ICD-10-CM | POA: Diagnosis present

## 2021-08-14 DIAGNOSIS — Z7902 Long term (current) use of antithrombotics/antiplatelets: Secondary | ICD-10-CM

## 2021-08-14 DIAGNOSIS — I639 Cerebral infarction, unspecified: Secondary | ICD-10-CM

## 2021-08-14 DIAGNOSIS — Z720 Tobacco use: Secondary | ICD-10-CM | POA: Diagnosis present

## 2021-08-14 DIAGNOSIS — Z79899 Other long term (current) drug therapy: Secondary | ICD-10-CM

## 2021-08-14 DIAGNOSIS — E878 Other disorders of electrolyte and fluid balance, not elsewhere classified: Secondary | ICD-10-CM | POA: Diagnosis present

## 2021-08-14 DIAGNOSIS — Z85118 Personal history of other malignant neoplasm of bronchus and lung: Secondary | ICD-10-CM

## 2021-08-14 DIAGNOSIS — I10 Essential (primary) hypertension: Secondary | ICD-10-CM

## 2021-08-14 DIAGNOSIS — F418 Other specified anxiety disorders: Secondary | ICD-10-CM

## 2021-08-14 DIAGNOSIS — B964 Proteus (mirabilis) (morganii) as the cause of diseases classified elsewhere: Secondary | ICD-10-CM | POA: Diagnosis present

## 2021-08-14 DIAGNOSIS — I5022 Chronic systolic (congestive) heart failure: Secondary | ICD-10-CM

## 2021-08-14 DIAGNOSIS — A4159 Other Gram-negative sepsis: Principal | ICD-10-CM | POA: Diagnosis present

## 2021-08-14 DIAGNOSIS — I429 Cardiomyopathy, unspecified: Secondary | ICD-10-CM | POA: Diagnosis present

## 2021-08-14 DIAGNOSIS — Z9049 Acquired absence of other specified parts of digestive tract: Secondary | ICD-10-CM

## 2021-08-14 DIAGNOSIS — E876 Hypokalemia: Secondary | ICD-10-CM | POA: Diagnosis not present

## 2021-08-14 DIAGNOSIS — N390 Urinary tract infection, site not specified: Secondary | ICD-10-CM | POA: Diagnosis present

## 2021-08-14 DIAGNOSIS — Z7982 Long term (current) use of aspirin: Secondary | ICD-10-CM

## 2021-08-14 LAB — BASIC METABOLIC PANEL
Anion gap: 11 (ref 5–15)
BUN: 17 mg/dL (ref 8–23)
CO2: 28 mmol/L (ref 22–32)
Calcium: 7.5 mg/dL — ABNORMAL LOW (ref 8.9–10.3)
Chloride: 100 mmol/L (ref 98–111)
Creatinine, Ser: 0.79 mg/dL (ref 0.44–1.00)
GFR, Estimated: >60 mL/min (ref >60)
Glucose, Bld: 139 mg/dL — ABNORMAL HIGH (ref 70–99)
Potassium: 3.2 mmol/L — ABNORMAL LOW (ref 3.5–5.1)
Sodium: 139 mmol/L (ref 135–145)

## 2021-08-14 LAB — BRAIN NATRIURETIC PEPTIDE: B Natriuretic Peptide: 361.1 pg/mL — ABNORMAL HIGH (ref 0.0–100.0)

## 2021-08-14 LAB — CBC WITH DIFFERENTIAL/PLATELET
Abs Immature Granulocytes: 0.06 10*3/uL (ref 0.00–0.07)
Basophils Absolute: 0.1 10*3/uL (ref 0.0–0.1)
Basophils Relative: 0 %
Eosinophils Absolute: 0.4 10*3/uL (ref 0.0–0.5)
Eosinophils Relative: 3 %
HCT: 33.6 % — ABNORMAL LOW (ref 36.0–46.0)
Hemoglobin: 10.1 g/dL — ABNORMAL LOW (ref 12.0–15.0)
Immature Granulocytes: 0 %
Lymphocytes Relative: 12 %
Lymphs Abs: 1.6 10*3/uL (ref 0.7–4.0)
MCH: 23.1 pg — ABNORMAL LOW (ref 26.0–34.0)
MCHC: 30.1 g/dL (ref 30.0–36.0)
MCV: 76.7 fL — ABNORMAL LOW (ref 80.0–100.0)
Monocytes Absolute: 0.6 10*3/uL (ref 0.1–1.0)
Monocytes Relative: 5 %
Neutro Abs: 10.9 10*3/uL — ABNORMAL HIGH (ref 1.7–7.7)
Neutrophils Relative %: 80 %
Platelets: 504 10*3/uL — ABNORMAL HIGH (ref 150–400)
RBC: 4.38 MIL/uL (ref 3.87–5.11)
RDW: 19.2 % — ABNORMAL HIGH (ref 11.5–15.5)
WBC: 13.6 10*3/uL — ABNORMAL HIGH (ref 4.0–10.5)
nRBC: 0 % (ref 0.0–0.2)

## 2021-08-14 LAB — URINALYSIS, ROUTINE W REFLEX MICROSCOPIC
Bilirubin Urine: NEGATIVE
Glucose, UA: NEGATIVE mg/dL
Ketones, ur: NEGATIVE mg/dL
Nitrite: POSITIVE — AB
Protein, ur: 30 mg/dL — AB
Specific Gravity, Urine: 1.011 (ref 1.005–1.030)
WBC, UA: >50 WBC/hpf — ABNORMAL HIGH (ref 0–5)
pH: 8 (ref 5.0–8.0)

## 2021-08-14 LAB — GLUCOSE, CAPILLARY
Glucose-Capillary: 118 mg/dL — ABNORMAL HIGH (ref 70–99)
Glucose-Capillary: 125 mg/dL — ABNORMAL HIGH (ref 70–99)

## 2021-08-14 LAB — COMPREHENSIVE METABOLIC PANEL
ALT: 16 U/L (ref 0–44)
AST: 26 U/L (ref 15–41)
Albumin: 3.2 g/dL — ABNORMAL LOW (ref 3.5–5.0)
Alkaline Phosphatase: 147 U/L — ABNORMAL HIGH (ref 38–126)
Anion gap: 17 — ABNORMAL HIGH (ref 5–15)
BUN: 18 mg/dL (ref 8–23)
CO2: 26 mmol/L (ref 22–32)
Calcium: 6.3 mg/dL — CL (ref 8.9–10.3)
Chloride: 97 mmol/L — ABNORMAL LOW (ref 98–111)
Creatinine, Ser: 0.88 mg/dL (ref 0.44–1.00)
GFR, Estimated: >60 mL/min (ref >60)
Glucose, Bld: 121 mg/dL — ABNORMAL HIGH (ref 70–99)
Potassium: 3.7 mmol/L (ref 3.5–5.1)
Sodium: 140 mmol/L (ref 135–145)
Total Bilirubin: 0.8 mg/dL (ref 0.3–1.2)
Total Protein: 7.1 g/dL (ref 6.5–8.1)

## 2021-08-14 LAB — MAGNESIUM
Magnesium: <0.5 mg/dL — CL (ref 1.7–2.4)
Magnesium: 2.1 mg/dL (ref 1.7–2.4)

## 2021-08-14 LAB — SEDIMENTATION RATE: Sed Rate: 37 mm/hr — ABNORMAL HIGH (ref 0–30)

## 2021-08-14 LAB — C-REACTIVE PROTEIN: CRP: 5.5 mg/dL — ABNORMAL HIGH (ref <1.0)

## 2021-08-14 LAB — PHOSPHORUS: Phosphorus: 4.8 mg/dL — ABNORMAL HIGH (ref 2.5–4.6)

## 2021-08-14 LAB — LACTIC ACID, PLASMA
Lactic Acid, Venous: 1 mmol/L (ref 0.5–1.9)
Lactic Acid, Venous: 1.2 mmol/L (ref 0.5–1.9)

## 2021-08-14 LAB — AMMONIA: Ammonia: 22 umol/L (ref 9–35)

## 2021-08-14 LAB — CBG MONITORING, ED: Glucose-Capillary: 133 mg/dL — ABNORMAL HIGH (ref 70–99)

## 2021-08-14 LAB — PROCALCITONIN: Procalcitonin: <0.1 ng/mL

## 2021-08-14 IMAGING — CT CT HEAD W/O CM
4 series · 16 of 47 positions shown, 18 images · non-contrast
Comparison: [DATE]

CLINICAL DATA: Left-sided weakness, tremor



[Series 2: head bone · axial · 0.41mm/px · z∈[-102,-70]mm · 3 of 83 slices shown]
[im 9/83  bone]
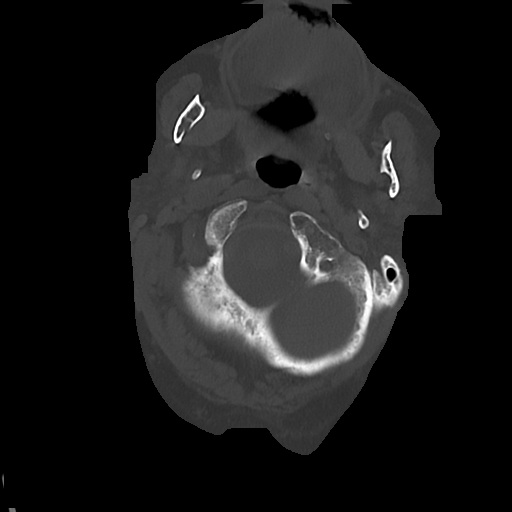
[im 17/83  bone]
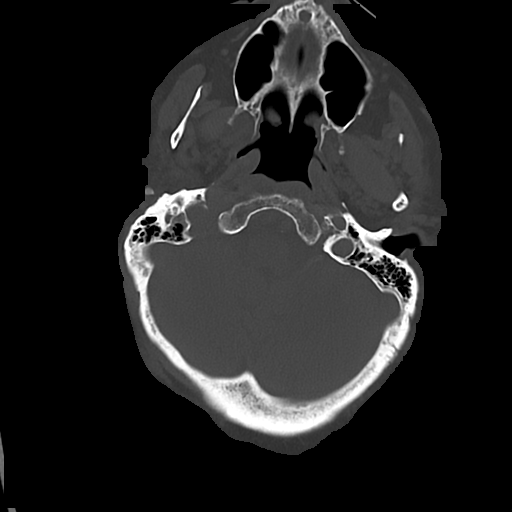
[im 25/83  bone]
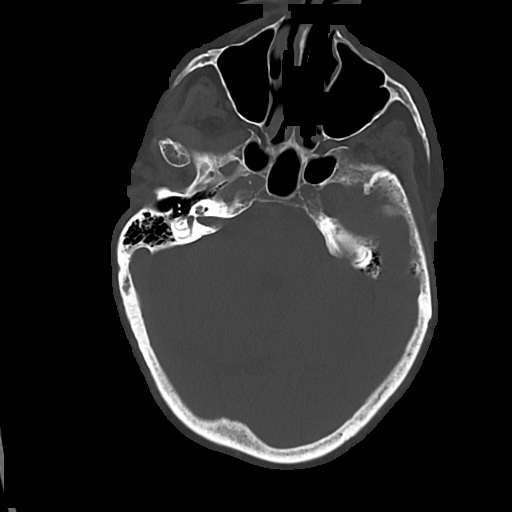

[Series 3: head wo · axial · 0.41mm/px · z∈[-98,+22]mm · 7 of 34 slices shown, 9 images]
[im 5/34  brain]
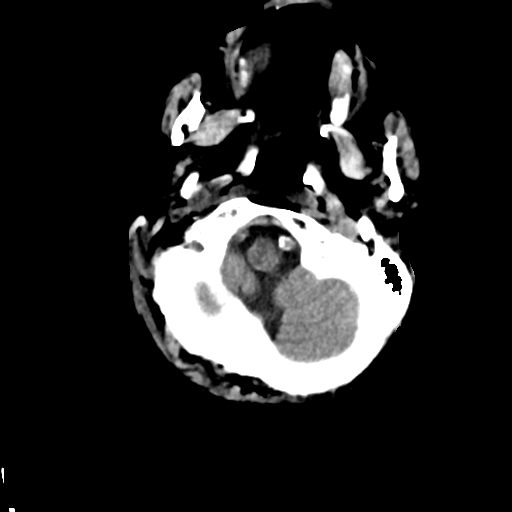
[im 5/34  bone]
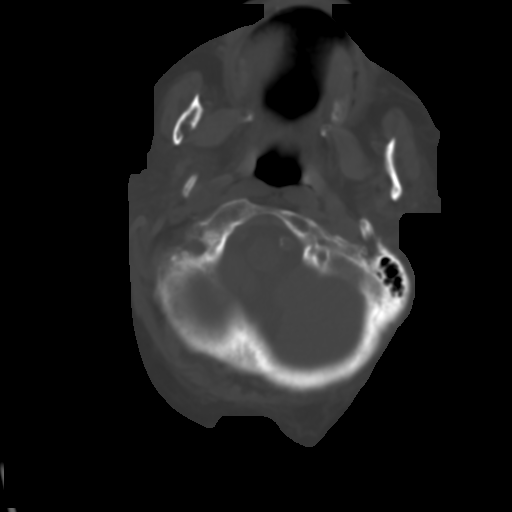
[im 9/34  brain]
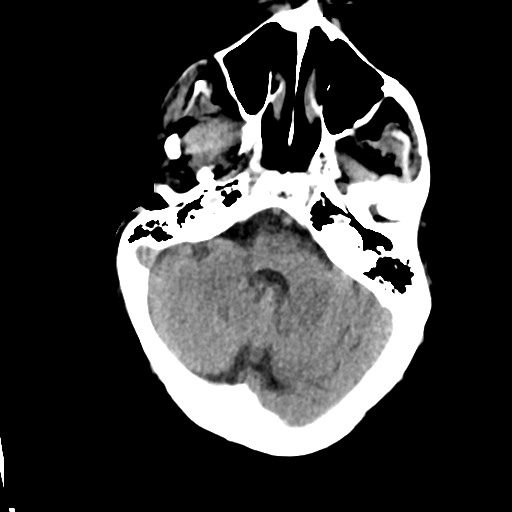
[im 13/34  brain]
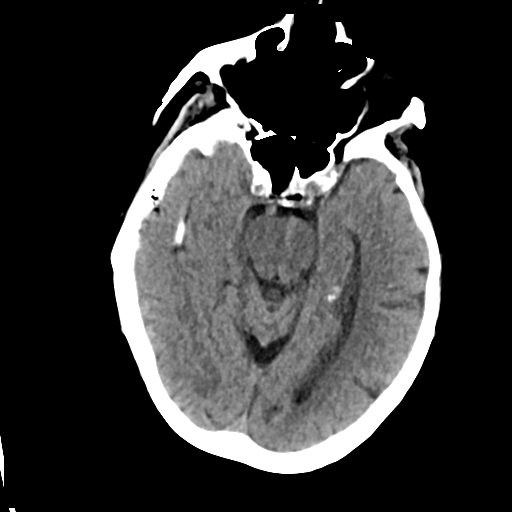
[im 17/34  brain]
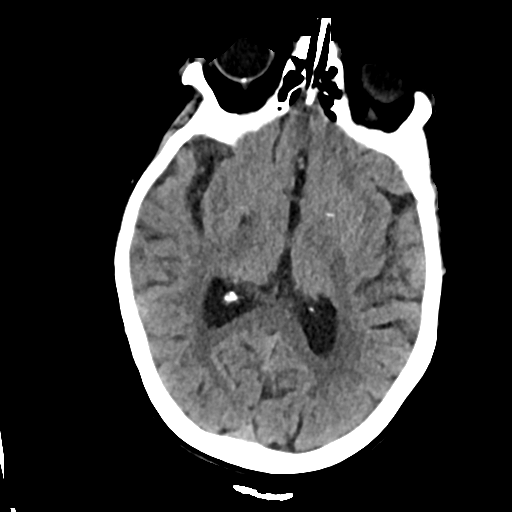
[im 21/34  brain]
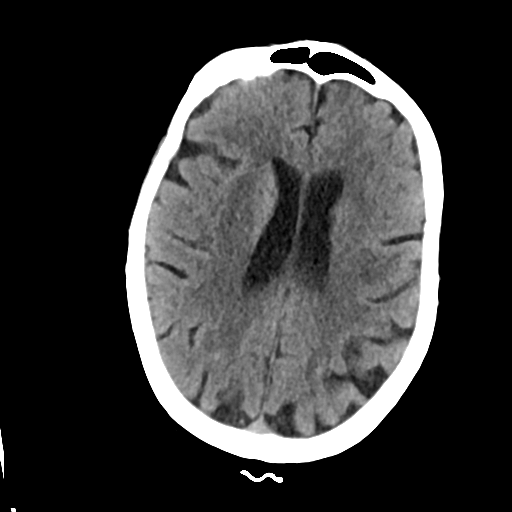
[im 21/34  bone]
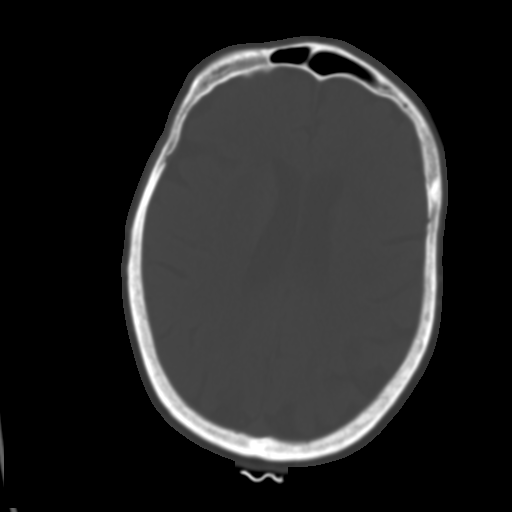
[im 25/34  brain]
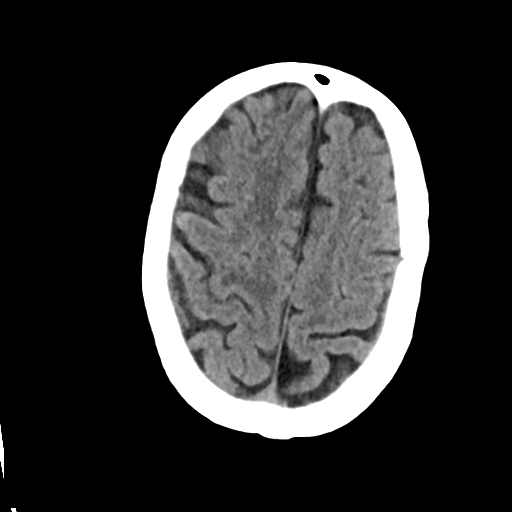
[im 29/34  brain]
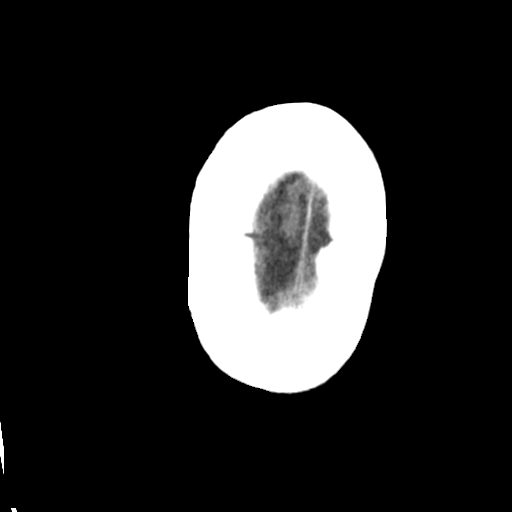

[Series 4: cor soft · coronal · 0.33mm/px · 3 of 74 slices shown]
[im 25/74  brain]
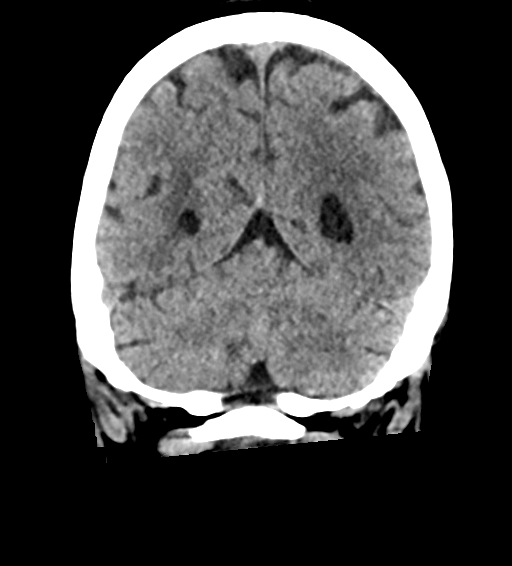
[im 33/74  brain]
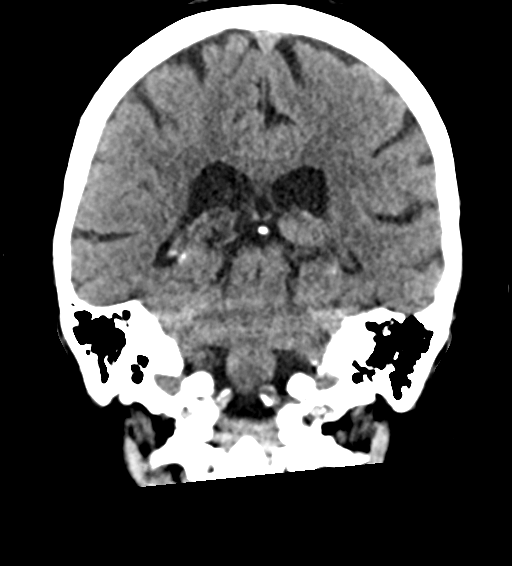
[im 41/74  brain]
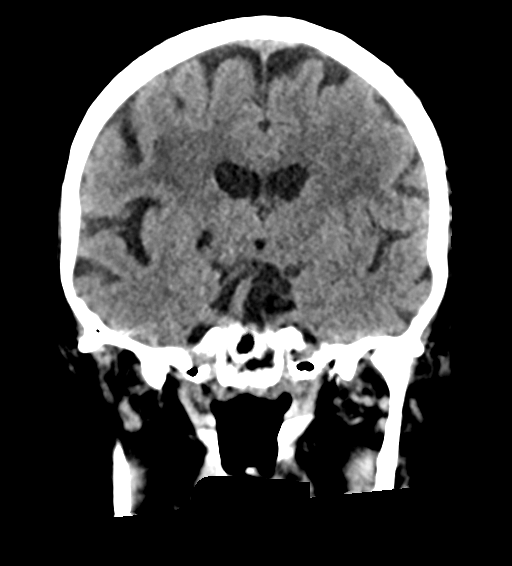

[Series 5: sag soft · sagittal · 0.37mm/px · 3 of 55 slices shown]
[im 19/55  brain]
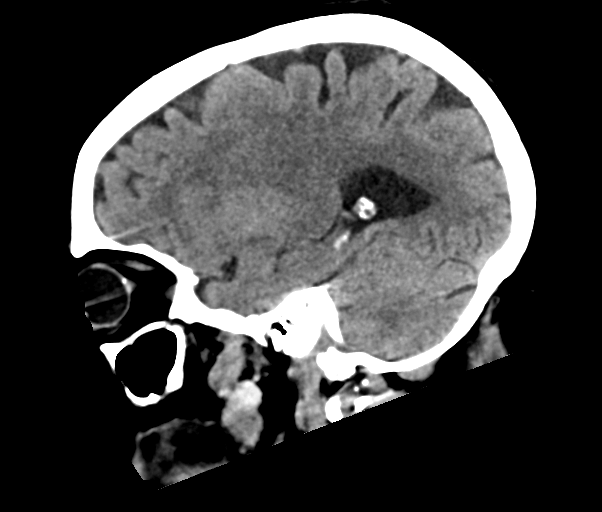
[im 28/55  brain]
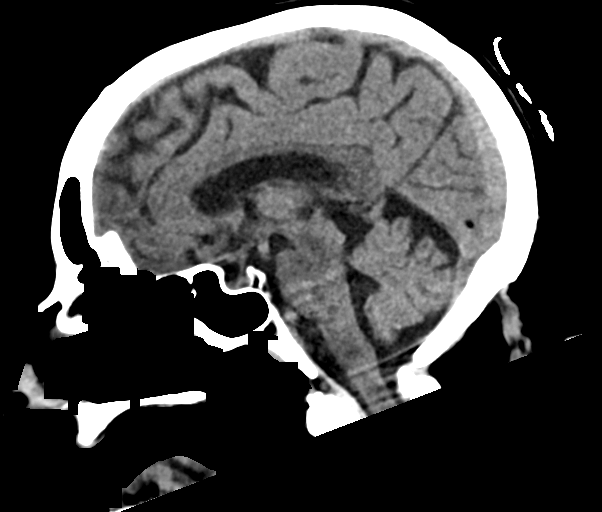
[im 37/55  brain]
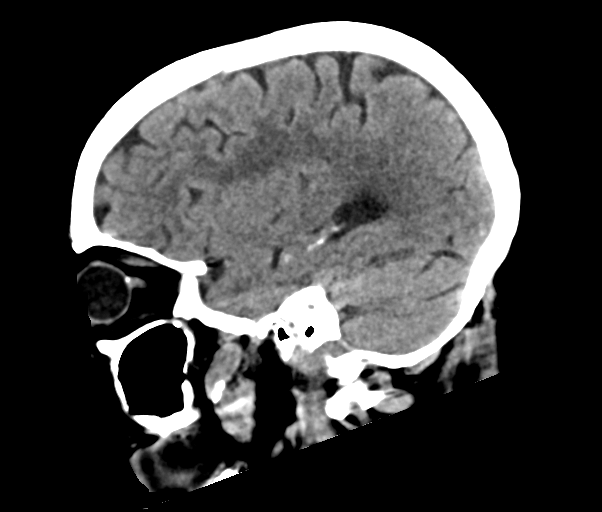

[16 of 47 positions shown; findings below may reference images not displayed]

FINDINGS: Brain: No evidence of acute infarction, hemorrhage, hydrocephalus,
extra-axial collection or mass lesion/mass effect. Mild-moderate
low-density changes within the periventricular and subcortical white
matter compatible with chronic microvascular ischemic change. Mild
diffuse cerebral volume loss.

Vascular: Atherosclerotic calcifications involving the large vessels
of the skull base. No unexpected hyperdense vessel.

Skull: Normal. Negative for fracture or focal lesion.

Sinuses/Orbits: No acute finding.

Other: None.
IMPRESSION: 1. No acute intracranial abnormality.
2. Chronic microvascular ischemic change and cerebral volume loss.

## 2021-08-14 IMAGING — DX DG CHEST 1V PORT
1 series · 1 of 1 positions shown · non-contrast
Comparison: [DATE]

CLINICAL DATA: New onset tremors beginning 1 week ago.

EXAM:
PORTABLE CHEST 1 VIEW

[chest ap]
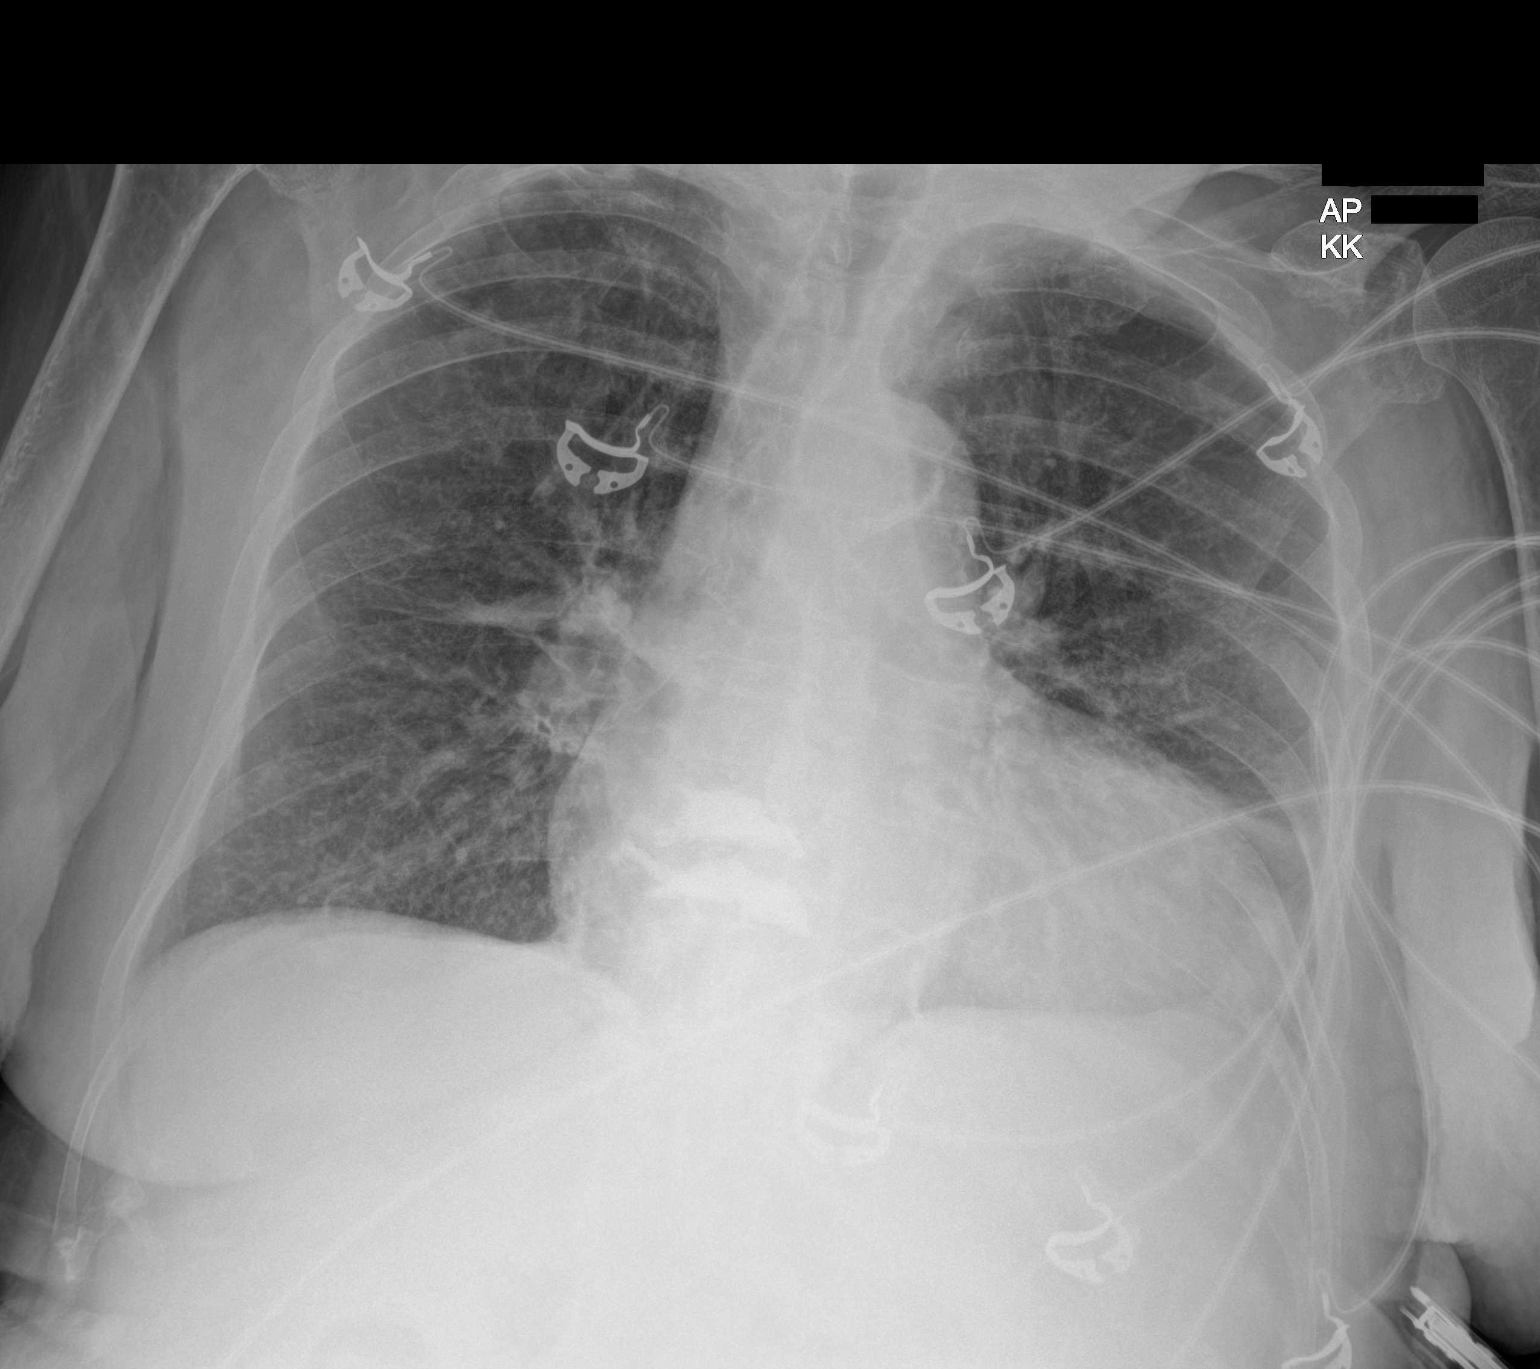

[1 of 1 positions shown; findings below may reference images not displayed]

FINDINGS: Cardiac silhouette is mildly enlarged. No mediastinal or hilar
masses.

Mild opacity extends laterally from the right hilum along the minor
fissure, improved from the prior exam, consistent with atelectasis.
There are prominent interstitial markings bilaterally, stable. No
evidence of pneumonia or pulmonary edema.

No convincing pleural effusion and no pneumothorax.

Skeletal structures are grossly intact.
IMPRESSION: 1. No acute cardiopulmonary disease.

## 2021-08-14 IMAGING — US US EXTREM LOW VENOUS
1 series · 13 of 24 positions shown · non-contrast
Comparison: None.

CLINICAL DATA: Pain and swelling



[Series 1: us venous img lower bilat (dvt) · portal-venous · 13 of 66 slices shown]
[im 1/66]
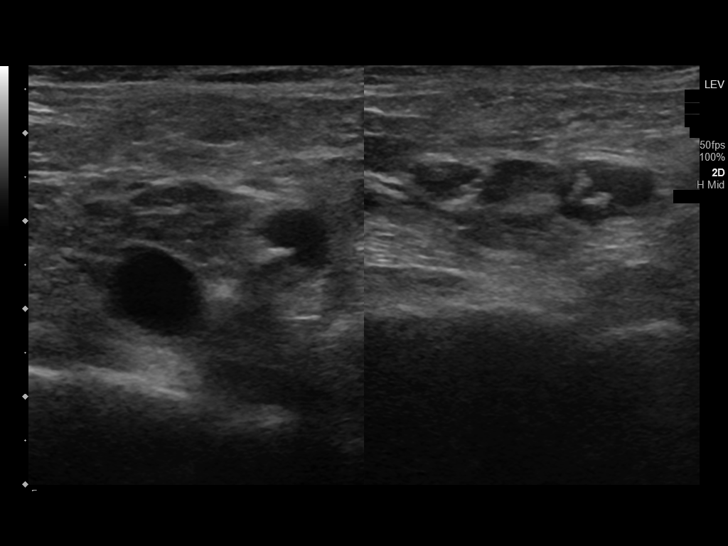
[im 6/66]
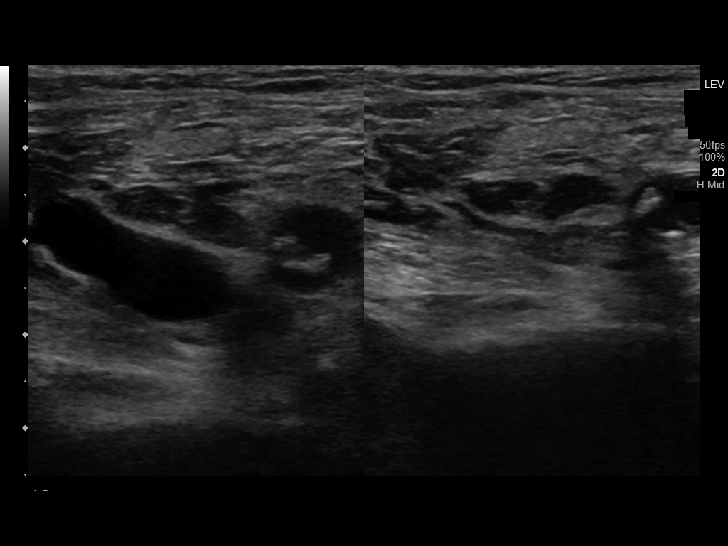
[im 12/66]
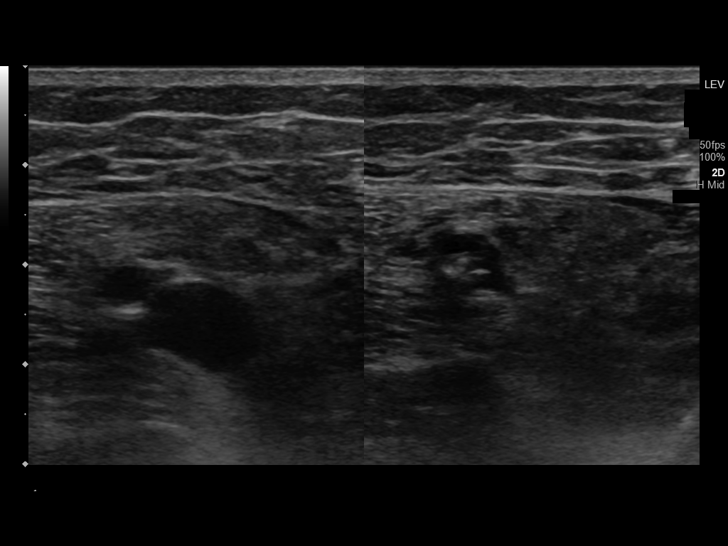
[im 17/66]
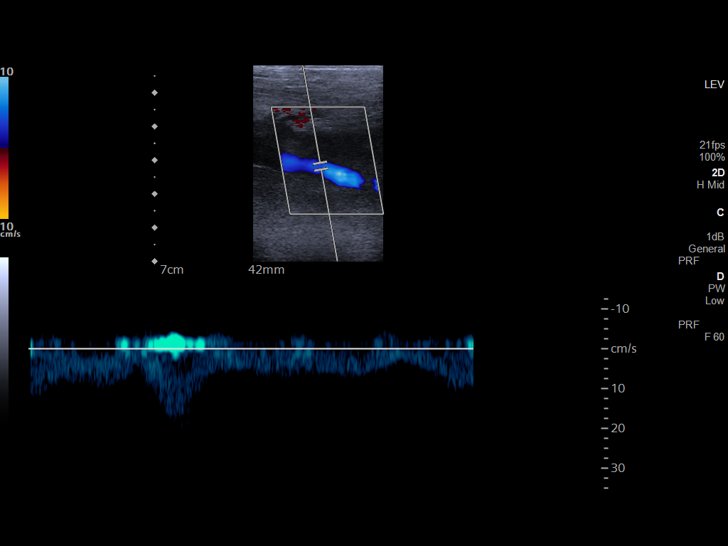
[im 23/66]
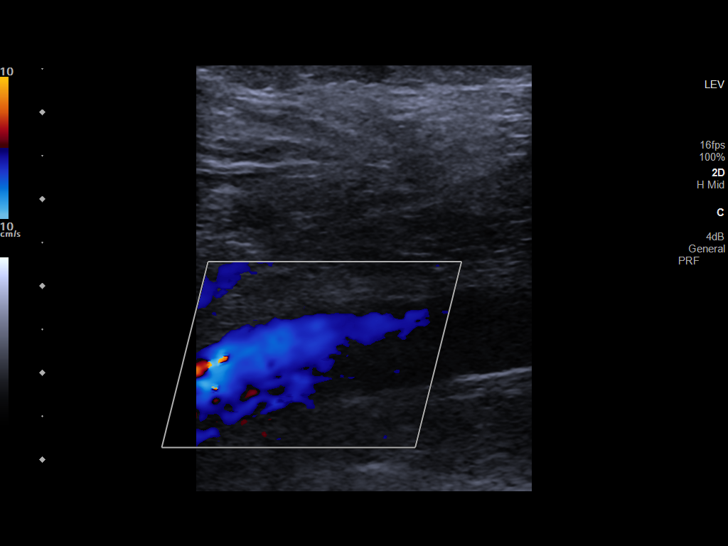
[im 29/66]
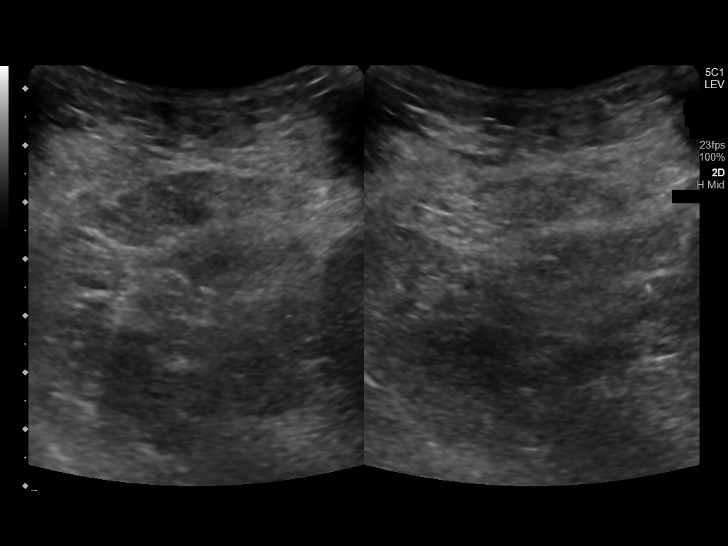
[im 34/66]
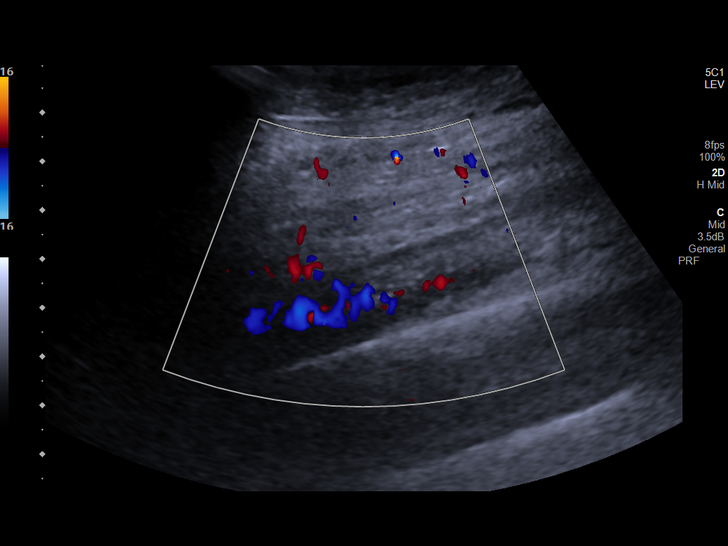
[im 37/66]
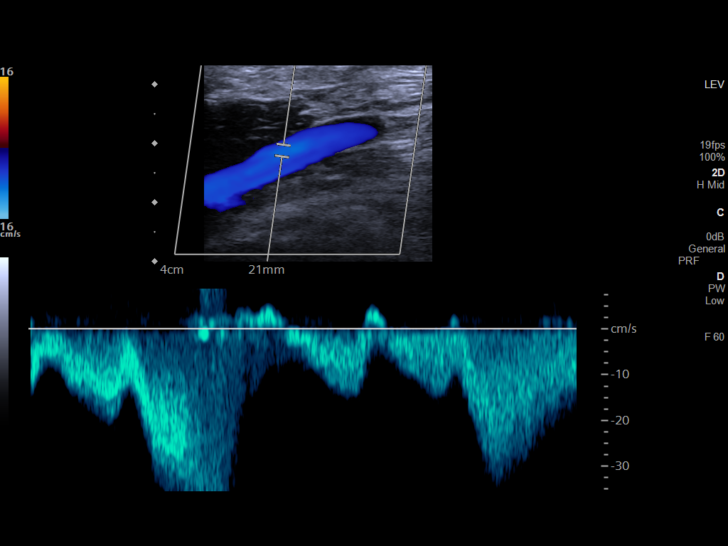
[im 43/66]
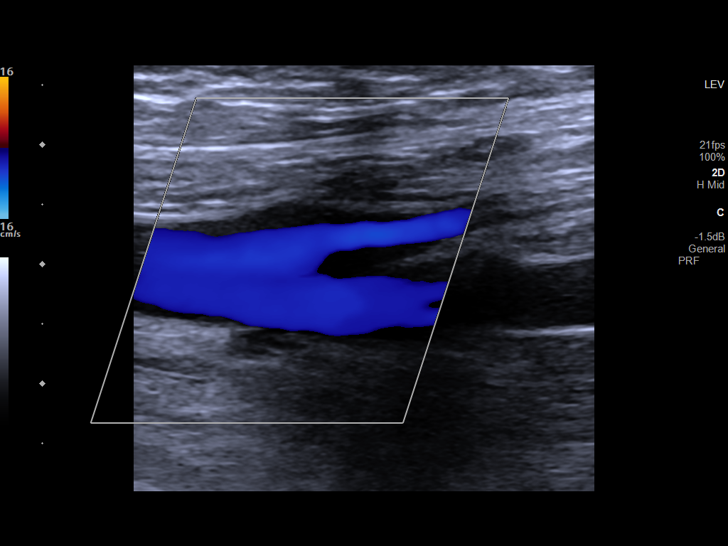
[im 49/66]
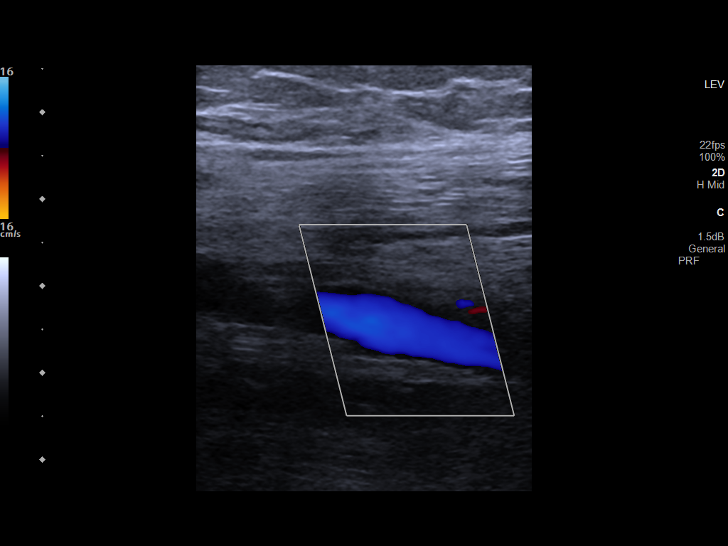
[im 54/66]
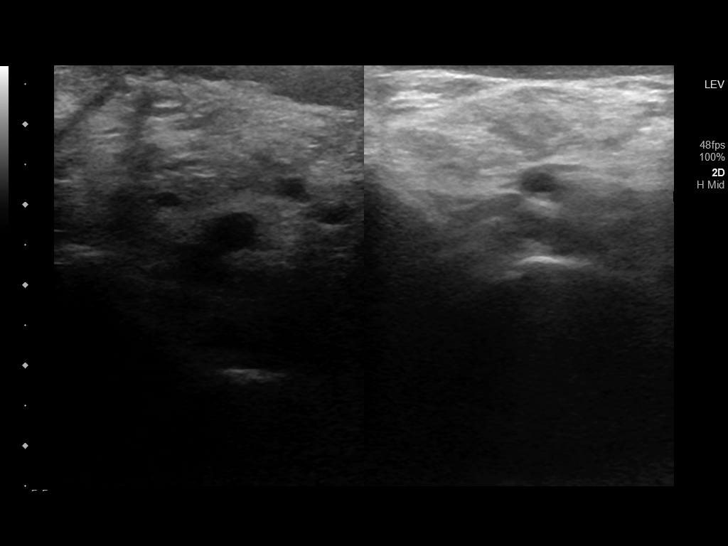
[im 60/66]
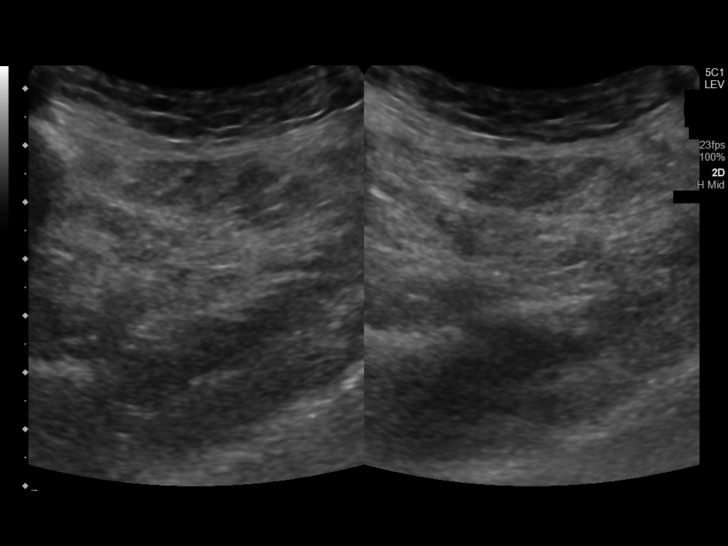
[im 66/66]
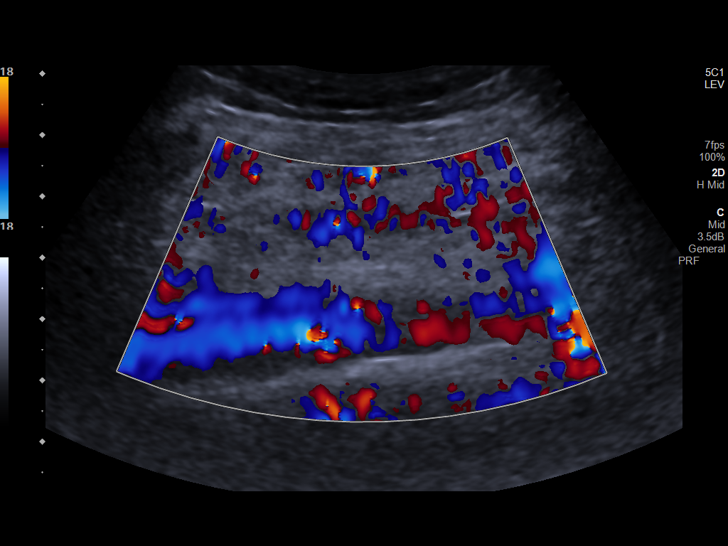

[13 of 24 positions shown; findings below may reference images not displayed]

FINDINGS: RIGHT LOWER EXTREMITY

Common Femoral Vein: No evidence of thrombus. Normal
compressibility, respiratory phasicity and response to augmentation.

Saphenofemoral Junction: No evidence of thrombus. Normal
compressibility and flow on color Doppler imaging.

Profunda Femoral Vein: No evidence of thrombus. Normal
compressibility and flow on color Doppler imaging.

Femoral Vein: No evidence of thrombus. Normal compressibility,
respiratory phasicity and response to augmentation.

Popliteal Vein: No evidence of thrombus. Normal compressibility,
respiratory phasicity and response to augmentation.

Calf Veins: No evidence of thrombus. Normal compressibility and flow
on color Doppler imaging.

Superficial Great Saphenous Vein: No evidence of thrombus. Normal
compressibility.

Venous Reflux:  None.

Other Findings:  None.

LEFT LOWER EXTREMITY

Common Femoral Vein: No evidence of thrombus. Normal
compressibility, respiratory phasicity and response to augmentation.

Saphenofemoral Junction: No evidence of thrombus. Normal
compressibility and flow on color Doppler imaging.

Profunda Femoral Vein: No evidence of thrombus. Normal
compressibility and flow on color Doppler imaging.

Femoral Vein: No evidence of thrombus. Normal compressibility,
respiratory phasicity and response to augmentation.

Popliteal Vein: No evidence of thrombus. Normal compressibility,
respiratory phasicity and response to augmentation.

Calf Veins: No evidence of thrombus. Normal compressibility and flow
on color Doppler imaging.

Superficial Great Saphenous Vein: No evidence of thrombus. Normal
compressibility.

Venous Reflux:  None.

Other Findings:  None.
IMPRESSION: No evidence of deep venous thrombosis in either lower extremity.

## 2021-08-14 MED ORDER — MUPIROCIN 2 % EX OINT
TOPICAL_OINTMENT | Freq: Two times a day (BID) | CUTANEOUS | Status: DC
  Filled 2021-08-14 (×3): qty 22

## 2021-08-14 MED ORDER — ALBUTEROL SULFATE (2.5 MG/3ML) 0.083% IN NEBU
2.5000 mg | INHALATION_SOLUTION | RESPIRATORY_TRACT | Status: DC | PRN
Start: 2021-08-14 — End: 2021-08-19

## 2021-08-14 MED ORDER — POTASSIUM CHLORIDE CRYS ER 20 MEQ PO TBCR
40.0000 meq | EXTENDED_RELEASE_TABLET | Freq: Once | ORAL | Status: AC
  Administered 2021-08-14: 40 meq via ORAL
  Filled 2021-08-14: qty 2

## 2021-08-14 MED ORDER — ENOXAPARIN SODIUM 40 MG/0.4ML IJ SOSY
40.0000 mg | PREFILLED_SYRINGE | INTRAMUSCULAR | Status: DC
  Administered 2021-08-14 – 2021-08-17 (×4): 40 mg via SUBCUTANEOUS
  Filled 2021-08-14 (×4): qty 0.4

## 2021-08-14 MED ORDER — DOXEPIN HCL 50 MG PO CAPS
50.0000 mg | ORAL_CAPSULE | Freq: Every day | ORAL | Status: DC
  Administered 2021-08-14 – 2021-08-17 (×4): 50 mg via ORAL
  Filled 2021-08-14 (×6): qty 1

## 2021-08-14 MED ORDER — CALCIUM GLUCONATE-NACL 2-0.675 GM/100ML-% IV SOLN
2.0000 g | Freq: Once | INTRAVENOUS | Status: DC
  Filled 2021-08-14: qty 100

## 2021-08-14 MED ORDER — LIDOCAINE 5 % EX PTCH
1.0000 | MEDICATED_PATCH | CUTANEOUS | Status: DC
  Administered 2021-08-14 – 2021-08-17 (×4): 1 via TRANSDERMAL
  Filled 2021-08-14 (×4): qty 1

## 2021-08-14 MED ORDER — PAROXETINE HCL 30 MG PO TABS
30.0000 mg | ORAL_TABLET | Freq: Every day | ORAL | Status: DC
  Administered 2021-08-14 – 2021-08-18 (×5): 30 mg via ORAL
  Filled 2021-08-14 (×6): qty 1

## 2021-08-14 MED ORDER — ALPRAZOLAM 0.25 MG PO TABS: 0.2500 mg | ORAL_TABLET | Freq: Every day | ORAL | Status: DC | PRN

## 2021-08-14 MED ORDER — LIDOCAINE 5 % EX PTCH: 2.0000 | MEDICATED_PATCH | CUTANEOUS | Status: DC

## 2021-08-14 MED ORDER — INSULIN ASPART 100 UNIT/ML IJ SOLN: 0.0000 [IU] | Freq: Every day | INTRAMUSCULAR | Status: DC

## 2021-08-14 MED ORDER — ACETAMINOPHEN 325 MG PO TABS
650.0000 mg | ORAL_TABLET | Freq: Four times a day (QID) | ORAL | Status: DC | PRN
  Administered 2021-08-15: 650 mg via ORAL
  Filled 2021-08-14 (×2): qty 2

## 2021-08-14 MED ORDER — SODIUM CHLORIDE 0.9 % IV SOLN
1.0000 g | INTRAVENOUS | Status: DC
  Administered 2021-08-14 – 2021-08-15 (×2): 1 g via INTRAVENOUS
  Filled 2021-08-14: qty 10
  Filled 2021-08-14: qty 1
  Filled 2021-08-14: qty 10

## 2021-08-14 MED ORDER — HYDRALAZINE HCL 20 MG/ML IJ SOLN: 5.0000 mg | INTRAMUSCULAR | Status: DC | PRN

## 2021-08-14 MED ORDER — LOSARTAN POTASSIUM 25 MG PO TABS
12.5000 mg | ORAL_TABLET | Freq: Every day | ORAL | Status: DC
  Administered 2021-08-14 – 2021-08-18 (×5): 12.5 mg via ORAL
  Filled 2021-08-14 (×5): qty 1
  Filled 2021-08-14: qty 0.5

## 2021-08-14 MED ORDER — INSULIN ASPART 100 UNIT/ML IJ SOLN
0.0000 [IU] | Freq: Three times a day (TID) | INTRAMUSCULAR | Status: DC
  Administered 2021-08-15: 1 [IU] via SUBCUTANEOUS
  Administered 2021-08-16: 2 [IU] via SUBCUTANEOUS
  Administered 2021-08-17: 1 [IU] via SUBCUTANEOUS
  Administered 2021-08-17: 2 [IU] via SUBCUTANEOUS
  Administered 2021-08-18 (×2): 1 [IU] via SUBCUTANEOUS
  Filled 2021-08-14 (×6): qty 1

## 2021-08-14 MED ORDER — MAGNESIUM SULFATE 50 % IJ SOLN
6.0000 g | Freq: Once | INTRAVENOUS | Status: AC
  Administered 2021-08-14: 6 g via INTRAVENOUS
  Filled 2021-08-14: qty 10

## 2021-08-14 MED ORDER — OXYCODONE-ACETAMINOPHEN 5-325 MG PO TABS
1.0000 | ORAL_TABLET | Freq: Four times a day (QID) | ORAL | Status: DC | PRN
  Administered 2021-08-15 – 2021-08-18 (×10): 1 via ORAL
  Filled 2021-08-14 (×11): qty 1

## 2021-08-14 MED ORDER — POLYETHYLENE GLYCOL 3350 17 G PO PACK
17.0000 g | PACK | Freq: Every day | ORAL | Status: DC | PRN
  Administered 2021-08-15 – 2021-08-17 (×2): 17 g via ORAL
  Filled 2021-08-14 (×2): qty 1

## 2021-08-14 MED ORDER — NICOTINE 21 MG/24HR TD PT24
21.0000 mg | MEDICATED_PATCH | Freq: Every day | TRANSDERMAL | Status: DC
Start: 2021-08-14 — End: 2021-08-19
  Administered 2021-08-14 – 2021-08-18 (×5): 21 mg via TRANSDERMAL
  Filled 2021-08-14 (×5): qty 1

## 2021-08-14 MED ORDER — SODIUM CHLORIDE 0.9 % IV SOLN
4.0000 g | Freq: Once | INTRAVENOUS | Status: AC
  Administered 2021-08-14: 4 g via INTRAVENOUS
  Filled 2021-08-14: qty 40

## 2021-08-14 MED ORDER — LIDOCAINE 5 % EX PTCH: 1.0000 | MEDICATED_PATCH | CUTANEOUS | Status: DC

## 2021-08-14 MED ORDER — KETOROLAC TROMETHAMINE 15 MG/ML IJ SOLN
15.0000 mg | Freq: Once | INTRAMUSCULAR | Status: AC
  Administered 2021-08-14: 15 mg via INTRAVENOUS
  Filled 2021-08-14: qty 1

## 2021-08-14 MED ORDER — ATORVASTATIN CALCIUM 20 MG PO TABS
20.0000 mg | ORAL_TABLET | ORAL | Status: DC
  Administered 2021-08-15 – 2021-08-18 (×3): 20 mg via ORAL
  Filled 2021-08-14 (×3): qty 1

## 2021-08-14 MED ORDER — GABAPENTIN 300 MG PO CAPS
300.0000 mg | ORAL_CAPSULE | Freq: Every day | ORAL | Status: DC
  Administered 2021-08-14 – 2021-08-17 (×4): 300 mg via ORAL
  Filled 2021-08-14 (×4): qty 1

## 2021-08-14 MED ORDER — CLOPIDOGREL BISULFATE 75 MG PO TABS
75.0000 mg | ORAL_TABLET | Freq: Every day | ORAL | Status: DC
  Administered 2021-08-14 – 2021-08-18 (×5): 75 mg via ORAL
  Filled 2021-08-14 (×5): qty 1

## 2021-08-14 MED ORDER — MAGNESIUM SULFATE 2 GM/50ML IV SOLN
2.0000 g | Freq: Once | INTRAVENOUS | Status: DC
  Filled 2021-08-14: qty 50

## 2021-08-14 MED ORDER — DIPHENHYDRAMINE HCL 50 MG/ML IJ SOLN
12.5000 mg | Freq: Three times a day (TID) | INTRAMUSCULAR | Status: DC | PRN
Start: 2021-08-14 — End: 2021-08-19

## 2021-08-14 MED ORDER — ASPIRIN EC 81 MG PO TBEC
81.0000 mg | DELAYED_RELEASE_TABLET | Freq: Every day | ORAL | Status: DC
  Administered 2021-08-14 – 2021-08-18 (×5): 81 mg via ORAL
  Filled 2021-08-14 (×5): qty 1

## 2021-08-14 MED ORDER — CARVEDILOL 3.125 MG PO TABS
6.2500 mg | ORAL_TABLET | Freq: Two times a day (BID) | ORAL | Status: DC
  Administered 2021-08-14 – 2021-08-18 (×9): 6.25 mg via ORAL
  Filled 2021-08-14 (×9): qty 2

## 2021-08-14 NOTE — Consult Note (Signed)
WOC Nurse Consult Note: ?Patient receiving care in [facility] 141 ?Consult completed remotely after review of chart and images.  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 Blaine Hamper ?Drainage (amount, consistency, odor) Scant amount on bandage ?Periwound: small erythema and edema noted on the anterior side of the foot ?Dressing procedure/placement/frequency: ?Clean the feet with soap and water, rinse and pat dry. Apply Mupirocin (Bactroban) on any wounds on the toes then wrap the toes with 1" Kerlix weeving in between the toes. Apply twice daily. ? ?Monitor the wound area(s) for worsening of condition such as: ?Signs/symptoms of infection, increase in size, development of or worsening of odor, ?development of pain, or increased pain at the affected locations.   ?Notify the medical team if any of these develop. ? ?Thank you for the consult. [place] nurse will not follow at this time.   ?Please re-consult the [place] team if needed. ? ?Cathlean Marseilles. Tamala Julian, MSN, RN, CMSRN, AGCNS, WTA ?Wound Treatment Associate ?Pager [phone]   ? ? ?  ?

## 2021-08-14 NOTE — Consult Note (Signed)
PHARMACY CONSULT NOTE - FOLLOW UP ? ?Pharmacy Consult for Electrolyte Monitoring and Replacement  ? ?Recent Labs: ?Potassium (mmol/L)  ?Date Value  ?08/14/2021 3.7  ? ?Magnesium (mg/dL)  ?Date Value  ?08/14/2021 <0.5 (LL)  ? ?Calcium (mg/dL)  ?Date Value  ?08/14/2021 6.3 (LL)  ? ?Albumin (g/dL)  ?Date Value  ?08/14/2021 3.2 (L)  ? ?Phosphorus (mg/dL)  ?Date Value  ?08/14/2021 4.8 (H)  ? ?Sodium (mmol/L)  ?Date Value  ?08/14/2021 140  ?06/27/2021 141  ? ?Corrected Calcium: 6.94 ? ?Assessment: ?Pharmacy has been consulted to monitor and replace electrolytes in 81yo female admitted via EMS with new onset tremors.  ? ?Goal of Therapy:  ?Electrolytes WNL, K>=4, Mg >=2 ? ?Plan:  ?-Admitting physician has placed orders for Calcium gluconate 4g IV x 1 and Mag sulfate 6g IV x 1 ?-KCL 78mEq x 1 ordered ?-Will recheck Mag level@1800  ?-Will recheck all electrolytes with AM labs ? ?Pearla Dubonnet ,PharmD ?Clinical Pharmacist ?08/14/2021 1:13 PM ? ?

## 2021-08-14 NOTE — ED Provider Notes (Addendum)
? ?[facility] ?Provider Note ? ? ? Event Date/Time  ? First MD Initiated Contact with Patient 08/14/21 [phone]   ?  (approximate) ? ? ?History  ? ?Tremors ? ? ?HPI ? ?Marissa Fletcher is a 81 y.o. female who presents to the ED for evaluation of Tremors ?  ?I reviewed outpatient evaluation from 3/21.  Patient was seen after a fall and diagnosed with left-sided rib fracture.  Discharged with naproxen and oxycodone.  History of diabetes.Chronic asymmetric lower extremity edema, worse on the left. 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 ?History of stroke with residual left-sided weakness, ambulatory with a walker.  Resides at home. ? ?She is on an SSRI, but no antipsychotics.  Denies any ethanol or other recreational drugs.  Aside from recent pain medications for her broken rib, denies any changes to her medications. ? ?She presents to the ED for evaluation of 1 week of involuntary tremors and jerking motions throughout her body.  Reports inability to control jerking throughout her whole body.  Denies any focal symptoms.  Denies any syncope associated with the symptoms.  Denies any acute pain, trauma or injuries since her rib fracture. ? ?Reports her breathing is "about normal."  She does acknowledge that her legs are little more swollen than normal.  Denies fevers.   ? ?Reports poor p.o. intake over the past few days.  Reports a couple episodes of emesis on Thursday postprandially. ? ? ?Physical Exam  ? ?Triage Vital Signs: ?ED Triage Vitals  ?Enc Vitals Group  ?   BP   ?   Pulse   ?   Resp   ?   Temp   ?   Temp src   ?   SpO2   ?   Weight   ?   Height   ?   Head Circumference   ?   Peak Flow   ?   Pain Score   ?   Pain Loc   ?   Pain Edu?   ?   Excl. in [place]?   ? ? ?Most recent vital signs: ?Vitals:  ? 08/14/21 0930 08/14/21 1100  ?BP: (!) 126/107 140/64  ?Pulse: 95 93  ?Resp: (!) 25 20  ?Temp:    ?SpO2: 97% 96%  ? ? ?General: Awake, no distress.  Kyphotic and  conversational. ?Does have diffuse tremulousness and intermittent jerking motions throughout her body.  Does not seem to be focal. ?CV:  Good peripheral perfusion.  ?Resp:  Normal effort.  No wheezing ?Abd:  No distention.  Soft and benign ?MSK:  No deformity noted.  ?  Left > right pitting edema ?Neuro:  No focal deficits appreciated.  Very mild left-sided weakness, she reports is chronic.  Cranial nerves intact. ?Other:   ? ? ?ED Results / Procedures / Treatments  ? ?Labs ?(all labs ordered are listed, but only abnormal results are displayed) ?Labs Reviewed  ?MAGNESIUM - Abnormal; Notable for the following components:  ?    Result Value  ? Magnesium <0.5 (*)   ? All other components within normal limits  ?COMPREHENSIVE METABOLIC PANEL - Abnormal; Notable for the following components:  ? Chloride 97 (*)   ? Glucose, Bld 121 (*)   ? Calcium 6.3 (*)   ? Albumin 3.2 (*)   ? Alkaline Phosphatase 147 (*)   ? Anion gap 17 (*)   ? All other components within normal limits  ?CBC WITH DIFFERENTIAL/PLATELET - Abnormal; Notable for the  following components:  ? WBC 13.6 (*)   ? Hemoglobin 10.1 (*)   ? HCT 33.6 (*)   ? MCV 76.7 (*)   ? MCH 23.1 (*)   ? RDW 19.2 (*)   ? Platelets 504 (*)   ? Neutro Abs 10.9 (*)   ? All other components within normal limits  ?URINALYSIS, ROUTINE W REFLEX MICROSCOPIC - Abnormal; Notable for the following components:  ? Color, Urine YELLOW (*)   ? APPearance CLOUDY (*)   ? Hgb urine dipstick SMALL (*)   ? Protein, ur 30 (*)   ? Nitrite POSITIVE (*)   ? Leukocytes,Ua LARGE (*)   ? WBC, UA >50 (*)   ? Bacteria, UA MANY (*)   ? All other components within normal limits  ?BRAIN NATRIURETIC PEPTIDE - Abnormal; Notable for the following components:  ? B Natriuretic Peptide 361.1 (*)   ? All other components within normal limits  ?PHOSPHORUS - Abnormal; Notable for the following components:  ? Phosphorus 4.8 (*)   ? All other components within normal limits  ?CBG MONITORING, ED - Abnormal; Notable for the  following components:  ? Glucose-Capillary 133 (*)   ? All other components within normal limits  ?CULTURE, BLOOD (ROUTINE X 2)  ?CULTURE, BLOOD (ROUTINE X 2)  ?AMMONIA  ?BASIC METABOLIC PANEL  ?CALCIUM, IONIZED  ?LACTIC ACID, PLASMA  ?LACTIC ACID, PLASMA  ?PROCALCITONIN  ?SEDIMENTATION RATE  ?C-REACTIVE PROTEIN  ? ? ?EKG ?Poor quality due to tremulousness.  Sinus rhythm with a rate of 100 bpm.  Normal axis.  Prolonged QTc at 562 ms.  No clear ischemic features. ? ?RADIOLOGY ?CT head reviewed by me without evidence of acute intracranial pathology. ?CXR reviewed by me without evidence of acute cardiopulmonary pathology. ? ?Official radiology report(s): ?CT HEAD WO CONTRAST (5MM) ? ?Result Date: 08/14/2021 ?CLINICAL DATA:  Left-sided weakness, tremor EXAM: CT HEAD WITHOUT CONTRAST TECHNIQUE: Contiguous axial images were obtained from the base of the skull through the vertex without intravenous contrast. RADIATION DOSE REDUCTION: This exam was performed according to the departmental dose-optimization program which includes automated exposure control, adjustment of the mA and/or kV according to patient size and/or use of iterative reconstruction technique. COMPARISON:  05/18/2021 FINDINGS: Brain: No evidence of acute infarction, hemorrhage, hydrocephalus, extra-axial collection or mass lesion/mass effect. Mild-moderate low-density changes within the periventricular and subcortical white matter compatible with chronic microvascular ischemic change. Mild diffuse cerebral volume loss. Vascular: Atherosclerotic calcifications involving the large vessels of the skull base. No unexpected hyperdense vessel. Skull: Normal. Negative for fracture or focal lesion. Sinuses/Orbits: No acute finding. Other: None. IMPRESSION: 1. No acute intracranial abnormality. 2. Chronic microvascular ischemic change and cerebral volume loss. Electronically Signed   By: Davina Poke D.O.   On: 08/14/2021 10:26  ? ?DG Chest Portable 1  View ? ?Result Date: 08/14/2021 ?CLINICAL DATA:  New onset tremors beginning 1 week ago. EXAM: PORTABLE CHEST 1 VIEW COMPARISON:  05/17/2021 FINDINGS: Cardiac silhouette is mildly enlarged. No mediastinal or hilar masses. Mild opacity extends laterally from the right hilum along the minor fissure, improved from the prior exam, consistent with atelectasis. There are prominent interstitial markings bilaterally, stable. No evidence of pneumonia or pulmonary edema. No convincing pleural effusion and no pneumothorax. Skeletal structures are grossly intact. IMPRESSION: 1. No acute cardiopulmonary disease. Electronically Signed   By: Lajean Manes M.D.   On: 08/14/2021 10:34   ? ?PROCEDURES and INTERVENTIONS: ? ?.1-3 Lead EKG Interpretation ?Performed by: Vladimir Crofts, MD ?Authorized by:  Vladimir Crofts, MD  ? ?  Interpretation: normal   ?  ECG rate:  94 ?  ECG rate assessment: normal   ?  Rhythm: sinus rhythm   ?  Ectopy: none   ?  Conduction: normal   ?.Critical Care ?Performed by: Vladimir Crofts, MD ?Authorized by: Vladimir Crofts, MD  ? ?Critical care provider statement:  ?  Critical care time (minutes):  30 ?  Critical care time was exclusive of:  Separately billable procedures and treating other patients ?  Critical care was necessary to treat or prevent imminent or life-threatening deterioration of the following conditions:  Metabolic crisis ?  Critical care was time spent personally by me on the following activities:  Development of treatment plan with patient or surrogate, discussions with consultants, evaluation of patient's response to treatment, examination of patient, ordering and review of laboratory studies, ordering and review of radiographic studies, ordering and performing treatments and interventions, pulse oximetry, re-evaluation of patient's condition and review of old charts ? ?Medications  ?magnesium sulfate 6 g in dextrose 5 % 100 mL IVPB (6 g Intravenous New Bag/Given 08/14/21 1152)  ?enoxaparin (LOVENOX)  injection 40 mg (has no administration in time range)  ?acetaminophen (TYLENOL) tablet 650 mg (has no administration in time range)  ?hydrALAZINE (APRESOLINE) injection 5 mg (has no administration in time range)  ?nicotine (NICODER

## 2021-08-14 NOTE — H&P (Addendum)
?History and Physical  ? ? ?Marissa Fletcher MWN:[id number] DOB: 1941-04-10 DOA: 08/14/2021 ? ?Referring MD/NP/PA:  ? ?PCP: Juluis Pitch, MD  ? ?Patient coming from:  The patient is coming from home.  At baseline, pt is independent for most of ADL.       ? ?Chief Complaint: shaking/tremor ? ?HPI: Marissa Fletcher is a 81 y.o. 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 ? ?Patient states that she has whole body shaking/tremor for almost a week. It is very difficult for her to control her body shaking.  She has history of stroke with mild left-sided, no new unilateral numbness or weakness.  No facial droop or slurred speech.  No difficulty swallowing.  No hearing loss or vision loss. She states that recently she has nausea, poor appetite and decreased oral intake.  No active vomiting, diarrhea.  No abdominal pain.  Patient has mild dry cough, and mild shortness of breath due to COPD, which has not changed.  No chest pain, fever or chills.  No symptoms of UTI.  Patient has bilateral lower leg edema, left leg is worse than the right.  She has mild erythema and mild tenderness in the left lower extremity and foot. She has small diabetic ulcers in left second toe and right third toe, no pain, erythema or drainage. ? ? ?Data Reviewed and ED Course: pt was found to have electrolytes disturbance with calcium 6.3, magnesium less than 0.5, phosphorus level 4.8, potassium 3.7, renal function okay, WBC 13.6, BNP 361, ammonia level 22, positive urinalysis (cloudy appearance, large amount of leukocyte, positive nitrite, many bacteria, WBC > 50). Temperature normal, blood pressure 140/64, heart rate 98, RR 26, oxygen saturation 95% on room air.  Chest x-ray negative.   CT head negative for acute intracranial abnormalities.  Patient is placed on telemetry bed for observation. ? ?EKG: I have personally reviewed. Sinus rhythm, QTc 562, low voltage, PAC. ? ? ?Review of Systems:  ? ?General: no fevers, chills, no body weight gain, has poor appetite, has fatigue ?HEENT: no blurry vision, hearing changes or sore throat ?Respiratory: has dyspnea, coughing, no wheezing ?CV: no chest pain, no palpitations ?GI: has nausea, no vomiting, abdominal pain, diarrhea, constipation ?GU: no dysuria, burning on urination, increased urinary frequency, hematuria  ?Ext: has leg edema ?Neuro: no vision change or hearing loss. Has tremor and shaking.  Has left-sided weakness from previous stroke ?Skin: no rash, no skin tear. ?MSK: No muscle spasm, no deformity, no limitation of range of movement in spin ?Heme: No easy bruising.  ?Travel history: No recent long distant travel. ? ? ?Allergy:  ?Allergies  ?Allergen Reactions  ? Sulfa Antibiotics Swelling  ? ? ?Past Medical History:  ?Diagnosis Date  ? Anxiety   ? Aortic atherosclerosis ([place])   ? Arthritis   ? Cardiomyopathy ([place])   ? a. 04/2021 Echo: EF 35-40%, glob HK. GrII DD.  Nl RV size/fxn. Mod dil LA. Mild MR.  ? Carotid arterial disease ([place])   ? a. 04/2021 CTA Head/neck: RICA 60, LICA 55.  ? COPD (chronic obstructive pulmonary disease) ([place])   ? Depression   ? Diabetes mellitus without complication ([place])   ? History of kidney stones   ? Hypertension   ? Lung cancer ([place])   ? Stroke (cerebrum) ([place])   ?  a. 04/2021 MRI brain: Acute infarct post limb of R internal capsule.  ? Thoracic aortic aneurysm   ? Tobacco abuse   ? ? ?Past Surgical History:  ?Procedure Laterality Date  ? ABDOMINAL HYSTERECTOMY    ? CHOLECYSTECTOMY    ? EYE SURGERY Bilateral   ? KYPHOPLASTY N/A 07/30/2020  ? Procedure: T10 Kyphoplasty;  Surgeon: Hessie Knows, MD;  Location: [facility] ORS;  Service: Orthopedics;  Laterality: N/A;  T10  ? KYPHOPLASTY N/A 08/19/2020  ? Procedure: T9  YPHOPLASTY;  Surgeon: Hessie Knows, MD;  Location: [facility] ORS;  Service: Orthopedics;  Laterality: N/A;  ? ? ?Social History:  reports that she has been smoking cigarettes. She has a 34.00 pack-year smoking history. She has never used smokeless tobacco. She reports that she does not drink alcohol and does not use drugs. ? ?Family History:  ?Family History  ?Problem Relation Age of Onset  ? Dementia Mother   ? Congestive Heart Failure Mother   ? Bladder Cancer Father   ? Heart disease Father   ? Breast cancer Neg Hx   ?  ? ?Prior to Admission medications   ?Medication Sig Start Date End Date Taking? Authorizing Provider  ?acetaminophen (TYLENOL) 500 MG tablet Take 2 tablets (1,000 mg total) by mouth 3 (three) times daily as needed for mild pain, moderate pain or headache. 05/20/21   Nita Sells, MD  ?ALPRAZolam Duanne Moron) 0.25 MG tablet Take 0.25 mg by mouth daily as needed for anxiety. 04/19/20   [provider]  ?aspirin EC 81 MG EC tablet Take 1 tablet (81 mg total) by mouth daily. Swallow whole. 05/21/21   Nita Sells, MD  ?atorvastatin (LIPITOR) 20 MG tablet Take 20 mg by mouth every Monday, Wednesday, and Friday.    [provider]  ?carvedilol (COREG) 6.25 MG tablet Take 1 tablet (6.25 mg total) by mouth 2 (two) times daily with a meal. 07/13/21 08/12/21  End, Harrell Gave, MD  ?clopidogrel (PLAVIX) 75 MG tablet Take 1 tablet (75 mg total) by mouth daily. 05/21/21   Nita Sells, MD  ?dapagliflozin propanediol (FARXIGA) 10 MG TABS tablet Take 10 mg by mouth daily. ?Patient not taking: Reported on 07/13/2021    [provider]  ?doxepin (SINEQUAN) 25 MG capsule Take 50-75 mg by mouth at bedtime. 04/01/20   [provider]  ?furosemide (LASIX) 40 MG tablet Take 1 tablet (40 mg total) by mouth daily. 07/13/21 10/11/21  End, Harrell Gave, MD  ?gabapentin (NEURONTIN) 300 MG capsule Take 300 mg by mouth at bedtime. 02/18/20   [provider]  ?losartan  (COZAAR) 25 MG tablet Take 0.5 tablets (12.5 mg total) by mouth daily. 05/20/21   Theora Gianotti, NP  ?metFORMIN (GLUCOPHAGE-XR) 500 MG 24 hr tablet Take 500 mg by mouth 2 (two) times daily. 03/08/20   [provider]  ?PARoxetine (PAXIL) 30 MG tablet Take 30 mg by mouth every morning. 03/08/20   [provider]  ?polyethylene glycol (MIRALAX / GLYCOLAX) 17 g packet Take 17 g by mouth daily as needed for moderate constipation. 05/12/20   Loletha Grayer, MD  ? ? ?Physical Exam: ?Vitals:  ? 08/14/21 1334 08/14/21 1338 08/14/21 1442 08/14/21 1620  ?BP: 128/72  (!) 131/97 (!) 161/67  ?Pulse: 88  94 98  ?Resp: 20  20 18   ?Temp:  98.3 ?F (36.8 ?C) 98.1 ?F (36.7 ?C) 98.2 ?F (36.8 ?C)  ?TempSrc:  Oral Oral   ?SpO2: 98%  96% 97%  ?Weight:      ? ?  General: Not in acute distress ?HEENT: ?      Eyes: PERRL, EOMI, no scleral icterus. ?      ENT: No discharge from the ears and nose, no pharynx injection, no tonsillar enlargement.  ?      Neck: No JVD, no bruit, no mass felt. ?Heme: No neck lymph node enlargement. ?Cardiac: S1/S2, RRR, No murmurs, No gallops or rubs. ?Respiratory: No rales, wheezing, rhonchi or rubs. ?GI: Soft, nondistended, nontender, no rebound pain, no organomegaly, BS present. ?GU: No hematuria ?Ext: No pitting leg edema bilaterally. 1+DP/PT pulse bilaterally. ?Musculoskeletal: No joint deformities, No joint redness or warmth, no limitation of ROM in spin. ?Skin:  ? ? ? ? ? ? ? ? ? ? ? ?Neuro: Alert, oriented X3, cranial nerves II-XII grossly intact, has mild left-sided weakness from previous stroke ?Psych: Patient is not psychotic, no suicidal or hemocidal ideation. ? ?Labs on Admission: I have personally reviewed following labs and imaging studies ? ?CBC: ?Recent Labs  ?Lab 08/14/21 ?0938  ?WBC 13.6*  ?NEUTROABS 10.9*  ?HGB 10.1*  ?HCT 33.6*  ?MCV 76.7*  ?PLT 504*  ? ?Basic Metabolic Panel: ?Recent Labs  ?Lab 08/14/21 ?8280 08/14/21 ?1313  ?NA 140 139  ?K 3.7 3.2*  ?CL 97* 100   ?CO2 26 28  ?GLUCOSE 121* 139*  ?BUN 18 17  ?CREATININE 0.88 0.79  ?CALCIUM 6.3* 7.5*  ?MG <0.5*  --   ?PHOS 4.8*  --   ? ?GFR: ?Estimated Creatinine Clearance: 48.6 mL/min (by C-G formula based on SCr of 0.79 m

## 2021-08-14 NOTE — ED Triage Notes (Signed)
Pt ems from home for new onset tremors that started x 1 week.  ?

## 2021-08-14 NOTE — Plan of Care (Signed)

## 2021-08-15 ENCOUNTER — Ambulatory Visit: Payer: Medicare Other | Admitting: Radiation Oncology

## 2021-08-15 ENCOUNTER — Observation Stay: Payer: Medicare Other

## 2021-08-15 DIAGNOSIS — L03116 Cellulitis of left lower limb: Secondary | ICD-10-CM | POA: Diagnosis present

## 2021-08-15 DIAGNOSIS — E1169 Type 2 diabetes mellitus with other specified complication: Secondary | ICD-10-CM | POA: Diagnosis present

## 2021-08-15 DIAGNOSIS — I712 Thoracic aortic aneurysm, without rupture, unspecified: Secondary | ICD-10-CM | POA: Diagnosis present

## 2021-08-15 DIAGNOSIS — L97529 Non-pressure chronic ulcer of other part of left foot with unspecified severity: Secondary | ICD-10-CM | POA: Diagnosis present

## 2021-08-15 DIAGNOSIS — D509 Iron deficiency anemia, unspecified: Secondary | ICD-10-CM | POA: Diagnosis present

## 2021-08-15 DIAGNOSIS — R251 Tremor, unspecified: Secondary | ICD-10-CM | POA: Diagnosis present

## 2021-08-15 DIAGNOSIS — A4159 Other Gram-negative sepsis: Secondary | ICD-10-CM | POA: Diagnosis present

## 2021-08-15 DIAGNOSIS — F32A Depression, unspecified: Secondary | ICD-10-CM | POA: Diagnosis present

## 2021-08-15 DIAGNOSIS — E11621 Type 2 diabetes mellitus with foot ulcer: Secondary | ICD-10-CM | POA: Diagnosis present

## 2021-08-15 DIAGNOSIS — Z20822 Contact with and (suspected) exposure to covid-19: Secondary | ICD-10-CM | POA: Diagnosis present

## 2021-08-15 DIAGNOSIS — F17210 Nicotine dependence, cigarettes, uncomplicated: Secondary | ICD-10-CM | POA: Diagnosis present

## 2021-08-15 DIAGNOSIS — A419 Sepsis, unspecified organism: Secondary | ICD-10-CM | POA: Diagnosis not present

## 2021-08-15 DIAGNOSIS — L97519 Non-pressure chronic ulcer of other part of right foot with unspecified severity: Secondary | ICD-10-CM | POA: Diagnosis present

## 2021-08-15 DIAGNOSIS — E440 Moderate protein-calorie malnutrition: Secondary | ICD-10-CM | POA: Diagnosis present

## 2021-08-15 DIAGNOSIS — N390 Urinary tract infection, site not specified: Secondary | ICD-10-CM | POA: Diagnosis present

## 2021-08-15 DIAGNOSIS — E878 Other disorders of electrolyte and fluid balance, not elsewhere classified: Secondary | ICD-10-CM | POA: Diagnosis not present

## 2021-08-15 DIAGNOSIS — J449 Chronic obstructive pulmonary disease, unspecified: Secondary | ICD-10-CM | POA: Diagnosis present

## 2021-08-15 DIAGNOSIS — I429 Cardiomyopathy, unspecified: Secondary | ICD-10-CM | POA: Diagnosis present

## 2021-08-15 DIAGNOSIS — Z66 Do not resuscitate: Secondary | ICD-10-CM | POA: Diagnosis present

## 2021-08-15 DIAGNOSIS — M1990 Unspecified osteoarthritis, unspecified site: Secondary | ICD-10-CM | POA: Diagnosis present

## 2021-08-15 DIAGNOSIS — I69354 Hemiplegia and hemiparesis following cerebral infarction affecting left non-dominant side: Secondary | ICD-10-CM | POA: Diagnosis not present

## 2021-08-15 DIAGNOSIS — I5042 Chronic combined systolic (congestive) and diastolic (congestive) heart failure: Secondary | ICD-10-CM | POA: Diagnosis present

## 2021-08-15 DIAGNOSIS — E785 Hyperlipidemia, unspecified: Secondary | ICD-10-CM | POA: Diagnosis present

## 2021-08-15 DIAGNOSIS — I700 Atherosclerosis of aorta: Secondary | ICD-10-CM | POA: Diagnosis present

## 2021-08-15 DIAGNOSIS — I110 Hypertensive heart disease with heart failure: Secondary | ICD-10-CM | POA: Diagnosis present

## 2021-08-15 LAB — CBC
HCT: 30.3 % — ABNORMAL LOW (ref 36.0–46.0)
Hemoglobin: 8.9 g/dL — ABNORMAL LOW (ref 12.0–15.0)
MCH: 23 pg — ABNORMAL LOW (ref 26.0–34.0)
MCHC: 29.4 g/dL — ABNORMAL LOW (ref 30.0–36.0)
MCV: 78.3 fL — ABNORMAL LOW (ref 80.0–100.0)
Platelets: 438 10*3/uL — ABNORMAL HIGH (ref 150–400)
RBC: 3.87 MIL/uL (ref 3.87–5.11)
RDW: 19.2 % — ABNORMAL HIGH (ref 11.5–15.5)
WBC: 7.9 10*3/uL (ref 4.0–10.5)
nRBC: 0 % (ref 0.0–0.2)

## 2021-08-15 LAB — BASIC METABOLIC PANEL
Anion gap: 8 (ref 5–15)
BUN: 13 mg/dL (ref 8–23)
CO2: 26 mmol/L (ref 22–32)
Calcium: 6.6 mg/dL — ABNORMAL LOW (ref 8.9–10.3)
Chloride: 106 mmol/L (ref 98–111)
Creatinine, Ser: 0.6 mg/dL (ref 0.44–1.00)
GFR, Estimated: >60 mL/min (ref >60)
Glucose, Bld: 102 mg/dL — ABNORMAL HIGH (ref 70–99)
Potassium: 3.7 mmol/L (ref 3.5–5.1)
Sodium: 140 mmol/L (ref 135–145)

## 2021-08-15 LAB — GLUCOSE, CAPILLARY
Glucose-Capillary: 124 mg/dL — ABNORMAL HIGH (ref 70–99)
Glucose-Capillary: 114 mg/dL — ABNORMAL HIGH (ref 70–99)
Glucose-Capillary: 111 mg/dL — ABNORMAL HIGH (ref 70–99)
Glucose-Capillary: 128 mg/dL — ABNORMAL HIGH (ref 70–99)

## 2021-08-15 LAB — PHOSPHORUS: Phosphorus: 3.3 mg/dL (ref 2.5–4.6)

## 2021-08-15 LAB — MAGNESIUM: Magnesium: 1.9 mg/dL (ref 1.7–2.4)

## 2021-08-15 IMAGING — CR DG RIBS 2V*R*
4 series · 4 of 4 positions shown · non-contrast
Comparison: None.

CLINICAL DATA: Right chest pain

EXAM:
RIGHT RIBS - 2 VIEW

[rib ap (1 of 2)]
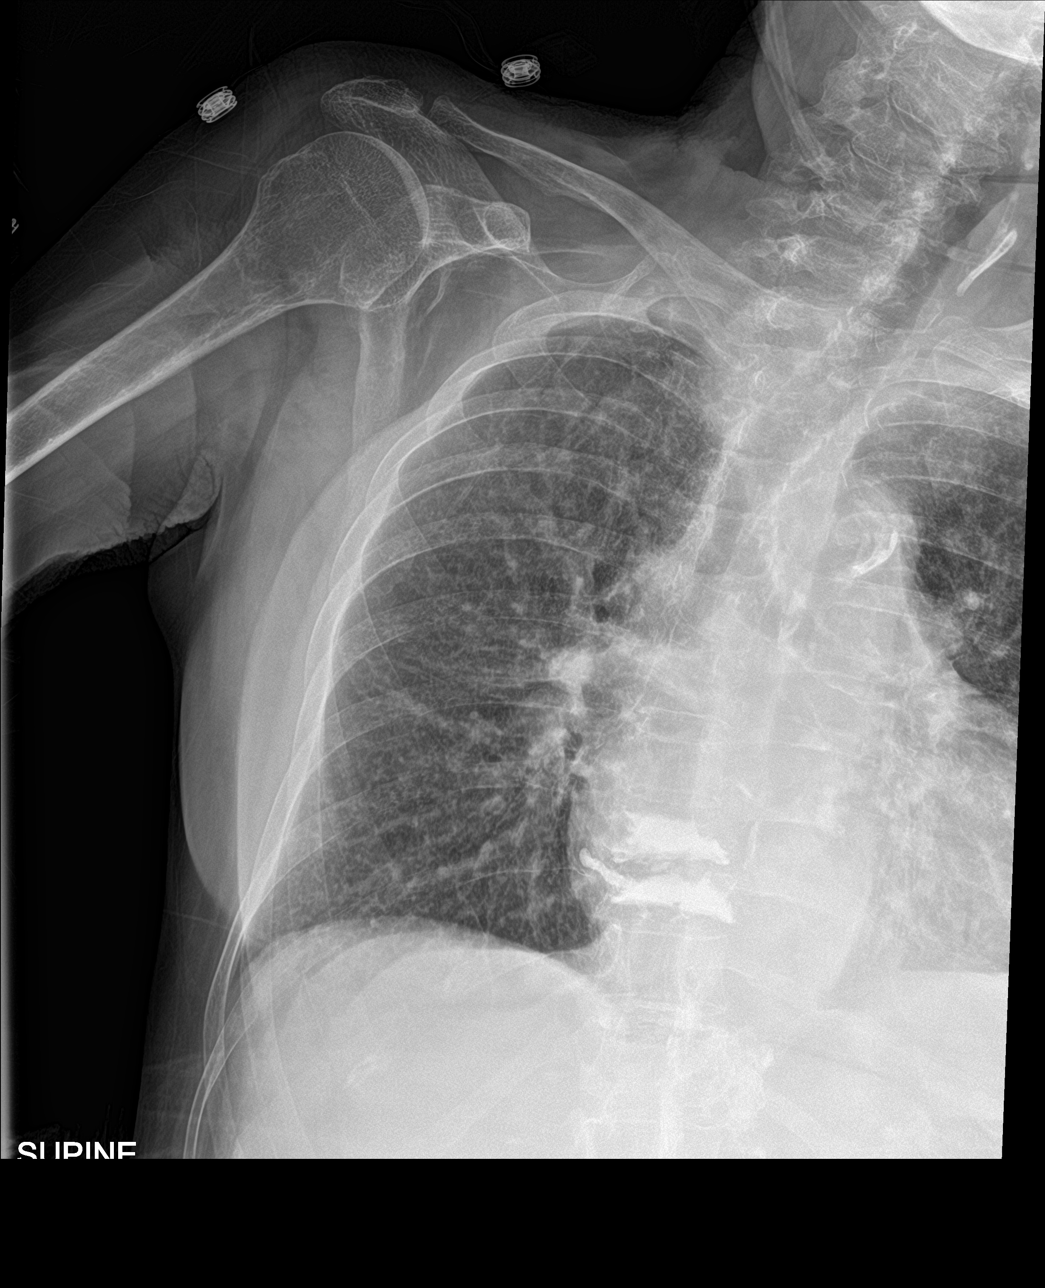

[rib ap (2 of 2)]
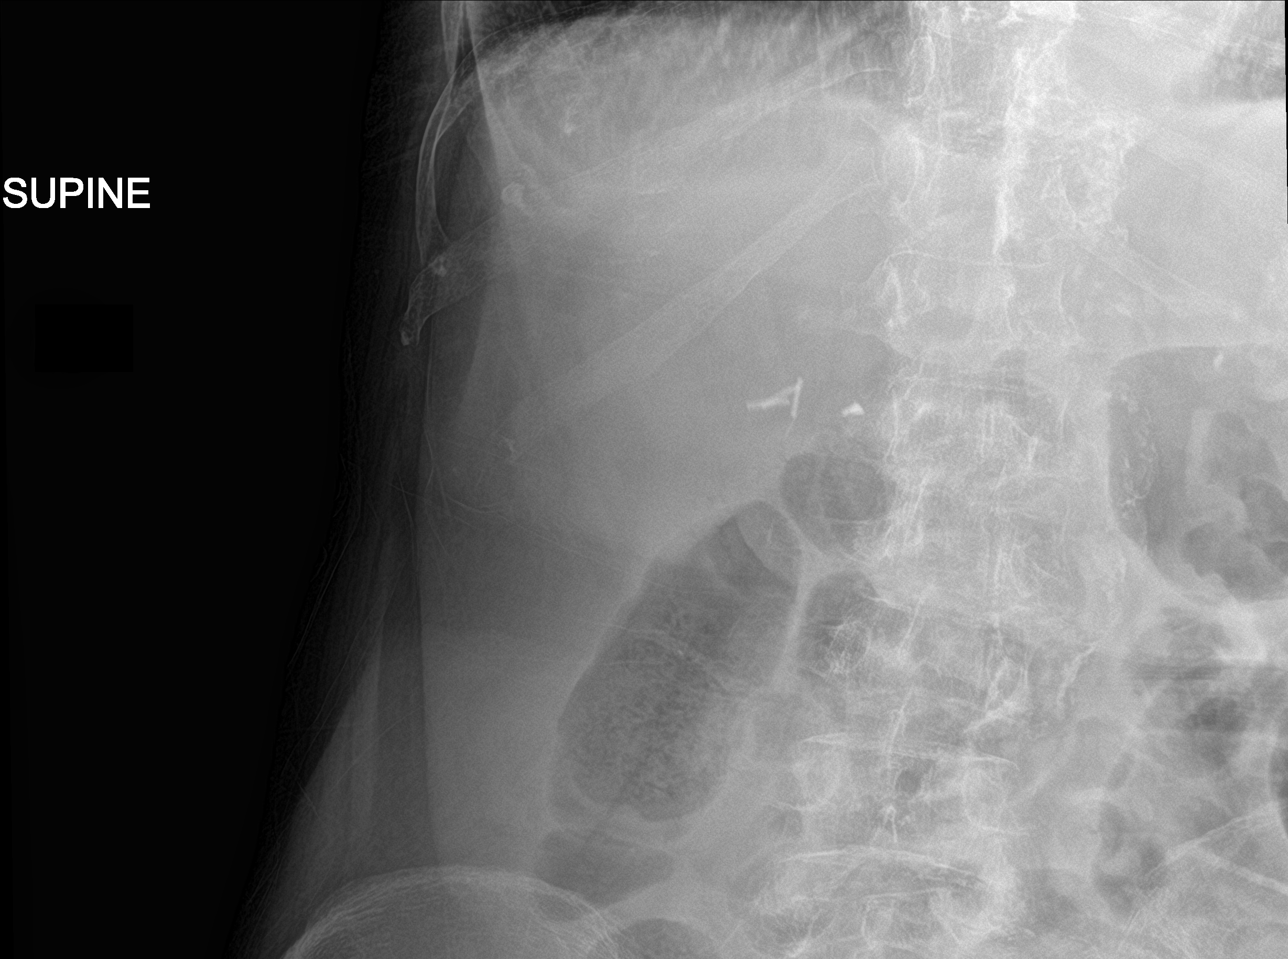

[rib ap obl (1 of 2)]
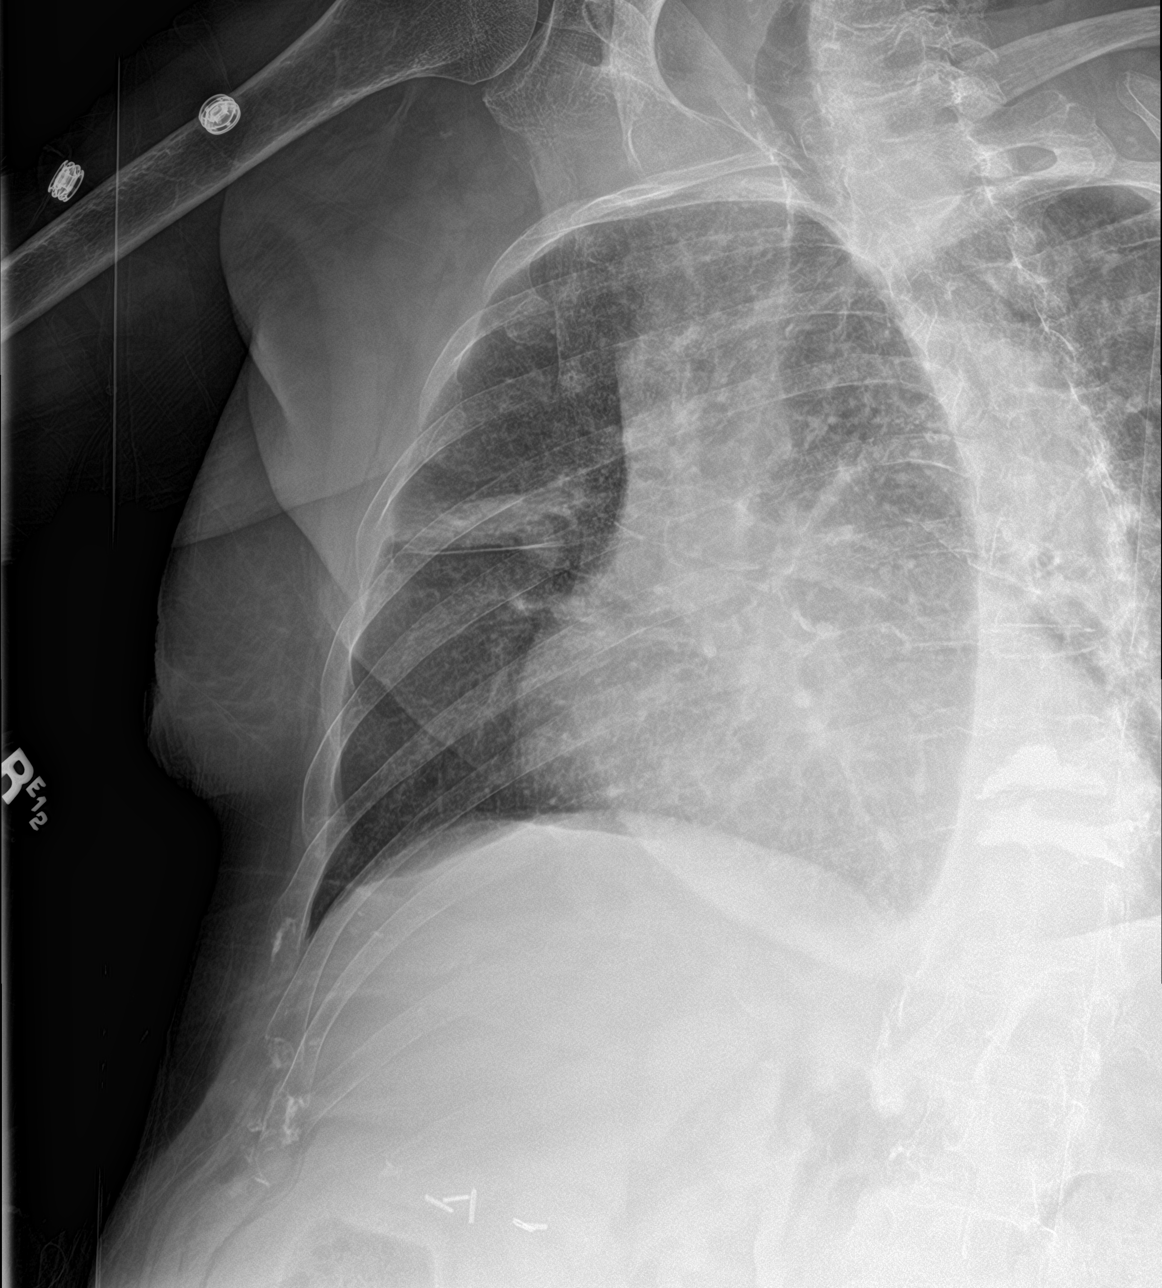

[rib ap obl (2 of 2)]
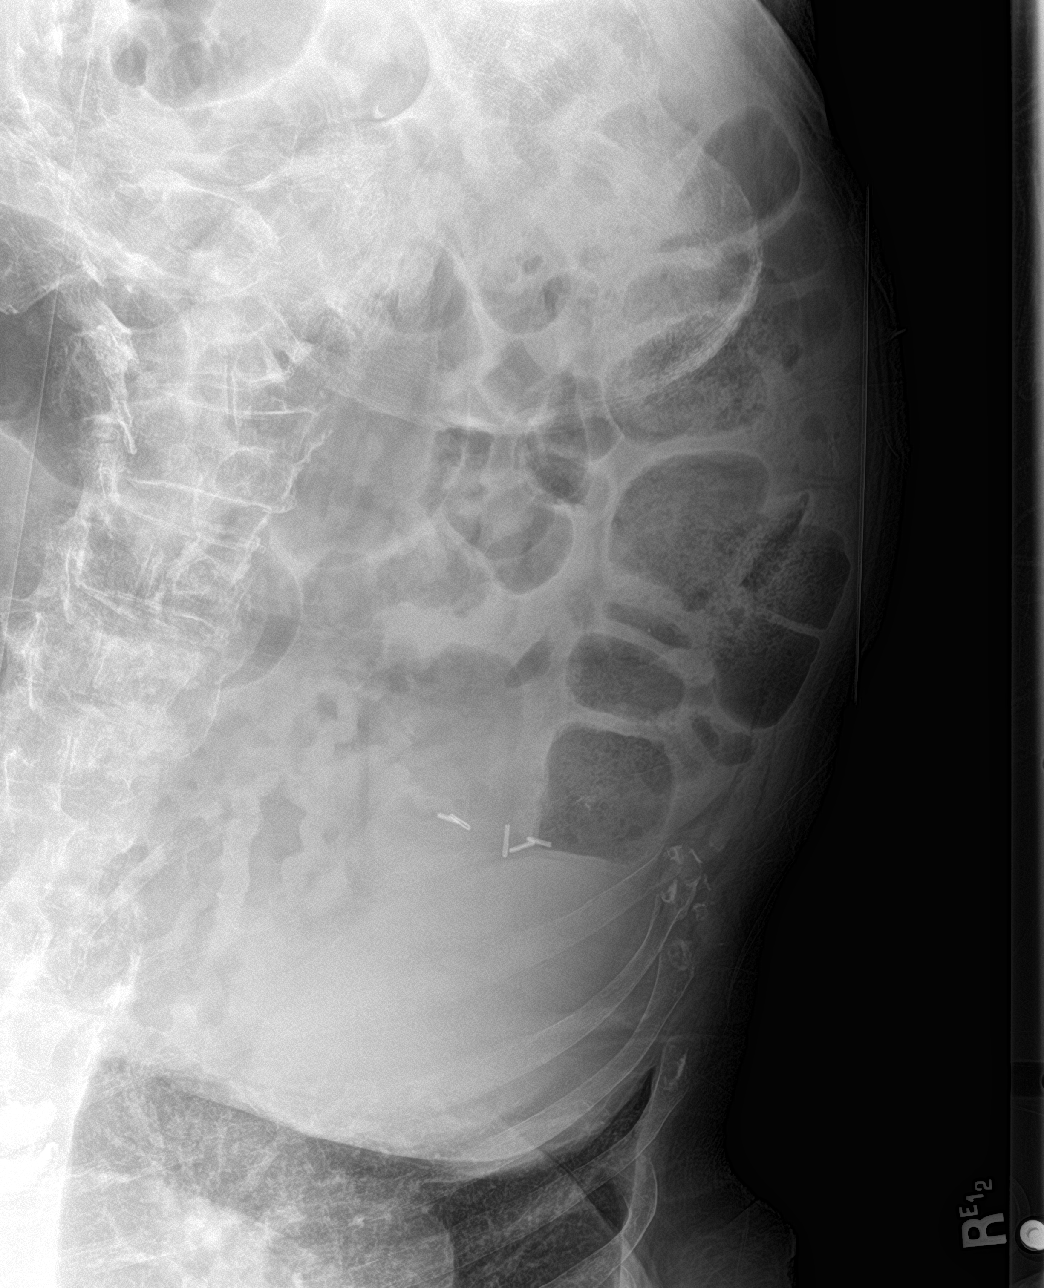

[4 of 4 positions shown; findings below may reference images not displayed]

FINDINGS: No displaced fractures are seen in the right ribs. There is previous
vertebroplasty in the lower thoracic spine. Osteopenia is seen in
bony structures. Surgical clips are seen in gallbladder fossa.
Arterial calcifications are seen.
IMPRESSION: No displaced fractures are seen in the right ribs.

## 2021-08-15 MED ORDER — CALCIUM GLUCONATE-NACL 2-0.675 GM/100ML-% IV SOLN
2.0000 g | Freq: Once | INTRAVENOUS | Status: AC
  Administered 2021-08-15: 2000 mg via INTRAVENOUS
  Filled 2021-08-15: qty 100

## 2021-08-15 MED ORDER — POTASSIUM CHLORIDE CRYS ER 20 MEQ PO TBCR
40.0000 meq | EXTENDED_RELEASE_TABLET | Freq: Once | ORAL | Status: AC
  Administered 2021-08-15: 40 meq via ORAL
  Filled 2021-08-15: qty 2

## 2021-08-15 MED ORDER — MAGNESIUM SULFATE 2 GM/50ML IV SOLN
2.0000 g | Freq: Once | INTRAVENOUS | Status: AC
  Administered 2021-08-15: 2 g via INTRAVENOUS
  Filled 2021-08-15: qty 50

## 2021-08-15 NOTE — Evaluation (Signed)
Physical Therapy Evaluation ?Patient Details ?Name: Marissa Fletcher ?MRN: [id number] ?DOB: [birth year] ?Today's Date: 08/15/2021 ? ?History of Present Illness ? AMEY HOSSAIN is a 81 y.o. 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 ?  ?Clinical Impression ? Pt beginning session w/ OT upon PT entrance into room for evaluation today. PT/OT co-evaluation for patient safety and to address functional deficits w/ ADLs. PLOF obtained by OT prior to PT entering room, but pt states she lives alone and uses a rollator at baseline; does report history of multiple falls over the past few months. ? ?Pt is sitting at EOB upon PT entrance into room, but can progress to standing w/ minAx2 using RW. Orthostatic vitals obtained throughout session due to RN report of Pt c/o dizziness while transferring to [facility] earlier today. BP sitting EOB was 128/71 and dropped to 78/59 with standing. Further mobility was deferred due to (+) orthostatics; pt returned to bed w/ SUPERVISION and educated on the important of eating something for nutritional benefits w/ BP. Pt will benefit from continued skilled PT in order to increase LE strength/endurance, improve mobility/gait, and restore PLOF. Current discharge recommendation to SNF is appropriate due to the level of assistance required by the patient to ensure safety and improve overall function. ?   ? ?Recommendations for follow up therapy are one component of a multi-disciplinary discharge planning process, led by the attending physician.  Recommendations may be updated based on patient status, additional functional criteria and insurance authorization. ? ?Follow Up Recommendations Skilled nursing-short term rehab (<3 hours/day) ? ?   ?Assistance Recommended at Discharge Intermittent Supervision/Assistance  ?Patient can return home with the following ? A little help with walking and/or transfers;A little help with bathing/dressing/bathroom;Assistance with cooking/housework;Assist for transportation;Help with stairs or ramp for entrance ? ?  ?Equipment Recommendations Rolling walker (2 wheels)  ?Recommendations for Other Services ?    ?  ?Functional Status Assessment Patient has had a recent decline in their functional status and demonstrates the ability to make significant improvements in function in a reasonable and predictable amount of time.  ? ?  ?Precautions / Restrictions Precautions ?Precautions: Fall ?Restrictions ?Weight Bearing Restrictions: No  ? ?  ? ?Mobility ? Bed Mobility ?Overal bed mobility: Needs Assistance ?Bed Mobility: Sit to Supine ?  ?  ?  ?Sit to supine: Supervision ?  ?  ?  ? ?Transfers ?Overall transfer level: Needs assistance ?Equipment used: Rolling walker (2 wheels) ?Transfers: Sit to/from Stand ?Sit to Stand: Min assist ?  ?  ?  ?  ?  ?General transfer comment: minAx2; progresses to modA in standing while obtaining BP due to R lateral lean ?  ? ?Ambulation/Gait ?  ?  ?  ?  ?Gait velocity: deferred ?  ?  ?  ? ?Stairs ?  ?  ?  ?  ?  ? ?Wheelchair Mobility ?  ? ?Modified Rankin (Stroke Patients Only) ?  ? ?  ? ?Balance Overall balance assessment: Needs assistance ?Sitting-balance support: Feet supported, Bilateral upper extremity supported ?Sitting balance-Leahy Scale: Fair ?  ?  ?Standing balance support: Reliant on assistive device for balance, During functional activity, Bilateral upper extremity supported ?Standing balance-Leahy Scale: Fair ?  ?  ?  ?  ?  ?  ?  ?  ?  ?  ?  ?  ?   ? ? ? ?  Pertinent Vitals/Pain Pain Assessment ?Pain Assessment: No/denies pain  ? ? ?Home Living Family/patient expects to be discharged to:: Private residence ?Living Arrangements: Alone ?Available Help at Discharge:  Family;Friend(s);Neighbor;Available PRN/intermittently ?Type of Home: Apartment ?Home Access: Level entry ?  ?  ?  ?Home Layout: One level ?Home Equipment: International aid/development worker (2 wheels);Rollator (4 wheels) ?   ?  ?Prior Function Prior Level of Function : Independent/Modified Independent ?  ?  ?  ?  ?  ?  ?Mobility Comments: reports falls hx using 4WW in home ?ADLs Comments: reports cooking ?  ? ? ?Hand Dominance  ?   ? ?  ?Extremity/Trunk Assessment  ? Upper Extremity Assessment ?Upper Extremity Assessment: Generalized weakness ?  ? ?Lower Extremity Assessment ?Lower Extremity Assessment: Generalized weakness ?  ? ?   ?Communication  ? Communication: No difficulties  ?Cognition Arousal/Alertness: Awake/alert ?Behavior During Therapy: [facility] for tasks assessed/performed ?Overall Cognitive Status: Within Functional Limits for tasks assessed ?  ?  ?  ?  ?  ?  ?  ?  ?  ?  ?  ?  ?  ?  ?  ?  ?  ?  ?  ? ?  ?General Comments   ? ?  ?Exercises    ? ?Assessment/Plan  ?  ?PT Assessment Patient needs continued PT services  ?PT Problem List Decreased strength;Decreased mobility;Decreased safety awareness;Decreased range of motion;Decreased coordination;Decreased activity tolerance;Decreased balance ? ?   ?  ?PT Treatment Interventions DME instruction;Therapeutic exercise;Gait training;Balance training;Stair training;Neuromuscular re-education;Functional mobility training;Therapeutic activities;Patient/family education   ? ?PT Goals (Current goals can be found in the Care Plan section)  ?Acute Rehab PT Goals ?Patient Stated Goal: to improve symptoms/strength to go home ?PT Goal Formulation: With patient ?Time For Goal Achievement: 08/29/21 ?Potential to Achieve Goals: Fair ? ?  ?Frequency Min 2X/week ?  ? ? ?Co-evaluation PT/OT/SLP Co-Evaluation/Treatment: Yes ?Reason for Co-Treatment: For patient/therapist safety;To address functional/ADL transfers ?PT goals addressed during session: Mobility/safety with  mobility ?OT goals addressed during session: ADL's and self-care ?  ? ? ?  ?AM-PAC PT "6 Clicks" Mobility  ?Outcome Measure Help needed turning from your back to your side while in a flat bed without using bedrails?: A Little ?Help needed moving from lying on your back to sitting on the side of a flat bed without using bedrails?: A Little ?Help needed moving to and from a bed to a chair (including a wheelchair)?: A Lot ?Help needed standing up from a chair using your arms (e.g., wheelchair or bedside chair)?: A Lot ?Help needed to walk in hospital room?: A Lot ?Help needed climbing 3-5 steps with a railing? : A Lot ?6 Click Score: 14 ? ?  ?End of Session Equipment Utilized During Treatment: Gait belt ?Activity Tolerance: Other (comment) (Patient limited by orthostatics) ?Patient left: in bed;with call bell/phone within reach;with bed alarm set;with family/visitor present ?Nurse Communication: Mobility status ?PT Visit Diagnosis: Unsteadiness on feet (R26.81);History of falling (Z91.81);Muscle weakness (generalized) (M62.81);Other abnormalities of gait and mobility (R26.89) ?  ? ?Time: 3818-2993 ?PT Time Calculation (min) (ACUTE ONLY): 14 min ? ? ?Charges:     ?  ?  ?   ? ? ?Jonnie Kind, SPT ?08/15/2021, 4:15 PM ? ?

## 2021-08-15 NOTE — Evaluation (Signed)
Occupational Therapy Evaluation ?Patient Details ?Name: Marissa Fletcher ?MRN: [id number] ?DOB: 1940-07-24 ?Today's Date: 08/15/2021 ? ? ?History of Present Illness Marissa Fletcher is a 81 y.o. 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  ? ?Clinical Impression ?  ?Marissa Fletcher was seen for OT/PT co-evaluation this date. Prior to hospital admission, pt was MOD I using 4WW for mobility and I/ADLs including cooking. Pt reports falls hx, sister at bedside endorses multiple falls. Pt lives alone in level entry apartment with family available to assist PRN. Pt presents to acute OT demonstrating impaired ADL performance and functional mobility 2/2 decreased activity tolerance and functional strength/balance deficits. Pt currently requires MIN A for bed mobility. MIN A x2 + RW sit<>stand, endorses dizziness and requires MOD A to maintain standing, tolerates ~1 min. Pt would benefit from skilled OT to address noted impairments and functional limitations (see below for any additional details). Upon hospital discharge, recommend  to maximize pt safety and return to PLOF. ?  ?Orthostatic Vitals ?SITTING: BP 128/71, MAP 82, HR 90 ?STANDING: BP 78/59, MAP 67, HR 104, + dizzy ?SITTING: BP 136/64, MAP 83, HR 91  ? ?Recommendations for follow up therapy are one component of a multi-disciplinary discharge planning process, led by the attending physician.  Recommendations may be updated based on patient status, additional functional criteria and insurance authorization.  ? ?Follow Up Recommendations ? Skilled nursing-short term rehab (<3 hours/day)  ?  ?Assistance Recommended at Discharge Intermittent Supervision/Assistance  ?Patient can return home with the following A lot  of help with walking and/or transfers;A lot of help with bathing/dressing/bathroom;Help with stairs or ramp for entrance ? ?  ?Functional Status Assessment ? Patient has had a recent decline in their functional status and demonstrates the ability to make significant improvements in function in a reasonable and predictable amount of time.  ?Equipment Recommendations ? None recommended by OT  ?  ?Recommendations for Other Services   ? ? ?  ?Precautions / Restrictions Precautions ?Precautions: Fall ?Restrictions ?Weight Bearing Restrictions: No  ? ?  ? ?Mobility Bed Mobility ?Overal bed mobility: Needs Assistance ?Bed Mobility: Supine to Sit, Sit to Supine ?  ?  ?Supine to sit: Supervision ?Sit to supine: Supervision ?  ?  ?  ? ?Transfers ?Overall transfer level: Needs assistance ?Equipment used: Rolling walker (2 wheels) ?Transfers: Sit to/from Stand, Bed to chair/wheelchair/BSC ?Sit to Stand: Min assist, +2 physical assistance ?  ?  ?  ?  ? Lateral/Scoot Transfers: Min guard ?  ?  ? ?  ?Balance Overall balance assessment: Needs assistance ?Sitting-balance support: No upper extremity supported, Feet supported ?Sitting balance-Leahy Scale: Good ?  ?  ?Standing balance support: Bilateral upper extremity supported ?Standing balance-Leahy Scale: Poor ?  ?  ?  ?  ?  ?  ?  ?  ?  ?  ?  ?  ?   ? ?ADL either performed or assessed with clinical judgement  ? ?ADL Overall ADL's : Needs assistance/impaired ?  ?  ?  ?  ?  ?  ?  ?  ?  ?  ?  ?  ?  ?  ?  ?  ?  ?  ?  ?General ADL Comments: MOD A + RW for ADL t/f. SUPERVISION dynamic sitting tasks. MIN A for  LB access seated EOB.  ? ? ? ? ?Pertinent Vitals/Pain Pain Assessment ?Pain Assessment: No/denies pain  ? ? ? ?Hand Dominance   ?  ?Extremity/Trunk Assessment Upper Extremity Assessment ?Upper Extremity Assessment: Generalized weakness ?  ?Lower Extremity Assessment ?Lower Extremity Assessment: Generalized weakness ?  ?  ?  ?Communication Communication ?Communication: No  difficulties ?  ?Cognition Arousal/Alertness: Awake/alert ?Behavior During Therapy: [facility] for tasks assessed/performed ?Overall Cognitive Status: Within Functional Limits for tasks assessed ?  ?  ?  ?  ?  ?  ?  ?  ?  ?  ?  ?  ?  ?  ?  ?  ?  ?  ?  ?General Comments  SITTING: BP 128/71, MAP 82, HR 90. STANDING: BP 78/59, MAP 67, HR 104. SITTING: BP 136/64, MAP 83, HR 91 ? ?  ?   ?   ? ? ?Home Living Family/patient expects to be discharged to:: Private residence ?Living Arrangements: Alone ?Available Help at Discharge: Family;Friend(s);Neighbor;Available PRN/intermittently ?Type of Home: Apartment ?Home Access: Level entry ?  ?  ?Home Layout: One level ?  ?  ?Bathroom Shower/Tub: Tub/shower unit ?  ?Bathroom Toilet: Standard ?  ?  ?Home Equipment: International aid/development worker (2 wheels);Rollator (4 wheels) ?  ?  ?  ? ?  ?Prior Functioning/Environment Prior Level of Function : Independent/Modified Independent ?  ?  ?  ?  ?  ?  ?Mobility Comments: reports falls hx using 4WW in home ?ADLs Comments: reports cooking ?  ? ?  ?  ?OT Problem List: Decreased strength;Decreased activity tolerance;Impaired balance (sitting and/or standing);Decreased safety awareness ?  ?   ?OT Treatment/Interventions: Self-care/ADL training;Therapeutic exercise;Energy conservation;DME and/or AE instruction;Therapeutic activities;Patient/family education;Balance training  ?  ?OT Goals(Current goals can be found in the care plan section) Acute Rehab OT Goals ?Patient Stated Goal: to go home ?OT Goal Formulation: With patient/family ?Time For Goal Achievement: 08/29/21 ?Potential to Achieve Goals: Good ?ADL Goals ?Pt Will Perform Grooming: with modified independence;standing ?Pt Will Perform Lower Body Dressing: with modified independence;sit to/from stand ?Pt Will Transfer to Toilet: with modified independence;ambulating;regular height toilet  ?OT Frequency: Min 2X/week ?  ? ?Co-evaluation PT/OT/SLP Co-Evaluation/Treatment: Yes ?Reason for  Co-Treatment: For patient/therapist safety;To address functional/ADL transfers ?PT goals addressed during session: Mobility/safety with mobility ?OT goals addressed during session: ADL's and self-care ?  ? ?  ?AM-PAC OT "6 Clicks" Daily Activity     ?Outcome Measure Help from another person eating meals?: None ?Help from another person taking care of personal grooming?: A Lot ?Help from another person toileting, which includes using toliet, bedpan, or urinal?: A Lot ?Help from another person bathing (including washing, rinsing, drying)?: A Lot ?Help from another person to put on and taking off regular upper body clothing?: A Little ?Help from another person to put on and taking off regular lower body clothing?: A Little ?6 Click Score: 16 ?  ?End of Session Equipment Utilized During Treatment: Rolling walker (2 wheels) ?Nurse Communication: Mobility status ? ?Activity Tolerance: Patient tolerated treatment well;Treatment limited secondary to medical complications (Comment) ?Patient left: in bed;with call bell/phone within reach;with bed alarm set;with family/visitor present ? ?OT Visit Diagnosis: Other abnormalities of gait and mobility (R26.89);Repeated falls (R29.6);Muscle weakness (generalized) (M62.81)  ?              ?Time: 8242-3536 ?OT Time Calculation (min): 24 min ?Charges:  OT General Charges ?$OT Visit: 1 Visit ?OT Evaluation ?$OT Eval Moderate Complexity: 1 Mod ?OT Treatments ?$Self Care/Home  Management : 8-22 mins ? ?Dessie Coma, M.S. OTR/L  ?08/15/21, 4:19 PM  ?ascom [phone] ? ?

## 2021-08-15 NOTE — Consult Note (Signed)
PHARMACY CONSULT NOTE - FOLLOW UP ? ?Pharmacy Consult for Electrolyte Monitoring and Replacement  ? ?Recent Labs: ?Potassium (mmol/L)  ?Date Value  ?08/15/2021 3.7  ? ?Magnesium (mg/dL)  ?Date Value  ?08/15/2021 1.9  ? ?Calcium (mg/dL)  ?Date Value  ?08/15/2021 6.6 (L)  ? ?Albumin (g/dL)  ?Date Value  ?08/14/2021 3.2 (L)  ? ?Phosphorus (mg/dL)  ?Date Value  ?08/15/2021 3.3  ? ?Sodium (mmol/L)  ?Date Value  ?08/15/2021 140  ?06/27/2021 141  ? ?Corrected Calcium: 6.94 > 7.24 ? ?Assessment: ?Pharmacy has been consulted to monitor and replace electrolytes in 81yo female admitted via EMS with new onset tremors.  ? ?Goal of Therapy:  ?Electrolytes WNL, K>=4, Mg >=2 ? ?Plan:  ?Hypocalcemia ?Continue with IV calcium gluconate 2 gram x 1 ordered by provider ?KCL 40 mEq PO x 1 ordered by provider ?IV Magnesium 2 gram  ?Recheck electrolyte with AM labs  ? ? ?Dorothe Pea, PharmD, BCPS ?Clinical Pharmacist   ?08/15/2021 7:48 AM ? ?

## 2021-08-15 NOTE — Plan of Care (Signed)
?  Problem: Education: ?Goal: Knowledge of General Education information will improve ?Description: Including pain rating scale, medication(s)/side effects and non-pharmacologic comfort measures ?08/15/2021 0313 by Hollie Salk, RN ?Outcome: Progressing ?08/15/2021 0313 by Hollie Salk, RN ?Outcome: Progressing ?  ?Problem: Health Behavior/Discharge Planning: ?Goal: Ability to manage health-related needs will improve ?08/15/2021 0313 by Hollie Salk, RN ?Outcome: Progressing ?08/15/2021 0313 by Hollie Salk, RN ?Outcome: Progressing ?  ?Problem: Clinical Measurements: ?Goal: Ability to maintain clinical measurements within normal limits will improve ?08/15/2021 0313 by Hollie Salk, RN ?Outcome: Progressing ?08/15/2021 0313 by Hollie Salk, RN ?Outcome: Progressing ?Goal: Will remain free from infection ?08/15/2021 0313 by Hollie Salk, RN ?Outcome: Progressing ?08/15/2021 0313 by Hollie Salk, RN ?Outcome: Progressing ?Goal: Diagnostic test results will improve ?08/15/2021 0313 by Hollie Salk, RN ?Outcome: Progressing ?08/15/2021 0313 by Hollie Salk, RN ?Outcome: Progressing ?Goal: Respiratory complications will improve ?08/15/2021 0313 by Hollie Salk, RN ?Outcome: Progressing ?08/15/2021 0313 by Hollie Salk, RN ?Outcome: Progressing ?Goal: Cardiovascular complication will be avoided ?08/15/2021 0313 by Hollie Salk, RN ?Outcome: Progressing ?08/15/2021 0313 by Hollie Salk, RN ?Outcome: Progressing ?  ?Problem: Activity: ?Goal: Risk for activity intolerance will decrease ?08/15/2021 0313 by Hollie Salk, RN ?Outcome: Progressing ?08/15/2021 0313 by Hollie Salk, RN ?Outcome: Progressing ?  ?Problem: Nutrition: ?Goal: Adequate nutrition will be maintained ?08/15/2021 0313 by Hollie Salk, RN ?Outcome: Progressing ?08/15/2021 0313 by Hollie Salk, RN ?Outcome: Progressing ?  ?Problem: Coping: ?Goal: Level of anxiety will  decrease ?08/15/2021 0313 by Hollie Salk, RN ?Outcome: Progressing ?08/15/2021 0313 by Hollie Salk, RN ?Outcome: Progressing ?  ?Problem: Elimination: ?Goal: Will not experience complications related to bowel motility ?08/15/2021 0313 by Hollie Salk, RN ?Outcome: Progressing ?08/15/2021 0313 by Hollie Salk, RN ?Outcome: Progressing ?Goal: Will not experience complications related to urinary retention ?08/15/2021 0313 by Hollie Salk, RN ?Outcome: Progressing ?08/15/2021 0313 by Hollie Salk, RN ?Outcome: Progressing ?  ?Problem: Pain Managment: ?Goal: General experience of comfort will improve ?08/15/2021 0313 by Hollie Salk, RN ?Outcome: Progressing ?08/15/2021 0313 by Hollie Salk, RN ?Outcome: Progressing ?  ?Problem: Safety: ?Goal: Ability to remain free from injury will improve ?08/15/2021 0313 by Hollie Salk, RN ?Outcome: Progressing ?08/15/2021 0313 by Hollie Salk, RN ?Outcome: Progressing ?  ?Problem: Skin Integrity: ?Goal: Risk for impaired skin integrity will decrease ?08/15/2021 0313 by Hollie Salk, RN ?Outcome: Progressing ?08/15/2021 0313 by Hollie Salk, RN ?Outcome: Progressing ?  ?

## 2021-08-15 NOTE — Progress Notes (Signed)
?PROGRESS NOTE ? ? ? ?CURLEY FAYETTE  FYB:[id number] DOB: 10-Jan-1941 DOA: 08/14/2021 ?PCP: Juluis Pitch, MD  ? ? ?Brief Narrative:  ?81 y.o. 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 ?  ?Patient states that she has whole body shaking/tremor for almost a week. It is very difficult for her to control her body shaking.  She has history of stroke with mild left-sided, no new unilateral numbness or weakness.  No facial droop or slurred speech.  No difficulty swallowing.  No hearing loss or vision loss. She states that recently she has nausea, poor appetite and decreased oral intake.  No active vomiting, diarrhea.  No abdominal pain.  Patient has mild dry cough, and mild shortness of breath due to COPD, which has not changed.  No chest pain, fever or chills.  No symptoms of UTI.  Patient has bilateral lower leg edema, left leg is worse than the right.  She has mild erythema and mild tenderness in the left lower extremity and foot. She has small diabetic ulcers in left second toe and right third toe, no pain, erythema or drainage. ? ? ?Assessment & Plan: ?  ?Principal Problem: ?  Tremor ?Active Problems: ?  Disorder of electrolytes ?  Hypocalcemia ?  Hypomagnesemia ?  Diabetes mellitus without complication ([place]) ?  Essential hypertension ?  Iron deficiency anemia ?  Depression with anxiety ?  Tobacco abuse ?  Stroke [facility]) ?  Chronic combined systolic and diastolic heart failure ([place]) ?  HLD (hyperlipidemia) ?  COPD (chronic obstructive pulmonary disease) ([place]) ?  Cellulitis of left lower extremity ?  Sepsis ([place]) ?  UTI (urinary tract infection) ? ?Tremor and shaking ? Likely due to severe electrolyte disturbance.   ?Patient has a magnesium less than 0.5.   Calcium 6.3.  Phosphorus 4.8.  Potassium 3.7.  Possibly due to poor oral intake.  Renal function is normal.  Low suspicions for seizure.   ?CT head negative. ?Physical exam improving over interval ? Plan: ?Admit inpatient ?Aggressive replacement of electrolytes ?  ?Disorder of electrolytes, hypocalcemia and hypomagnesemia ? Magnesium less than 0.5, calcium 6.3.  Potassium 3.7.  Phosphorus 4.8. ?Plan: ?Calcium gluconate 2 g x 1 ?Magnesium sulfate 2 g x 1 ?Pharmacy consult for management of electrolyte disturbance ? ?Sepsis due to cellulitis of left lower extremity and UTI ? Patient meets criteria for sepsis with WBC 13.6, heart rate 92, RR 26.  Lactic acid normal. ?Plan: ?Continue IV Rocephin ?Follow blood and urine cultures ?Monitor vitals and fever curve ?Wound consult for toe ulcer ?  ?Diabetes mellitus without complication ([place]) ? Recent A1c 6.2, well controlled.   ?Patient is taking [place] and metformin ?-Hold oral agents ?-Sliding-scale coverage ?  ?Essential hypertension ?-IV hydralazine as needed ?-Coreg, Cozaar ?  ?Iron deficiency anemia ? Hemoglobin stable 10.1 ? ?  ?Depression with anxiety ?-Continue home medications ?  ?Tobacco abuse ?-Nicotine patch ?  ?Stroke [facility]) ?-Aspirin, Lipitor, Plavix ?  ?Chronic combined systolic and diastolic heart failure ([place]) ? Patient has asymmetric leg edema, BNP slightly elevated 361, patient has mild shortness of breath, no pulm edema chest x-ray.  Does not seem to have CHF exacerbation. ?-Hold Lasix due to severe electrolyte disturbance ?  ?HLD (hyperlipidemia) ?-Lipitor ?  ?COPD (chronic obstructive pulmonary disease) ([place]): Stable ?-Bronchodilators ?  ?  DVT prophylaxis: SQ Lovenox ?Code Status: DNR ?Family Communication: Sister at bedside 3/27 ?Disposition Plan: Status is: Inpatient ?Remains inpatient appropriate because: Severe electrolyte disturbance.  Sepsis secondary to cellulitis and UTI on IV antibiotics.  Possible discharge in 24 hours ? ? ?Level of care:  Telemetry Medical ? ?Consultants:  ?None ? ?Procedures:  ?None ? ?Antimicrobials: ?Ceftriaxone ? ? ?Subjective: ?Seen and examined.  Resting in bed.  No visible distress.  Tremors improved. ? ?Objective: ?Vitals:  ? 08/15/21 0426 08/15/21 0500 08/15/21 0813 08/15/21 1249  ?BP: (!) 116/97  (!) 111/91 (!) 114/58  ?Pulse: 94  (!) 105 94  ?Resp: 18  18   ?Temp: 97.8 ?F (36.6 ?C)  98.4 ?F (36.9 ?C) 98.2 ?F (36.8 ?C)  ?TempSrc:    Oral  ?SpO2: 93%  95% 95%  ?Weight:  63.7 kg    ? ? ?Intake/Output Summary (Last 24 hours) at 08/15/2021 1317 ?Last data filed at 08/15/2021 1245 ?Gross per 24 hour  ?Intake 1070.21 ml  ?Output 550 ml  ?Net 520.21 ml  ? ?Filed Weights  ? 08/14/21 0944 08/15/21 0500  ?Weight: 64.4 kg 63.7 kg  ? ? ?Examination: ? ?General exam: Appears calm and comfortable  ?Respiratory system: Lungs clear.  No work of breathing.  Room air ?Cardiovascular system: S1-S2, RRR, no murmurs, no pedal edema ?Gastrointestinal system: Soft, NT/ND, normal bowel sounds. ?Central nervous system: Alert and oriented.  Residual left-sided weakness ?Extremities: Symmetric 5 x 5 power.  Left-sided weakness as above ?Skin: No rashes, lesions or ulcers ?Psychiatry: Judgement and insight appear normal. Mood & affect appropriate.  ? ? ? ?Data Reviewed: I have personally reviewed following labs and imaging studies ? ?CBC: ?Recent Labs  ?Lab 08/14/21 ?9811 08/15/21 ?0435  ?WBC 13.6* 7.9  ?NEUTROABS 10.9*  --   ?HGB 10.1* 8.9*  ?HCT 33.6* 30.3*  ?MCV 76.7* 78.3*  ?PLT 504* 438*  ? ?Basic Metabolic Panel: ?Recent Labs  ?Lab 08/14/21 ?9147 08/14/21 ?1313 08/14/21 ?8295 08/15/21 ?0435  ?NA 140 139  --  140  ?K 3.7 3.2*  --  3.7  ?CL 97* 100  --  106  ?CO2 26 28  --  26  ?GLUCOSE 121* 139*  --  102*  ?BUN 18 17  --  13  ?CREATININE 0.88 0.79  --  0.60  ?CALCIUM 6.3* 7.5*  --  6.6*  ?MG <0.5*  --  2.1 1.9  ?PHOS 4.8*  --   --  3.3  ? ?GFR: ?Estimated Creatinine Clearance: 48.3 mL/min (by C-G formula based on SCr of 0.6 mg/dL). ?Liver Function  Tests: ?Recent Labs  ?Lab 08/14/21 ?0938  ?AST 26  ?ALT 16  ?ALKPHOS 147*  ?BILITOT 0.8  ?PROT 7.1  ?ALBUMIN 3.2*  ? ?No results for input(s): LIPASE, AMYLASE in the last 168 hours. ?Recent Labs  ?Lab 08/14/21 ?6213  ?AMMONIA 22  ? ?Coagulation Profile: ?No results for input(s): INR, PROTIME in the last 168 hours. ?Cardiac Enzymes: ?No results for input(s): CKTOTAL, CKMB, CKMBINDEX, TROPONINI in the last 168 hours. ?BNP (last 3 results) ?No results for input(s): PROBNP in the last 8760 hours. ?HbA1C: ?No results for input(s): HGBA1C in the last 72 hours. ?CBG: ?Recent Labs  ?Lab 08/14/21 ?0941 08/14/21 ?1629 08/14/21 ?2200 08/15/21 ?0830 08/15/21 ?1219  ?GLUCAP 133* 118* 125* 124* 114*  ? ?Lipid Profile: ?No results for input(s): CHOL, HDL, LDLCALC, TRIG, CHOLHDL, LDLDIRECT in the last 72 hours. ?Thyroid Function Tests: ?No results for input(s): TSH, T4TOTAL, FREET4, T3FREE, THYROIDAB in the last  72 hours. ?Anemia Panel: ?No results for input(s): VITAMINB12, FOLATE, FERRITIN, TIBC, IRON, RETICCTPCT in the last 72 hours. ?Sepsis Labs: ?Recent Labs  ?Lab 08/14/21 ?1312 08/14/21 ?1814  ?PROCALCITON <0.10  --   ?LATICACIDVEN 1.0 1.2  ? ? ?Recent Results (from the past 240 hour(s))  ?Culture, blood (x 2)     Status: None (Preliminary result)  ? Collection Time: 08/14/21  1:12 PM  ? Specimen: BLOOD  ?Result Value Ref Range Status  ? Specimen Description BLOOD BLOOD LEFT WRIST  Final  ? Special Requests   Final  ?  BOTTLES DRAWN AEROBIC AND ANAEROBIC Blood Culture adequate volume  ? Culture   Final  ?  NO GROWTH < 24 HOURS ?Performed at [facility], [address] [place], [place] [address] ?  ? Report Status PENDING  Incomplete  ?Culture, blood (x 2)     Status: None (Preliminary result)  ? Collection Time: 08/14/21  1:13 PM  ? Specimen: BLOOD  ?Result Value Ref Range Status  ? Specimen Description BLOOD BLOOD RIGHT WRIST  Final  ? Special Requests   Final  ?  BOTTLES DRAWN AEROBIC AND ANAEROBIC Blood Culture  adequate volume  ? Culture   Final  ?  NO GROWTH < 24 HOURS ?Performed at [facility], [address] [place],  [address] ?  ? Report Status PENDING  Incomplete  ?  ? ? ? ? ? ?Radiolog

## 2021-08-16 ENCOUNTER — Telehealth: Payer: Self-pay | Admitting: *Deleted

## 2021-08-16 DIAGNOSIS — R251 Tremor, unspecified: Secondary | ICD-10-CM | POA: Diagnosis not present

## 2021-08-16 DIAGNOSIS — E440 Moderate protein-calorie malnutrition: Secondary | ICD-10-CM | POA: Insufficient documentation

## 2021-08-16 LAB — PHOSPHORUS: Phosphorus: 2.7 mg/dL (ref 2.5–4.6)

## 2021-08-16 LAB — GLUCOSE, CAPILLARY
Glucose-Capillary: 96 mg/dL (ref 70–99)
Glucose-Capillary: 128 mg/dL — ABNORMAL HIGH (ref 70–99)
Glucose-Capillary: 166 mg/dL — ABNORMAL HIGH (ref 70–99)
Glucose-Capillary: 116 mg/dL — ABNORMAL HIGH (ref 70–99)

## 2021-08-16 LAB — BASIC METABOLIC PANEL
Anion gap: 6 (ref 5–15)
BUN: 9 mg/dL (ref 8–23)
CO2: 25 mmol/L (ref 22–32)
Calcium: 7.3 mg/dL — ABNORMAL LOW (ref 8.9–10.3)
Chloride: 109 mmol/L (ref 98–111)
Creatinine, Ser: 0.54 mg/dL (ref 0.44–1.00)
GFR, Estimated: >60 mL/min (ref >60)
Glucose, Bld: 98 mg/dL (ref 70–99)
Potassium: 4.1 mmol/L (ref 3.5–5.1)
Sodium: 140 mmol/L (ref 135–145)

## 2021-08-16 LAB — CALCIUM, IONIZED: Calcium, Ionized, Serum: 4.4 mg/dL — ABNORMAL LOW (ref 4.5–5.6)

## 2021-08-16 LAB — MAGNESIUM: Magnesium: 2 mg/dL (ref 1.7–2.4)

## 2021-08-16 MED ORDER — ENSURE ENLIVE PO LIQD
237.0000 mL | Freq: Two times a day (BID) | ORAL | Status: DC
  Administered 2021-08-16 – 2021-08-17 (×3): 237 mL via ORAL

## 2021-08-16 MED ORDER — FUROSEMIDE 20 MG PO TABS
20.0000 mg | ORAL_TABLET | Freq: Every day | ORAL | Status: DC
  Administered 2021-08-16 – 2021-08-18 (×3): 20 mg via ORAL
  Filled 2021-08-16 (×3): qty 1

## 2021-08-16 MED ORDER — ALPRAZOLAM 0.25 MG PO TABS
0.2500 mg | ORAL_TABLET | Freq: Two times a day (BID) | ORAL | Status: DC | PRN
  Administered 2021-08-16 – 2021-08-17 (×2): 0.25 mg via ORAL
  Filled 2021-08-16 (×2): qty 1

## 2021-08-16 MED ORDER — POTASSIUM CHLORIDE CRYS ER 20 MEQ PO TBCR
20.0000 meq | EXTENDED_RELEASE_TABLET | Freq: Once | ORAL | Status: AC
  Administered 2021-08-16: 20 meq via ORAL
  Filled 2021-08-16: qty 1

## 2021-08-16 MED ORDER — CALCIUM GLUCONATE-NACL 2-0.675 GM/100ML-% IV SOLN
2.0000 g | Freq: Once | INTRAVENOUS | Status: AC
  Administered 2021-08-16: 2000 mg via INTRAVENOUS
  Filled 2021-08-16: qty 100

## 2021-08-16 MED ORDER — CEPHALEXIN 500 MG PO CAPS
500.0000 mg | ORAL_CAPSULE | Freq: Two times a day (BID) | ORAL | Status: DC
  Administered 2021-08-16 – 2021-08-17 (×3): 500 mg via ORAL
  Filled 2021-08-16 (×3): qty 1

## 2021-08-16 NOTE — Plan of Care (Signed)
?  Problem: Education: ?Goal: Knowledge of General Education information will improve ?Description: Including pain rating scale, medication(s)/side effects and non-pharmacologic comfort measures ?Outcome: Progressing ?  ?Problem: Health Behavior/Discharge Planning: ?Goal: Ability to manage health-related needs will improve ?Outcome: Progressing ?  ?Problem: Clinical Measurements: ?Goal: Ability to maintain clinical measurements within normal limits will improve ?Outcome: Progressing ?Goal: Will remain free from infection ?Outcome: Progressing ?Goal: Diagnostic test results will improve ?Outcome: Progressing ?Goal: Respiratory complications will improve ?Outcome: Progressing ?  ?Problem: Activity: ?Goal: Risk for activity intolerance will decrease ?Outcome: Progressing ?  ?Problem: Nutrition: ?Goal: Adequate nutrition will be maintained ?Outcome: Progressing ?  ?Problem: Coping: ?Goal: Level of anxiety will decrease ?Outcome: Progressing ?  ?Problem: Elimination: ?Goal: Will not experience complications related to bowel motility ?Outcome: Progressing ?  ?Problem: Pain Managment: ?Goal: General experience of comfort will improve ?Outcome: Progressing ?  ?Problem: Safety: ?Goal: Ability to remain free from injury will improve ?Outcome: Progressing ?  ?

## 2021-08-16 NOTE — Progress Notes (Signed)
Initial Nutrition Assessment ? ?DOCUMENTATION CODES:  ? 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 ?INTERVENTION:  ?- Liberalize diet from a heart healthy to a 2gm sodium to provide widest variety of menu options to enhance nutritional adequacy ? ?- Ensure Enlive po BID, each supplement provides 350 kcal and 20 grams of protein. ? ?- MVI with minerals daily ? ?- Added and discussed "High Calorie, High Protein Nutrition Therapy" handout to AVS ? ?NUTRITION DIAGNOSIS:  ? ?Moderate Malnutrition related to chronic illness (stroke, lung CA, CHF) as evidenced by mild fat depletion, moderate muscle depletion, energy intake < 75% for > or equal to 1 month. ? ?GOAL:  ? ?Patient will meet greater than or equal to 90% of their needs ? ?MONITOR:  ? ?PO intake, Supplement acceptance, Diet advancement, Labs, Weight trends ? ?REASON FOR ASSESSMENT:  ? ?Consult ?Assessment of nutrition requirement/status ? ?ASSESSMENT:  ? ?Pt admitted from home with shaking/tremors. PMH significant for HTN, HLD, T2DM, COPD, stroke with mild L sided weakness, depression with anxiety, tobacco abuse, lung CA (s/p radiation therapy), carotid artery stenosis, AAA, anemia, and CHF with EF of 25-40%. ? ?Spoke with pt and her sister at bedside. She reports a recent decrease in appetite. At home she typically has cereal and milk for breakfast and then her next meal is around 5/6PM and consists of a [employer] TV dinner or frozen chicken, pork chops and spaghetti sauce. She denies difficulty chewing/swallowing. Endorses intermittent nausea which is resolved with use of antiemetics. We discussed when she is feeling nauseous, taking antiemetic and then having a small meal/snack once that has taken effect. We also discussed eating smaller meals throughout the day between breakfast and lunch and recommend adding nutrition supplement between meals if unable to eat between this time. They requested information on ways to increase  nutritional content of foods and snacks to incorporate into her day. Added handout on high calorie, high protein food options to AVS. ? ?Meal Completions:  ?(3/26) 70%- dinner ?(3/27) 75%-breakfast, 100%- lunch, 50%-dinner (788kcal, 25g protein) ? ?Pt reports a usual weight of 138 lbs and denies any recent weight loss. Reviewed weight history. Pt noted to have a 3% weight loss within the last 3 months which is not significant for time frame.  ? ?Given pt with decreased PO intake and is not meeting her estimated needs, would recommend diet liberalization to provide more menu options to encourage adequate nutritional intake. ? ?Medication: keflex, lasix, SSI, IV calcium gluconate  ? ?Labs: ionized calcium 4.4(3/26), CBG's 96-128 x 24 hours ? ?NUTRITION - FOCUSED PHYSICAL EXAM: ? ?Flowsheet Row Most Recent Value  ?Orbital Region Mild depletion  ?Upper Arm Region Severe depletion  ?Thoracic and Lumbar Region Mild depletion  ?Buccal Region Mild depletion  ?Temple Region Mild depletion  ?Clavicle Bone Region No depletion  ?Clavicle and Acromion Bone Region No depletion  ?Scapular Bone Region Mild depletion  ?Dorsal Hand Moderate depletion  ?Patellar Region Moderate depletion  ?Anterior Thigh Region Moderate depletion  ?Posterior Calf Region Moderate depletion  ?Edema (RD Assessment) Mild  [non-pitting BLE]  ?Hair Reviewed  ?Eyes Reviewed  ?Mouth Reviewed  ?Skin Reviewed  ?Nails Reviewed  ? ?  ? ?Diet Order:   ?Diet Order   ? ?       ?  Diet 2 gram sodium Room service appropriate? Yes; Fluid consistency: Thin  Diet effective now       ?  ? ?  ?  ? ?  ? ? ?EDUCATION NEEDS:  ? ?  Education needs have been addressed ? ?Skin:  Skin Assessment: Reviewed RN Assessment ? ?Last BM:  3/24 ? ?Height:  ? ?Ht Readings from Last 1 Encounters:  ?06/08/21 5\' 2"  (1.575 m)  ? ? ?Weight:  ? ?Wt Readings from Last 1 Encounters:  ?08/16/21 62.6 kg  ? ?BMI:  Body mass index is 25.24 kg/m?. ? ?Estimated Nutritional Needs:  ? ?Kcal:   1600-1800 ? ?Protein:  80-95g ? ?Fluid:  >/=1.5L ? ?Clayborne Dana, RDN, LDN ?Clinical Nutrition ?

## 2021-08-16 NOTE — Telephone Encounter (Signed)
Sister called and requested apt on 4/5 to be cnl with Dr. Baruch Gouty. Patient currently in the HP. Sister will call back to r/s at a later date. I cnl the apt per her request. ?

## 2021-08-16 NOTE — [place] FL2 (Signed)
? MEDICAID FL2 LEVEL OF CARE SCREENING TOOL  ?  ? ?IDENTIFICATION  ?Patient Name: ?Marissa Fletcher Birthdate: 09/11/1940 Sex: female Admission Date (Current Location): ?08/14/2021  ?[place] and [place] Number: ?  ?  Facility and Address:  ?[facility], [address], [place], [place] [address] ?     Provider Number: ?[id number]  ?Attending Physician Name and Address:  ?Sidney Ace, MD ? Relative Name and Phone Number:  ?Cleona Sister [phone] ?   ?Current Level of Care: ?Hospital Recommended Level of Care: ?[place] Prior Approval Number: ?  ? ?Date Approved/Denied: ?  PASRR Number: ?[id number] ? ?Discharge Plan: ?SNF ?  ? ?Current Diagnoses: ?Patient Active Problem List  ? Diagnosis Date Noted  ? Tremor 08/14/2021  ? Disorder of electrolytes 08/14/2021  ? Hypocalcemia 08/14/2021  ? Hypomagnesemia 08/14/2021  ? Chronic combined systolic and diastolic heart failure ([place]) 08/14/2021  ? HLD (hyperlipidemia) 08/14/2021  ? COPD (chronic obstructive pulmonary disease) ([place]) 08/14/2021  ? Cellulitis of left lower extremity 08/14/2021  ? Sepsis ([place]) 08/14/2021  ? UTI (urinary tract infection) 08/14/2021  ? Coronary artery disease involving native coronary artery of native heart without angina pectoris 07/13/2021  ? History of stroke 07/13/2021  ? Chronic HFrEF (heart failure with reduced ejection fraction) ([place]) 06/08/2021  ? Aortic atherosclerosis ([place]) 06/08/2021  ? Cardiomyopathy ([place])   ? Acute hypoxemic respiratory failure ([place]) 05/17/2021  ? Stroke [facility]) 05/17/2021  ? Primary adenocarcinoma of right lung ([place]) 08/17/2020  ? 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 Hyperlipidemia associated with type 2 diabetes mellitus ([place])   ? Back pain 05/10/2020  ? Diabetes mellitus without complication ([place])   ? Essential hypertension   ? Iron deficiency anemia   ? Lung nodule seen on imaging study   ? Depression with anxiety   ? Tobacco abuse    ? ? ?Orientation RESPIRATION BLADDER Height & Weight   ?  ?Self, Time, Situation, Place ? O2 (2 liters) Continent, External catheter Weight: 62.6 kg ?Height:     ?BEHAVIORAL SYMPTOMS/MOOD NEUROLOGICAL BOWEL NUTRITION STATUS  ?    Continent Diet (DC summary)  ?AMBULATORY STATUS COMMUNICATION OF NEEDS Skin   ?Extensive Assist Verbally Normal ?  ?  ?  ?    ?     ?     ? ? ?Personal Care Assistance Level of Assistance  ?Bathing, Feeding, Dressing Bathing Assistance: Limited assistance ?Feeding assistance: Independent ?Dressing Assistance: Limited assistance ?   ? ?Functional Limitations Info  ?    ?  ?   ? ? ?SPECIAL CARE FACTORS FREQUENCY  ?PT (By licensed PT), OT (By licensed OT)   ?  ?PT Frequency: 5 times per week ?OT Frequency: 5 times per week ?  ?  ?  ?   ? ? ?Contractures Contractures Info: Not present  ? ? ?Additional Factors Info  ?Code Status, Allergies Code Status Info: DNR ?Allergies Info: Sulfa Antibiotics ?  ?  ?  ?   ? ?Current Medications (08/16/2021):  This is the current hospital active medication list ?Current Facility-Administered Medications  ?Medication Dose Route Frequency Provider Last Rate Last Admin  ? acetaminophen (TYLENOL) tablet 650 mg  650 mg Oral Q6H PRN Ivor Costa, MD   650 mg at 08/15/21 1025  ? albuterol (PROVENTIL) (2.5 MG/3ML) 0.083% nebulizer solution 2.5 mg  2.5 mg Inhalation Q4H PRN Ivor Costa, MD      ? ALPRAZolam Duanne Moron) tablet 0.25 mg  0.25 mg  Oral BID PRN Ralene Muskrat B, MD      ? aspirin EC tablet 81 mg  81 mg Oral Daily Ivor Costa, MD   81 mg at 08/16/21 6579  ? atorvastatin (LIPITOR) tablet 20 mg  20 mg Oral Q M,W,F Ivor Costa, MD   20 mg at 08/15/21 1004  ? carvedilol (COREG) tablet 6.25 mg  6.25 mg Oral BID WC Ivor Costa, MD   6.25 mg at 08/16/21 0383  ? cephALEXin (KEFLEX) capsule 500 mg  500 mg Oral Q12H Sreenath, Sudheer B, MD   500 mg at 08/16/21 1230  ? clopidogrel (PLAVIX) tablet 75 mg  75 mg Oral Daily Ivor Costa, MD   75 mg at 08/16/21 3383  ?  diphenhydrAMINE (BENADRYL) injection 12.5 mg  12.5 mg Intravenous Q8H PRN Ivor Costa, MD      ? doxepin (SINEQUAN) capsule 50 mg  50 mg Oral QHS Ivor Costa, MD   50 mg at 08/15/21 2153  ? enoxaparin (LOVENOX) injection 40 mg  40 mg Subcutaneous Q24H Ivor Costa, MD   40 mg at 08/15/21 2206  ? feeding supplement (ENSURE ENLIVE / ENSURE PLUS) liquid 237 mL  237 mL Oral BID BM Sreenath, Sudheer B, MD      ? furosemide (LASIX) tablet 20 mg  20 mg Oral Daily Sreenath, Sudheer B, MD   20 mg at 08/16/21 1230  ? gabapentin (NEURONTIN) capsule 300 mg  300 mg Oral QHS Ivor Costa, MD   300 mg at 08/15/21 2152  ? hydrALAZINE (APRESOLINE) injection 5 mg  5 mg Intravenous Q2H PRN Ivor Costa, MD      ? insulin aspart (novoLOG) injection 0-5 Units  0-5 Units Subcutaneous QHS Ivor Costa, MD      ? insulin aspart (novoLOG) injection 0-9 Units  0-9 Units Subcutaneous TID WC Ivor Costa, MD   1 Units at 08/15/21 2919  ? lidocaine (LIDODERM) 5 % 1 patch  1 patch Transdermal Q24H Foust, Katy L, NP   1 patch at 08/16/21 1660  ? lidocaine (LIDODERM) 5 % 1 patch  1 patch Transdermal Q24H Foust, Katy L, NP   1 patch at 08/16/21 6004  ? losartan (COZAAR) tablet 12.5 mg  12.5 mg Oral Daily Ivor Costa, MD   12.5 mg at 08/16/21 5997  ? mupirocin ointment (BACTROBAN) 2 %   Topical BID Ivor Costa, MD   Given at 08/16/21 0549  ? nicotine (NICODERM CQ - dosed in mg/24 hours) patch 21 mg  21 mg Transdermal Daily Ivor Costa, MD   21 mg at 08/16/21 0855  ? oxyCODONE-acetaminophen (PERCOCET/ROXICET) 5-325 MG per tablet 1 tablet  1 tablet Oral Q6H PRN Foust, Katy L, NP   1 tablet at 08/16/21 1233  ? PARoxetine (PAXIL) tablet 30 mg  30 mg Oral Daily Ivor Costa, MD   30 mg at 08/16/21 7414  ? polyethylene glycol (MIRALAX / GLYCOLAX) packet 17 g  17 g Oral Daily PRN Ivor Costa, MD   17 g at 08/15/21 1004  ? ? ? ?Discharge Medications: ?Please see discharge summary for a list of discharge medications. ? ?Relevant Imaging Results: ? ?Relevant Lab  Results: ? ? ?Additional Information ?SS# [id number] ? ?Conception Oms, RN ? ? ? ? ?

## 2021-08-16 NOTE — Discharge Instructions (Signed)
High Calorie, High Protein Nutrition Therapy ? ?A high-calorie, high-protein diet has been recommended to you. Your registered dietitian nutritionist (RDN) may have recommended this diet because you are having difficulty eating enough calories throughout the day, you have lost weight, and/or you need to add protein to your diet. ?Sometimes you may not feel like eating, even if you know the importance of good nutrition. The recommendations in this handout can help you with the following: ?Regaining your strength and energy ?Keeping your body healthy ?Healing and recovering from surgery or illness and fighting infection ? ?Tips ?Schedule Your Meals and Snacks ?Several small meals and snacks are often better tolerated and digested than large meals. ?Strategies ?Plan to eat 3 meals and 3 snacks daily. ?Experiment with timing meals to find out when you have a larger appetite. ?Appetite may be greatest in the morning after not eating all night so you may prefer to eat your larger meals and snacks in the morning and at lunch. ?Breakfast-type foods are often better tolerated so eat foods such as eggs, pancakes, waffles and cereal for any meal or snack. ?Carry snacks with you so you are prepared to eat every 2 to 3 hours. ?Determine what works best for you if your body?s cues for feeling hungry or full are not working. ?Eat a small meal or snack even if you don?t feel hungry. ?Set a timer to remind you when it is time to eat. ?Take a walk before you eat (with health care provider?s approval). ?Light or moderate physical activity can help you maintain muscle and increase your appetite. ? ?Make Eating Enjoyable ?Taking steps to make the experience enjoyable may help to increase your interest in eating and improve your appetite. ?Strategies: ?Eat with others whenever possible. ?Include your favorite foods to make meals more enjoyable. ?Try new foods. ?Save your beverage for the end of the meal so that you have more room for  food before you get full. ? ?Add Calories to Your Meals and Snacks ?Try adding calorie-dense foods so that each bite provides more nutrition. ?Strategies ?Drink milk, chocolate milk, soy milk, or smoothies instead of low-calorie beverages such as diet drinks or water. ?Cook with milk or soy milk instead of water when making dishes such as hot cereal, cocoa, or pudding. ?Add jelly, jam, honey, butter or margarine to bread and crackers. Add jam or fruit to ice cream and as a topping over cake. ?Mix dried fruit, nuts, granola, honey, or dry cereal with yogurt or hot cereals. ?Enjoy snacks such as milkshakes, smoothies, pudding, ice cream, or custard. ?Blend a fruit smoothie of a banana, frozen berries, milk or soy milk, and 1 tablespoon nonfat powdered milk or protein powder. ? ?Add Protein to Your Meals and Snacks ?Choose at least one protein food at each meal and snack to increase your daily intake. ?Strategies ?Add ? cup nonfat dry milk powder or protein powder to make a high-protein milk to drink or to use in recipes that call for milk. Vanilla or peppermint extract or unsweetened cocoa powder could help to boost the flavor. ?Add hard-cooked eggs, leftover meat, grated cheese, canned beans or tofu to noodles, rice, salads, sandwiches, soups, casseroles, pasta, tuna and other mixed dishes. ?Add powdered milk or protein powder to hot cereals, meatloaf, casseroles, scrambled eggs, sauces, cream soups, and shakes. ?Add beans and lentils to salads, soups, casseroles, and vegetable dishes. ?Eat cottage cheese or yogurt, especially [place] yogurt, with fruit as a snack or dessert. ?Eat peanut or other  nut butters on crackers, bread, toast, waffles, apples, bananas or celery sticks. Add it to milkshakes, smoothies, or desserts. ?Consider a ready-made protein shake. Your RDN will make recommendations. ? ?Add Fats to Your Meals and Snacks ?Try adding fats to your meals and snacks. Fat provides more calories in fewer bites than  carbohydrate or protein and adds flavors to your foods. ?Strategies ?Snack on nuts and seeds or add them to foods like salads, pasta, cereals, yogurt, and ice cream.  ?Saut? or stir-fry vegetables, meats, chicken, fish or tofu in olive or canola oil.  ?Add olive oil, other vegetable oils, butter or margarine to soups, vegetables, potatoes, cooked cereal, rice, pasta, bread, crackers, pancakes, or waffles. ?Snack on olives or add to pasta, pizza, or salad. ?Add avocado or guacamole to your salads, sandwiches, and other entrees. ?Include fatty fish such as salmon in your weekly meal plan. ?For general food safety tips, especially for clients with immunocompromised conditions, ask your RDN for the Food Safety Nutrition Therapy handout. ? ?Small Meal and Snack Ideas ?These snacks and meals are recommended when you have to eat but aren?t necessarily hungry.  They are good choices because they are high in protein and high in calories.  ? ?2 graham crackers ?2 tablespoons peanut or other nut butter ?1 cup milk 2 slices whole wheat toast topped with: ?? avocado, mashed ?Seasoning of your choice  ?? cup [place] yogurt ?? cup fruit ?? cup granola 2 deviled egg halves ?5 whole wheat crackers  ?1 cup cream of tomato soup ?? grilled cheese sandwich 1 toasted waffle topped with: ?2 tablespoons peanut or nut butter ?1 tablespoon jam  ?Trail mix made with: ? cup nuts ?? cup dried fruit ?? cup cold cereal, any variety ? cup oatmeal or cream of wheat cereal ?1 tablespoon peanut or nut butter ?? cup diced fruit  ? ?

## 2021-08-16 NOTE — Progress Notes (Signed)
Occupational Therapy Treatment ?Patient Details ?Name: Marissa Fletcher ?MRN: [id number] ?DOB: 1940-08-09 ?Today's Date: 08/16/2021 ? ? ?History of present illness Marissa Fletcher is a 81 y.o. 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 ?  ?OT comments ? Marissa Fletcher was seen for OT treatment on this date. Upon arrival to room pt reclined in bed, family at bedside, pt reports has not attempted OOB since last dates therapy session. Pt requires MOD A + RW for sit<>stand and bed>chair - posterior lean in standing noted, improves with cues and time. MIN A don briefs sitting/standing. Pt making good progress toward goals. Pt continues to benefit from skilled OT services to maximize return to PLOF and minimize risk of future falls, injury, caregiver burden, and readmission. Will continue to follow POC. Discharge recommendation remains appropriate.  ? ?STANDING: BP 155/77 ?STANDING 3': BP 126/67 + DIZZINESS  ? ?Recommendations for follow up therapy are one component of a multi-disciplinary discharge planning process, led by the attending physician.  Recommendations may be updated based on patient status, additional functional criteria and insurance authorization. ?   ?Follow Up Recommendations ? Skilled nursing-short term rehab (<3 hours/day)  ?  ?Assistance Recommended at Discharge Intermittent Supervision/Assistance  ?Patient can return home with the following ? A lot of help with walking and/or transfers;A lot of help with bathing/dressing/bathroom;Help with stairs or ramp for entrance ?  ?Equipment Recommendations ? None recommended by OT  ?  ?Recommendations for Other Services   ? ?  ?Precautions / Restrictions Precautions ?Precautions:  Fall ?Restrictions ?Weight Bearing Restrictions: No  ? ? ?  ? ?Mobility Bed Mobility ?Overal bed mobility: Needs Assistance ?Bed Mobility: Sit to Supine ?  ?  ?Supine to sit: Supervision ?  ?  ?  ?  ? ?Transfers ?Overall transfer level: Needs assistance ?Equipment used: Rolling walker (2 wheels) ?Transfers: Sit to/from Stand ?Sit to Stand: Mod assist ?  ?  ?  ?  ?  ?  ?  ?  ?Balance Overall balance assessment: Needs assistance ?Sitting-balance support: No upper extremity supported, Feet supported ?Sitting balance-Leahy Scale: Fair ?  ?  ?Standing balance support: Reliant on assistive device for balance, During functional activity, Bilateral upper extremity supported ?Standing balance-Leahy Scale: Poor ?Standing balance comment: posterior lean ?  ?  ?  ?  ?  ?  ?  ?  ?  ?  ?  ?   ? ?ADL either performed or assessed with clinical judgement  ? ?ADL Overall ADL's : Needs assistance/impaired ?  ?  ?  ?  ?  ?  ?  ?  ?  ?  ?  ?  ?  ?  ?  ?  ?  ?  ?  ?General ADL Comments: MOD A + RW for ADL t/f. SUPERVISION dynamic sitting tasks. MIN A don briefs sitting/standing ?  ? ? ? ?Cognition Arousal/Alertness: Awake/alert ?Behavior During Therapy: [facility] for tasks assessed/performed ?Overall Cognitive Status: Within Functional Limits for tasks assessed ?  ?  ?  ?  ?  ?  ?  ?  ?  ?  ?  ?  ?  ?  ?  ?  ?  ?  ?  ?   ?   ?   ?   ? ? ?Pertinent Vitals/ Pain  Pain Assessment ?Pain Assessment: No/denies pain ? ? ?Frequency ? Min 2X/week  ? ? ? ? ?  ?Progress Toward Goals ? ?OT Goals(current goals can now be found in the care plan section) ? Progress towards OT goals: Progressing toward goals ? ?Acute Rehab OT Goals ?Patient Stated Goal: to go home ?OT Goal Formulation: With patient/family ?Time For Goal Achievement: 08/29/21 ?Potential to Achieve Goals: Good ?ADL Goals ?Pt Will Perform Grooming: with modified independence;standing ?Pt Will Perform Lower Body Dressing: with modified independence;sit to/from stand ?Pt Will Transfer to  Toilet: with modified independence;ambulating;regular height toilet  ?Plan Discharge plan remains appropriate;Frequency remains appropriate   ? ?Co-evaluation ? ? ?   ?  ?  ?  ?  ? ?  ?AM-PAC OT "6 Clicks" Daily Activity     ?Outcome Measure ? ? Help from another person eating meals?: None ?Help from another person taking care of personal grooming?: A Lot ?Help from another person toileting, which includes using toliet, bedpan, or urinal?: A Lot ?Help from another person bathing (including washing, rinsing, drying)?: A Lot ?Help from another person to put on and taking off regular upper body clothing?: A Little ?Help from another person to put on and taking off regular lower body clothing?: A Little ?6 Click Score: 16 ? ?  ?End of Session Equipment Utilized During Treatment: Rolling walker (2 wheels) ? ?OT Visit Diagnosis: Other abnormalities of gait and mobility (R26.89);Repeated falls (R29.6);Muscle weakness (generalized) (M62.81) ?  ?Activity Tolerance Patient tolerated treatment well;Treatment limited secondary to medical complications (Comment) ?  ?Patient Left in chair;with call bell/phone within reach;with chair alarm set;with family/visitor present ?  ?Nurse Communication Mobility status ?  ? ?   ? ?Time: 0034-9179 ?OT Time Calculation (min): 25 min ? ?Charges: OT General Charges ?$OT Visit: 1 Visit ?OT Treatments ?$Self Care/Home Management : 23-37 mins ? ?Dessie Coma, M.S. OTR/L  ?08/16/21, 4:49 PM  ?ascom [phone] ? ?

## 2021-08-16 NOTE — Consult Note (Addendum)
PHARMACY CONSULT NOTE - FOLLOW UP ? ?Pharmacy Consult for Electrolyte Monitoring and Replacement  ? ?Recent Labs: ?Potassium (mmol/L)  ?Date Value  ?08/16/2021 4.1  ? ?Magnesium (mg/dL)  ?Date Value  ?08/16/2021 2.0  ? ?Calcium (mg/dL)  ?Date Value  ?08/16/2021 7.3 (L)  ? ?Albumin (g/dL)  ?Date Value  ?08/14/2021 3.2 (L)  ? ?Phosphorus (mg/dL)  ?Date Value  ?08/16/2021 2.7  ? ?Sodium (mmol/L)  ?Date Value  ?08/16/2021 140  ?06/27/2021 141  ? ?Corrected Calcium: 6.94 > 7.24 >> 7.94 ?Ionized Ca: 4.4 (goal 4.5-5.6) ? ?Assessment: ?Pharmacy has been consulted to monitor and replace electrolytes in 81yo female admitted via EMS with new onset tremors.  ? ?3/28: PO lasix 20 mg daily started (takes 40 mg daily PTA) ? ?Goal of Therapy:  ?Electrolytes WNL, K>=4, Mg >=2 ? ?Plan:  ?Hypocalcemia -- improving. Ionized Ca checked and only slightly low. ?Will order IV calcium gluconate 2 gram x 1 for today ?Anticipate transition to oral Ca replacement 08/17/21 if continued replacement warranted  ?Hypokalemia:  ?Will give 20 mEq PO KCL x 1 given PO lasix resumed ?Consider discharging patient on daily Ca, K, Mag supplements if chronic diuretic therapy continued  ?Recheck electrolyte with AM labs  ? ? ?Dorothe Pea, PharmD, BCPS ?Clinical Pharmacist   ?08/16/2021 9:35 AM ? ?

## 2021-08-16 NOTE — TOC Progression Note (Signed)
Transition of Care (TOC) - Progression Note  ? ? ?Patient Details  ?Name: Marissa Fletcher ?MRN: [id number] ?Date of Birth: 29-May-1940 ? ?Transition of Care (TOC) CM/SW Contact  ?Conception Oms, RN ?Phone Number: ?08/16/2021, 2:07 PM ? ?Clinical Narrative:   Met with the patient in the room, she lives at home alone and is agreeable to a bedsearch to go to SNF short term, PASSR obtained Bedsearch sent Fl2 completed, will review the bed offers once obtained ? ? ? ?  ?  ? ?Expected Discharge Plan and Services ?  ?  ?  ?  ?  ?                ?  ?  ?  ?  ?  ?  ?  ?  ?  ?  ? ? ?Social Determinants of Health (SDOH) Interventions ?  ? ?Readmission Risk Interventions ? ?  08/15/2021  ?  3:40 PM  ?Readmission Risk Prevention Plan  ?Transportation Screening Complete  ?PCP or Specialist Appt within 3-5 Days Complete  ?[place] or Home Care Consult Complete  ?Palliative Care Screening Not Applicable  ?Medication Review [employer] photographer) Referral to Pharmacy  ? ? ?

## 2021-08-16 NOTE — Plan of Care (Signed)

## 2021-08-16 NOTE — Progress Notes (Signed)
?PROGRESS NOTE ? ? ? ?Marissa Fletcher  SWF:[id number] DOB: [birth year] DOA: 08/14/2021 ?PCP: Juluis Pitch, MD  ? ? ?Brief Narrative:  ?81 y.o. 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 ?  ?Patient states that she has whole body shaking/tremor for almost a week. It is very difficult for her to control her body shaking.  She has history of stroke with mild left-sided, no new unilateral numbness or weakness.  No facial droop or slurred speech.  No difficulty swallowing.  No hearing loss or vision loss. She states that recently she has nausea, poor appetite and decreased oral intake.  No active vomiting, diarrhea.  No abdominal pain.  Patient has mild dry cough, and mild shortness of breath due to COPD, which has not changed.  No chest pain, fever or chills.  No symptoms of UTI.  Patient has bilateral lower leg edema, left leg is worse than the right.  She has mild erythema and mild tenderness in the left lower extremity and foot. She has small diabetic ulcers in left second toe and right third toe, no pain, erythema or drainage. ? ?Multiple severe electrolyte abnormalities.  Corrected aggressively.  Symptoms have improved ? ? ?Assessment & Plan: ?  ?Principal Problem: ?  Tremor ?Active Problems: ?  Disorder of electrolytes ?  Hypocalcemia ?  Hypomagnesemia ?  Diabetes mellitus without complication ([place]) ?  Essential hypertension ?  Iron deficiency anemia ?  Depression with anxiety ?  Tobacco abuse ?  Stroke [facility]) ?  Chronic combined systolic and diastolic heart failure ([place]) ?  HLD (hyperlipidemia) ?  COPD (chronic obstructive pulmonary disease) ([place]) ?  Cellulitis of left lower extremity ?  Sepsis ([place]) ?  UTI (urinary tract infection) ?  Malnutrition  of moderate degree ? ?Tremor and shaking ? Likely due to severe electrolyte disturbance.   ?Patient has a magnesium less than 0.5.  Calcium 6.3.  Phosphorus 4.8.  Potassium 3.7.  Possibly due to poor oral intake.  Renal function is normal.  Low suspicions for seizure.   ?CT head negative. ?Physical exam improving over interval ? Plan: ?Continue monitoring and aggressive replacement of electrolytes ?  ?Disorder of electrolytes, hypocalcemia and hypomagnesemia ?Magnesium, calcium, phosphorus, potassium all low ?Suspect combination of dietary deficiencies and daily loop diuretic ?All indices improving over interval ?Plan: ?Monitor and replace electrolytes as needed ?Pharmacy consulted for dosing recommendations ? ?Sepsis due to cellulitis of left lower extremity and UTI ? Patient meets criteria for sepsis with WBC 13.6, heart rate 92, RR 26.  Lactic acid normal. ?Sepsis physiology improving ?Plan: ?DC IV Rocephin ?Start p.o. Keflex ?Follow blood and urine cultures, no growth to date ?Monitor vitals and fever curve, no fevers over interval ?Wound consult for toe ulcer ?  ?Diabetes mellitus without complication ([place]) ? Recent A1c 6.2, well controlled.   ?Patient is taking [place] and metformin ?Plan: ?-Hold oral agents ?-Sliding-scale coverage ?  ?Essential hypertension ?-IV hydralazine as needed ?-Coreg, Cozaar ?  ?Iron deficiency anemia ? Hemoglobin stable 10.1 ? ?  ?Depression with anxiety ?-Continue home medications ?  ?Tobacco abuse ?-Nicotine patch ?  ?Stroke [facility]) ?-Aspirin, Lipitor, Plavix ?  ?Chronic combined systolic and diastolic heart failure ([place]) ? Patient has asymmetric leg edema, BNP slightly elevated 361, patient has mild shortness of breath, no pulm edema  chest x-ray.  Does not seem to have CHF exacerbation. ?-Lasix restarted at 20 mg (half home dose) ?-Suggest discharging on half dose of Lasix ?  ?HLD (hyperlipidemia) ?-Lipitor ?  ?COPD (chronic obstructive pulmonary disease) ([place]):  Stable ?-Bronchodilators ?  ?DVT prophylaxis: SQ Lovenox ?Code Status: DNR ?Family Communication: Sister at bedside 3/27, 3/28 ?Disposition Plan: Status is: Inpatient ?Remains inpatient appropriate because: Severe electrolyte disturbance.  Sepsis secondary to cellulitis and UTI on IV antibiotics.  Approaching medical readiness for discharge.  Therapy is recommended skilled nursing facility.  TOC engaged.  Bed search initiated. ? ? ?Level of care: Med-Surg ? ?Consultants:  ?None ? ?Procedures:  ?None ? ?Antimicrobials: ?Keflex ? ? ?Subjective: ?Seen and examined.  Resting in bed.  No visible distress.  Tremors improved. ? ?Objective: ?Vitals:  ? 08/16/21 0401 08/16/21 0403 08/16/21 0500 08/16/21 0730  ?BP: 124/83   138/71  ?Pulse: 88 92  86  ?Resp: 16   18  ?Temp:    98.1 ?F (36.7 ?C)  ?TempSrc:      ?SpO2: 91% 97%  93%  ?Weight:   62.6 kg   ? ? ?Intake/Output Summary (Last 24 hours) at 08/16/2021 1440 ?Last data filed at 08/15/2021 2322 ?Gross per 24 hour  ?Intake 120 ml  ?Output 350 ml  ?Net -230 ml  ? ?Filed Weights  ? 08/14/21 0944 08/15/21 0500 08/16/21 0500  ?Weight: 64.4 kg 63.7 kg 62.6 kg  ? ? ?Examination: ? ?General exam: No acute distress ?Respiratory system: Normal respiratory effort.  Lungs clear.  Room air ?Cardiovascular system: S1-S2, RRR, no murmurs, no pedal edema ?Gastrointestinal system: Soft, NT/ND, normal bowel sounds. ?Central nervous system: Alert and oriented.  Residual left-sided weakness ?Extremities: Symmetric 5 x 5 power.  Left-sided weakness as above ?Skin: No rashes, lesions or ulcers ?Psychiatry: Judgement and insight appear normal. Mood & affect appropriate.  ? ? ? ?Data Reviewed: I have personally reviewed following labs and imaging studies ? ?CBC: ?Recent Labs  ?Lab 08/14/21 ?8295 08/15/21 ?0435  ?WBC 13.6* 7.9  ?NEUTROABS 10.9*  --   ?HGB 10.1* 8.9*  ?HCT 33.6* 30.3*  ?MCV 76.7* 78.3*  ?PLT 504* 438*  ? ?Basic Metabolic Panel: ?Recent Labs  ?Lab 08/14/21 ?6213 08/14/21 ?1313  08/14/21 ?0865 08/15/21 ?7846 08/16/21 ?0431  ?NA 140 139  --  140 140  ?K 3.7 3.2*  --  3.7 4.1  ?CL 97* 100  --  106 109  ?CO2 26 28  --  26 25  ?GLUCOSE 121* 139*  --  102* 98  ?BUN 18 17  --  13 9  ?CREATININE 0.88 0.79  --  0.60 0.54  ?CALCIUM 6.3* 7.5*  --  6.6* 7.3*  ?MG <0.5*  --  2.1 1.9 2.0  ?PHOS 4.8*  --   --  3.3 2.7  ? ?GFR: ?Estimated Creatinine Clearance: 48 mL/min (by C-G formula based on SCr of 0.54 mg/dL). ?Liver Function Tests: ?Recent Labs  ?Lab 08/14/21 ?0938  ?AST 26  ?ALT 16  ?ALKPHOS 147*  ?BILITOT 0.8  ?PROT 7.1  ?ALBUMIN 3.2*  ? ?No results for input(s): LIPASE, AMYLASE in the last 168 hours. ?Recent Labs  ?Lab 08/14/21 ?9629  ?AMMONIA 22  ? ?Coagulation Profile: ?No results for input(s): INR, PROTIME in the last 168 hours. ?Cardiac Enzymes: ?No results for input(s): CKTOTAL, CKMB, CKMBINDEX, TROPONINI in the last 168 hours. ?BNP (last 3 results) ?No results for input(s): PROBNP in the last 8760 hours. ?HbA1C: ?No results for input(s): HGBA1C in the  last 72 hours. ?CBG: ?Recent Labs  ?Lab 08/15/21 ?1219 08/15/21 ?1647 08/15/21 ?2157 08/16/21 ?5732 08/16/21 ?1221  ?GLUCAP 114* 111* 128* 96 128*  ? ?Lipid Profile: ?No results for input(s): CHOL, HDL, LDLCALC, TRIG, CHOLHDL, LDLDIRECT in the last 72 hours. ?Thyroid Function Tests: ?No results for input(s): TSH, T4TOTAL, FREET4, T3FREE, THYROIDAB in the last 72 hours. ?Anemia Panel: ?No results for input(s): VITAMINB12, FOLATE, FERRITIN, TIBC, IRON, RETICCTPCT in the last 72 hours. ?Sepsis Labs: ?Recent Labs  ?Lab 08/14/21 ?1312 08/14/21 ?1814  ?PROCALCITON <0.10  --   ?LATICACIDVEN 1.0 1.2  ? ? ?Recent Results (from the past 240 hour(s))  ?Culture, blood (x 2)     Status: None (Preliminary result)  ? Collection Time: 08/14/21  1:12 PM  ? Specimen: BLOOD  ?Result Value Ref Range Status  ? Specimen Description BLOOD BLOOD LEFT WRIST  Final  ? Special Requests   Final  ?  BOTTLES DRAWN AEROBIC AND ANAEROBIC Blood Culture adequate volume  ?  Culture   Final  ?  NO GROWTH 2 DAYS ?Performed at [facility], [address], [place], [place] [address] ?  ? Report Status PENDING  Incomplete  ?Culture, blood (x 2)     Status: None (Preliminary result)  ? Collection

## 2021-08-17 ENCOUNTER — Inpatient Hospital Stay: Payer: Medicare Other

## 2021-08-17 DIAGNOSIS — D509 Iron deficiency anemia, unspecified: Secondary | ICD-10-CM

## 2021-08-17 DIAGNOSIS — R251 Tremor, unspecified: Secondary | ICD-10-CM | POA: Diagnosis not present

## 2021-08-17 DIAGNOSIS — E878 Other disorders of electrolyte and fluid balance, not elsewhere classified: Secondary | ICD-10-CM | POA: Diagnosis not present

## 2021-08-17 DIAGNOSIS — A419 Sepsis, unspecified organism: Secondary | ICD-10-CM

## 2021-08-17 DIAGNOSIS — E1169 Type 2 diabetes mellitus with other specified complication: Secondary | ICD-10-CM

## 2021-08-17 DIAGNOSIS — E785 Hyperlipidemia, unspecified: Secondary | ICD-10-CM

## 2021-08-17 LAB — BASIC METABOLIC PANEL
Anion gap: 5 (ref 5–15)
BUN: 8 mg/dL (ref 8–23)
CO2: 25 mmol/L (ref 22–32)
Calcium: 8.3 mg/dL — ABNORMAL LOW (ref 8.9–10.3)
Chloride: 105 mmol/L (ref 98–111)
Creatinine, Ser: 0.48 mg/dL (ref 0.44–1.00)
GFR, Estimated: >60 mL/min (ref >60)
Glucose, Bld: 121 mg/dL — ABNORMAL HIGH (ref 70–99)
Potassium: 4.4 mmol/L (ref 3.5–5.1)
Sodium: 135 mmol/L (ref 135–145)

## 2021-08-17 LAB — GLUCOSE, CAPILLARY
Glucose-Capillary: 108 mg/dL — ABNORMAL HIGH (ref 70–99)
Glucose-Capillary: 144 mg/dL — ABNORMAL HIGH (ref 70–99)
Glucose-Capillary: 160 mg/dL — ABNORMAL HIGH (ref 70–99)
Glucose-Capillary: 123 mg/dL — ABNORMAL HIGH (ref 70–99)

## 2021-08-17 LAB — URINE CULTURE: Culture: >=100000 — AB

## 2021-08-17 LAB — RESP PANEL BY RT-PCR (FLU A&B, COVID) ARPGX2
Influenza A by PCR: NEGATIVE
Influenza B by PCR: NEGATIVE
SARS Coronavirus 2 by RT PCR: NEGATIVE

## 2021-08-17 LAB — MAGNESIUM: Magnesium: 1.8 mg/dL (ref 1.7–2.4)

## 2021-08-17 LAB — CALCIUM, IONIZED: Calcium, Ionized, Serum: 4.3 mg/dL — ABNORMAL LOW (ref 4.5–5.6)

## 2021-08-17 IMAGING — CR DG CHEST 2V
2 series · 2 of 2 positions shown · non-contrast
Comparison: [DATE]

CLINICAL DATA: Wheezing

EXAM:
CHEST - 2 VIEW

[chest lat]
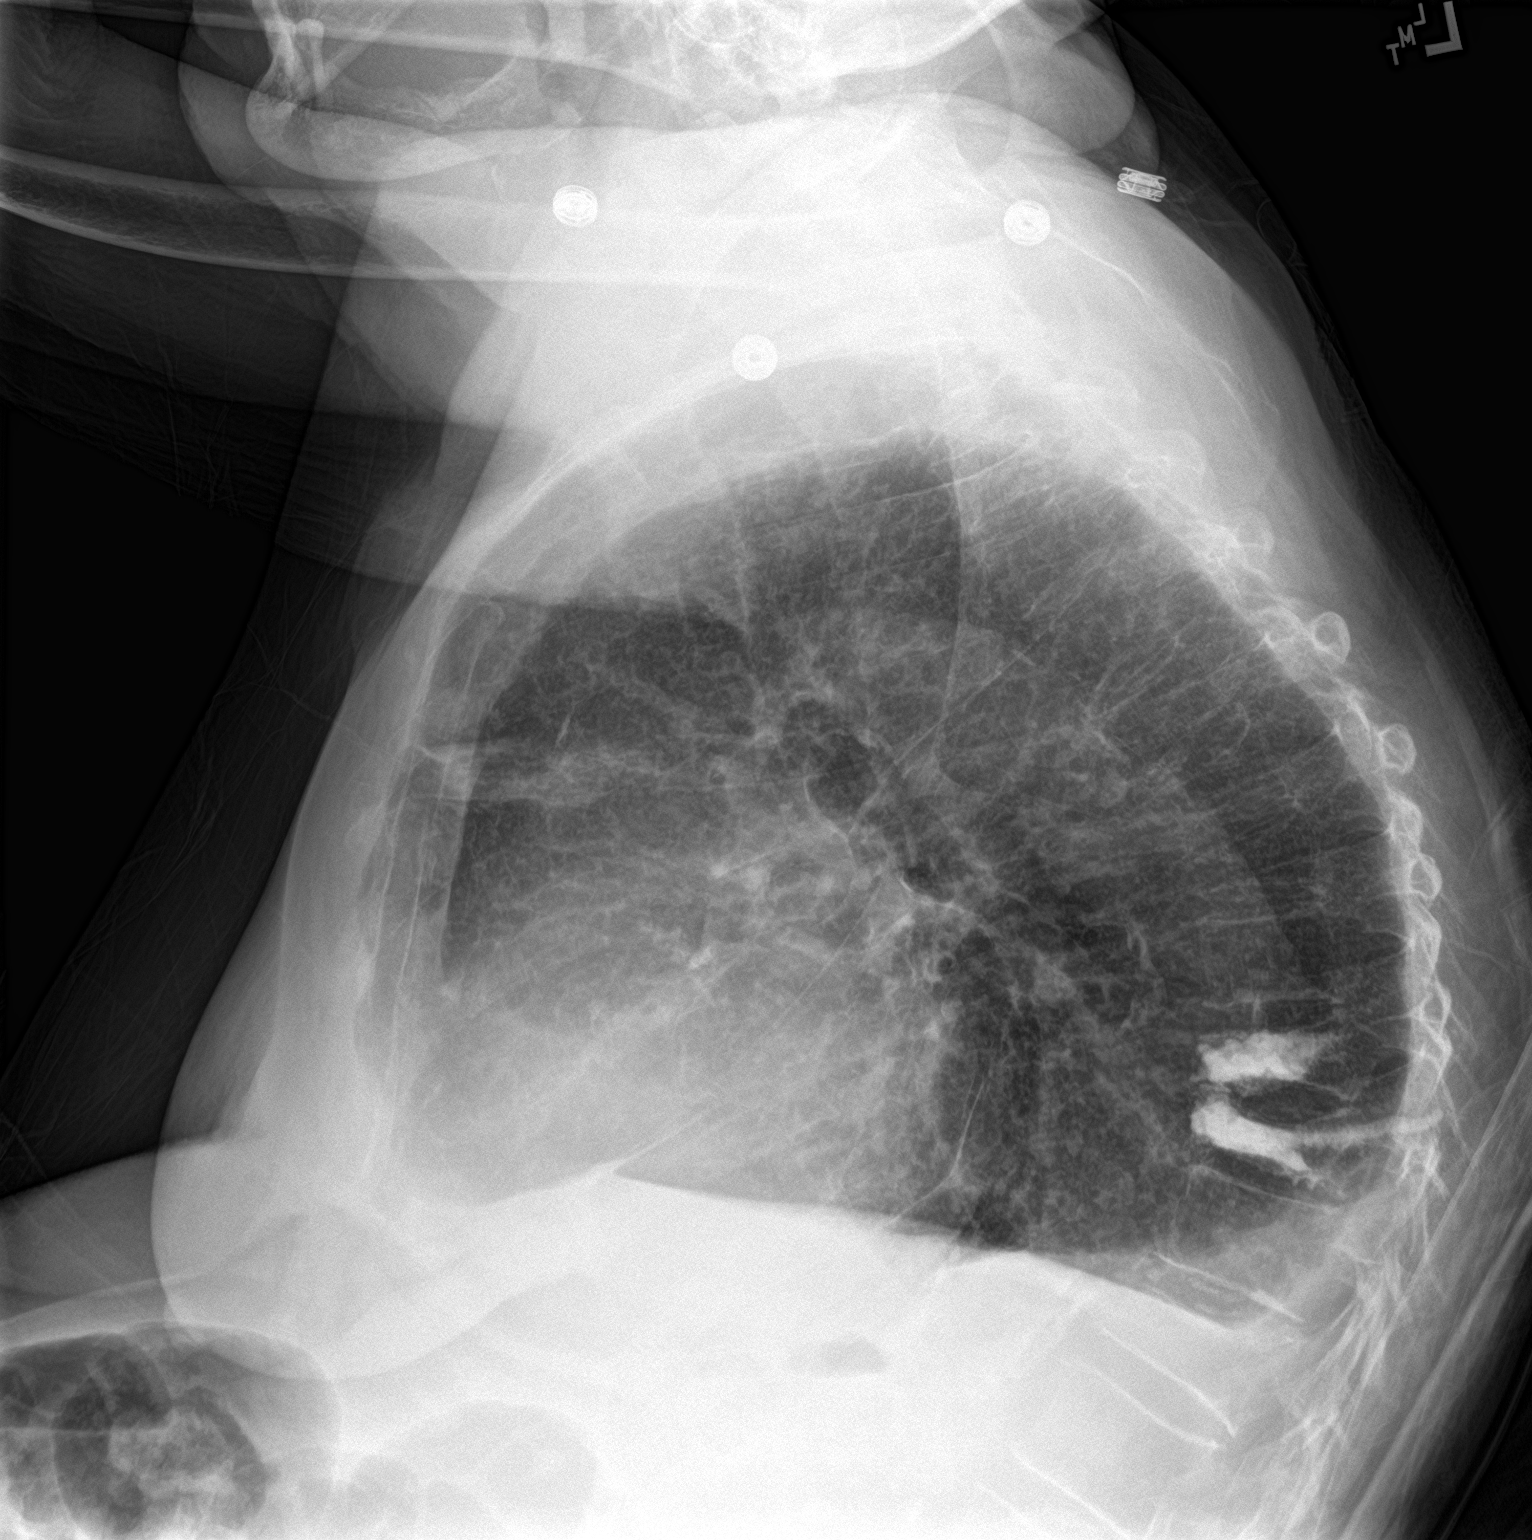

[chest ap]
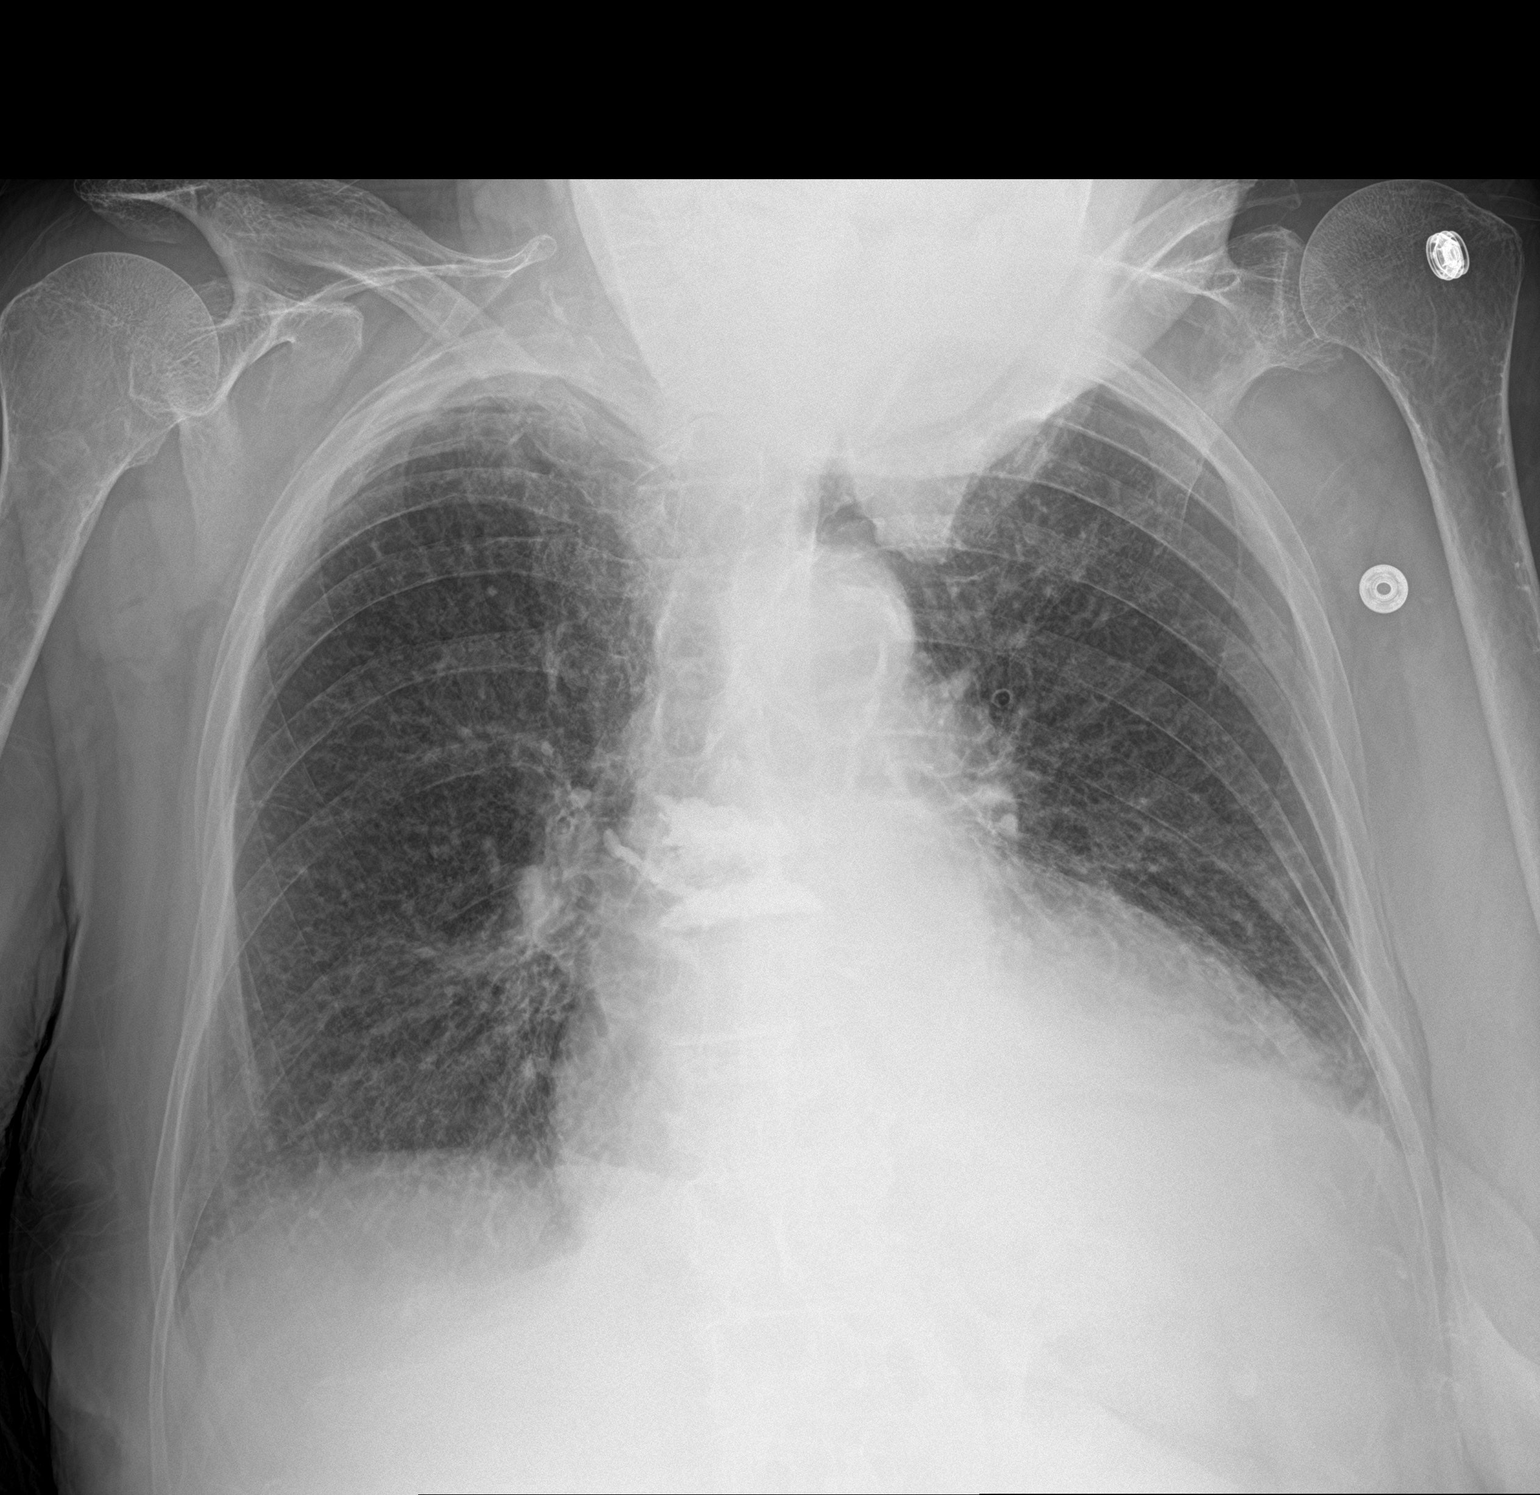

[2 of 2 positions shown; findings below may reference images not displayed]

FINDINGS: Cardiomegaly. Aortic atherosclerosis. Blunting of the posterior
costophrenic angles left more than right consistent with small
effusions. Slightly worsened volume loss/infiltrate in the left
lower lobe. Chronic interstitial lung markings seen elsewhere
diffusely. Old augmented thoracic compression fractures. Few other
un augmented regional fractures. Fracture at T12 was not present on
the CT of [DATE].
IMPRESSION: Small effusions left more than right. Some worsening of density in
the left lower lobe that could be atelectasis and or pneumonia.
Chronic interstitial lung markings. Newly seen compression deformity
T12. This was not present in [REDACTED] of last year.

## 2021-08-17 MED ORDER — MAGNESIUM SULFATE 2 GM/50ML IV SOLN
2.0000 g | Freq: Once | INTRAVENOUS | Status: AC
  Administered 2021-08-17: 2 g via INTRAVENOUS
  Filled 2021-08-17: qty 50

## 2021-08-17 MED ORDER — ALBUTEROL SULFATE (2.5 MG/3ML) 0.083% IN NEBU
2.5000 mg | INHALATION_SOLUTION | Freq: Three times a day (TID) | RESPIRATORY_TRACT | Status: DC
  Administered 2021-08-17 – 2021-08-18 (×4): 2.5 mg via RESPIRATORY_TRACT
  Filled 2021-08-17 (×4): qty 3

## 2021-08-17 MED ORDER — DOXYCYCLINE HYCLATE 100 MG PO TABS
100.0000 mg | ORAL_TABLET | Freq: Two times a day (BID) | ORAL | Status: DC
  Administered 2021-08-17: 100 mg via ORAL
  Filled 2021-08-17 (×3): qty 1

## 2021-08-17 MED ORDER — AMOXICILLIN-POT CLAVULANATE 875-125 MG PO TABS
1.0000 | ORAL_TABLET | Freq: Two times a day (BID) | ORAL | Status: DC
  Administered 2021-08-17 – 2021-08-18 (×3): 1 via ORAL
  Filled 2021-08-17 (×3): qty 1

## 2021-08-17 MED ORDER — LACTULOSE 10 GM/15ML PO SOLN
20.0000 g | Freq: Once | ORAL | Status: AC
  Administered 2021-08-17: 20 g via ORAL
  Filled 2021-08-17: qty 30

## 2021-08-17 MED ORDER — POLYETHYLENE GLYCOL 3350 17 G PO PACK
17.0000 g | PACK | Freq: Every day | ORAL | Status: DC
  Administered 2021-08-18: 17 g via ORAL
  Filled 2021-08-17: qty 1

## 2021-08-17 NOTE — Assessment & Plan Note (Signed)
Patient on Coreg and Cozaar ?

## 2021-08-17 NOTE — Consult Note (Signed)
PHARMACY CONSULT NOTE - FOLLOW UP ? ?Pharmacy Consult for Electrolyte Monitoring and Replacement  ? ?Recent Labs: ?Potassium (mmol/L)  ?Date Value  ?08/17/2021 4.4  ? ?Magnesium (mg/dL)  ?Date Value  ?08/17/2021 1.8  ? ?Calcium (mg/dL)  ?Date Value  ?08/17/2021 8.3 (L)  ? ?Albumin (g/dL)  ?Date Value  ?08/14/2021 3.2 (L)  ? ?Phosphorus (mg/dL)  ?Date Value  ?08/16/2021 2.7  ? ?Sodium (mmol/L)  ?Date Value  ?08/17/2021 135  ?06/27/2021 141  ? ?Corrected Calcium: 6.94 > 7.24-->8.94 ?Ionized Ca: 4.4 (goal 4.5-5.6) ? ?Assessment: ?Pharmacy has been consulted to monitor and replace electrolytes in 81yo female admitted via EMS with new onset tremors.  ? ?3/28: PO lasix 20 mg daily started (takes 40 mg daily PTA) ? ?Goal of Therapy:  ?Electrolytes WNL, K>=4, Mg >=2 ? ?Plan:  ?Hypocalcemia -- improving. Ionized Ca checked and only slightly low. ?Will hold off on further supplementation currently ?Anticipate transition to oral Ca replacement 08/18/21 if continued replacement warranted  ?Consider discharging patient on daily Ca, K, Mag supplements if chronic diuretic therapy continued  ?Recheck electrolyte with AM labs  ? ? ?Pearla Dubonnet, PharmD ?Clinical Pharmacist ?08/17/2021 ?1:10 PM ? ? ?

## 2021-08-17 NOTE — Assessment & Plan Note (Addendum)
This has resolved.  Likely secondary to severe electrolyte abnormalities with magnesium being less than 0.5 and calcium being 6.3.  Last magnesium 2.0.  Last calcium 8.0. ?

## 2021-08-17 NOTE — Progress Notes (Signed)
OT Cancellation Note ? ?Patient Details ?Name: Marissa Fletcher ?MRN: [id number] ?DOB: 1941/03/04 ? ? ?Cancelled Treatment:    Reason Eval/Treat Not Completed: Patient at procedure or test/ unavailable (Ot treatment attempted this morning. Pt. in the middle of nursing care. Will reattempt OT tx at a later time.) ? ?Harrel Carina, MS, OTR/L ?08/17/2021, 10:49 AM ?

## 2021-08-17 NOTE — Progress Notes (Signed)
?  Progress Note ? ? ?Patient: Marissa Fletcher QXI:[id number] DOB: Jun 03, 1940 DOA: 08/14/2021     2 ?DOS: the patient was seen and examined on 08/17/2021 ?  ?Assessment and Plan: ?* Tremor ?This has resolved.  Likely secondary to severe electrolyte abnormalities with magnesium being less than 0.5 and calcium being 6.3. ? ?Disorder of electrolytes ?Calcium and magnesium replaced into the normal range. ? ?Sepsis ([place]) ?Present on admission with leukocytosis tachycardia and tachypnea.  Patient had cellulitis left lower extremity and UTI.  Patient was on IV Rocephin and switched over to p.o. Keflex.  With today's chest x-ray showing possibility of pneumonia I will switch Keflex over to Augmentin and doxycycline. ? ?Iron deficiency anemia ?Last hemoglobin 8.9.  Continue to monitor.  Check a ferritin level tomorrow. ? ?Chronic combined systolic and diastolic heart failure ([place]) ?Patient restarted on a low-dose Lasix 20 mg daily.  Continue Coreg and Cozaar.  Last ejection fraction 35 to 40%. ? ?Type 2 diabetes mellitus with hyperlipidemia ([place]) ?Continue Lipitor.  Patient's on sliding scale insulin here.  At home takes [place] and metformin. ? ?Essential hypertension ?Patient on Coreg and Cozaar ? ? ? ? ?  ? ?Subjective: Patient feeling better than when she came in.  No further tremors.  Admitted with electrolyte abnormalities.  Little bit of cough and short of breath. ? ?Physical Exam: ?Vitals:  ? 08/17/21 0737 08/17/21 1110 08/17/21 1140 08/17/21 1542  ?BP: 138/68  112/65 125/74  ?Pulse: 90  88 84  ?Resp: 15  16 16   ?Temp: 98.9 ?F (37.2 ?C)  98.3 ?F (36.8 ?C) 98.3 ?F (36.8 ?C)  ?TempSrc: Oral  Oral   ?SpO2: 96% 97% 95% 93%  ?Weight:      ? ?Physical Exam ?HENT:  ?   Head: Normocephalic.  ?   Mouth/Throat:  ?   Pharynx: No oropharyngeal exudate.  ?Eyes:  ?   General: Lids are normal.  ?   Conjunctiva/sclera: Conjunctivae normal.  ?Cardiovascular:  ?   Rate and Rhythm: Normal rate and regular rhythm.  ?   Heart sounds: Normal  heart sounds, S1 normal and S2 normal.  ?Pulmonary:  ?   Breath sounds: Examination of the right-middle field reveals wheezing. Examination of the right-lower field reveals decreased breath sounds and wheezing. Examination of the left-lower field reveals wheezing. Decreased breath sounds and wheezing present. No rhonchi or rales.  ?Abdominal:  ?   Palpations: Abdomen is soft.  ?   Tenderness: There is no abdominal tenderness.  ?Musculoskeletal:  ?   Right lower leg: No swelling.  ?   Left lower leg: No swelling.  ?Skin: ?   General: Skin is warm.  ?   Findings: No rash.  ?Neurological:  ?   Mental Status: She is alert and oriented to person, place, and time.  ?  ?Data Reviewed: ?Today's chest x-ray could not rule out pneumonia.  Today's calcium 8.3, potassium 4.4, magnesium 1.8 ? ? ?Family Communication: Spoke with daughter at the bedside ? ?Disposition: ?Status is: Inpatient ?Remains inpatient appropriate because: Slight wheeze today.  Repeat chest x-ray shows possible pneumonia ? ?Planned Discharge Destination: Rehab ? ?Author: ?Loletha Grayer, MD ?08/17/2021 3:44 PM ? ?For on call review [email or web address].  ?

## 2021-08-17 NOTE — Assessment & Plan Note (Addendum)
Last hemoglobin 9.3.  Ferritin 25.  With treating infection I will give oral iron and refer to hematology as outpatient for iron infusions. ?

## 2021-08-17 NOTE — Progress Notes (Signed)
Physical Therapy Treatment ?Patient Details ?Name: Marissa Fletcher ?MRN: [id number] ?DOB: 25-Feb-1941 ?Today's Date: 08/17/2021 ? ? ?History of Present Illness Marissa Fletcher is a 81 y.o. 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 ? ?  ?PT Comments  ? ? Pt progressing with standing tolerance (pt's BP in standing 101/63) and balance ( pt required min A/ cues for shifting weight forward over her feet when first rising to stand) and  gait ambulating with RW 57ft with min A.  Pt continues to report fatigue and discomfort with pain in Left side/ abdominal area; nursing is aware. Recommendations remains STR for d/c.  ?Recommendations for follow up therapy are one component of a multi-disciplinary discharge planning process, led by the attending physician.  Recommendations may be updated based on patient status, additional functional criteria and insurance authorization. ? ?Follow Up Recommendations ? Skilled nursing-short term rehab (<3 hours/day) ?  ?  ?Assistance Recommended at Discharge Intermittent Supervision/Assistance  ?Patient can return home with the following A little help with walking and/or transfers;A little help with bathing/dressing/bathroom;Assistance with cooking/housework;Assist for transportation;Help with stairs or ramp for entrance ?  ?Equipment Recommendations ? Rolling walker (2 wheels)  ?  ?Recommendations for Other Services   ? ? ?  ?Precautions / Restrictions Precautions ?Precautions: Fall ?Restrictions ?Weight Bearing Restrictions: No  ?  ? ?Mobility ? Bed Mobility ?Overal bed mobility: Needs Assistance ?  ?  ?  ?  ?  ?  ?General bed mobility comments: Pt up in recliner. ?  ? ?Transfers ?Overall transfer level: Needs  assistance ?Equipment used: Rolling walker (2 wheels) ?Transfers: Sit to/from Stand ?Sit to Stand: Min assist ?Stand pivot transfers: Min assist ?  ?  ?  ?  ?General transfer comment: Min A +1 cues to shift weight forward to stabilize. ?  ? ?Ambulation/Gait ?Ambulation/Gait assistance: Min assist ?Gait Distance (Feet): 40 Feet ?Assistive device: Rolling walker (2 wheels) ?Gait Pattern/deviations: Step-through pattern, Trunk flexed ?  ?  ?  ?General Gait Details: PT reported not feeling dizzy but felt limited due to pain in right side. ? ? ?Stairs ?  ?  ?  ?  ?  ? ? ?Wheelchair Mobility ?  ? ?Modified Rankin (Stroke Patients Only) ?  ? ? ?  ?Balance Overall balance assessment: Needs assistance ?Sitting-balance support: No upper extremity supported, Feet supported ?Sitting balance-Leahy Scale: Fair ?  ?  ?Standing balance support: Reliant on assistive device for balance, During functional activity, Bilateral upper extremity supported ?Standing balance-Leahy Scale: Good ?Standing balance comment: Pt able to correct posterior lean with min cues. ?  ?  ?  ?  ?  ?  ?  ?  ?  ?  ?  ?  ? ?  ?Cognition Arousal/Alertness: Awake/alert ?Behavior During Therapy: [facility] for tasks assessed/performed ?Overall Cognitive Status: Within Functional Limits for tasks assessed ?  ?  ?  ?  ?  ?  ?  ?  ?  ?  ?  ?  ?  ?  ?  ?  ?  ?  ?  ? ?  ?Exercises Other Exercises ?Other Exercises: Standing balance exercises: marching in place x10, weight shifts (lateral), glut squeezes. Cues for technique. ? ?  ?General Comments General comments (skin integrity, edema, etc.): Standing BP 101/63 ?  ?  ? ?  Pertinent Vitals/Pain Pain Assessment ?Pain Assessment: Faces ?Faces Pain Scale: Hurts little more ?Pain Descriptors / Indicators: Constant ?Pain Intervention(s): Limited activity within patient's tolerance, Other (comment) (Notified RN of pt's Right side pain.)  ? ? ?Home Living Family/patient expects to be discharged to:: Private residence ?  ?Available  Help at Discharge: Family;Friend(s);Neighbor;Available PRN/intermittently ?Type of Home: Apartment ?  ?  ?  ?  ?  ?  ?   ?  ?Prior Function    ?  ?  ?   ? ?PT Goals (current goals can now be found in the care plan section) Acute Rehab PT Goals ?Patient Stated Goal: to improve symptoms/strength to go home ?PT Goal Formulation: With patient ?Time For Goal Achievement: 08/29/21 ?Potential to Achieve Goals: Fair ?Progress towards PT goals: Progressing toward goals ? ?  ?Frequency ? ? ? Min 2X/week ? ? ? ?  ?PT Plan    ? ? ?Co-evaluation PT/OT/SLP Co-Evaluation/Treatment: Yes ?  ?  ?  ?  ? ?  ?AM-PAC PT "6 Clicks" Mobility   ?Outcome Measure ? Help needed turning from your back to your side while in a flat bed without using bedrails?: A Little ?Help needed moving from lying on your back to sitting on the side of a flat bed without using bedrails?: A Little ?Help needed moving to and from a bed to a chair (including a wheelchair)?: A Little ?Help needed standing up from a chair using your arms (e.g., wheelchair or bedside chair)?: A Little ?Help needed to walk in hospital room?: A Little ?Help needed climbing 3-5 steps with a railing? : A Lot ?6 Click Score: 17 ? ?  ?End of Session Equipment Utilized During Treatment: Gait belt ?Activity Tolerance: Other (comment) ?Patient left: in bed;with call bell/phone within reach;with bed alarm set;with family/visitor present ?Nurse Communication: Mobility status ?PT Visit Diagnosis: Unsteadiness on feet (R26.81);History of falling (Z91.81);Muscle weakness (generalized) (M62.81);Other abnormalities of gait and mobility (R26.89) ?  ? ? ?Time: 0093-8182 ?PT Time Calculation (min) (ACUTE ONLY): 23 min ? ?Charges:  $Gait Training: 8-22 mins ?$Therapeutic Activity: 8-22 mins          ?          ? ?Bjorn Loser, PTA  ?08/17/21, 1:31 PM  ?

## 2021-08-17 NOTE — Assessment & Plan Note (Addendum)
Present on admission with leukocytosis tachycardia and tachypnea.  Patient had cellulitis left lower extremity and UTI.  Patient was on IV Rocephin and switched over to p.o. Keflex.  With today's chest x-ray showing possibility of pneumonia I  switched Keflex over to Augmentin. ?

## 2021-08-17 NOTE — Assessment & Plan Note (Addendum)
Continue Lipitor.  Patient's on sliding scale insulin here.  Restart metformin as outpatient.  Check fingersticks daily. ?

## 2021-08-17 NOTE — Assessment & Plan Note (Addendum)
Patient restarted on a low-dose Lasix 20 mg daily.  Continue Coreg and Cozaar.  Last ejection fraction 35 to 40%.  Daily weights.  Follow-up CHF clinic. ?

## 2021-08-17 NOTE — Assessment & Plan Note (Addendum)
Last magnesium 2.0 last calcium 8.0.  Will replace calcium upon disposition. ?

## 2021-08-18 DIAGNOSIS — E878 Other disorders of electrolyte and fluid balance, not elsewhere classified: Secondary | ICD-10-CM | POA: Diagnosis not present

## 2021-08-18 DIAGNOSIS — I5022 Chronic systolic (congestive) heart failure: Secondary | ICD-10-CM

## 2021-08-18 DIAGNOSIS — A419 Sepsis, unspecified organism: Secondary | ICD-10-CM | POA: Diagnosis not present

## 2021-08-18 DIAGNOSIS — E1169 Type 2 diabetes mellitus with other specified complication: Secondary | ICD-10-CM | POA: Diagnosis not present

## 2021-08-18 DIAGNOSIS — R251 Tremor, unspecified: Secondary | ICD-10-CM | POA: Diagnosis not present

## 2021-08-18 LAB — BASIC METABOLIC PANEL
Anion gap: 5 (ref 5–15)
BUN: 6 mg/dL — ABNORMAL LOW (ref 8–23)
CO2: 25 mmol/L (ref 22–32)
Calcium: 8 mg/dL — ABNORMAL LOW (ref 8.9–10.3)
Chloride: 107 mmol/L (ref 98–111)
Creatinine, Ser: 0.44 mg/dL (ref 0.44–1.00)
GFR, Estimated: >60 mL/min (ref >60)
Glucose, Bld: 115 mg/dL — ABNORMAL HIGH (ref 70–99)
Potassium: 4.3 mmol/L (ref 3.5–5.1)
Sodium: 137 mmol/L (ref 135–145)

## 2021-08-18 LAB — CBC
HCT: 30.7 % — ABNORMAL LOW (ref 36.0–46.0)
Hemoglobin: 9.3 g/dL — ABNORMAL LOW (ref 12.0–15.0)
MCH: 23.8 pg — ABNORMAL LOW (ref 26.0–34.0)
MCHC: 30.3 g/dL (ref 30.0–36.0)
MCV: 78.7 fL — ABNORMAL LOW (ref 80.0–100.0)
Platelets: 330 10*3/uL (ref 150–400)
RBC: 3.9 MIL/uL (ref 3.87–5.11)
RDW: 19.3 % — ABNORMAL HIGH (ref 11.5–15.5)
WBC: 5.5 10*3/uL (ref 4.0–10.5)
nRBC: 0 % (ref 0.0–0.2)

## 2021-08-18 LAB — FERRITIN: Ferritin: 25 ng/mL (ref 11–307)

## 2021-08-18 LAB — GLUCOSE, CAPILLARY
Glucose-Capillary: 104 mg/dL — ABNORMAL HIGH (ref 70–99)
Glucose-Capillary: 146 mg/dL — ABNORMAL HIGH (ref 70–99)
Glucose-Capillary: 129 mg/dL — ABNORMAL HIGH (ref 70–99)

## 2021-08-18 LAB — MAGNESIUM: Magnesium: 2 mg/dL (ref 1.7–2.4)

## 2021-08-18 MED ORDER — FERROUS SULFATE 325 (65 FE) MG PO TABS: 325.0000 mg | ORAL_TABLET | Freq: Every day | ORAL | 3 refills | Status: DC

## 2021-08-18 MED ORDER — ALBUTEROL SULFATE HFA 108 (90 BASE) MCG/ACT IN AERS: 2.0000 | INHALATION_SPRAY | Freq: Four times a day (QID) | RESPIRATORY_TRACT | 0 refills | Status: DC | PRN

## 2021-08-18 MED ORDER — NICOTINE 21 MG/24HR TD PT24: 21.0000 mg | MEDICATED_PATCH | Freq: Every day | TRANSDERMAL | 0 refills | Status: DC

## 2021-08-18 MED ORDER — LIDOCAINE 5 % EX PTCH: 1.0000 | MEDICATED_PATCH | CUTANEOUS | 0 refills | Status: DC

## 2021-08-18 MED ORDER — POLYETHYLENE GLYCOL 3350 17 G PO PACK: 34.0000 g | PACK | ORAL | Status: DC | PRN

## 2021-08-18 MED ORDER — MUPIROCIN 2 % EX OINT
TOPICAL_OINTMENT | CUTANEOUS | 0 refills | Status: DC
Start: 2021-08-18 — End: 2021-09-06

## 2021-08-18 MED ORDER — MAGNESIUM OXIDE -MG SUPPLEMENT 400 (240 MG) MG PO TABS: 400.0000 mg | ORAL_TABLET | Freq: Every day | ORAL | 0 refills | Status: DC

## 2021-08-18 MED ORDER — FLEET ENEMA 7-19 GM/118ML RE ENEM
1.0000 | ENEMA | Freq: Once | RECTAL | Status: AC
  Administered 2021-08-18: 1 via RECTAL

## 2021-08-18 MED ORDER — ENSURE ENLIVE PO LIQD: 237.0000 mL | Freq: Two times a day (BID) | ORAL | 0 refills | Status: DC

## 2021-08-18 MED ORDER — SORBITOL 70 % SOLN
960.0000 mL | TOPICAL_OIL | Freq: Once | ORAL | Status: DC
  Filled 2021-08-18: qty 473

## 2021-08-18 MED ORDER — FUROSEMIDE 20 MG PO TABS: 20.0000 mg | ORAL_TABLET | Freq: Every day | ORAL | 0 refills | Status: DC

## 2021-08-18 MED ORDER — AMOXICILLIN-POT CLAVULANATE 875-125 MG PO TABS: 1.0000 | ORAL_TABLET | Freq: Two times a day (BID) | ORAL | 0 refills | Status: AC

## 2021-08-18 MED ORDER — ALPRAZOLAM 0.25 MG PO TABS: 0.2500 mg | ORAL_TABLET | Freq: Every day | ORAL | 0 refills | Status: DC | PRN

## 2021-08-18 MED ORDER — MAGNESIUM OXIDE -MG SUPPLEMENT 400 (240 MG) MG PO TABS
400.0000 mg | ORAL_TABLET | Freq: Every day | ORAL | Status: DC
  Administered 2021-08-18: 400 mg via ORAL
  Filled 2021-08-18: qty 1

## 2021-08-18 MED ORDER — BISACODYL 10 MG RE SUPP
10.0000 mg | Freq: Once | RECTAL | Status: AC
  Administered 2021-08-18: 10 mg via RECTAL

## 2021-08-18 NOTE — Progress Notes (Signed)
Physical Therapy Treatment ?Patient Details ?Name: Marissa Fletcher ?MRN: [id number] ?DOB: 1941/02/17 ?Today's Date: 08/18/2021 ? ? ?History of Present Illness SHUNTE SENSENEY is a 81 y.o. 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 ? ?  ?PT Comments  ? ? Patient fatigued from sitting up in chair and declined getting out of bed or ambulating. She is agreeable to in bed LE exercises for strengthening. No increased pain with LE exercises and no shortness of breath noted with activity. Recommend PT follow up to maximize independence and facilitate return to prior level of function.  ?  ?Recommendations for follow up therapy are one component of a multi-disciplinary discharge planning process, led by the attending physician.  Recommendations may be updated based on patient status, additional functional criteria and insurance authorization. ? ?Follow Up Recommendations ? Skilled nursing-short term rehab (<3 hours/day) ?  ?  ?Assistance Recommended at Discharge Intermittent Supervision/Assistance  ?Patient can return home with the following A little help with walking and/or transfers;A little help with bathing/dressing/bathroom;Assistance with cooking/housework;Assist for transportation;Help with stairs or ramp for entrance ?  ?Equipment Recommendations ? Rolling walker (2 wheels)  ?  ?Recommendations for Other Services   ? ? ?  ?Precautions / Restrictions Precautions ?Precautions: Fall ?Restrictions ?Weight Bearing Restrictions: No  ?  ? ?Mobility ? Bed Mobility ?  ?  ?  ?  ?  ?  ?  ?General bed mobility comments: patient declined getting out of bed at this time due to fatigue. she reports just getting back to  bed ?  ? ?Transfers ?  ?  ?  ?  ?  ?  ?  ?  ?  ?  ?   ? ?Ambulation/Gait ?  ?  ?  ?  ?  ?  ?  ?  ? ? ?Stairs ?  ?  ?  ?  ?  ? ? ?Wheelchair Mobility ?  ? ?Modified Rankin (Stroke Patients Only) ?  ? ? ?  ?Balance   ?  ?  ?  ?  ?  ?  ?  ?  ?  ?  ?  ?  ?  ?  ?  ?  ?  ?  ?  ? ?  ?Cognition Arousal/Alertness: Awake/alert ?Behavior During Therapy: [facility] for tasks assessed/performed ?Overall Cognitive Status: Within Functional Limits for tasks assessed ?  ?  ?  ?  ?  ?  ?  ?  ?  ?  ?  ?  ?  ?  ?  ?  ?General Comments: patient is able to follow single step commands without difficulty ?  ?  ? ?  ?Exercises General Exercises - Lower Extremity ?Ankle Circles/Pumps: AROM, Strengthening, Both, 10 reps, Supine ?Short Arc Quad: AAROM, Strengthening, Both, 10 reps, Supine ?Heel Slides: AAROM, Strengthening, Both, 10 reps, Supine ?Hip ABduction/ADduction: AAROM, Strengthening, Both, 10 reps, Supine ?Other Exercises ?Other Exercises: verbal and visual cues for exercise technique for strengthening ? ?  ?General Comments   ?  ?  ? ?Pertinent Vitals/Pain Pain Assessment ?Pain Assessment: Faces ?Faces Pain Scale: Hurts a little bit ?Pain Location: left flank ?Pain Descriptors / Indicators: Sore ?Pain Intervention(s): Limited activity within patient's tolerance, Monitored during session  ? ? ?Home Living   ?  ?  ?  ?  ?  ?  ?  ?  ?  ?   ?  ?  Prior Function    ?  ?  ?   ? ?PT Goals (current goals can now be found in the care plan section) Acute Rehab PT Goals ?Patient Stated Goal: to get home ?PT Goal Formulation: With patient ?Time For Goal Achievement: 08/29/21 ?Potential to Achieve Goals: Fair ?Progress towards PT goals: Progressing toward goals ? ?  ?Frequency ? ? ? Min 2X/week ? ? ? ?  ?PT Plan Current plan remains appropriate  ? ? ?Co-evaluation   ?  ?  ?  ?  ? ?  ?AM-PAC PT "6 Clicks" Mobility   ?Outcome Measure ? Help needed turning from your back to your side while in a flat bed without using bedrails?: A Little ?Help needed moving from lying on your back to sitting on the side of a  flat bed without using bedrails?: A Little ?Help needed moving to and from a bed to a chair (including a wheelchair)?: A Little ?Help needed standing up from a chair using your arms (e.g., wheelchair or bedside chair)?: A Little ?Help needed to walk in hospital room?: A Little ?Help needed climbing 3-5 steps with a railing? : A Lot ?6 Click Score: 17 ? ?  ?End of Session   ?Activity Tolerance: Patient tolerated treatment well;No increased pain ?Patient left: in bed;with call bell/phone within reach;with bed alarm set;with family/visitor present (sister at the bedside) ?  ?PT Visit Diagnosis: Unsteadiness on feet (R26.81);History of falling (Z91.81);Muscle weakness (generalized) (M62.81);Other abnormalities of gait and mobility (R26.89) ?  ? ? ?Time: 1610-9604 ?PT Time Calculation (min) (ACUTE ONLY): 13 min ? ?Charges:  $Therapeutic Exercise: 8-22 mins          ?          ? ?Minna Merritts, PT, MPT ? ? ? ?Percell Locus ?08/18/2021, 3:28 PM ? ?

## 2021-08-18 NOTE — Plan of Care (Signed)

## 2021-08-18 NOTE — Assessment & Plan Note (Signed)
Continue nicotine patch  °

## 2021-08-18 NOTE — Care Management Important Message (Signed)
Important Message ? ?Patient Details  ?Name: Marissa Fletcher ?MRN: [id number] ?Date of Birth: 10-15-1940 ? ? ?Medicare Important Message Given:  Yes ? ? ? ? ?Juliann Pulse A Kerry Chisolm ?08/18/2021, 9:24 AM ?

## 2021-08-18 NOTE — Progress Notes (Signed)
Patient left at 2115 for facility with EMS ?

## 2021-08-18 NOTE — Discharge Summary (Signed)
?Physician Discharge Summary ?  ?Patient: Marissa Fletcher MRN: [id number] DOB: 01/02/1941  ?Admit date:     08/14/2021  ?Discharge date: 08/18/21  ?Discharge Physician: Loletha Grayer  ? ?PCP: Juluis Pitch, MD  ? ?Recommendations at discharge:  ? ?Follow-up team at rehab 1 day ?Follow-up hematology 3 weeks ? ?Discharge Diagnoses: ?Principal Problem: ?  Tremor ?Active Problems: ?  Disorder of electrolytes ?  Sepsis ([place]) ?  Iron deficiency anemia ?  Hypocalcemia ?  Hypomagnesemia ?  Diabetes mellitus without complication ([place]) ?  Essential hypertension ?  Depression with anxiety ?  Tobacco abuse ?  Type 2 diabetes mellitus with hyperlipidemia ([place]) ?  Stroke [facility]) ?  Chronic combined systolic and diastolic heart failure ([place]) ?  HLD (hyperlipidemia) ?  COPD (chronic obstructive pulmonary disease) ([place]) ?  Cellulitis of left lower extremity ?  UTI (urinary tract infection) ?  Malnutrition of moderate degree ? ? ? ?Hospital Course: ?The patient was admitted on 08/14/2021 and discharged on 08/18/2021.  The patient came in with shaking and tremor and weakness.  She was found to have a magnesium less than 0.5 and a calcium of 6.3.  She felt better after electrolytes were replaced.  She is also felt to have clinical sepsis secondary to UTI.  This was initially treated with Rocephin and then switched over to Keflex.  The patient did have some wheezing yesterday and a chest x-ray could not rule out pneumonia so Keflex was changed over to Augmentin.  She improved with nebulizer treatments.  Physical therapy saw the patient and recommended rehab. ? ?Assessment and Plan: ?* Tremor ?This has resolved.  Likely secondary to severe electrolyte abnormalities with magnesium being less than 0.5 and calcium being 6.3.  Last magnesium 2.0.  Last calcium 8.0. ? ?Disorder of electrolytes ?Last magnesium 2.0 last calcium 8.0.  Will replace calcium upon disposition. ? ?Sepsis ([place]) ?Present on admission with leukocytosis tachycardia and  tachypnea.  Patient had cellulitis left lower extremity and UTI.  Patient was on IV Rocephin and switched over to p.o. Keflex.  With today's chest x-ray showing possibility of pneumonia I  switched Keflex over to Augmentin. ? ?Iron deficiency anemia ?Last hemoglobin 9.3.  Ferritin 25.  With treating infection I will give oral iron and refer to hematology as outpatient for iron infusions. ? ?Chronic combined systolic and diastolic heart failure ([place]) ?Patient restarted on a low-dose Lasix 20 mg daily.  Continue Coreg and Cozaar.  Last ejection fraction 35 to 40%.  Daily weights.  Follow-up CHF clinic. ? ?Type 2 diabetes mellitus with hyperlipidemia ([place]) ?Continue Lipitor.  Patient's on sliding scale insulin here.  Restart metformin as outpatient.  Check fingersticks daily. ? ?Tobacco abuse ?Continue nicotine patch ? ?Essential hypertension ?Patient on Coreg and Cozaar ? ? ? ? ?  ? ? ?Consultants: None ?Procedures performed: None ?Disposition: Rehabilitation facility ?Diet recommendation:  ?Cardiac and Carb modified diet ?DISCHARGE MEDICATION: ?Allergies as of 08/18/2021   ? ?   Reactions  ? Sulfa Antibiotics Swelling  ? ?  ? ?  ?Medication List  ?  ? ?STOP taking these medications   ? ?dapagliflozin propanediol 10 MG Tabs tablet ?Commonly known as: FARXIGA ?  ? ?  ? ?TAKE these medications   ? ?acetaminophen 500 MG tablet ?Commonly known as: TYLENOL ?Take 2 tablets (1,000 mg total) by mouth 3 (three) times daily as needed for mild pain, moderate pain or headache. ?  ?albuterol 108 (90 Base) MCG/ACT inhaler ?Commonly known as: VENTOLIN  HFA ?Inhale 2 puffs into the lungs every 6 (six) hours as needed for wheezing or shortness of breath. ?  ?ALPRAZolam 0.25 MG tablet ?Commonly known as: Duanne Moron ?Take 1 tablet (0.25 mg total) by mouth daily as needed for anxiety. ?  ?amoxicillin-clavulanate 875-125 MG tablet ?Commonly known as: AUGMENTIN ?Take 1 tablet by mouth every 12 (twelve) hours for 4 days. ?  ?aspirin 81 MG EC  tablet ?Take 1 tablet (81 mg total) by mouth daily. Swallow whole. ?  ?atorvastatin 20 MG tablet ?Commonly known as: LIPITOR ?Take 20 mg by mouth every Monday, Wednesday, and Friday. ?  ?carvedilol 6.25 MG tablet ?Commonly known as: COREG ?Take 1 tablet (6.25 mg total) by mouth 2 (two) times daily with a meal. ?  ?clopidogrel 75 MG tablet ?Commonly known as: PLAVIX ?Take 1 tablet (75 mg total) by mouth daily. ?  ?doxepin 25 MG capsule ?Commonly known as: SINEQUAN ?Take 50-75 mg by mouth at bedtime. ?  ?feeding supplement Liqd ?Take 237 mLs by mouth 2 (two) times daily between meals. ?  ?ferrous sulfate 325 (65 FE) MG tablet ?Take 1 tablet (325 mg total) by mouth daily. ?  ?furosemide 20 MG tablet ?Commonly known as: LASIX ?Take 1 tablet (20 mg total) by mouth daily. ?What changed:  ?medication strength ?how much to take ?  ?gabapentin 300 MG capsule ?Commonly known as: NEURONTIN ?Take 300 mg by mouth at bedtime. ?  ?lidocaine 5 % ?Commonly known as: LIDODERM ?Place 1 patch onto the skin daily. Remove & Discard patch within 12 hours or as directed by MD to areas of pain ?  ?losartan 25 MG tablet ?Commonly known as: COZAAR ?Take 0.5 tablets (12.5 mg total) by mouth daily. ?  ?magnesium oxide 400 (240 Mg) MG tablet ?Commonly known as: MAG-OX ?Take 1 tablet (400 mg total) by mouth daily. ?  ?metFORMIN 500 MG 24 hr tablet ?Commonly known as: GLUCOPHAGE-XR ?Take 500 mg by mouth 2 (two) times daily. ?  ?mupirocin ointment 2 % ?Commonly known as: BACTROBAN ?Clean feet with soap and water, rinse and pat dry.  Apply Bactroban on any wounds on the toes and then wrapped toes with 1 inch Kerlix weeding between the toes twice a day. ?  ?nicotine 21 mg/24hr patch ?Commonly known as: NICODERM CQ - dosed in mg/24 hours ?Place 1 patch (21 mg total) onto the skin daily. ?  ?PARoxetine 30 MG tablet ?Commonly known as: PAXIL ?Take 30 mg by mouth every morning. ?  ?polyethylene glycol 17 g packet ?Commonly known as: MIRALAX /  GLYCOLAX ?Take 17 g by mouth daily as needed for moderate constipation. ?  ? ?  ? ? Contact information for follow-up providers   ? ? Lloyd Huger, MD Follow up in 3 week(s).   ?Specialty: Oncology ?Why: iron deficicency anemia ?Contact information: ?[address] ?[place] [address] ?[phone] ? ? ?  ?  ? ?  ?  ? ? Contact information for after-discharge care   ? ? Destination   ? ? [facility] Preferred SNF .   ?Service: Skilled Nursing ?Contact information: ?[address] ?[place] ?[phone] ? ?  ?  ? ?  ?  ? ?  ?  ? ?  ? ?Discharge Exam: ?Filed Weights  ? 08/16/21 0500 08/17/21 0500 08/18/21 0500  ?Weight: 62.6 kg 64.2 kg 61.4 kg  ? ?Physical Exam ?HENT:  ?   Head: Normocephalic.  ?  Mouth/Throat:  ?   Pharynx: No oropharyngeal exudate.  ?Eyes:  ?   General: Lids are normal.  ?   Conjunctiva/sclera: Conjunctivae normal.  ?Cardiovascular:  ?   Rate and Rhythm: Normal rate and regular rhythm.  ?   Heart sounds: Normal heart sounds, S1 normal and S2 normal.  ?Pulmonary:  ?   Breath sounds: Examination of the right-lower field reveals decreased breath sounds. Decreased breath sounds present. No wheezing, rhonchi or rales.  ?Abdominal:  ?   Palpations: Abdomen is soft.  ?   Tenderness: There is no abdominal tenderness.  ?Musculoskeletal:  ?   Right lower leg: No swelling.  ?   Left lower leg: No swelling.  ?Skin: ?   General: Skin is warm.  ?   Findings: No rash.  ?Neurological:  ?   Mental Status: She is alert and oriented to person, place, and time.  ?  ? ?Condition at discharge: stable ? ?The results of significant diagnostics from this hospitalization (including imaging, microbiology, ancillary and laboratory) are listed below for reference.  ? ?Imaging Studies: ?DG Chest 2 View ? ?Result Date: 08/17/2021 ?CLINICAL DATA:  Wheezing EXAM: CHEST - 2 VIEW COMPARISON:  08/14/2021 FINDINGS:  Cardiomegaly. Aortic atherosclerosis. Blunting of the posterior costophrenic angles left more than right consistent with small effusions. Slightly worsened volume loss/infiltrate in the left lower lobe. Chronic interstitia

## 2021-08-18 NOTE — Progress Notes (Signed)
Patient was asleep upon rounding. Patient is awaiting EMS transport to [facility] per day nurse. Nurse stated that everything is complete and that report has been called to facility. Assessed and saw that IV was removed.  ?

## 2021-08-18 NOTE — TOC Transition Note (Signed)
Transition of Care (TOC) - CM/SW Discharge Note ? ? ?Patient Details  ?Name: NIAMYA VITTITOW ?MRN: [id number] ?Date of Birth: 07-11-1940 ? ?Transition of Care (TOC) CM/SW Contact:  ?Conception Oms, RN ?Phone Number: ?08/18/2021, 4:31 PM ? ? ?Clinical Narrative:   Patient had a Bowel Movement and EMS was called to transport to room 508 at [facility], Patient will notify her family, she is 3rd on the list for ems ? ? ? ?  ?Barriers to Discharge: Continued Medical Work up, SNF Pending bed offer ? ? ?Patient Goals and CMS Choice ?  ?  ?  ? ?Discharge Placement ?  ?           ?  ?  ?  ?  ? ?Discharge Plan and Services ?  ?  ?           ?  ?  ?  ?  ?  ?  ?  ?  ?  ?  ? ?Social Determinants of Health (SDOH) Interventions ?  ? ? ?Readmission Risk Interventions ? ?  08/15/2021  ?  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 ? ? ? ? ?

## 2021-08-19 ENCOUNTER — Ambulatory Visit: Payer: Medicare Other

## 2021-08-19 LAB — CULTURE, BLOOD (ROUTINE X 2)
Culture: NO GROWTH
Culture: NO GROWTH
Special Requests: ADEQUATE
Special Requests: ADEQUATE

## 2021-08-24 ENCOUNTER — Ambulatory Visit: Payer: Medicare Other | Admitting: Family

## 2021-08-24 ENCOUNTER — Ambulatory Visit: Payer: Medicare Other | Admitting: Radiation Oncology

## 2021-08-26 ENCOUNTER — Ambulatory Visit: Payer: Medicare Other | Admitting: Medical

## 2021-09-02 ENCOUNTER — Encounter: Payer: Self-pay | Admitting: Intensive Care

## 2021-09-02 ENCOUNTER — Inpatient Hospital Stay
Admission: EM | Admit: 2021-09-02 | Discharge: 2021-09-06 | DRG: 552 | Disposition: A | Discharge status: Skilled Nursing Facility | Payer: Medicare Other | Attending: Hospitalist | Admitting: Hospitalist

## 2021-09-02 ENCOUNTER — Emergency Department: Payer: Medicare Other

## 2021-09-02 ENCOUNTER — Other Ambulatory Visit: Payer: Self-pay

## 2021-09-02 DIAGNOSIS — F17210 Nicotine dependence, cigarettes, uncomplicated: Secondary | ICD-10-CM | POA: Diagnosis present

## 2021-09-02 DIAGNOSIS — S22080A Wedge compression fracture of T11-T12 vertebra, initial encounter for closed fracture: Secondary | ICD-10-CM | POA: Diagnosis present

## 2021-09-02 DIAGNOSIS — I2510 Atherosclerotic heart disease of native coronary artery without angina pectoris: Secondary | ICD-10-CM | POA: Diagnosis present

## 2021-09-02 DIAGNOSIS — Y92009 Unspecified place in unspecified non-institutional (private) residence as the place of occurrence of the external cause: Secondary | ICD-10-CM | POA: Diagnosis not present

## 2021-09-02 DIAGNOSIS — Z8249 Family history of ischemic heart disease and other diseases of the circulatory system: Secondary | ICD-10-CM

## 2021-09-02 DIAGNOSIS — J449 Chronic obstructive pulmonary disease, unspecified: Secondary | ICD-10-CM | POA: Diagnosis present

## 2021-09-02 DIAGNOSIS — I700 Atherosclerosis of aorta: Secondary | ICD-10-CM | POA: Diagnosis present

## 2021-09-02 DIAGNOSIS — W19XXXA Unspecified fall, initial encounter: Secondary | ICD-10-CM | POA: Diagnosis present

## 2021-09-02 DIAGNOSIS — I7123 Aneurysm of the descending thoracic aorta, without rupture: Secondary | ICD-10-CM | POA: Diagnosis present

## 2021-09-02 DIAGNOSIS — I69398 Other sequelae of cerebral infarction: Secondary | ICD-10-CM

## 2021-09-02 DIAGNOSIS — Z20822 Contact with and (suspected) exposure to covid-19: Secondary | ICD-10-CM | POA: Diagnosis present

## 2021-09-02 DIAGNOSIS — D509 Iron deficiency anemia, unspecified: Secondary | ICD-10-CM | POA: Diagnosis present

## 2021-09-02 DIAGNOSIS — Z85118 Personal history of other malignant neoplasm of bronchus and lung: Secondary | ICD-10-CM

## 2021-09-02 DIAGNOSIS — E876 Hypokalemia: Secondary | ICD-10-CM | POA: Diagnosis present

## 2021-09-02 DIAGNOSIS — I110 Hypertensive heart disease with heart failure: Secondary | ICD-10-CM | POA: Diagnosis present

## 2021-09-02 DIAGNOSIS — E785 Hyperlipidemia, unspecified: Secondary | ICD-10-CM | POA: Diagnosis present

## 2021-09-02 DIAGNOSIS — I440 Atrioventricular block, first degree: Secondary | ICD-10-CM | POA: Diagnosis present

## 2021-09-02 DIAGNOSIS — I5022 Chronic systolic (congestive) heart failure: Secondary | ICD-10-CM | POA: Diagnosis present

## 2021-09-02 DIAGNOSIS — I10 Essential (primary) hypertension: Secondary | ICD-10-CM | POA: Diagnosis present

## 2021-09-02 DIAGNOSIS — E119 Type 2 diabetes mellitus without complications: Secondary | ICD-10-CM | POA: Diagnosis present

## 2021-09-02 DIAGNOSIS — Z7982 Long term (current) use of aspirin: Secondary | ICD-10-CM

## 2021-09-02 DIAGNOSIS — F418 Other specified anxiety disorders: Secondary | ICD-10-CM | POA: Diagnosis present

## 2021-09-02 DIAGNOSIS — I7143 Infrarenal abdominal aortic aneurysm, without rupture: Secondary | ICD-10-CM | POA: Diagnosis present

## 2021-09-02 DIAGNOSIS — F419 Anxiety disorder, unspecified: Secondary | ICD-10-CM | POA: Diagnosis present

## 2021-09-02 DIAGNOSIS — K5900 Constipation, unspecified: Secondary | ICD-10-CM | POA: Diagnosis present

## 2021-09-02 DIAGNOSIS — R531 Weakness: Secondary | ICD-10-CM

## 2021-09-02 DIAGNOSIS — Z7984 Long term (current) use of oral hypoglycemic drugs: Secondary | ICD-10-CM

## 2021-09-02 DIAGNOSIS — F32A Depression, unspecified: Secondary | ICD-10-CM | POA: Diagnosis present

## 2021-09-02 DIAGNOSIS — Z882 Allergy status to sulfonamides status: Secondary | ICD-10-CM

## 2021-09-02 DIAGNOSIS — S2242XD Multiple fractures of ribs, left side, subsequent encounter for fracture with routine healing: Secondary | ICD-10-CM

## 2021-09-02 DIAGNOSIS — I714 Abdominal aortic aneurysm, without rupture, unspecified: Secondary | ICD-10-CM | POA: Diagnosis present

## 2021-09-02 DIAGNOSIS — I69354 Hemiplegia and hemiparesis following cerebral infarction affecting left non-dominant side: Secondary | ICD-10-CM

## 2021-09-02 DIAGNOSIS — Z7902 Long term (current) use of antithrombotics/antiplatelets: Secondary | ICD-10-CM

## 2021-09-02 DIAGNOSIS — Z79899 Other long term (current) drug therapy: Secondary | ICD-10-CM

## 2021-09-02 LAB — URINALYSIS, ROUTINE W REFLEX MICROSCOPIC
Bilirubin Urine: NEGATIVE
Glucose, UA: NEGATIVE mg/dL
Hgb urine dipstick: NEGATIVE
Ketones, ur: NEGATIVE mg/dL
Leukocytes,Ua: NEGATIVE
Nitrite: NEGATIVE
Protein, ur: NEGATIVE mg/dL
Specific Gravity, Urine: 1.017 (ref 1.005–1.030)
pH: 6 (ref 5.0–8.0)

## 2021-09-02 LAB — HEPATIC FUNCTION PANEL
ALT: 21 U/L (ref 0–44)
AST: 28 U/L (ref 15–41)
Albumin: 3.1 g/dL — ABNORMAL LOW (ref 3.5–5.0)
Alkaline Phosphatase: 142 U/L — ABNORMAL HIGH (ref 38–126)
Bilirubin, Direct: <0.1 mg/dL (ref 0.0–0.2)
Total Bilirubin: 0.7 mg/dL (ref 0.3–1.2)
Total Protein: 6.7 g/dL (ref 6.5–8.1)

## 2021-09-02 LAB — BASIC METABOLIC PANEL
Anion gap: 11 (ref 5–15)
BUN: 17 mg/dL (ref 8–23)
CO2: 29 mmol/L (ref 22–32)
Calcium: 9 mg/dL (ref 8.9–10.3)
Chloride: 98 mmol/L (ref 98–111)
Creatinine, Ser: 0.71 mg/dL (ref 0.44–1.00)
GFR, Estimated: >60 mL/min (ref >60)
Glucose, Bld: 111 mg/dL — ABNORMAL HIGH (ref 70–99)
Potassium: 3.4 mmol/L — ABNORMAL LOW (ref 3.5–5.1)
Sodium: 138 mmol/L (ref 135–145)

## 2021-09-02 LAB — CBC
HCT: 35.3 % — ABNORMAL LOW (ref 36.0–46.0)
Hemoglobin: 10.5 g/dL — ABNORMAL LOW (ref 12.0–15.0)
MCH: 24.5 pg — ABNORMAL LOW (ref 26.0–34.0)
MCHC: 29.7 g/dL — ABNORMAL LOW (ref 30.0–36.0)
MCV: 82.3 fL (ref 80.0–100.0)
Platelets: 523 10*3/uL — ABNORMAL HIGH (ref 150–400)
RBC: 4.29 MIL/uL (ref 3.87–5.11)
RDW: 21.8 % — ABNORMAL HIGH (ref 11.5–15.5)
WBC: 8.6 10*3/uL (ref 4.0–10.5)
nRBC: 0 % (ref 0.0–0.2)

## 2021-09-02 LAB — RESP PANEL BY RT-PCR (FLU A&B, COVID) ARPGX2
Influenza A by PCR: NEGATIVE
Influenza B by PCR: NEGATIVE
SARS Coronavirus 2 by RT PCR: NEGATIVE

## 2021-09-02 LAB — MAGNESIUM: Magnesium: 1.7 mg/dL (ref 1.7–2.4)

## 2021-09-02 LAB — TROPONIN I (HIGH SENSITIVITY): Troponin I (High Sensitivity): 7 ng/L (ref <18)

## 2021-09-02 LAB — TSH: TSH: 0.608 u[IU]/mL (ref 0.350–4.500)

## 2021-09-02 IMAGING — CT CT ABD-PELV W/ CM
4 of 5 series · 16 of 36 positions shown, 17 images · IV contrast (APPLIED)
Comparison: [DATE], [DATE], [DATE]

CLINICAL DATA: Fell 3 days ago, right-sided back pain, weakness and
constipation for 2 weeks, history of right upper lobe lung cancer

EXAM:
CT ABDOMEN AND PELVIS WITH CONTRAST
TECHNIQUE: Multidetector CT imaging of the abdomen and pelvis was performed
using the standard protocol following bolus administration of
intravenous contrast.

[Series 3: abdomen 5.0 · axial · 0.67mm/px · z∈[-743,-638]mm · 3 of 93 slices shown]
[im 8/93  lung]
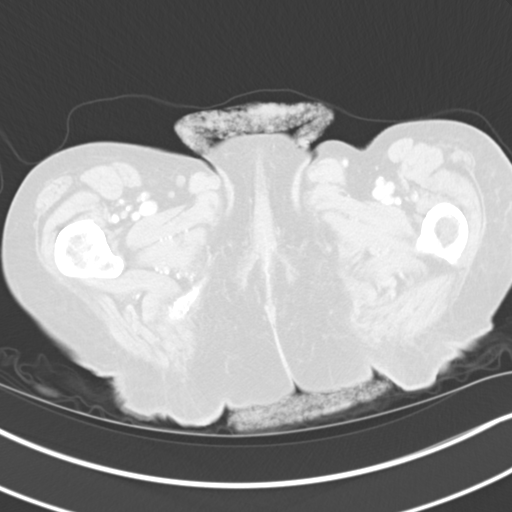
[im 22/93  lung]
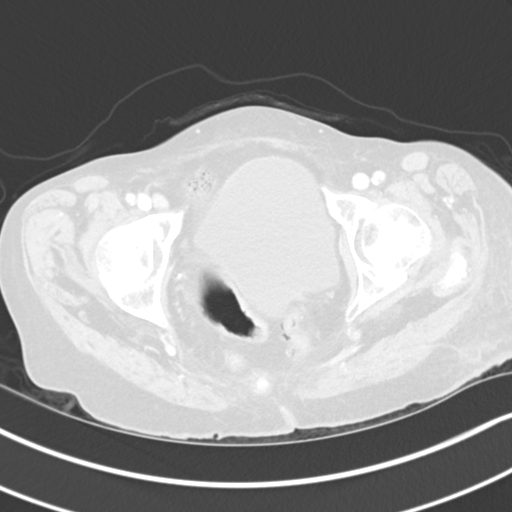
[im 29/93  lung]
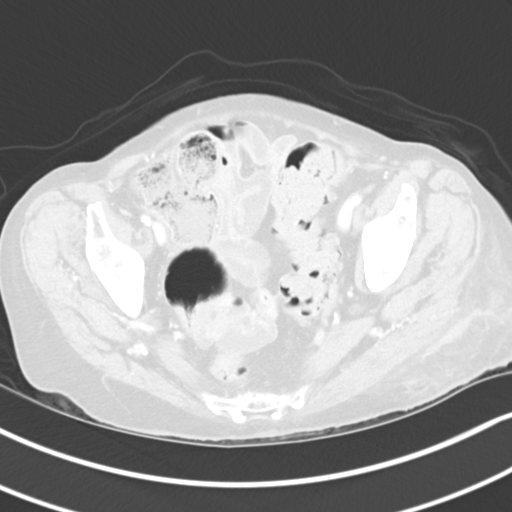

[Series 5: lung · axial · 0.67mm/px · z∈[-402,-332]mm · 6 of 49 slices shown]
[im 7/49  lung]
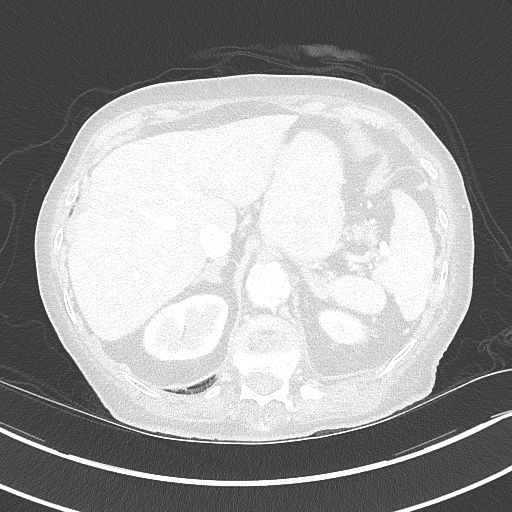
[im 14/49  lung]
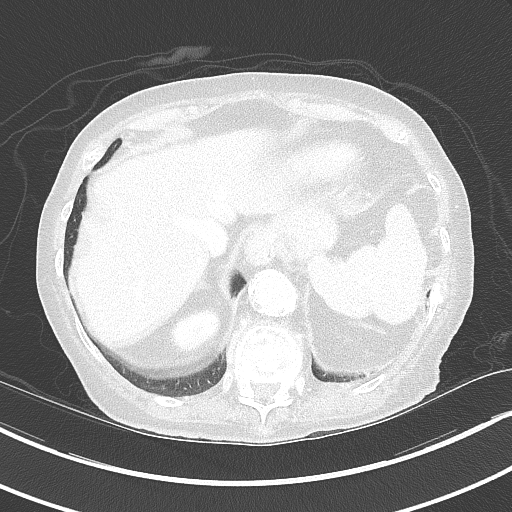
[im 21/49  lung]
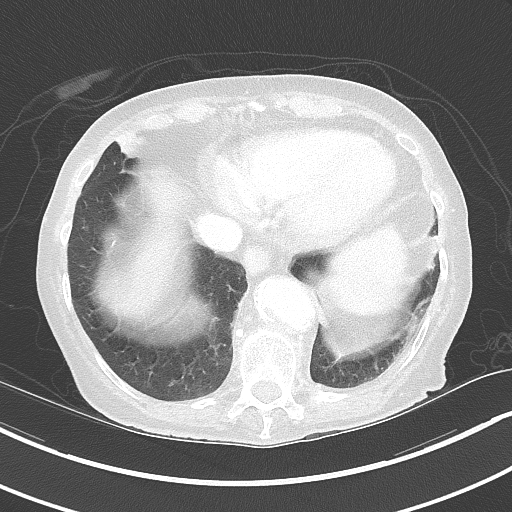
[im 28/49  lung]
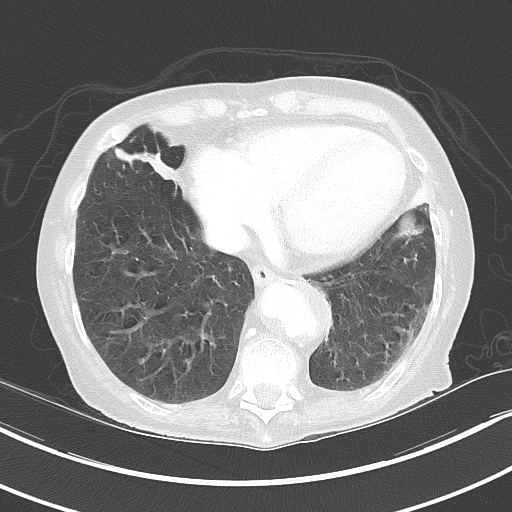
[im 35/49  lung]
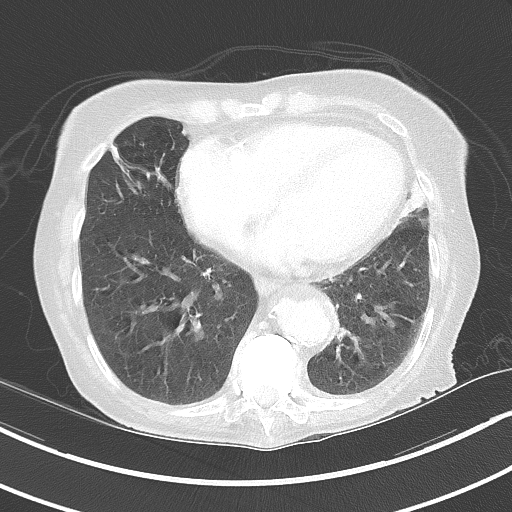
[im 42/49  lung]
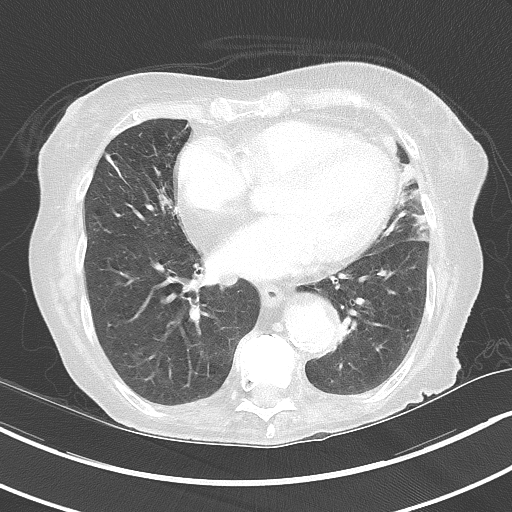

[Series 6: abdomen 3.0 mpr cor · coronal · 0.74mm/px · 3 of 88 slices shown]
[im 18/88  lung]
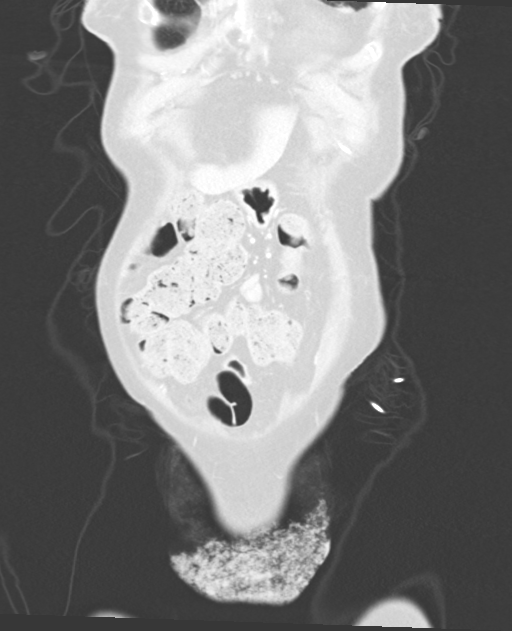
[im 35/88  lung]
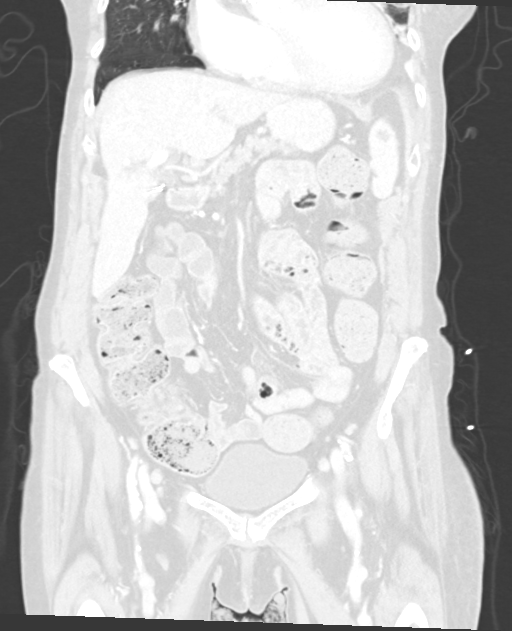
[im 53/88  lung]
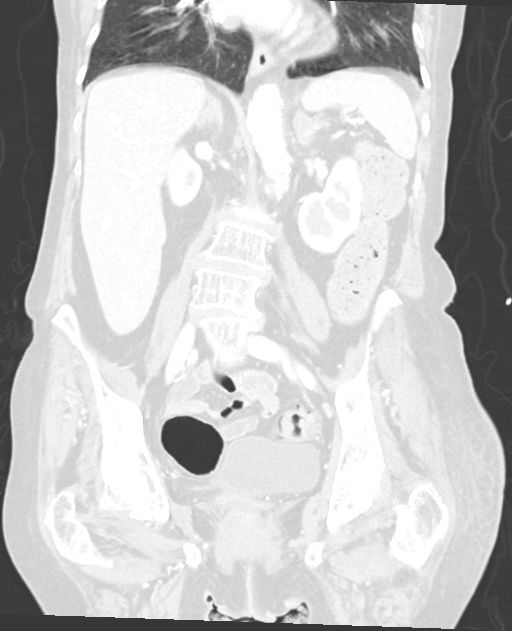

[Series 8: delay 5.0 br40 1 · axial · delayed · 0.67mm/px · z∈[-514,-409]mm · 4 of 35 slices shown, 5 images]
[im 7/35  mediastinal]
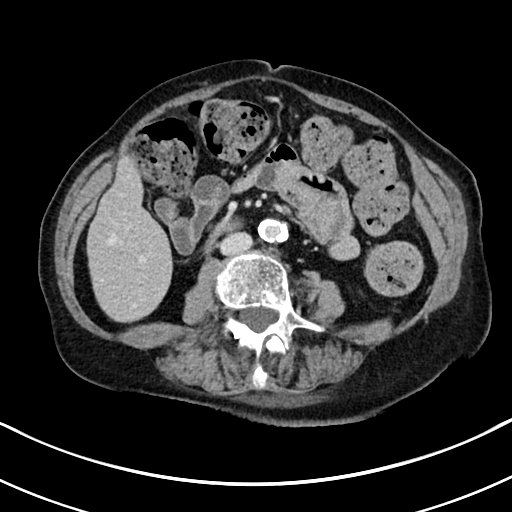
[im 7/35  lung]
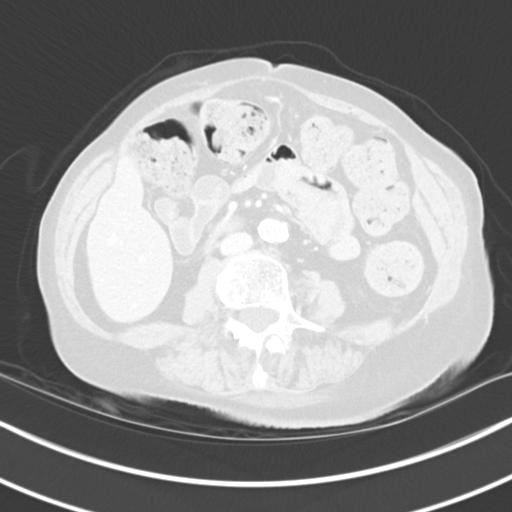
[im 14/35  lung]
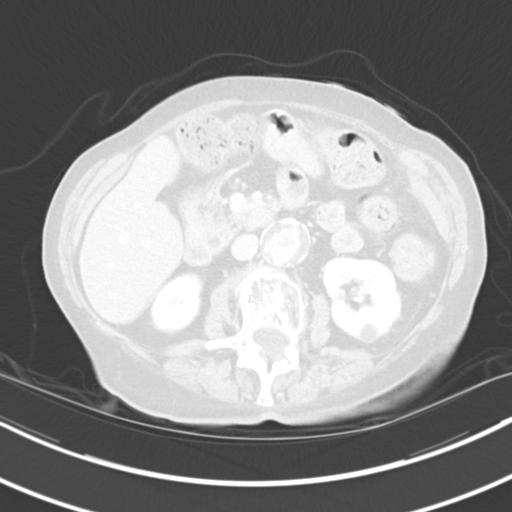
[im 21/35  lung]
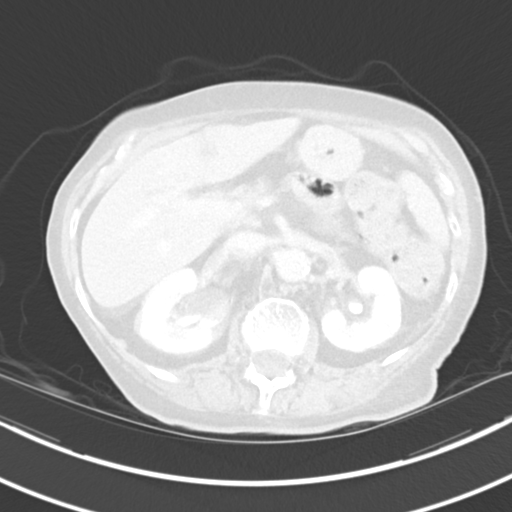
[im 28/35  lung]
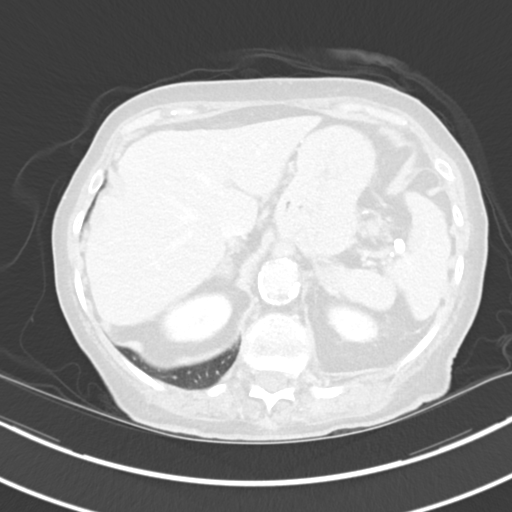

[16 of 36 positions shown; findings below may reference images not displayed]

RADIATION DOSE REDUCTION: This exam was performed according to the
departmental dose-optimization program which includes automated
exposure control, adjustment of the mA and/or kV according to
patient size and/or use of iterative reconstruction technique.

CONTRAST:  100mL OMNIPAQUE IOHEXOL 300 MG/ML  SOLN
FINDINGS: Lower chest: Bibasilar scarring. No acute pleural or parenchymal
lung disease. Stable 4 cm descending thoracic aortic aneurysm just
proximal to the diaphragmatic hiatus. Stable extensive mural
thrombus.

Hepatobiliary: Cholecystectomy. Unremarkable appearance of the
liver. No biliary duct dilation.

Pancreas: Unremarkable. No pancreatic ductal dilatation or
surrounding inflammatory changes.

Spleen: Normal in size without focal abnormality.

Adrenals/Urinary Tract: Kidneys enhance normally and symmetrically.
No solid renal lesions. No urinary tract calculi or obstructive
uropathy. Bladder is moderately distended without focal abnormality.
Stable 1.2 cm left adrenal adenoma. No specific follow-up is
required. Right adrenal is unremarkable.

Stomach/Bowel: No bowel obstruction or ileus. There is moderate
retained stool throughout the colon consistent with constipation. No
bowel wall thickening or inflammatory change.

Vascular/Lymphatic: 3.3 cm infrarenal abdominal aortic aneurysm
unchanged. There is extensive atherosclerosis and mural thrombus
throughout the abdominal aorta, unchanged since prior examinations.

No pathologic adenopathy within the abdomen or pelvis.

Reproductive: Status post hysterectomy. No adnexal masses.

Other: No free fluid or free intraperitoneal gas. No abdominal wall
hernia.

Musculoskeletal: Prior T8 and T9 compression deformities with
vertebral augmentation. T11 compression deformity unchanged since
prior chest x-ray [DATE]. Subacute healing left posterior tenth
through twelfth rib fractures are noted. There are no acute bony
abnormalities.

Subcutaneous fat stranding in the left gluteal region likely
reflects ecchymosis related to trauma. Reconstructed images
demonstrate no additional findings.
IMPRESSION: 1. Moderate fecal retention throughout the colon consistent with
constipation.
2. Stable 4 cm descending thoracic aortic aneurysm and 3.3 cm
infrarenal abdominal aortic aneurysm. Recommend follow-up every 3
years.
Reference: [HOSPITAL] [03];[DATE].
3. Stable compression fractures at T8, T9, and T11. No change since
prior chest x-ray.
4. Subacute healing left posterior tenth through twelfth rib
fractures. No acute rib fractures.
5. Left gluteal subcutaneous fat stranding consistent with
posttraumatic ecchymosis.
6. Aortic Atherosclerosis ([03]-[03]). Extensive mural thrombus
throughout the visualized thoracoabdominal aorta.

## 2021-09-02 IMAGING — CT CT T SPINE W/O CM
3 of 4 series · 10 of 33 positions shown, 12 images · non-contrast
Comparison: CT chest angiogram, [DATE], CT lumbar spine,
[DATE]

CLINICAL DATA: Multiple falls,, right-sided back pain recent
left-sided rib fractures

EXAM:
CT THORACIC AND LUMBAR SPINE WITHOUT CONTRAST
TECHNIQUE: Multidetector CT imaging of the thoracic and lumbar spine was
performed without intravenous contrast administration. Multiplanar
CT image reconstructions were also generated.
RADIATION DOSE REDUCTION: This exam was performed according to the
departmental dose-optimization program which includes automated
exposure control, adjustment of the mA and/or kV according to
patient size and/or use of iterative reconstruction technique.

[Series 7: cor bone · coronal · 0.29mm/px · 3 of 79 slices shown]
[im 16/79  bone]
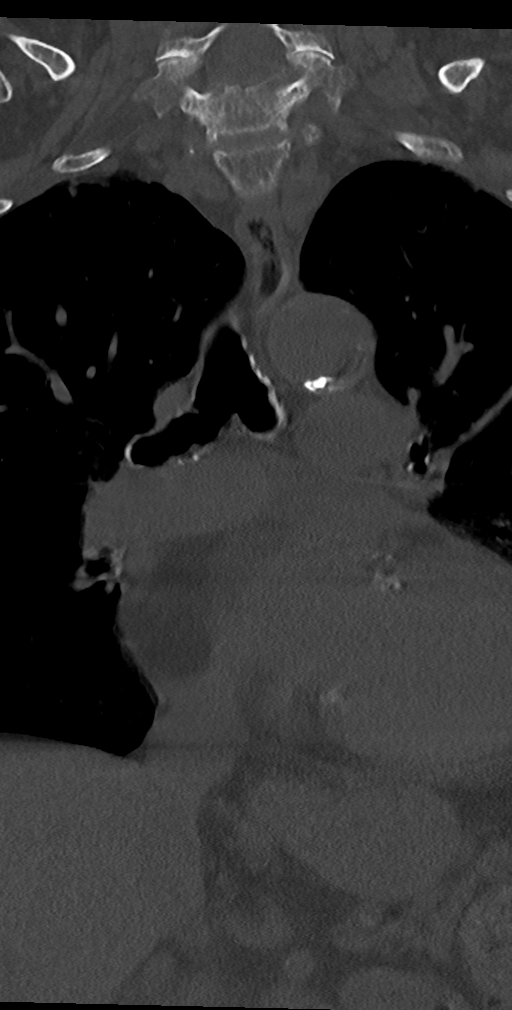
[im 32/79  bone]
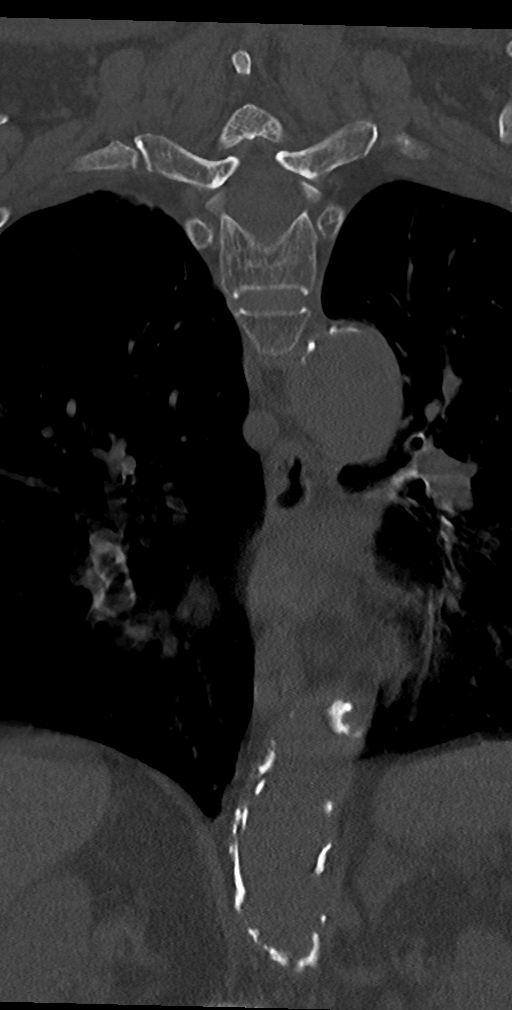
[im 47/79  bone]
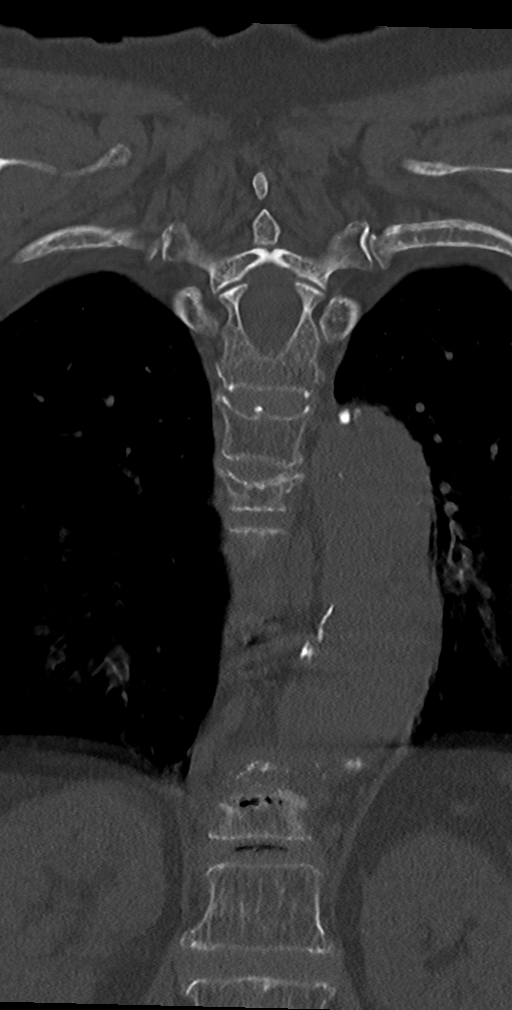

[Series 8: sag bone · sagittal · 0.37mm/px · 5 of 40 slices shown, 6 images]
[im 14/40  bone]
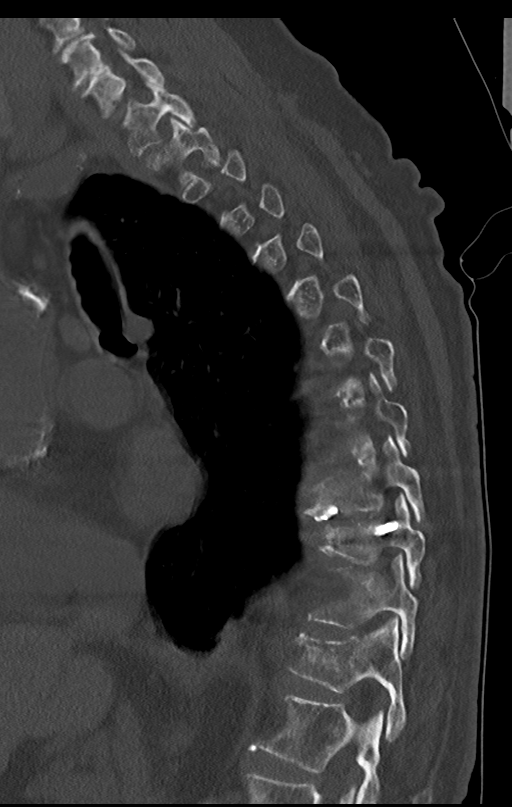
[im 17/40  bone]
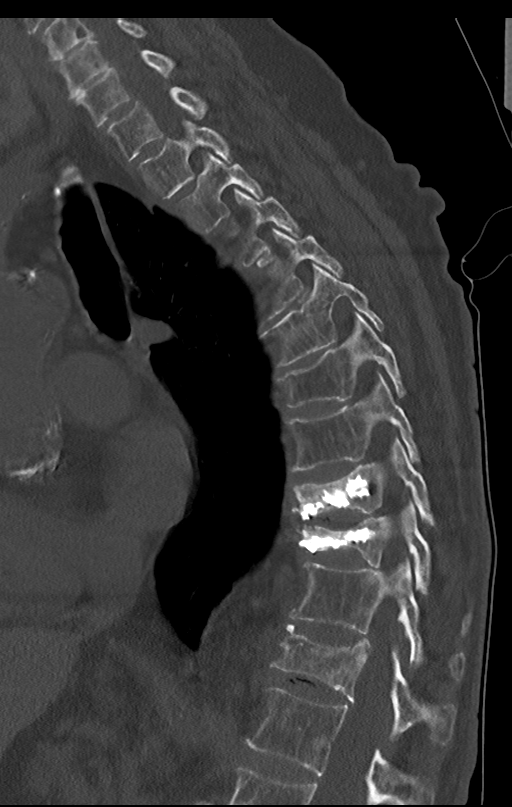
[im 20/40  soft-tissue]
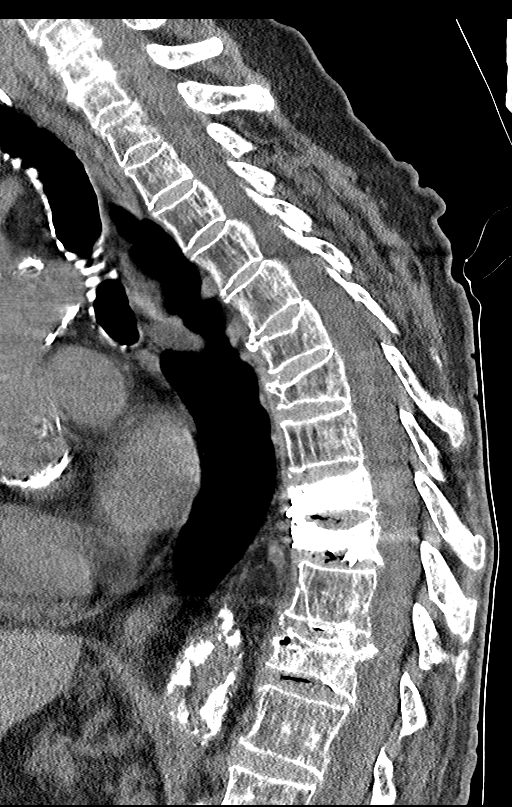
[im 20/40  bone]
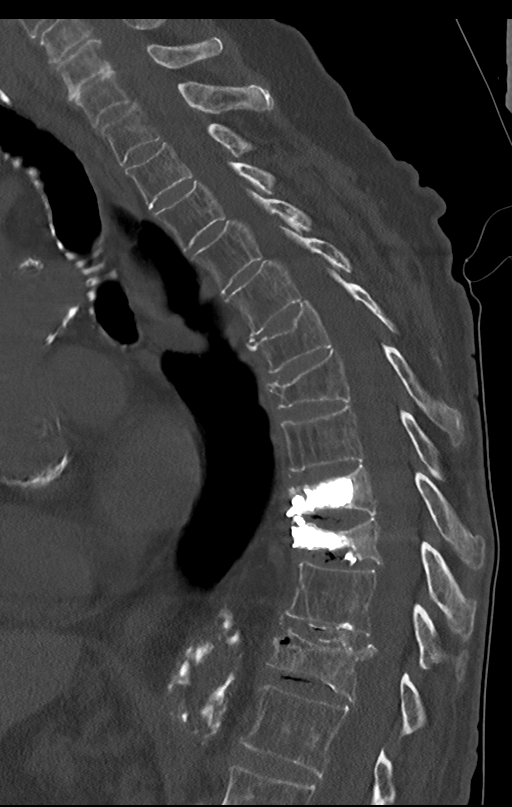
[im 23/40  bone]
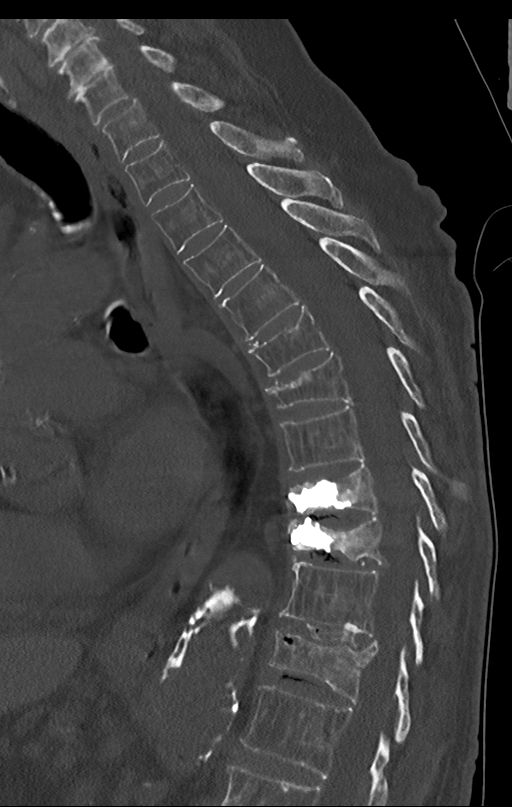
[im 27/40  bone]
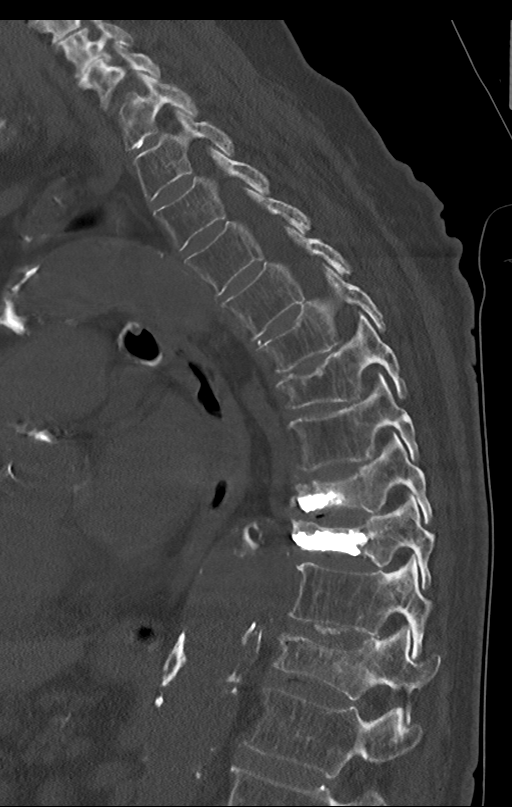

[Series 9: orthogonal bone · axial · 0.21mm/px · z∈[-337,-266]mm · 2 of 150 slices shown, 3 images]
[im 50/150  soft-tissue]
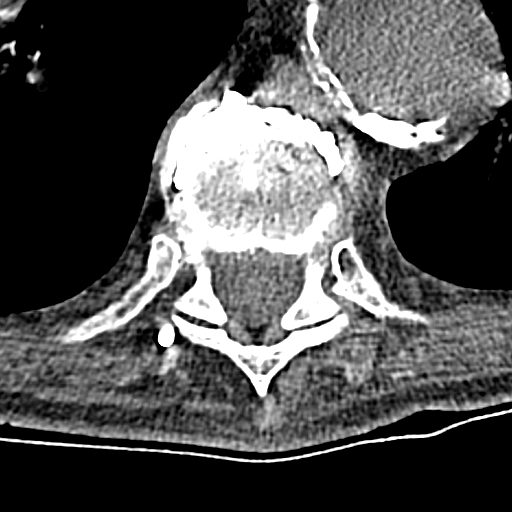
[im 50/150  bone]
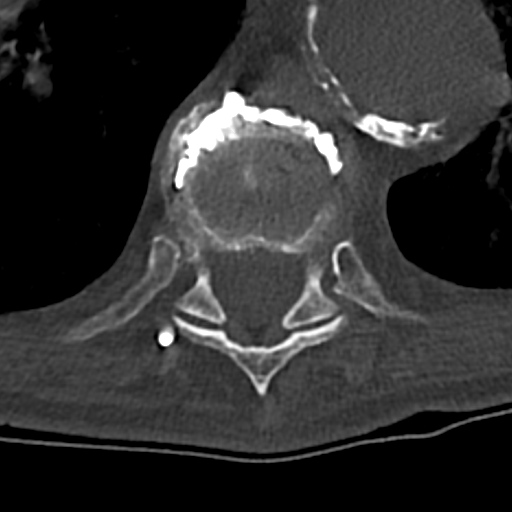
[im 100/150  bone]
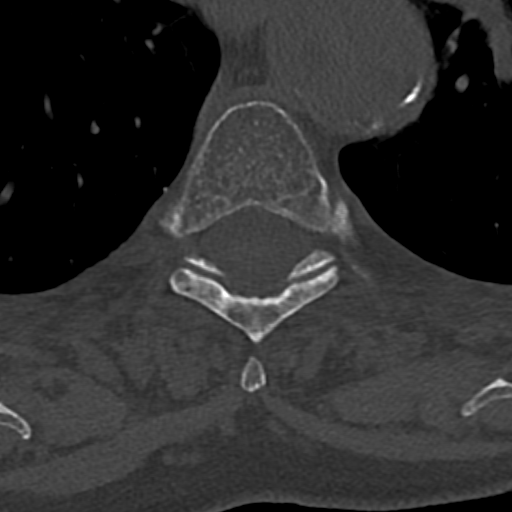

[10 of 33 positions shown; findings below may reference images not displayed]

FINDINGS: Segmentation: Twelve rib bearing vertebral bodies and six lumbar
type vertebral bodies.

Alignment: Normal thoracic kyphosis and lumbar lordosis.

Vertebrae: Osteopenia. There is a new wedge deformity of the T12
vertebral body, with approximately 40% anterior height loss (series
8, image 22). Nonacute wedge deformities of T6, T7 T9, and T10,
status post vertebral cement augmentation of T9 and 10. No fracture
or dislocation of the lumbar vertebral bodies. Minimally displaced,
subacute fractures of the posterior left eleventh and twelfth ribs
(series 9, image 116, 129).

Disc levels: Mild multilevel disc space height loss and
osteophytosis throughout the thoracic spine. Focally severe disc
degenerative disease of L2-L3.

Paraspinal and other soft tissues: Severe aortic atherosclerosis.
Aneurysm of the descending thoracic aorta measuring up to 4.1 x
cm (series 7, image 44, series 3, image 102). Unchanged focal
dissection and penetrating ulceration of the infrarenal abdominal
aorta, aneurysmal measuring up to 3.4 x 3.3 cm (series 4, image 8,
series 2, image 40).
IMPRESSION: 1. New wedge deformity of the T12 vertebral body, with approximately
40% anterior height loss.
2. Nonacute wedge deformities of T6, T7 T9, and T10, status post
vertebral cement augmentation of T9 and T10.
3. Minimally displaced, subacute fractures of the posterior left
eleventh and twelfth ribs.
4. Severe aortic atherosclerosis. Unchanged focal dissection and
penetrating ulceration of the infrarenal abdominal aorta, aneurysmal
measuring up to 3.4 x 3.3 cm. Aneurysm of the descending thoracic
aorta measuring up to 4.1 x 4.0 cm. Recommend follow-up every 12
months and vascular consultation if clinically appropriate and not
otherwise imaged. This recommendation follows ACR consensus
guidelines: White Paper of the ACR Incidental Findings Committee II

Aortic Atherosclerosis ([AJ]-[AJ]).

## 2021-09-02 IMAGING — CT CT L SPINE W/O CM
4 of 6 series · 13 of 33 positions shown, 15 images · IV contrast (APPLIED)
Comparison: CT chest angiogram, [DATE], CT lumbar spine,
[DATE]

CLINICAL DATA: Multiple falls,, right-sided back pain recent
left-sided rib fractures

EXAM:
CT THORACIC AND LUMBAR SPINE WITHOUT CONTRAST
TECHNIQUE: Multidetector CT imaging of the thoracic and lumbar spine was
performed without intravenous contrast administration. Multiplanar
CT image reconstructions were also generated.
RADIATION DOSE REDUCTION: This exam was performed according to the
departmental dose-optimization program which includes automated
exposure control, adjustment of the mA and/or kV according to
patient size and/or use of iterative reconstruction technique.

[Series 1: l-spine bone · axial · 0.30mm/px · z∈[-598,-442]mm · 5 of 118 slices shown, 7 images]
[im 20/118  soft-tissue]
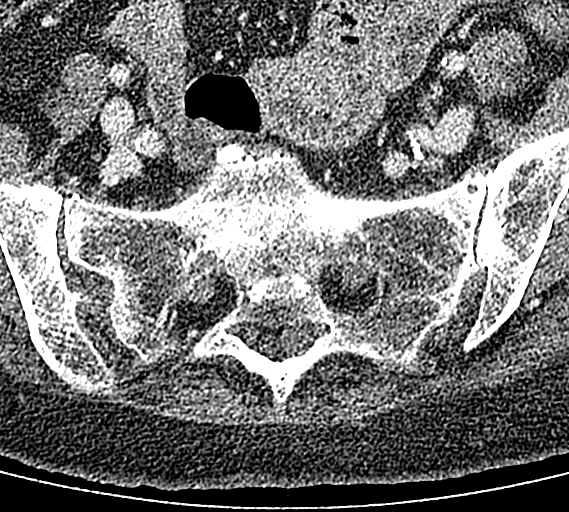
[im 20/118  bone]
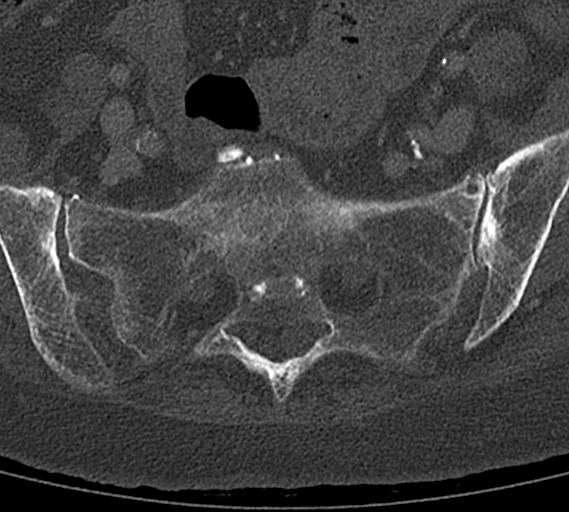
[im 40/118  bone]
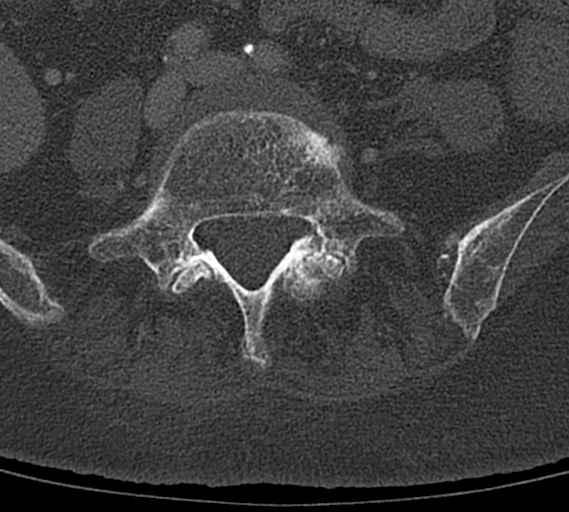
[im 59/118  bone]
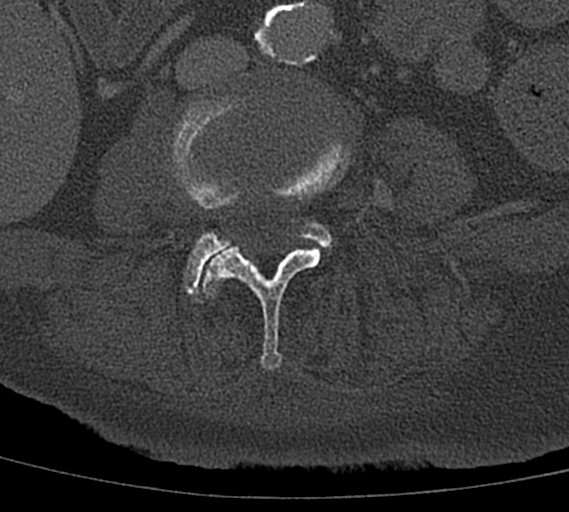
[im 79/118  bone]
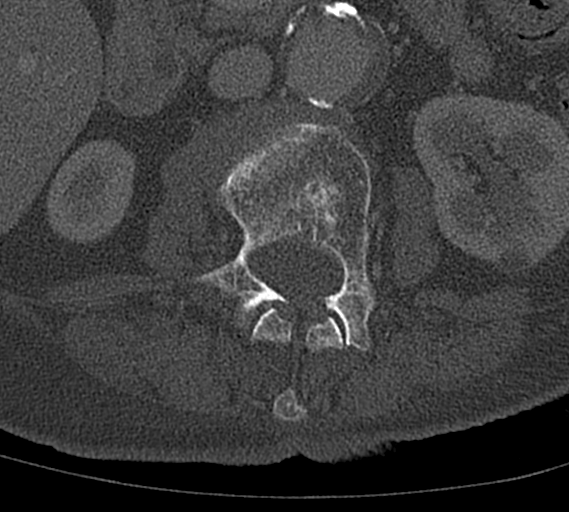
[im 98/118  soft-tissue]
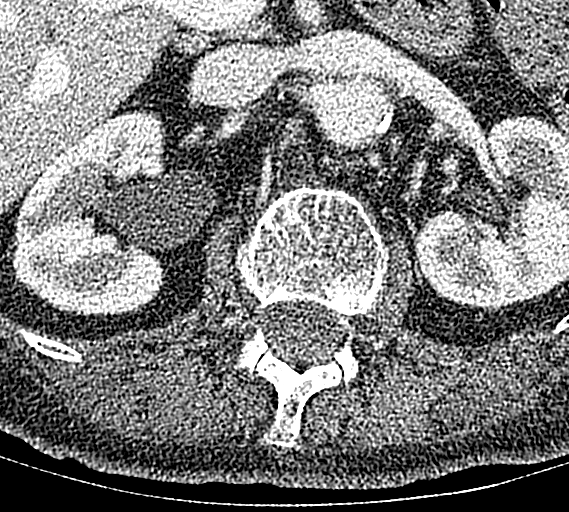
[im 98/118  bone]
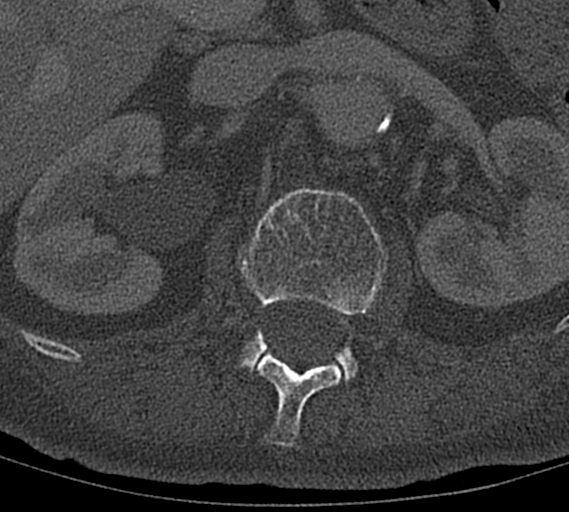

[Series 2: l-spine st · axial · 0.30mm/px · z∈[-598,-558]mm · 2 of 118 slices shown]
[im 20/118  bone]
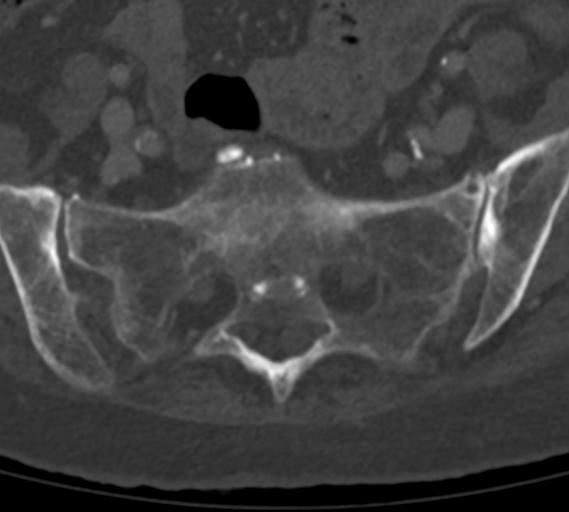
[im 40/118  bone]
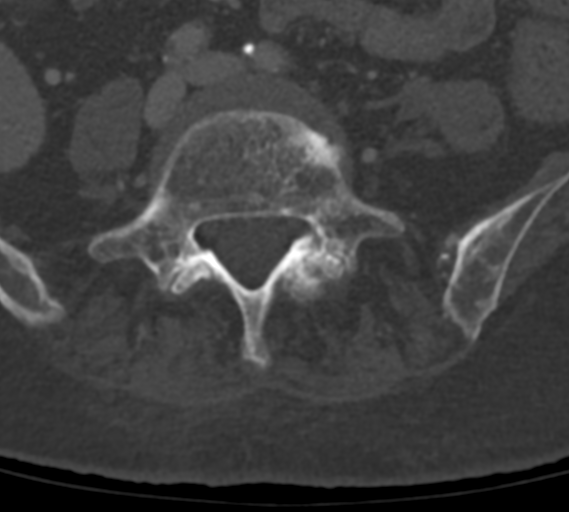

[Series 4: l-spine bone coronal · coronal · 0.33mm/px · 1 of 67 slices shown]
[im 34/67  bone]
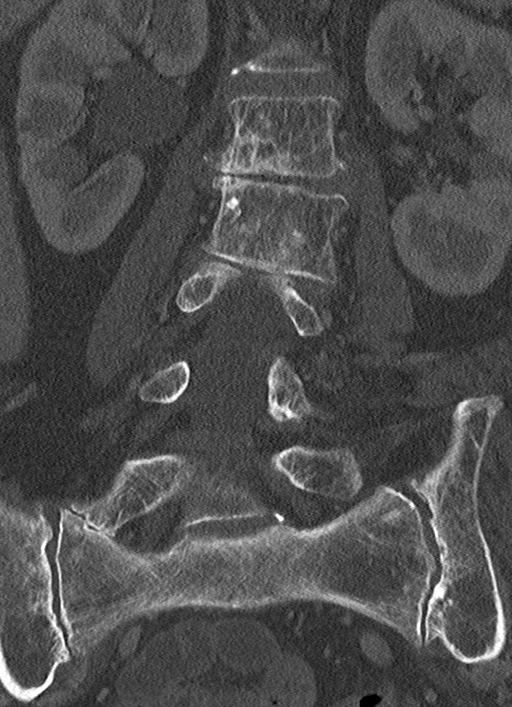

[Series 5: l-spine bone sag · sagittal · 0.35mm/px · 5 of 71 slices shown]
[im 12/71  bone]
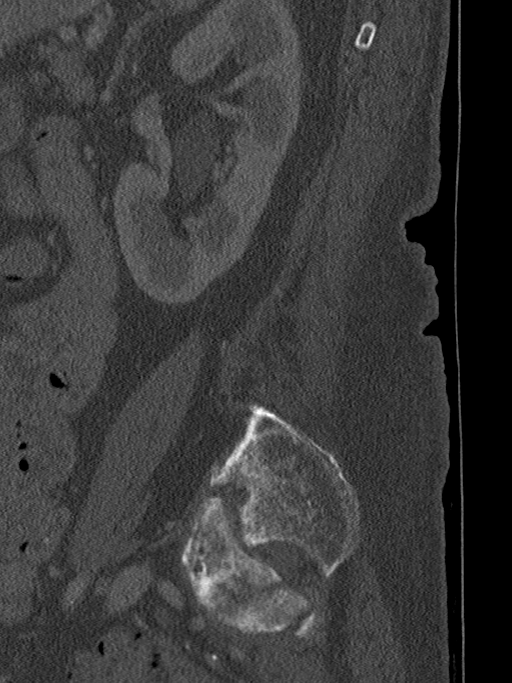
[im 24/71  bone]
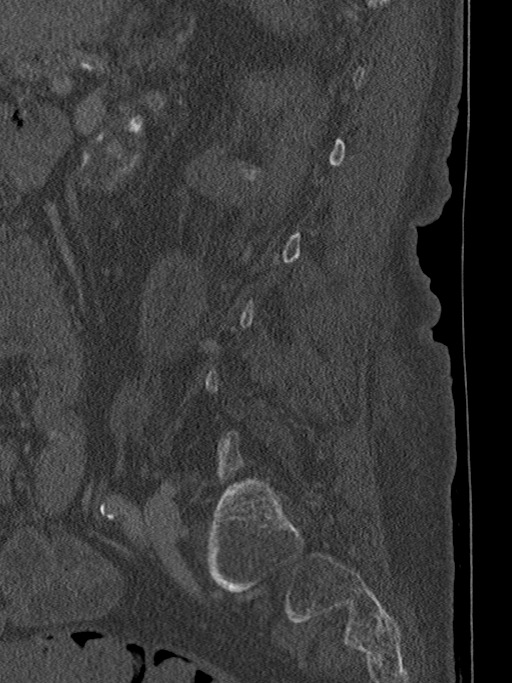
[im 36/71  bone]
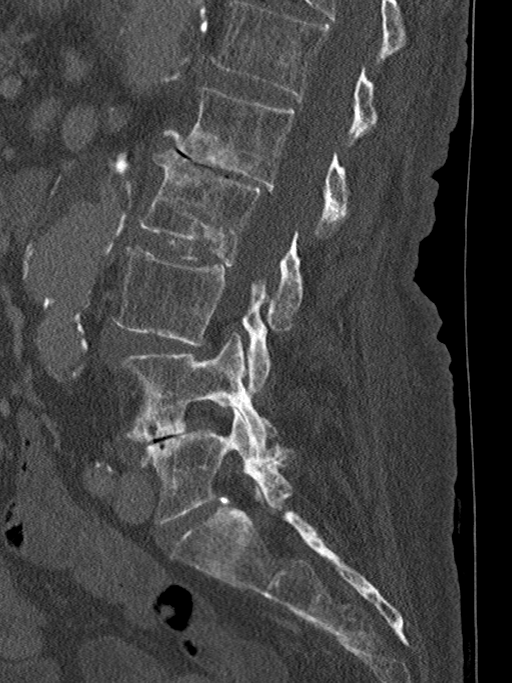
[im 47/71  bone]
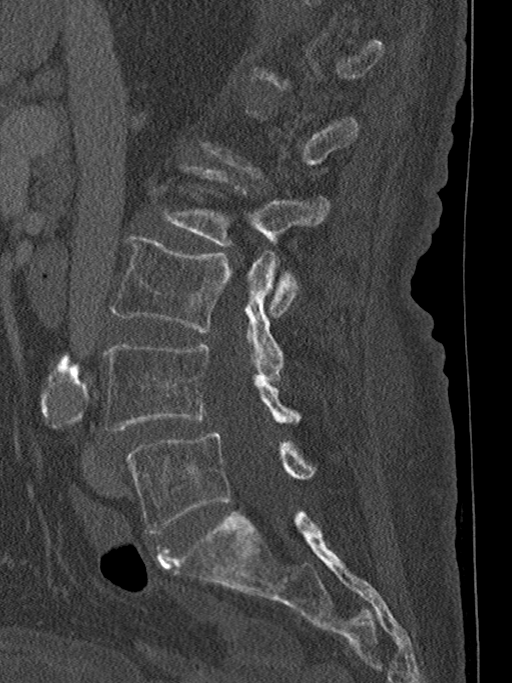
[im 59/71  bone]
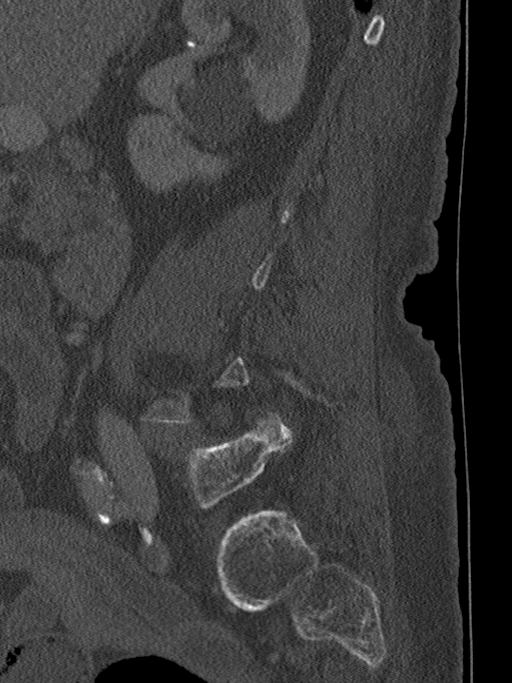

[13 of 33 positions shown; findings below may reference images not displayed]

FINDINGS: Segmentation: Twelve rib bearing vertebral bodies and six lumbar
type vertebral bodies.

Alignment: Normal thoracic kyphosis and lumbar lordosis.

Vertebrae: Osteopenia. There is a new wedge deformity of the T12
vertebral body, with approximately 40% anterior height loss (series
8, image 22). Nonacute wedge deformities of T6, T7 T9, and T10,
status post vertebral cement augmentation of T9 and 10. No fracture
or dislocation of the lumbar vertebral bodies. Minimally displaced,
subacute fractures of the posterior left eleventh and twelfth ribs
(series 9, image 116, 129).

Disc levels: Mild multilevel disc space height loss and
osteophytosis throughout the thoracic spine. Focally severe disc
degenerative disease of L2-L3.

Paraspinal and other soft tissues: Severe aortic atherosclerosis.
Aneurysm of the descending thoracic aorta measuring up to 4.1 x
cm (series 7, image 44, series 3, image 102). Unchanged focal
dissection and penetrating ulceration of the infrarenal abdominal
aorta, aneurysmal measuring up to 3.4 x 3.3 cm (series 4, image 8,
series 2, image 40).
IMPRESSION: 1. New wedge deformity of the T12 vertebral body, with approximately
40% anterior height loss.
2. Nonacute wedge deformities of T6, T7 T9, and T10, status post
vertebral cement augmentation of T9 and T10.
3. Minimally displaced, subacute fractures of the posterior left
eleventh and twelfth ribs.
4. Severe aortic atherosclerosis. Unchanged focal dissection and
penetrating ulceration of the infrarenal abdominal aorta, aneurysmal
measuring up to 3.4 x 3.3 cm. Aneurysm of the descending thoracic
aorta measuring up to 4.1 x 4.0 cm. Recommend follow-up every 12
months and vascular consultation if clinically appropriate and not
otherwise imaged. This recommendation follows ACR consensus
guidelines: White Paper of the ACR Incidental Findings Committee II

Aortic Atherosclerosis ([AJ]-[AJ]).

## 2021-09-02 IMAGING — CT CT HEAD W/O CM
3 of 4 series · 13 of 47 positions shown, 15 images · non-contrast
Comparison: None.

CLINICAL DATA: Multiple recent falls



[Series 3: head wo · axial · 0.32mm/px · z∈[-64,+50]mm · 7 of 32 slices shown, 9 images]
[im 4/32  brain]
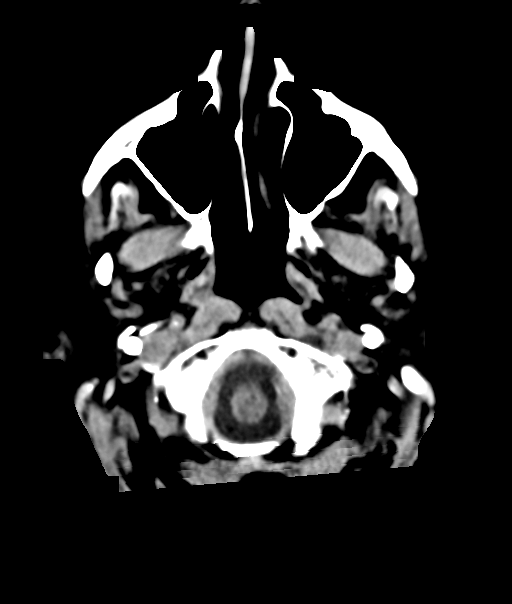
[im 4/32  bone]
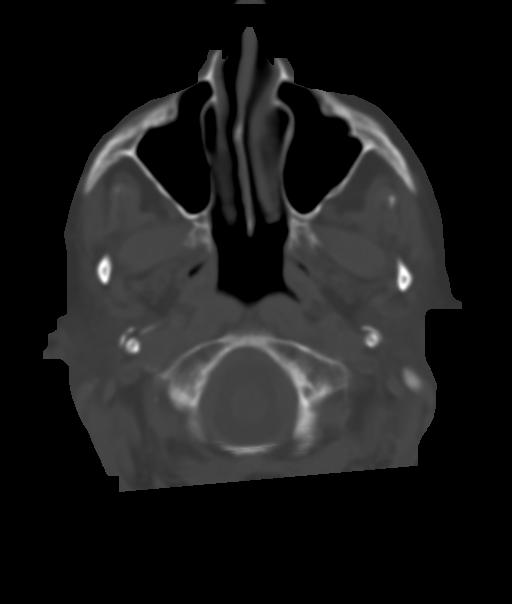
[im 8/32  brain]
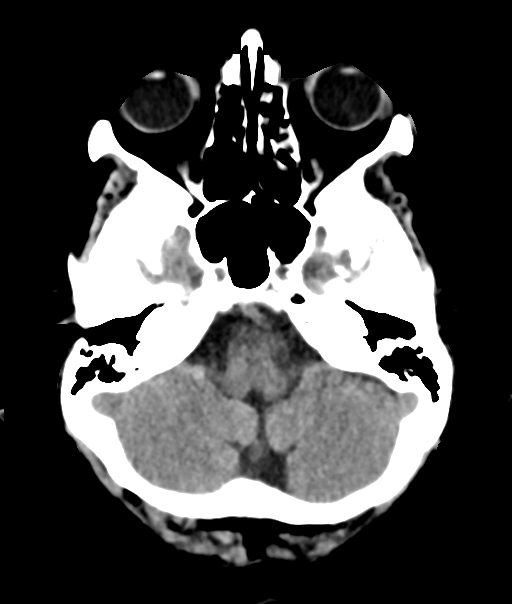
[im 12/32  brain]
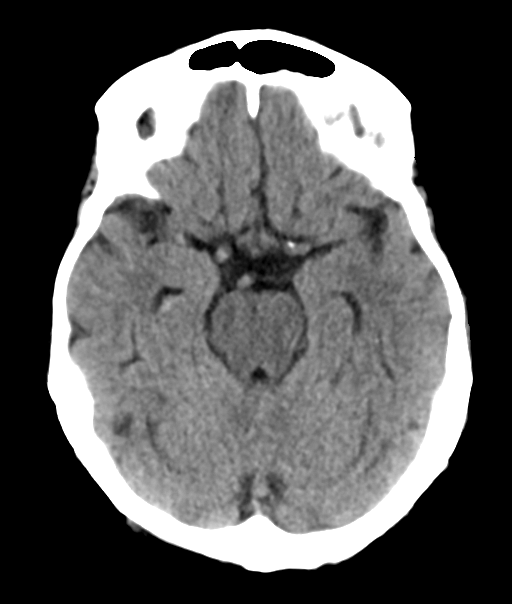
[im 16/32  brain]
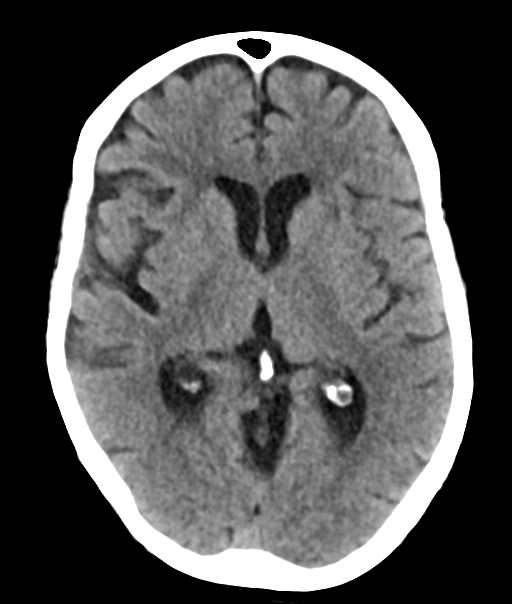
[im 20/32  brain]
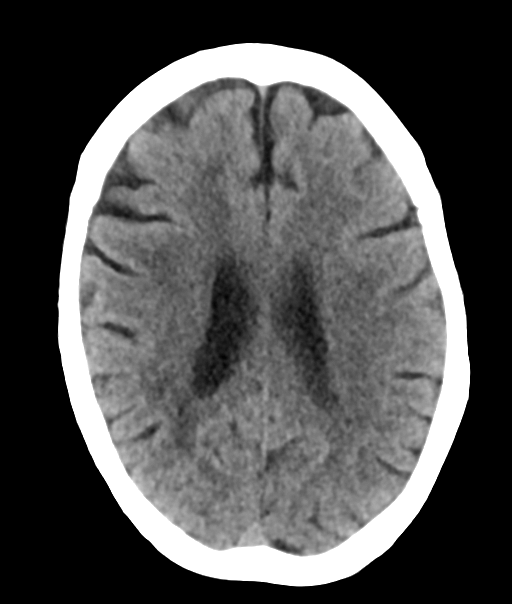
[im 20/32  bone]
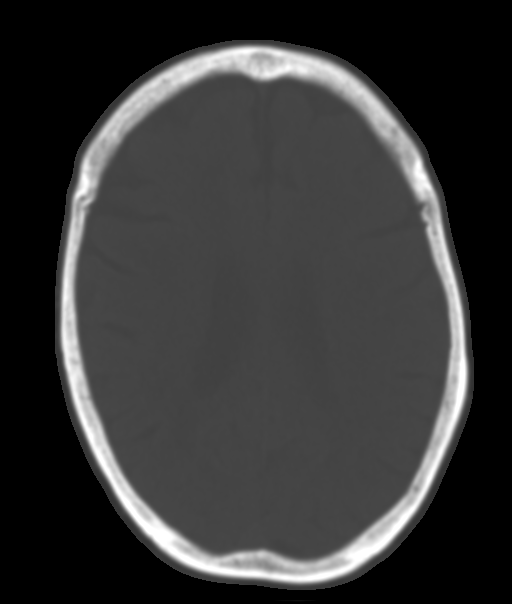
[im 24/32  brain]
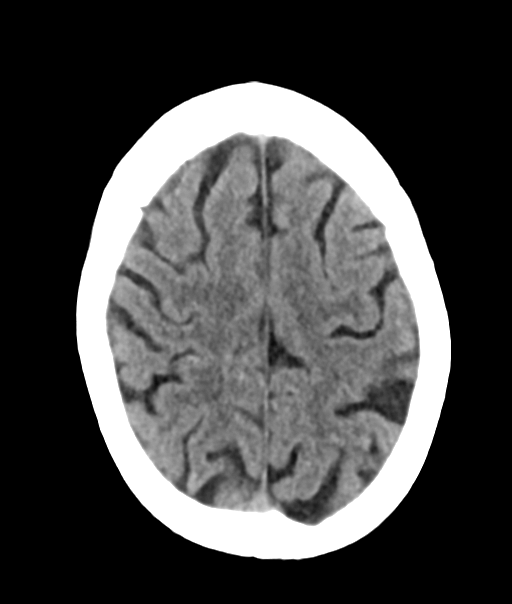
[im 28/32  brain]
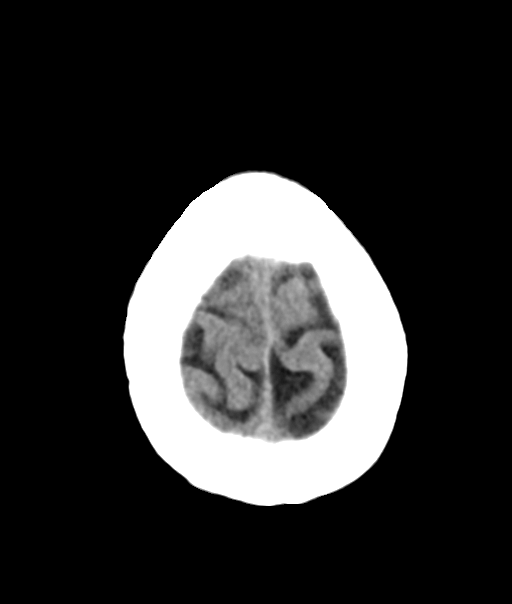

[Series 5: cor soft · coronal · 0.32mm/px · 3 of 64 slices shown]
[im 22/64  brain]
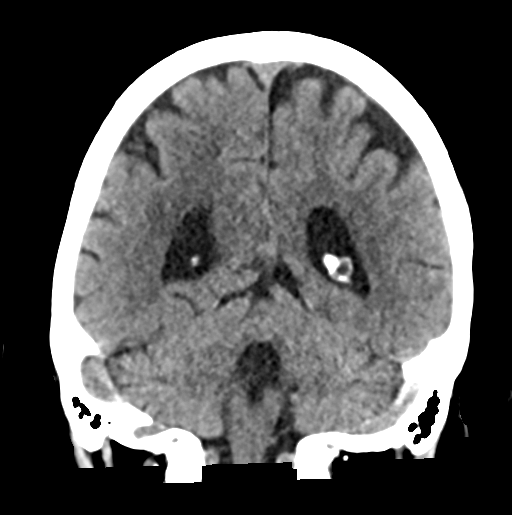
[im 29/64  brain]
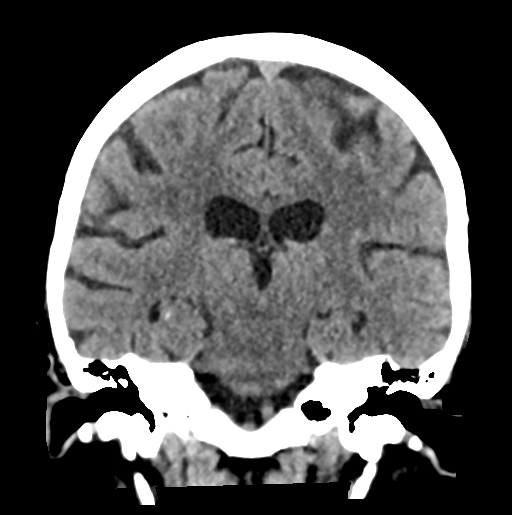
[im 36/64  brain]
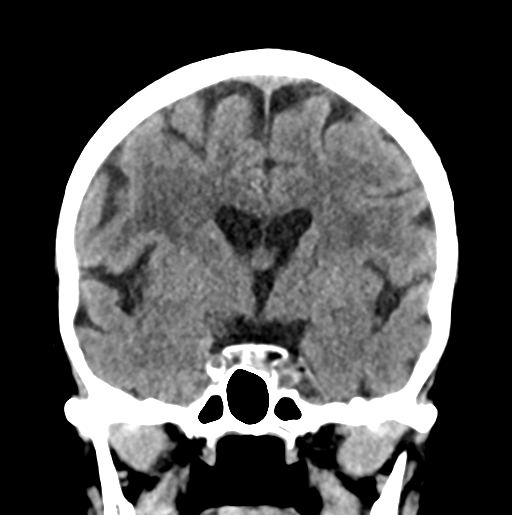

[Series 6: sag soft · sagittal · 0.31mm/px · 3 of 53 slices shown]
[im 18/53  brain]
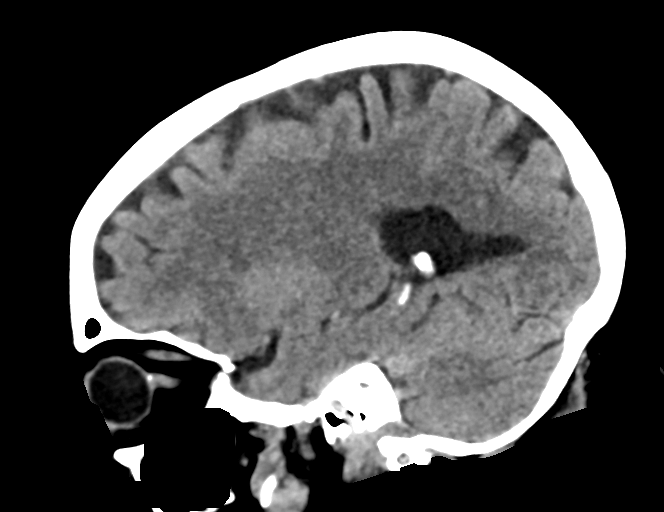
[im 27/53  brain]
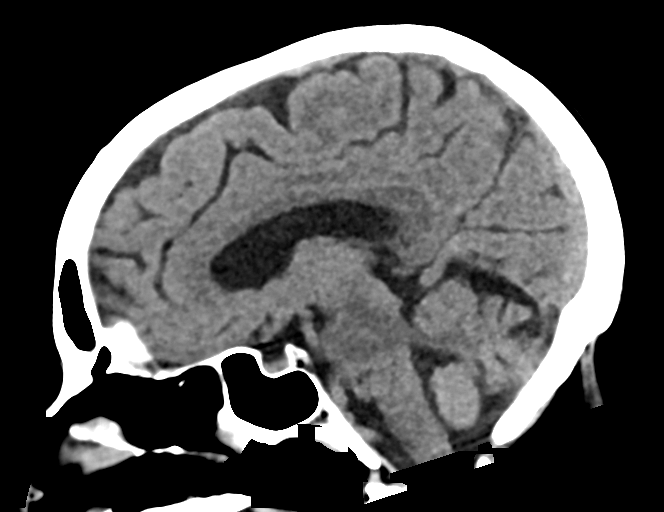
[im 35/53  brain]
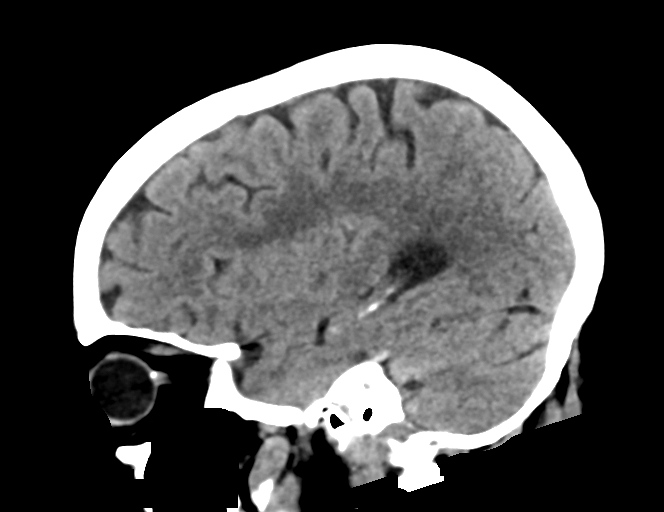

[13 of 47 positions shown; findings below may reference images not displayed]

FINDINGS: CT HEAD FINDINGS

Brain: No evidence of acute infarction, hemorrhage, hydrocephalus,
extra-axial collection or mass lesion/mass effect. Periventricular
and deep white matter hypodensity. Small nonacute lacunar infarction
of the posterior limb of the right internal capsule (series 3, image
15).

Vascular: No hyperdense vessel or unexpected calcification.

Skull: Normal. Negative for fracture or focal lesion.

Sinuses/Orbits: No acute finding.

Other: None.

CT CERVICAL SPINE FINDINGS

Alignment: Degenerative and positional straightening of the normal
cervical lordosis.

Skull base and vertebrae: No acute fracture. No primary bone lesion
or focal pathologic process.

Soft tissues and spinal canal: No prevertebral fluid or swelling. No
visible canal hematoma.

Disc levels: Moderate to severe disc space height loss and
osteophytosis of the lower cervical spine, worst from C4 through C7.

Upper chest: Negative.

Other: None.
IMPRESSION: 1. No acute intracranial pathology. Small-vessel white matter
disease. Small nonacute lacunar infarction of the posterior limb of
the right internal capsule.
2. No fracture or static subluxation of the cervical spine.
3. Moderate to severe disc degenerative disease of the lower
cervical spine.

## 2021-09-02 IMAGING — CT CT CERVICAL SPINE W/O CM
2 series · 11 of 27 positions shown, 14 images · non-contrast
Comparison: None.

CLINICAL DATA: Multiple recent falls



[Series 3: c spine soft · axial · 0.36mm/px · z∈[-199,-79]mm · 6 of 77 slices shown, 8 images]
[im 12/77  soft-tissue]
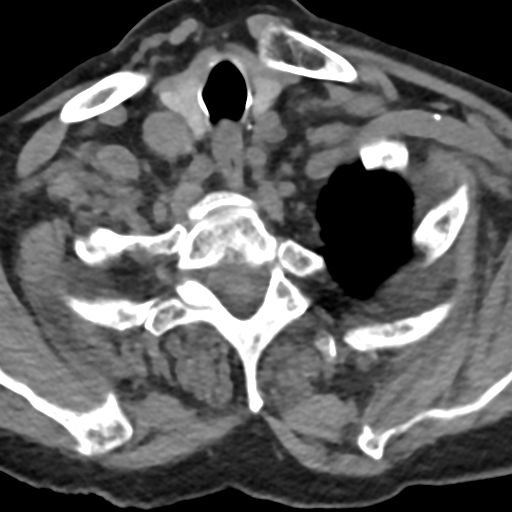
[im 12/77  bone]
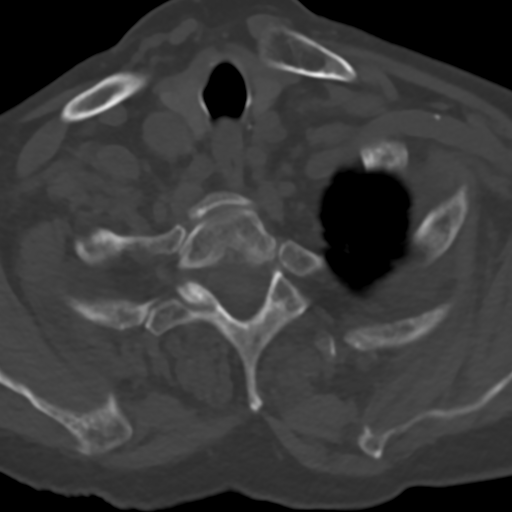
[im 24/77  bone]
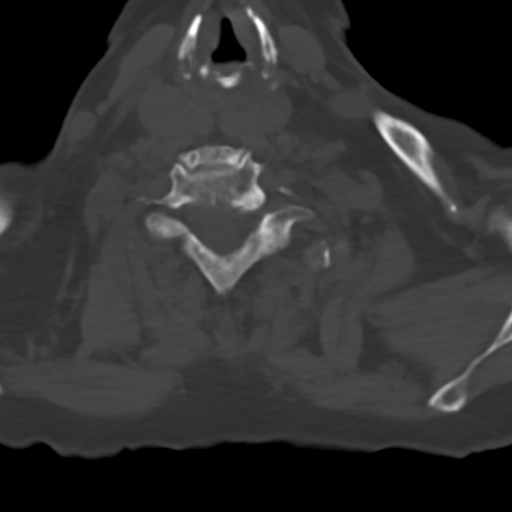
[im 36/77  bone]
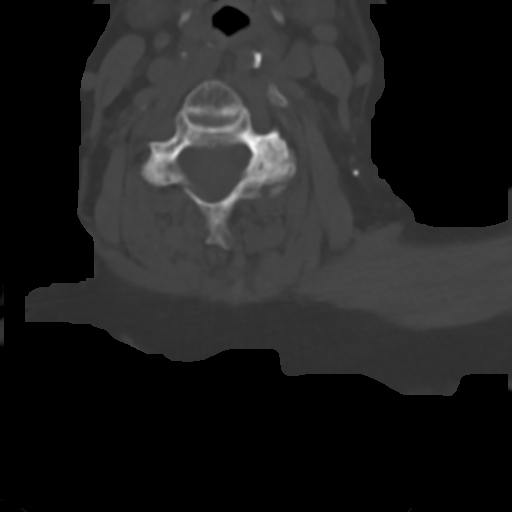
[im 47/77  bone]
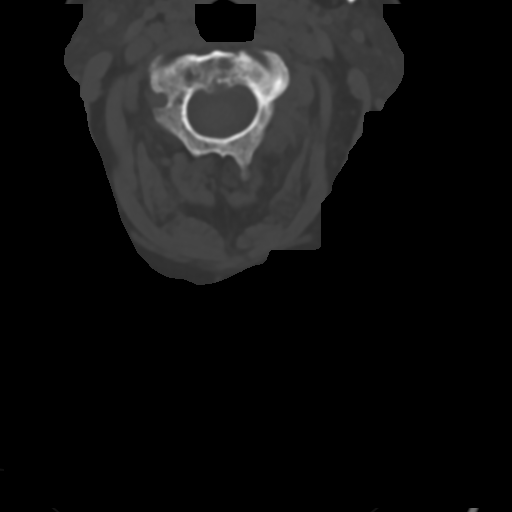
[im 59/77  soft-tissue]
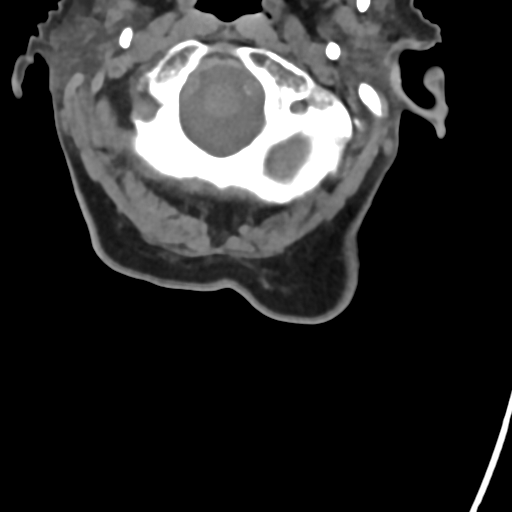
[im 59/77  bone]
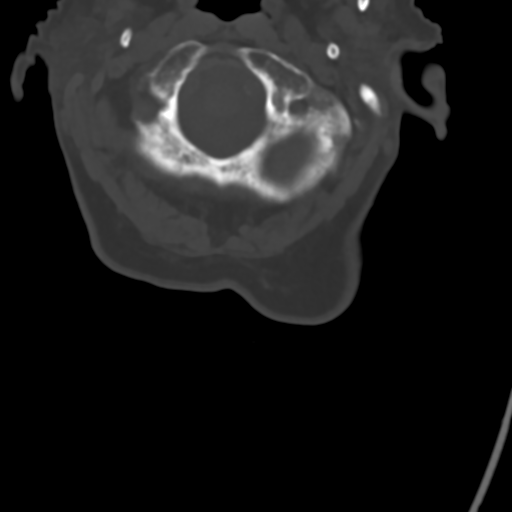
[im 71/77  bone]
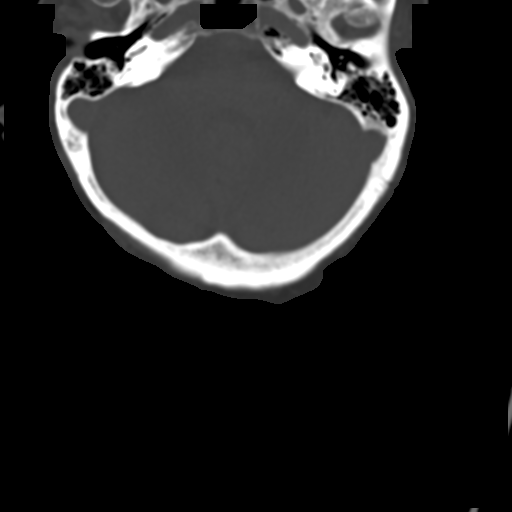

[Series 5: sag bone · sagittal · 0.22mm/px · 5 of 48 slices shown, 6 images]
[im 16/48  bone]
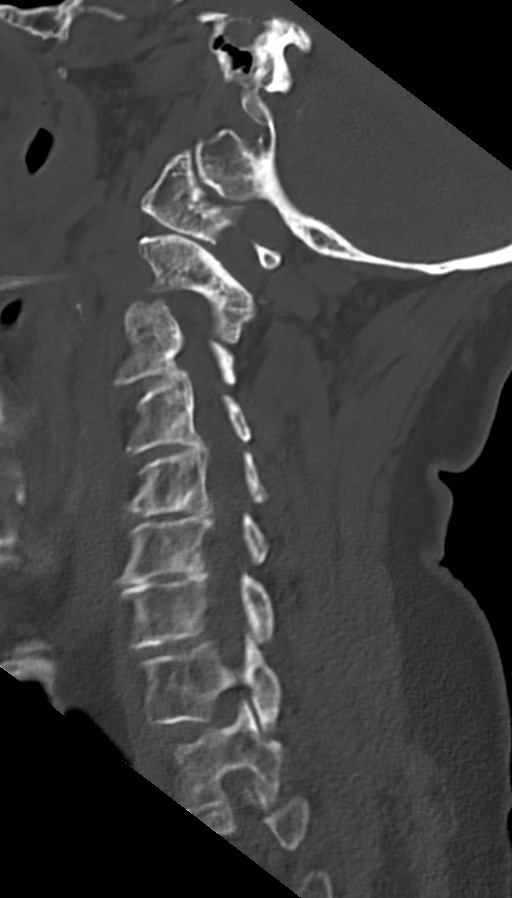
[im 20/48  bone]
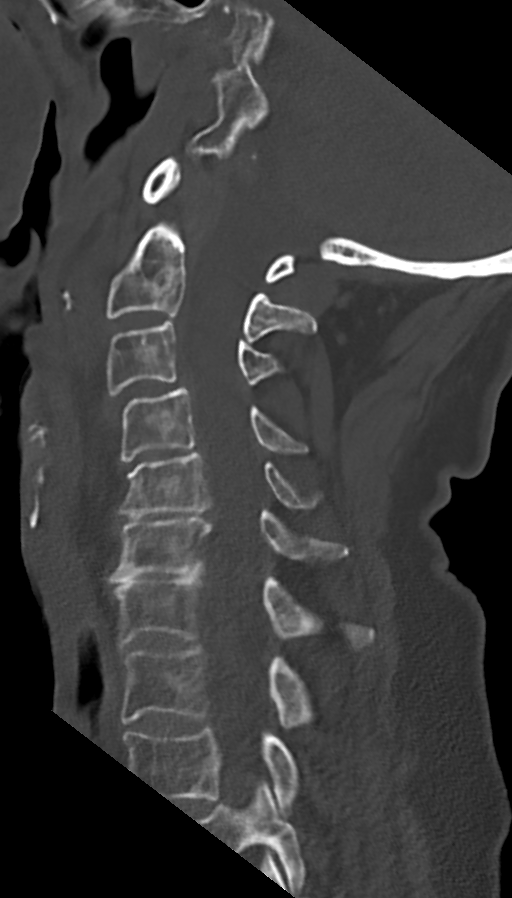
[im 24/48  soft-tissue]
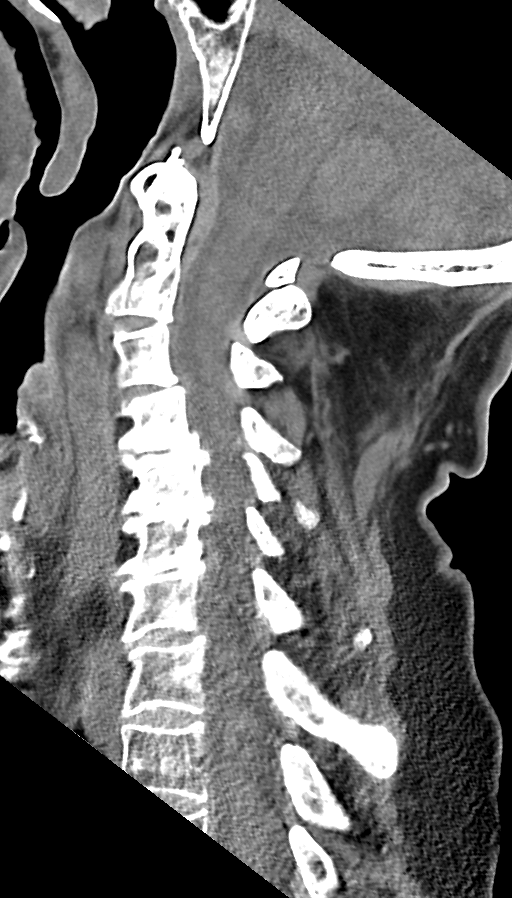
[im 24/48  bone]
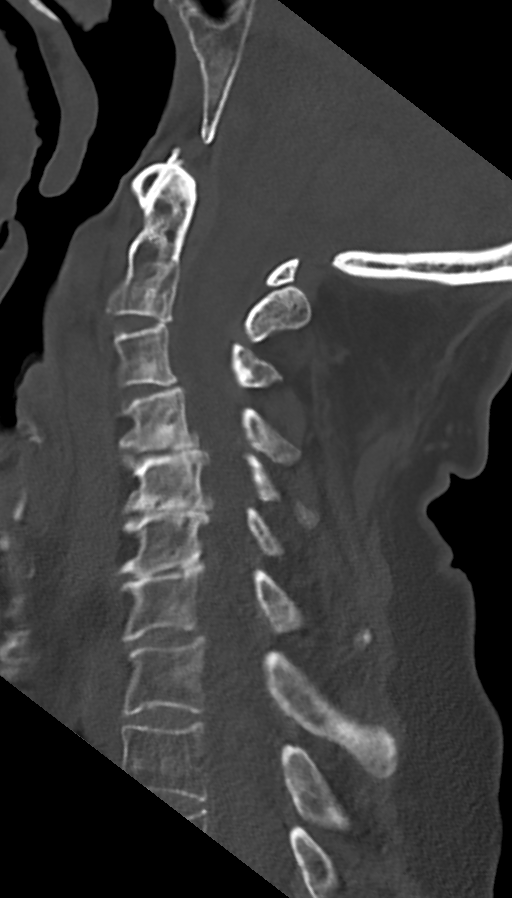
[im 28/48  bone]
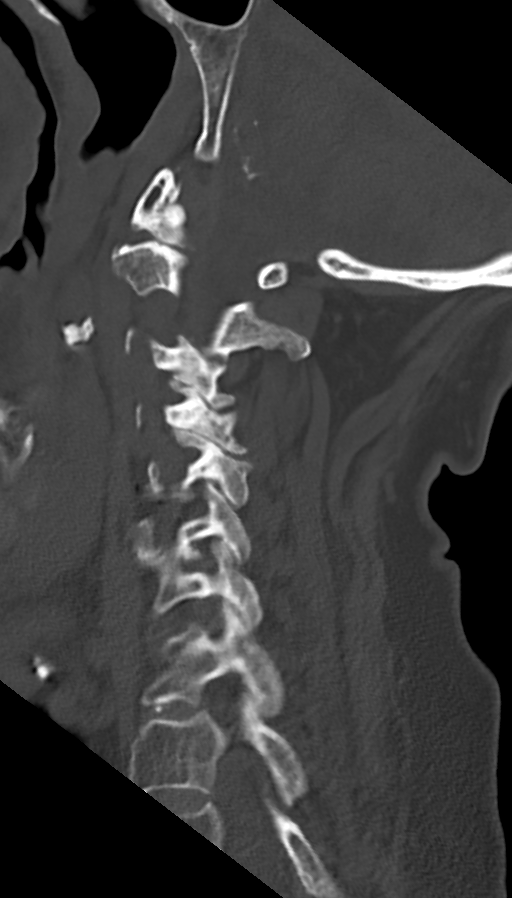
[im 32/48  bone]
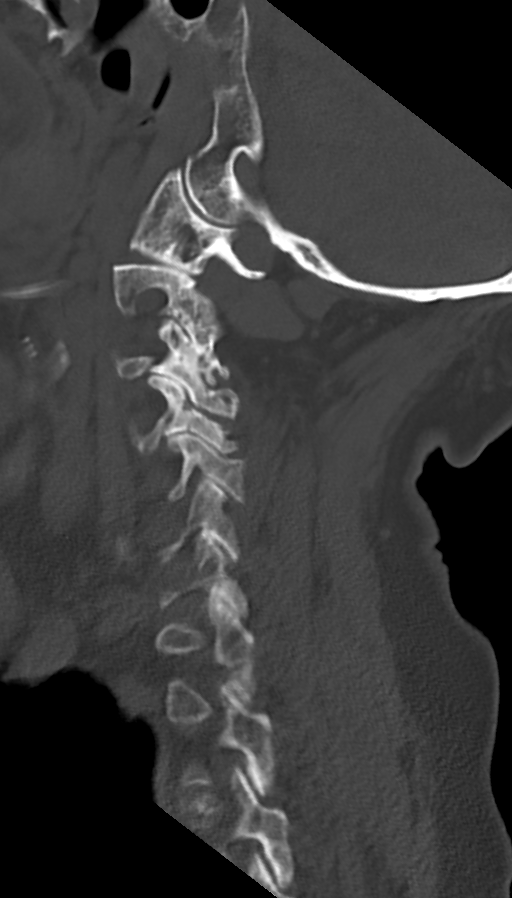

[11 of 27 positions shown; findings below may reference images not displayed]

FINDINGS: CT HEAD FINDINGS

Brain: No evidence of acute infarction, hemorrhage, hydrocephalus,
extra-axial collection or mass lesion/mass effect. Periventricular
and deep white matter hypodensity. Small nonacute lacunar infarction
of the posterior limb of the right internal capsule (series 3, image
15).

Vascular: No hyperdense vessel or unexpected calcification.

Skull: Normal. Negative for fracture or focal lesion.

Sinuses/Orbits: No acute finding.

Other: None.

CT CERVICAL SPINE FINDINGS

Alignment: Degenerative and positional straightening of the normal
cervical lordosis.

Skull base and vertebrae: No acute fracture. No primary bone lesion
or focal pathologic process.

Soft tissues and spinal canal: No prevertebral fluid or swelling. No
visible canal hematoma.

Disc levels: Moderate to severe disc space height loss and
osteophytosis of the lower cervical spine, worst from C4 through C7.

Upper chest: Negative.

Other: None.
IMPRESSION: 1. No acute intracranial pathology. Small-vessel white matter
disease. Small nonacute lacunar infarction of the posterior limb of
the right internal capsule.
2. No fracture or static subluxation of the cervical spine.
3. Moderate to severe disc degenerative disease of the lower
cervical spine.

## 2021-09-02 MED ORDER — LACTATED RINGERS IV BOLUS
1000.0000 mL | Freq: Once | INTRAVENOUS | Status: AC
Start: 2021-09-02 — End: 2021-09-02
  Administered 2021-09-02: 1000 mL via INTRAVENOUS

## 2021-09-02 MED ORDER — LIDOCAINE 5 % EX PTCH
1.0000 | MEDICATED_PATCH | CUTANEOUS | Status: DC
  Administered 2021-09-03 – 2021-09-05 (×3): 1 via TRANSDERMAL
  Filled 2021-09-02 (×4): qty 1

## 2021-09-02 MED ORDER — ASPIRIN EC 81 MG PO TBEC
81.0000 mg | DELAYED_RELEASE_TABLET | Freq: Every day | ORAL | Status: DC
  Administered 2021-09-02 – 2021-09-06 (×5): 81 mg via ORAL
  Filled 2021-09-02 (×5): qty 1

## 2021-09-02 MED ORDER — ALBUTEROL SULFATE (2.5 MG/3ML) 0.083% IN NEBU: 3.0000 mL | INHALATION_SOLUTION | Freq: Four times a day (QID) | RESPIRATORY_TRACT | Status: DC | PRN

## 2021-09-02 MED ORDER — ACETAMINOPHEN 500 MG PO TABS
1000.0000 mg | ORAL_TABLET | Freq: Three times a day (TID) | ORAL | Status: DC | PRN
  Administered 2021-09-03: 1000 mg via ORAL
  Filled 2021-09-02: qty 2

## 2021-09-02 MED ORDER — FERROUS SULFATE 325 (65 FE) MG PO TABS
325.0000 mg | ORAL_TABLET | Freq: Every day | ORAL | Status: DC
  Administered 2021-09-03 – 2021-09-06 (×4): 325 mg via ORAL
  Filled 2021-09-02 (×4): qty 1

## 2021-09-02 MED ORDER — IOHEXOL 300 MG/ML  SOLN
100.0000 mL | Freq: Once | INTRAMUSCULAR | Status: AC | PRN
  Administered 2021-09-02: 100 mL via INTRAVENOUS

## 2021-09-02 MED ORDER — ACETAMINOPHEN 500 MG PO TABS
1000.0000 mg | ORAL_TABLET | Freq: Once | ORAL | Status: AC
  Administered 2021-09-02: 1000 mg via ORAL
  Filled 2021-09-02: qty 2

## 2021-09-02 MED ORDER — FUROSEMIDE 20 MG PO TABS
20.0000 mg | ORAL_TABLET | Freq: Every day | ORAL | Status: DC
  Administered 2021-09-03 – 2021-09-06 (×4): 20 mg via ORAL
  Filled 2021-09-02 (×4): qty 1

## 2021-09-02 MED ORDER — ALPRAZOLAM 0.25 MG PO TABS
0.2500 mg | ORAL_TABLET | Freq: Every day | ORAL | Status: DC | PRN
  Administered 2021-09-04 – 2021-09-06 (×3): 0.25 mg via ORAL
  Filled 2021-09-02 (×4): qty 1

## 2021-09-02 MED ORDER — LACTATED RINGERS IV BOLUS
1000.0000 mL | Freq: Once | INTRAVENOUS | Status: AC
  Administered 2021-09-02: 1000 mL via INTRAVENOUS

## 2021-09-02 MED ORDER — LOSARTAN POTASSIUM 25 MG PO TABS: 12.5000 mg | ORAL_TABLET | Freq: Every day | ORAL | Status: DC

## 2021-09-02 MED ORDER — ATORVASTATIN CALCIUM 20 MG PO TABS
20.0000 mg | ORAL_TABLET | ORAL | Status: DC
Start: 2021-09-05 — End: 2021-09-06
  Administered 2021-09-05: 20 mg via ORAL
  Filled 2021-09-02: qty 1

## 2021-09-02 MED ORDER — CARVEDILOL 3.125 MG PO TABS
6.2500 mg | ORAL_TABLET | Freq: Two times a day (BID) | ORAL | Status: DC
  Administered 2021-09-03 – 2021-09-06 (×7): 6.25 mg via ORAL
  Filled 2021-09-02 (×8): qty 2

## 2021-09-02 MED ORDER — MAGNESIUM OXIDE -MG SUPPLEMENT 400 (240 MG) MG PO TABS
400.0000 mg | ORAL_TABLET | Freq: Every day | ORAL | Status: DC
  Administered 2021-09-03 – 2021-09-06 (×4): 400 mg via ORAL
  Filled 2021-09-02 (×4): qty 1

## 2021-09-02 MED ORDER — ENSURE ENLIVE PO LIQD
237.0000 mL | Freq: Two times a day (BID) | ORAL | Status: DC
  Administered 2021-09-03 – 2021-09-04 (×2): 237 mL via ORAL

## 2021-09-02 NOTE — Assessment & Plan Note (Addendum)
Hemoglobin stable at 10.5 ?--cont iron supplement ?

## 2021-09-02 NOTE — ED Provider Notes (Signed)
? ?[facility] ?Provider Note ? ? ? Event Date/Time  ? First MD Initiated Contact with Patient 09/02/21 1641   ?  (approximate) ? ? ?History  ? ?Fall, Weakness, and Constipation ? ? ?HPI ? ?Marissa Fletcher is a 81 y.o. 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  She does not think she hit her head.  She does have some pain in her back and is unsure if this is worse than usual.  She has been feeling very constipated and is not sure when the last time she had a bowel movement was.  She denies any urinary symptoms, chest pain, change in chronic cough, shortness of breath, headache or earache, sore throat or any new weakness other than chronic left hemibody weakness from prior stroke.  She has had very little appetite. ? ?  ? ? ?Physical Exam  ?Triage Vital Signs: ?ED Triage Vitals  ?Enc Vitals Group  ?   BP 09/02/21 1517 (!) 101/56  ?   Pulse Rate 09/02/21 1517 88  ?   Resp 09/02/21 1517 18  ?   Temp 09/02/21 1517 98 ?F (36.7 ?C)  ?   Temp src --   ?   SpO2 09/02/21 1517 94 %  ?   Weight 09/02/21 1510 124 lb (56.2 kg)  ?   Height 09/02/21 1510 5\' 4"  (1.626 m)  ?   Head Circumference --   ?   Peak Flow --   ?   Pain Score 09/02/21 1510 6  ?   Pain Loc --   ?   Pain Edu? --   ?   Excl. in [place]? --   ? ? ?Most recent vital signs: ?Vitals:  ? 09/02/21 1517 09/02/21 1919  ?BP: (!) 101/56 125/66  ?Pulse: 88 88  ?Resp: 18 20  ?Temp: 98 ?F (36.7 ?C) 98 ?F (36.7 ?C)  ?SpO2: 94% 93%  ? ? ?General: Awake, chronically unwell appearing. ?CV:  Prolonged capillary refill in the  digits.  2+ radial pulses. ?Resp:  Normal effort.  Clear bilaterally ?Abd:  No distended and mildly tender throughout with no rigidity or guarding. ?Other:  Patient is weak in her left arm and left leg compared to the right.  She states is baseline.  Cranial nerves appear intact.  PERRLA.  EOMI.  No C-spine tenderness but there is some tenderness over the lower T-spine and L-spine as well as over the left inferior ribs. ? ? ?ED Results / Procedures / Treatments  ?Labs ?(all labs ordered are listed, but only abnormal results are displayed) ?Labs Reviewed  ?BASIC METABOLIC PANEL - Abnormal; Notable for the following components:  ?    Result Value  ? Potassium 3.4 (*)   ? Glucose, Bld 111 (*)   ? All other components within normal limits  ?CBC - Abnormal; Notable for the following components:  ? Hemoglobin 10.5 (*)   ? HCT 35.3 (*)   ? MCH 24.5 (*)   ? MCHC 29.7 (*)   ? RDW 21.8 (*)   ? Platelets 523 (*)   ?  All other components within normal limits  ?URINALYSIS, ROUTINE W REFLEX MICROSCOPIC - Abnormal; Notable for the following components:  ? Color, Urine STRAW (*)   ? APPearance CLEAR (*)   ? All other components within normal limits  ?HEPATIC FUNCTION PANEL - Abnormal; Notable for the following components:  ? Albumin 3.1 (*)   ? Alkaline Phosphatase 142 (*)   ? All other components within normal limits  ?RESP PANEL BY RT-PCR (FLU A&B, COVID) ARPGX2  ?TSH  ?MAGNESIUM  ?TROPONIN I (HIGH SENSITIVITY)  ? ? ? ?EKG ? ?EKG is remarkable sinus rhythm with first-degree block with a PR interval of 230, normal axis and some nonspecific changes artifact in V2 without any other clear evidence of acute ischemia or significant arrhythmia.  ? ? ?RADIOLOGY ? ?CT head and C-spine my review shows no evidence of a skull fracture, acute intracranial hemorrhage, acute ischemia or acute C-spine injury.  There is evidence of what appears to be an old stroke.  Also reviewed radiology interpretation and agree the findings of a small nonacute  lacunar infarct in the posterior limb of the right internal capsule and some degenerative changes of the lower C-spine. ? ?CT abdomen pelvis on my interpretation without evidence of an SBO, hematoma, pancreatitis, appendicitis ? ?Stranding or other clear acute process.  I reviewed radiology capitation and agree with the findings of fairly significant stool burden as well as a descending stable thoracic aortic aneurysm, stable compression fractures at T8, T9 and T11 as well as subacute healing left posterior 10th through 12th rib fractures and some left gluteal subcutaneous fat concerning consistent with ecchymosis and aortic atherosclerosis.  There is also notation of extensive mural thrombus throughout the visualized thoracoabdominal aorta. ? ?CT T and L-spine on my interpretation shows multiple compression fractures and I reviewed radiologist rotation and agree to findings of a new which deformity at T12 with prior deformities noted at T6, T7, T9 and T10.  There is also evidence of previously noted rib fractures and severe aortic atherosclerosis as well as ulceration of the infrarenal abdominal aortic aneurysm. ?PROCEDURES: ? ?Critical Care performed: No ? ?.1-3 Lead EKG Interpretation ?Performed by: Lucrezia Starch, MD ?Authorized by: Lucrezia Starch, MD  ? ?  Interpretation: normal   ?  ECG rate assessment: normal   ?  Rhythm: sinus rhythm   ?  Ectopy: none   ?  Conduction: abnormal   ?  Abnormal conduction: 1st degree AV block   ? ?The patient is on the cardiac monitor to evaluate for evidence of arrhythmia and/or significant heart rate changes. ? ? ?MEDICATIONS ORDERED IN ED: ?Medications  ?lidocaine (LIDODERM) 5 % 1 patch (1 patch Transdermal Patient Refused/Not Given 09/02/21 1928)  ?lactated ringers bolus 1,000 mL (0 mLs Intravenous Stopped 09/02/21 1858)  ?iohexol (OMNIPAQUE) 300 MG/ML solution 100 mL (100 mLs Intravenous Contrast Given 09/02/21 1756)  ?lactated ringers bolus 1,000 mL (1,000 mLs Intravenous  New Bag/Given 09/02/21 1927)  ?acetaminophen (TYLENOL) tablet 1,000 mg (1,000 mg Oral Given 09/02/21 1920)  ? ? ? ?IMPRESSION / MDM / ASSESSMENT AND PLAN / ED COURSE  ?I reviewed the triage vital signs and the nursing notes. ?             ?               ? ?Differential diagnosis includes, but is not limited to fall secondary to syncope versus mechanical possibility arrhythmia, ACS, hematoma, anemia, [place] derangements, acute infectious process.  No new  deficits from known chronic deficits to suggest a CVA at this time.  Additional considerations include dehydration polypharmacy. ? ?EKG is remarkable sinus rhythm with first-degree block with a PR interval of 230, normal axis and some nonspecific changes artifact in V2 without any other clear evidence of acute ischemia or significant arrhythmia.  ? ?CT head and C-spine my review shows no evidence of a skull fracture, acute intracranial hemorrhage, acute ischemia or acute C-spine injury.  There is evidence of what appears to be an old stroke.  Also reviewed radiology interpretation and agree the findings of a small nonacute lacunar infarct in the posterior limb of the right internal capsule and some degenerative changes of the lower C-spine. ? ?CT abdomen pelvis on my interpretation without evidence of an SBO, hematoma, pancreatitis, appendicitis ? ?Stranding or other clear acute process.  I reviewed radiology capitation and agree with the findings of fairly significant stool burden as well as a descending stable thoracic aortic aneurysm, stable compression fractures at T8, T9 and T11 as well as subacute healing left posterior 10th through 12th rib fractures and some left gluteal subcutaneous fat concerning consistent with ecchymosis and aortic atherosclerosis.  There is also notation of extensive mural thrombus throughout the visualized thoracoabdominal aorta. ? ?CT T and L-spine on my interpretation shows multiple compression fractures and I reviewed radiologist  rotation and agree to findings of a new which deformity at T12 with prior deformities noted at T6, T7, T9 and T10.  There is also evidence of previously noted rib fractures and severe aortic atheroscler

## 2021-09-02 NOTE — Assessment & Plan Note (Addendum)
No acute issues suspected at this time ?CT showed stable 4 cm descending thoracic aortic aneurysm and 3.3 cm ?infrarenal abdominal aortic aneurysm. Recommend follow-up every 3 ?years. ? ?

## 2021-09-02 NOTE — Assessment & Plan Note (Addendum)
No complaints of chest pain ?Continue aspirin and atorvastatin ?

## 2021-09-02 NOTE — Assessment & Plan Note (Addendum)
Increase nursing assistance required due to left-sided weakness ?

## 2021-09-02 NOTE — Assessment & Plan Note (Addendum)
Well controlled.  BG within inpatient goal. ?--d/c'ed BG checks and SSI ?

## 2021-09-02 NOTE — Assessment & Plan Note (Signed)
Secondary to the above ?Plan as above ?

## 2021-09-02 NOTE — ED Triage Notes (Addendum)
Patient arrived by EMS from home for weakness and constipation X2 weeks. Reports falling on Tuesday and now experiencing right side back pain. Patient had fall in March and has broken left sided ribs. Placed on pain medication. HX stroke in December 2022 and left with left sided deficits ?

## 2021-09-02 NOTE — Assessment & Plan Note (Addendum)
Not currently acutely exacerbated ?

## 2021-09-02 NOTE — Assessment & Plan Note (Addendum)
Losartan held on admission ?Continue carvedilol and lasix ?

## 2021-09-02 NOTE — Assessment & Plan Note (Addendum)
CT abdomen showed moderate fecal retention throughout the colon consistent with constipation ?--pt reported constipation resolved ? ?

## 2021-09-02 NOTE — H&P (Signed)
?History and Physical  ? ? ?Patient: Marissa Fletcher TKP:[id number] DOB: [birth year] ?DOA: 09/02/2021 ?DOS: the patient was seen and examined on 09/02/2021 ?PCP: Juluis Pitch, MD  ?Patient coming from: Home ? ?Chief Complaint:  ?Chief Complaint  ?Patient presents with  ? Fall  ? Weakness  ? Constipation  ? ? ?HPI: Marissa Fletcher is a 81 y.o. 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  Patient reports that since discharge from SNF she has been progressively weaker thus leading to the fall.  She was ambulant with an assistive device while in rehab.  She denies hitting her head or loss of consciousness. ?ED course and data review: On arrival blood pressure was soft at 101/56 with otherwise normal vitals.  Blood work revealed stable hemoglobin of 10.5, mild hypokalemia of 3.4 but CBC and BMP were for the most part unremarkable.  Troponin 7, TSH 0.6, magnesium 1.4.  LFTs with albumin 3.1 and alk phos of 142 but otherwise unremarkable.  COVID and flu negative.  Urinalysis sterile.  EKG, personally viewed and interpreted shows sinus rhythm at 87 with nonspecific ST-T wave changes.  Patient had extensive trauma imaging to include CT head, cervical, thoracic and L-spine as well as CT abdomen pelvis and the results were notable for stool burden consistent with constipation as well as new wedge deformity of the T12 vertebral body, with approximately ?40% anterior height loss.(Please refer to detailed imaging reports for details).   ?Also noted to have multiple old rib fractures on the left as well as multiple other  chronic stable findings. ?The emergency room provider contacted on-call neurosurgeon who will see patient in the a.m.  Hospitalist consulted for admission. ?  ?Review of Systems  ?Unable to perform ROS: Other sleeping from pain meds ? ? ?Past Medical History:  ?Diagnosis Date  ? Anxiety   ? Aortic atherosclerosis ([place])   ? Arthritis   ? Cardiomyopathy ([place])   ? a. 04/2021 Echo: EF 35-40%, glob HK. GrII DD.  Nl RV size/fxn. Mod dil LA. Mild MR.  ? Carotid arterial disease ([place])   ? a. 04/2021 CTA Head/neck: RICA 60, LICA 55.  ? COPD (chronic obstructive pulmonary disease) ([place])   ? Depression   ? Diabetes mellitus without complication ([place])   ? History of kidney stones   ? Hypertension   ? Lung cancer ([place])   ? Stroke (cerebrum) ([place])   ? a. 04/2021 MRI brain: Acute infarct post limb of R internal capsule.  ? Thoracic aortic aneurysm ([place])   ? Tobacco abuse   ? ?Past Surgical History:  ?Procedure Laterality Date  ? ABDOMINAL HYSTERECTOMY    ? CHOLECYSTECTOMY    ? EYE SURGERY Bilateral   ? KYPHOPLASTY N/A 07/30/2020  ? Procedure: T10 Kyphoplasty;  Surgeon: Hessie Knows, MD;  Location: [facility] ORS;  Service: Orthopedics;  Laterality: N/A;  T10  ? KYPHOPLASTY N/A 08/19/2020  ? Procedure: T9 YPHOPLASTY;  Surgeon: Hessie Knows, MD;  Location: [facility] ORS;  Service: Orthopedics;  Laterality: N/A;  ? ?Social History:  reports that she has been smoking cigarettes. She has a 34.00 pack-year smoking  history. She has never used smokeless tobacco. She reports that she does not drink alcohol and does not use drugs. ? ?Allergies  ?Allergen Reactions  ? Sulfa Antibiotics Swelling  ? ? ?Family History  ?Problem Relation Age of Onset  ? Dementia Mother   ? Congestive Heart Failure Mother   ? Bladder Cancer Father   ? Heart disease Father   ? Breast cancer Neg Hx   ? ? ?Prior to Admission medications   ?Medication Sig Start Date End Date Taking? Authorizing Provider  ?acetaminophen (TYLENOL) 500 MG tablet Take 2 tablets (1,000 mg total) by  mouth 3 (three) times daily as needed for mild pain, moderate pain or headache. 05/20/21   Nita Sells, MD  ?albuterol (VENTOLIN HFA) 108 (90 Base) MCG/ACT inhaler Inhale 2 puffs into the lungs every 6 (six) hours as needed for wheezing or shortness of breath. 08/18/21   Loletha Grayer, MD  ?ALPRAZolam Duanne Moron) 0.25 MG tablet Take 1 tablet (0.25 mg total) by mouth daily as needed for anxiety. 08/18/21   Loletha Grayer, MD  ?aspirin EC 81 MG EC tablet Take 1 tablet (81 mg total) by mouth daily. Swallow whole. 05/21/21   Nita Sells, MD  ?atorvastatin (LIPITOR) 20 MG tablet Take 20 mg by mouth every Monday, Wednesday, and Friday.    [provider]  ?carvedilol (COREG) 6.25 MG tablet Take 1 tablet (6.25 mg total) by mouth 2 (two) times daily with a meal. 07/13/21 08/14/21  End, Harrell Gave, MD  ?clopidogrel (PLAVIX) 75 MG tablet Take 1 tablet (75 mg total) by mouth daily. 05/21/21   Nita Sells, MD  ?doxepin (SINEQUAN) 25 MG capsule Take 50-75 mg by mouth at bedtime. 04/01/20   [provider]  ?feeding supplement (ENSURE ENLIVE / ENSURE PLUS) LIQD Take 237 mLs by mouth 2 (two) times daily between meals. 08/18/21   Loletha Grayer, MD  ?ferrous sulfate 325 (65 FE) MG tablet Take 1 tablet (325 mg total) by mouth daily. 08/18/21 09/17/21  Loletha Grayer, MD  ?furosemide (LASIX) 20 MG tablet Take 1 tablet (20 mg total) by mouth daily. 08/18/21   Loletha Grayer, MD  ?gabapentin (NEURONTIN) 300 MG capsule Take 300 mg by mouth at bedtime. 02/18/20   [provider]  ?lidocaine (LIDODERM) 5 % Place 1 patch onto the skin daily. Remove & Discard patch within 12 hours or as directed by MD to areas of pain 08/18/21   Loletha Grayer, MD  ?losartan (COZAAR) 25 MG tablet Take 0.5 tablets (12.5 mg total) by mouth daily. 05/20/21   Theora Gianotti, NP  ?magnesium oxide (MAG-OX) 400 (240 Mg) MG tablet Take 1 tablet (400 mg total) by mouth daily. 08/18/21   Loletha Grayer, MD  ?metFORMIN (GLUCOPHAGE-XR) 500 MG 24 hr tablet Take 500 mg by mouth 2 (two) times daily. 03/08/20   [provider]  ?mupirocin ointment (BACTROBAN) 2 % Clean feet with soap and water, rinse and pat dry.  Apply Bactroban on any wounds on the toes and then wrapped toes with 1 inch Kerlix weeding between the toes twice a day. 08/18/21   Loletha Grayer, MD  ?nicotine (NICODERM CQ - DOSED IN MG/24 HOURS) 21 mg/24hr patch Place 1 patch (21 mg total) onto the skin daily. 08/18/21   Loletha Grayer, MD  ?PARoxetine (PAXIL) 30 MG tablet Take 30 mg by mouth every morning. 03/08/20   [provider]  ?polyethylene glycol (MIRALAX / GLYCOLAX) 17 g packet Take 17 g by mouth daily as needed for moderate  constipation. 05/12/20   Wieting, Richard, MD  ? ? ?Physical Exam: ?Vitals:  ? 09/02/21 1510 09/02/21 1517 09/02/21 1919  ?BP:  (!) 101/56 125/66  ?Pulse:  88 88  ?Resp:  18 20  ?Temp:  98 ?F (36.7 ?C) 98 ?F (36.7 ?C)  ?TempSrc:   Oral  ?SpO2:  94% 93%  ?Weight: 56.2 kg    ?Height: 5' 4" (1.626 m)    ? ?Physical Exam ?Vitals and nursing note reviewed.  ?Constitutional:   ?   General: She is sleeping. She is not in acute distress. ?   Comments: Sleeping and difficult to arouse likely related to pain medication administered in ED  ?HENT:  ?   Head: Normocephalic and atraumatic.  ?Cardiovascular:  ?   Rate and Rhythm: Normal rate and regular rhythm.  ?   Heart sounds: Normal heart sounds.  ?Pulmonary:  ?   Effort: Pulmonary effort is normal.  ?   Breath sounds: Normal breath sounds.  ?Abdominal:  ?   Palpations: Abdomen is soft.  ?   Tenderness: There is no abdominal tenderness.  ?Neurological:  ?   Mental Status: She is lethargic.  ? ? ? ?Data Reviewed: ?Relevant notes from primary care and specialist visits, past discharge summaries as available in EHR, including Care Everywhere. ?Prior diagnostic testing as pertinent to current admission diagnoses ?Updated medications and problem lists for  reconciliation ?ED course, including vitals, labs, imaging, treatment and response to treatment ?Triage notes, nursing and pharmacy notes and ED provider's notes ?Notable results as noted in HPI ? ? ?Assessment and Pla

## 2021-09-02 NOTE — Assessment & Plan Note (Addendum)
Acute as seen on CT ?Neurosurgery consulted, no surgical intervention ?--MRI thoracic showed Small paravertebral hematoma. No epidural hematoma. ?Plan: ?--TLSO when out of bed ?

## 2021-09-02 NOTE — Assessment & Plan Note (Addendum)
Appears euvolemic ?--LVEF 35 to 40% with grade II DD. ?Continue carvedilol and furosemide ?

## 2021-09-02 NOTE — Assessment & Plan Note (Addendum)
Likely acute on chronic secondary to general physical deconditioning, recent acute illness and recent fall ?--PT/OT  ?

## 2021-09-03 ENCOUNTER — Inpatient Hospital Stay: Payer: Medicare Other

## 2021-09-03 DIAGNOSIS — S22080A Wedge compression fracture of T11-T12 vertebra, initial encounter for closed fracture: Secondary | ICD-10-CM | POA: Diagnosis not present

## 2021-09-03 LAB — CBC
HCT: 30 % — ABNORMAL LOW (ref 36.0–46.0)
Hemoglobin: 9.2 g/dL — ABNORMAL LOW (ref 12.0–15.0)
MCH: 24.7 pg — ABNORMAL LOW (ref 26.0–34.0)
MCHC: 30.7 g/dL (ref 30.0–36.0)
MCV: 80.4 fL (ref 80.0–100.0)
Platelets: 450 10*3/uL — ABNORMAL HIGH (ref 150–400)
RBC: 3.73 MIL/uL — ABNORMAL LOW (ref 3.87–5.11)
RDW: 21.7 % — ABNORMAL HIGH (ref 11.5–15.5)
WBC: 6 10*3/uL (ref 4.0–10.5)
nRBC: 0 % (ref 0.0–0.2)

## 2021-09-03 LAB — GLUCOSE, CAPILLARY
Glucose-Capillary: 97 mg/dL (ref 70–99)
Glucose-Capillary: 113 mg/dL — ABNORMAL HIGH (ref 70–99)
Glucose-Capillary: 94 mg/dL (ref 70–99)
Glucose-Capillary: 120 mg/dL — ABNORMAL HIGH (ref 70–99)

## 2021-09-03 LAB — CREATININE, SERUM
Creatinine, Ser: 0.64 mg/dL (ref 0.44–1.00)
GFR, Estimated: >60 mL/min (ref >60)

## 2021-09-03 IMAGING — MR MR THORACIC SPINE WO/W CM
6 of 10 series · 25 of 48 positions shown · IV contrast (gadavist)
Comparison: CT thoracic spine from yesterday. MRI thoracic spine
dated [DATE].

CLINICAL DATA: Thoracic compression fracture.

EXAM:
MRI THORACIC WITHOUT AND WITH CONTRAST
TECHNIQUE: Multiplanar and multiecho pulse sequences of the thoracic spine were
obtained without and with intravenous contrast.
CONTRAST:  5mL GADAVIST GADOBUTROL 1 MMOL/ML IV SOLN

[Series 18: T1 · sagittal · 6.0mm · 1.41mm/px · 2 of 9 slices shown (1 of 4)]
[im 1/9]
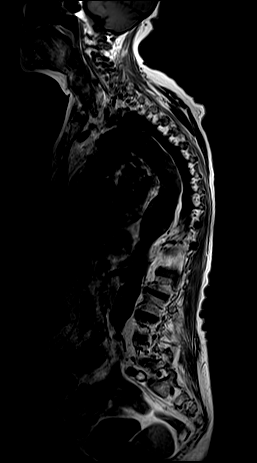
[im 9/9]
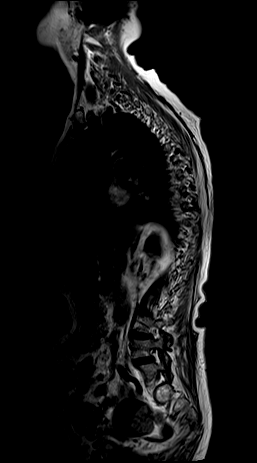

[Series 19: T2 · sagittal · 3.0mm · 1.06mm/px · 4 of 19 slices shown (1 of 2)]
[im 1/19]
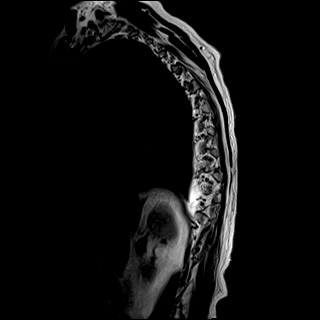
[im 7/19]
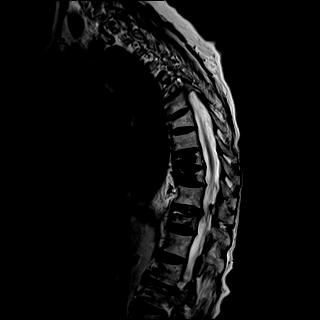
[im 13/19]
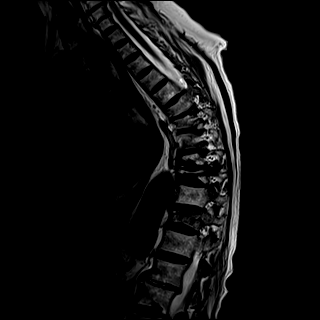
[im 19/19]
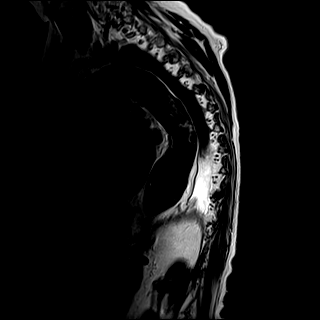

[Series 20: T1 · sagittal · 3.0mm · 1.06mm/px · 3 of 19 slices shown (2 of 4)]
[im 1/19]
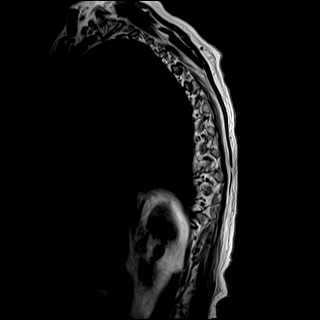
[im 10/19]
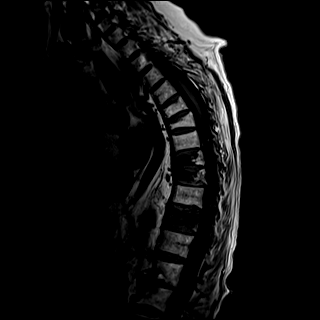
[im 19/19]
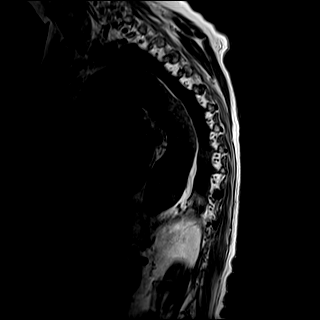

[Series 22: T2 · axial · 4.0mm · 0.59mm/px · z∈[-214,-25]mm · 7 of 44 slices shown (2 of 2)]
[im 1/44]
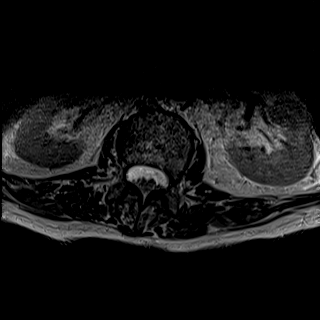
[im 8/44]
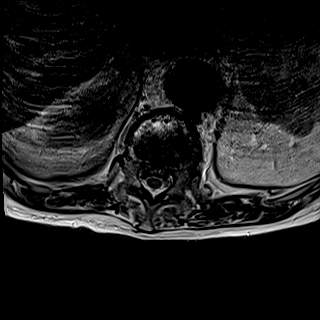
[im 15/44]
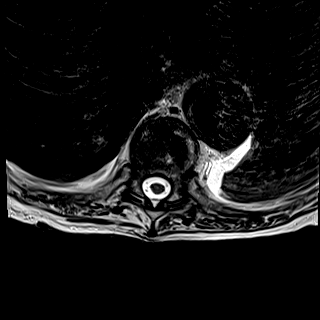
[im 22/44]
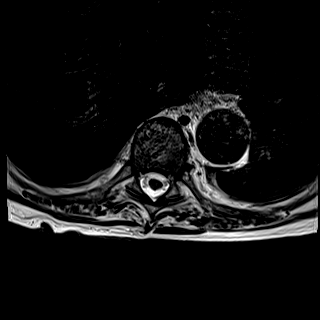
[im 29/44]
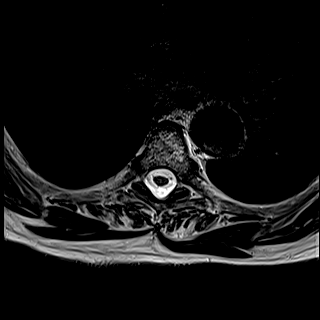
[im 36/44]
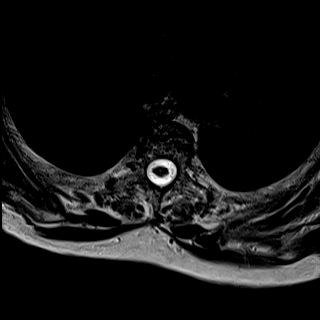
[im 44/44]
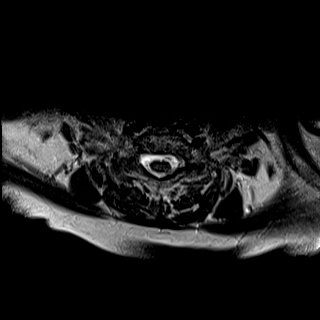

[Series 24: T1 · axial · non-contrast · 4.0mm · 0.31mm/px · z∈[-214,-25]mm · 5 of 30 slices shown (3 of 4)]
[im 1/30]
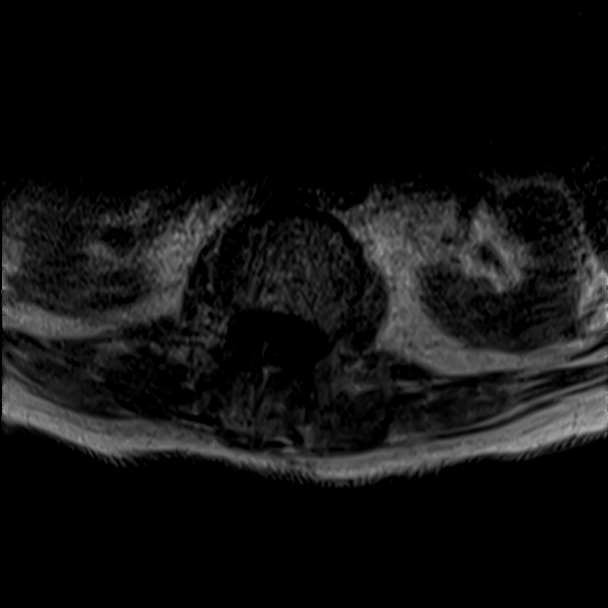
[im 8/30]
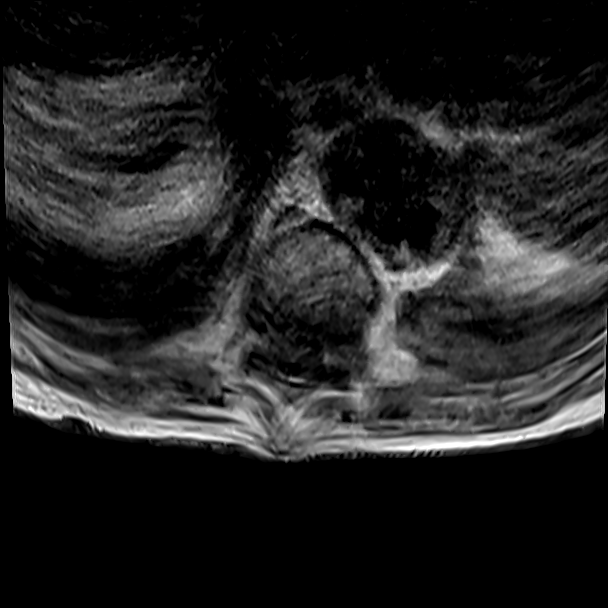
[im 15/30]
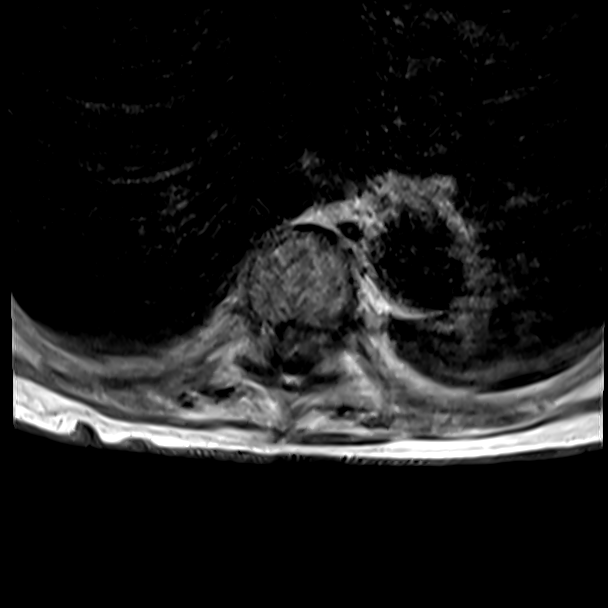
[im 22/30]
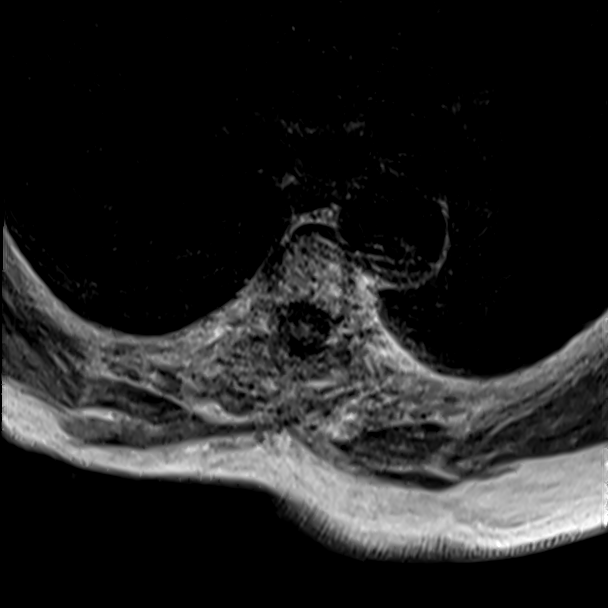
[im 30/30]
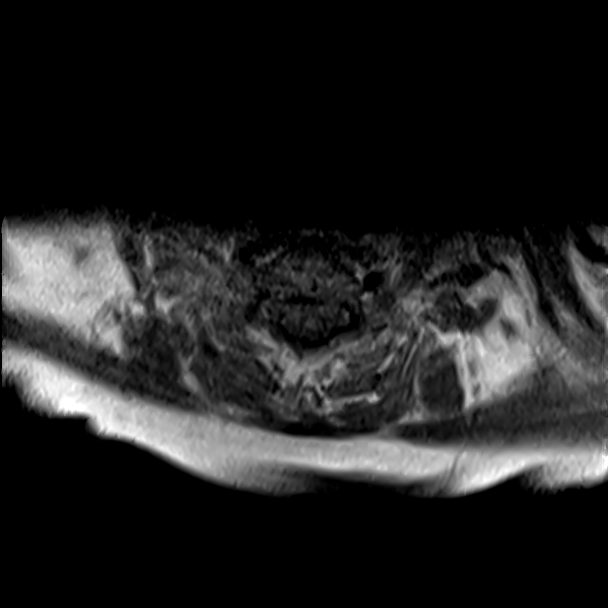

[Series 25: T1 · axial · non-contrast · 4.0mm · 0.31mm/px · z∈[-214,-116]mm · 4 of 44 slices shown (4 of 4)]
[im 1/44]
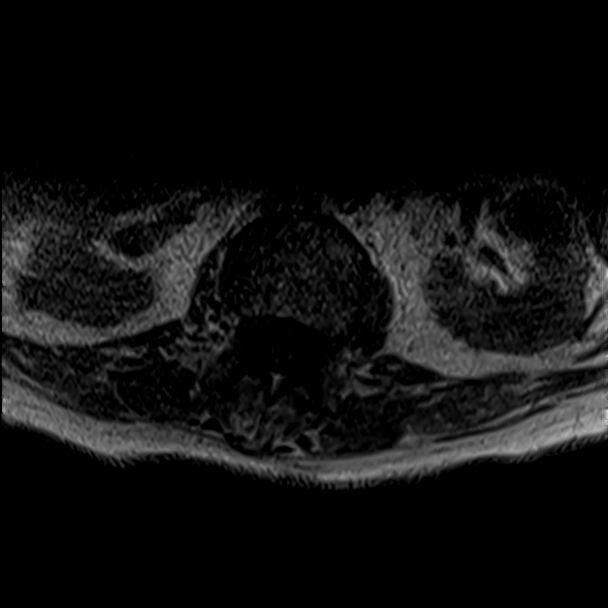
[im 8/44]
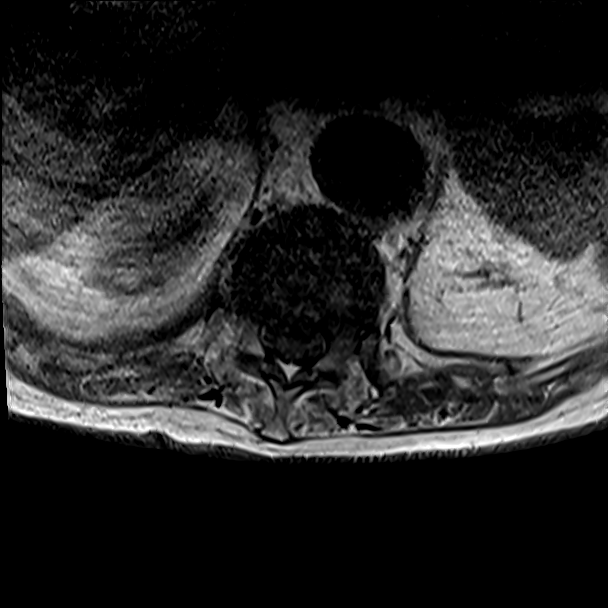
[im 15/44]
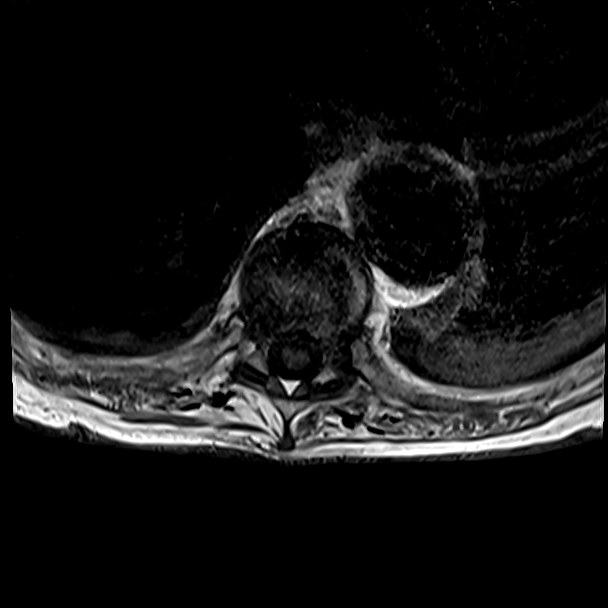
[im 22/44]
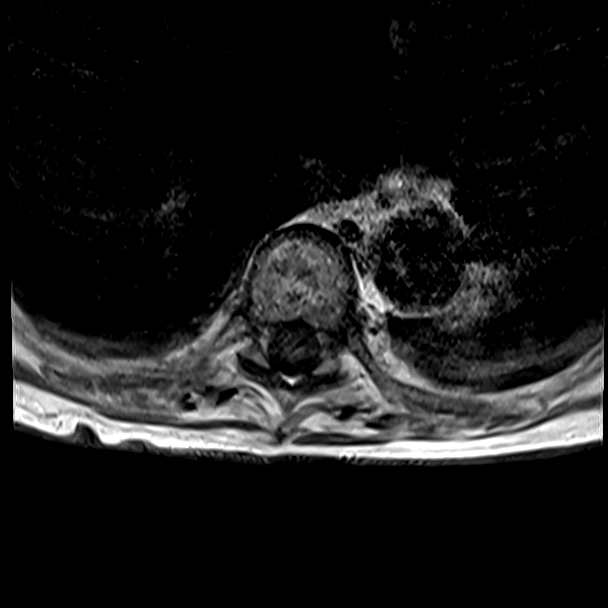

[25 of 48 positions shown; findings below may reference images not displayed]

FINDINGS: Alignment:  Mildly exaggerated thoracic kyphosis.  No listhesis.

Vertebrae: Acute T12 superior endplate compression fracture again
noted with 40% height loss. Diffuse marrow edema and enhancement
throughout the vertebral body. Minimal retropulsion of the
posterosuperior endplate. No epidural hematoma. Chronic T9 and T10
compression deformity status post cement augmentation. Chronic T6
and T7 compression deformities. No evidence of discitis or bone
lesion.

Cord:  Normal signal and morphology.  No intrathecal enhancement.

Paraspinal and other soft tissues: Small paravertebral hematoma at
T12. Descending thoracic aortic aneurysm better characterized on
recent CT. Trace bilateral pleural effusions with dependent
atelectasis in the left greater than right posterior lower lobes.

Disc levels:

Mild retropulsion and disc bulging at T10-T11 and T11-T12 effaces
the ventral thecal sac without spinal canal stenosis. No significant
neuroforaminal stenosis at any level.
IMPRESSION: 1. Acute T12 compression fracture with 40% height loss and minimal
retropulsion. Small paravertebral hematoma. No epidural hematoma.
2. Chronic T6, T7, T9, and T10 compression deformities.

## 2021-09-03 MED ORDER — ONDANSETRON HCL 4 MG/2ML IJ SOLN
4.0000 mg | Freq: Four times a day (QID) | INTRAMUSCULAR | Status: DC | PRN
Start: 2021-09-03 — End: 2021-09-06
  Administered 2021-09-03 – 2021-09-04 (×3): 4 mg via INTRAVENOUS
  Filled 2021-09-03 (×3): qty 2

## 2021-09-03 MED ORDER — ENOXAPARIN SODIUM 40 MG/0.4ML IJ SOSY
40.0000 mg | PREFILLED_SYRINGE | INTRAMUSCULAR | Status: DC
  Administered 2021-09-03 – 2021-09-06 (×4): 40 mg via SUBCUTANEOUS
  Filled 2021-09-03 (×4): qty 0.4

## 2021-09-03 MED ORDER — POLYETHYLENE GLYCOL 3350 17 G PO PACK: 34.0000 g | PACK | ORAL | Status: DC

## 2021-09-03 MED ORDER — GADOBUTROL 1 MMOL/ML IV SOLN
5.0000 mL | Freq: Once | INTRAVENOUS | Status: AC | PRN
  Administered 2021-09-03: 5 mL via INTRAVENOUS

## 2021-09-03 MED ORDER — ACETAMINOPHEN 325 MG PO TABS
650.0000 mg | ORAL_TABLET | Freq: Four times a day (QID) | ORAL | Status: DC | PRN
  Administered 2021-09-04 – 2021-09-06 (×2): 650 mg via ORAL
  Filled 2021-09-03 (×3): qty 2

## 2021-09-03 MED ORDER — ACETAMINOPHEN 650 MG RE SUPP: 650.0000 mg | Freq: Four times a day (QID) | RECTAL | Status: DC | PRN

## 2021-09-03 MED ORDER — INSULIN ASPART 100 UNIT/ML IJ SOLN: 0.0000 [IU] | Freq: Every day | INTRAMUSCULAR | Status: DC

## 2021-09-03 MED ORDER — ONDANSETRON HCL 4 MG PO TABS: 4.0000 mg | ORAL_TABLET | Freq: Four times a day (QID) | ORAL | Status: DC | PRN

## 2021-09-03 MED ORDER — HYDROCODONE-ACETAMINOPHEN 5-325 MG PO TABS
1.0000 | ORAL_TABLET | ORAL | Status: DC | PRN
  Administered 2021-09-03 – 2021-09-06 (×12): 1 via ORAL
  Filled 2021-09-03 (×12): qty 1

## 2021-09-03 MED ORDER — MORPHINE SULFATE (PF) 2 MG/ML IV SOLN: 2.0000 mg | INTRAVENOUS | Status: DC | PRN

## 2021-09-03 MED ORDER — INSULIN ASPART 100 UNIT/ML IJ SOLN: 0.0000 [IU] | Freq: Three times a day (TID) | INTRAMUSCULAR | Status: DC

## 2021-09-03 NOTE — Plan of Care (Signed)

## 2021-09-03 NOTE — Progress Notes (Signed)
Orthopedic Tech Progress Note ?Patient Details:  ?Marissa Fletcher ?[birth year] ?[id number] ? ?Order called into [facility] for TLSO @ 1555. ? ?Patient ID: Marissa Fletcher, female   DOB: 12/21/1940, 81 y.o.   MRN: [id number] ? ?Marissa Fletcher ?09/03/2021, 3:59 PM ? ?

## 2021-09-03 NOTE — Consult Note (Signed)
? ?Referring Physician:  ?No referring provider defined for this encounter. ? ?Primary Physician:  ?Juluis Pitch, MD ? ?Chief Complaint:  back pain, leg pain and weakness ? ?History of Present Illness: ?Marissa Fletcher is a 81 y.o. female who presents with the chief complaint of back pain, leg pain and weakness.  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  She fell at her house on Tuesday.  No loss of consciousness.  She has been able to urinate and defecate since the fall.   ? ? ?The symptoms are causing a significant impact on the patient's life.  ? ?Review of Systems:  ?A 10 point review of systems is negative, except for the pertinent positives and negatives detailed in the HPI. ? ?Past Medical History: ?Past Medical History:  ?Diagnosis Date  ? Anxiety   ? Aortic atherosclerosis ([place])   ? Arthritis   ? Cardiomyopathy ([place])   ? a. 04/2021 Echo: EF 35-40%, glob HK. GrII DD.  Nl RV size/fxn. Mod dil LA. Mild MR.  ? Carotid arterial disease ([place])   ? a. 04/2021 CTA Head/neck: RICA 60, LICA 55.  ? COPD (chronic obstructive pulmonary disease) ([place])   ? Depression   ? Diabetes mellitus without complication ([place])   ? History of kidney stones   ? Hypertension   ? Lung cancer ([place])   ? Stroke (cerebrum) ([place])   ? a. 04/2021 MRI brain: Acute infarct post limb of R internal capsule.  ? Thoracic aortic aneurysm ([place])   ? Tobacco abuse   ? ? ?Past Surgical History: ?Past Surgical History:  ?Procedure Laterality Date  ? ABDOMINAL HYSTERECTOMY    ? CHOLECYSTECTOMY    ? EYE SURGERY Bilateral   ? KYPHOPLASTY N/A 07/30/2020  ? Procedure: T10 Kyphoplasty;  Surgeon: Hessie Knows, MD;  Location: [facility] ORS;  Service: Orthopedics;  Laterality: N/A;  T10  ? KYPHOPLASTY N/A 08/19/2020  ? Procedure: T9 YPHOPLASTY;  Surgeon: Hessie Knows, MD;  Location: [facility] ORS;  Service: Orthopedics;  Laterality: N/A;  ? ? ?Problem List: ?Patient  Active Problem List  ? Diagnosis Date Noted  ? Hemiparesis affecting left side as late effect of cerebrovascular accident (CVA) ([place]) 09/02/2021  ? Generalized weakness 09/02/2021  ? Fall at home, initial encounter 09/02/2021  ? T12 compression fracture, initial encounter ([place]) 09/02/2021  ? Fall 09/02/2021  ? Constipation 09/02/2021  ? Malnutrition of moderate degree 08/16/2021  ? Tremor 08/14/2021  ? Disorder of electrolytes 08/14/2021  ? Hypocalcemia 08/14/2021  ? Hypomagnesemia 08/14/2021  ? Chronic combined systolic and diastolic heart failure ([place]) 08/14/2021  ? HLD (hyperlipidemia) 08/14/2021  ? COPD (chronic obstructive pulmonary disease) ([place]) 08/14/2021  ? Cellulitis of left lower extremity 08/14/2021  ? Sepsis ([place]) 08/14/2021  ? UTI (urinary tract infection) 08/14/2021  ? Coronary artery disease involving native coronary artery of native heart without angina pectoris 07/13/2021  ? History of stroke 07/13/2021  ? Chronic HFrEF (heart failure with reduced ejection fraction) ([place]) 06/08/2021  ? Aortic atherosclerosis ([place]) 06/08/2021  ? Cardiomyopathy ([place])   ? Acute hypoxemic respiratory failure ([place]) 05/17/2021  ? Stroke [facility]) 05/17/2021  ? Primary adenocarcinoma of right lung ([place]) 08/17/2020  ? AAA (abdominal aortic aneurysm) without rupture ([place])   ? Type 2 diabetes mellitus with hyperlipidemia ([place])   ? Back pain 05/10/2020  ? Diabetes mellitus without complication ([place])   ? Essential hypertension   ? Iron  deficiency anemia   ? Lung nodule seen on imaging study   ? Depression with anxiety   ? Tobacco abuse   ? ? ?Allergies: ?Allergies as of 09/02/2021 - Review Complete 09/02/2021  ?Allergen Reaction Noted  ? Sulfa antibiotics Swelling 04/23/2015  ? ? ?Medications: ?@ENCMED @ ? ?Social History: ?Social History  ? ?Tobacco Use  ? Smoking status: Every Day  ?  Packs/day: 0.50  ?  Years: 68.00  ?  Pack years: 34.00  ?  Types: Cigarettes  ? Smokeless tobacco: Never  ?Vaping Use  ? Vaping Use: Never used   ?Substance Use Topics  ? Alcohol use: Never  ? Drug use: Never  ? ? ?Family Medical History: ?Family History  ?Problem Relation Age of Onset  ? Dementia Mother   ? Congestive Heart Failure Mother   ? Bladder Cancer Father   ? Heart disease Father   ? Breast cancer Neg Hx   ? ? ?Physical Examination: ?@VITALWITHPAIN @ ? ?General: Patient is well developed, well nourished, calm, collected, and in no apparent distress. ? ?Psychiatric: Patient is non-anxious. ? ?Head:  Pupils equal, round, and reactive to light. ? ?ENT:  Oral mucosa appears well hydrated. ? ?Neck:   Supple.  Full range of motion. ? ?Respiratory: Patient is breathing without any difficulty. ? ?Extremities: No edema. ? ?Vascular: Palpable pulses. ? ?Skin:   On exposed skin, there are no abnormal skin lesions. ? ?NEUROLOGICAL:  ?General: In no acute distress.   ?Awake, alert, oriented to person, place, and time.  Pupils equal round and reactive to light.  Facial tone is symmetric.  Tongue protrusion is midline.  There is no pronator drift. ? ? ? ?Strength: ?Side Biceps Triceps Deltoid Interossei Grip Wrist Ext. Wrist Flex.  ?R 5 5 5 5 5 5 5   ?L 5 5 5 5 5 5 5   ? ?Side Iliopsoas Quads Hamstring PF DF EHL  ?R 4+ 4+ 4+ 4+ 4+ 4+  ?L 4+ 4+ 4+ 4+ 4+ 4+  ? ?Reflexes are 2+ and symmetric at the biceps, triceps, brachioradialis, patella and achilles.   Bilateral upper and lower extremity sensation is intact to light touch and pin prick.  Clonus is not present.  Toes are down-going.  Gait is not tested.Hoffman's is absent. ? ?Imaging: ?CT THORACIC AND LUMBAR SPINE WITHOUT CONTRAST ?  ?TECHNIQUE: ?Multidetector CT imaging of the thoracic and lumbar spine was ?performed without intravenous contrast administration. Multiplanar ?CT image reconstructions were also generated. ?  ?RADIATION DOSE REDUCTION: This exam was performed according to the ?departmental dose-optimization program which includes automated ?exposure control, adjustment of the mA and/or kV according  to ?patient size and/or use of iterative reconstruction technique. ?  ?COMPARISON:  CT chest angiogram, 05/17/2021, CT lumbar spine, ?05/10/2020 ?  ?FINDINGS: ?Segmentation: Twelve rib bearing vertebral bodies and six lumbar ?type vertebral bodies. ?  ?Alignment: Normal thoracic kyphosis and lumbar lordosis. ?  ?Vertebrae: Osteopenia. There is a new wedge deformity of the T12 ?vertebral body, with approximately 40% anterior height loss (series ?8, image 22). Nonacute wedge deformities of T6, T7 T9, and T10, ?status post vertebral cement augmentation of T9 and 10. No fracture ?or dislocation of the lumbar vertebral bodies. Minimally displaced, ?subacute fractures of the posterior left eleventh and twelfth ribs ?(series 9, image 116, 129). ?  ?Disc levels: Mild multilevel disc space height loss and ?osteophytosis throughout the thoracic spine. Focally severe disc ?degenerative disease of L2-L3. ?  ?Paraspinal and other soft tissues: Severe aortic atherosclerosis. ?Aneurysm of  the descending thoracic aorta measuring up to 4.1 x 4.0 ?cm (series 7, image 44, series 3, image 102). Unchanged focal ?dissection and penetrating ulceration of the infrarenal abdominal ?aorta, aneurysmal measuring up to 3.4 x 3.3 cm (series 4, image 8, ?series 2, image 40). ?  ?IMPRESSION: ?1. New wedge deformity of the T11 vertebral body, with approximately ?40% anterior height loss. ?2. Nonacute wedge deformities of T6, T7 T9, and T10, status post ?vertebral cement augmentation of T9 and T10. ?3. Minimally displaced, subacute fractures of the posterior left ?eleventh and twelfth ribs. ?4. Severe aortic atherosclerosis. Unchanged focal dissection and ?penetrating ulceration of the infrarenal abdominal aorta, aneurysmal ?measuring up to 3.4 x 3.3 cm. Aneurysm of the descending thoracic ?aorta measuring up to 4.1 x 4.0 cm. Recommend follow-up every 12 ?months and vascular consultation if clinically appropriate and not ?otherwise imaged. This  recommendation follows ACR consensus ?guidelines: White Paper of the ACR Incidental Findings Committee II ?on Vascular Findings. J Am Coll Radiol 2013; 10:789-794. ?  ?Aortic Atherosclerosis (ICD10-I70.0). ? ?I

## 2021-09-03 NOTE — Progress Notes (Signed)
?  Progress Note ? ? ?Patient: Marissa Fletcher EHU:[id number] DOB: 23-Jun-1940 DOA: 09/02/2021     1 ?DOS: the patient was seen and examined on 09/03/2021 ?  ?Brief hospital course: ?No notes on file ? ?Assessment and Plan: ?* T12 compression fracture, initial encounter ([place]) ?Acute as seen on CT ?Neurosurgery consulted, no surgical intervention ?Plan: ?--MRI thoracic to assess for hematoma, per neurosurgery ?--waiting on TLSO before PT ?--upright xray with TLSO ? ?Fall at home, initial encounter ?Secondary to the above ?Plan as above ? ?Generalized weakness ?Likely acute on chronic secondary to general physical deconditioning, recent acute illness and recent fall ?--PT/OT with TLSO ? ?Constipation ?CT abdomen showed moderate fecal retention throughout the colon consistent with constipation ?--high dose Miralax ? ? ?Iron deficiency anemia ?Hemoglobin stable at 10.5 ?--cont iron supplement ? 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 ?COPD (chronic obstructive pulmonary disease) ([place]) ?Not currently acutely exacerbated ? ?Coronary artery disease involving native coronary artery of native heart without angina pectoris ?No complaints of chest pain ?Continue aspirin and atorvastatin ? ?Chronic HFrEF (heart failure with reduced ejection fraction) ([place]) ?Appears euvolemic ?--LVEF 35 to 40% with grade II DD. ?Continue carvedilol and furosemide ? ?AAA (abdominal aortic aneurysm) without rupture ([place]) ?No acute issues suspected at this time ?CT showed stable 4 cm descending thoracic aortic aneurysm and 3.3 cm ?infrarenal abdominal aortic aneurysm. Recommend follow-up every 3 ?years. ? ? ?Essential hypertension ?Losartan held on admission ?Continue carvedilol and lasix ? ?Diabetes mellitus without complication ([place]) ?Well controlled.  BG within inpatient goal. ?--d/c BG checks and SSI ? ? ? ? ?  ? ?Subjective:  ?Pt  reported feeling better today.  Back pain worse with movement.  Complained of both legs being weak. ? ? ?Physical Exam: ? ?Constitutional: NAD, AAOx3 ?HEENT: conjunctivae and lids normal, EOMI ?CV: No cyanosis.   ?RESP: normal respiratory effort, on RA ?Extremities: No effusions, edema in BLE ?SKIN: warm, dry ?Neuro: II - XII grossly intact.   ?Psych: Normal mood and affect.  Appropriate judgement and reason ? ? ?Data Reviewed: ? ?Family Communication: sister updated at bedside today ? ?Disposition: ?Status is: Inpatient ? ? Planned Discharge Destination: Rehab ? ? ? ?Time spent: 50 minutes ? ?Author: ?Enzo Bi, MD ?09/03/2021 6:55 PM ? ?For on call review [email or web address].  ?

## 2021-09-04 ENCOUNTER — Inpatient Hospital Stay: Payer: Medicare Other

## 2021-09-04 DIAGNOSIS — S22080A Wedge compression fracture of T11-T12 vertebra, initial encounter for closed fracture: Secondary | ICD-10-CM | POA: Diagnosis not present

## 2021-09-04 LAB — BASIC METABOLIC PANEL
Anion gap: 8 (ref 5–15)
BUN: 10 mg/dL (ref 8–23)
CO2: 29 mmol/L (ref 22–32)
Calcium: 8 mg/dL — ABNORMAL LOW (ref 8.9–10.3)
Chloride: 100 mmol/L (ref 98–111)
Creatinine, Ser: 0.54 mg/dL (ref 0.44–1.00)
GFR, Estimated: >60 mL/min (ref >60)
Glucose, Bld: 98 mg/dL (ref 70–99)
Potassium: 3 mmol/L — ABNORMAL LOW (ref 3.5–5.1)
Sodium: 137 mmol/L (ref 135–145)

## 2021-09-04 LAB — CBC
HCT: 31.8 % — ABNORMAL LOW (ref 36.0–46.0)
Hemoglobin: 9.6 g/dL — ABNORMAL LOW (ref 12.0–15.0)
MCH: 24.6 pg — ABNORMAL LOW (ref 26.0–34.0)
MCHC: 30.2 g/dL (ref 30.0–36.0)
MCV: 81.3 fL (ref 80.0–100.0)
Platelets: 432 10*3/uL — ABNORMAL HIGH (ref 150–400)
RBC: 3.91 MIL/uL (ref 3.87–5.11)
RDW: 21.4 % — ABNORMAL HIGH (ref 11.5–15.5)
WBC: 5.1 10*3/uL (ref 4.0–10.5)
nRBC: 0 % (ref 0.0–0.2)

## 2021-09-04 LAB — GLUCOSE, CAPILLARY: Glucose-Capillary: 119 mg/dL — ABNORMAL HIGH (ref 70–99)

## 2021-09-04 LAB — MAGNESIUM: Magnesium: 1.9 mg/dL (ref 1.7–2.4)

## 2021-09-04 IMAGING — CR DG THORACIC SPINE 2V
1 series · 5 of 5 positions shown · non-contrast
Comparison: Thoracic spine MRI, [DATE]. Corresponding CT,
[DATE].

CLINICAL DATA: T12 compression fracture.

EXAM:
THORACIC SPINE 2 VIEWS

[Series 1: dg thoracic spine 2 view · 0.14mm/px · 5 of 5 slices shown]
[im 1/5]
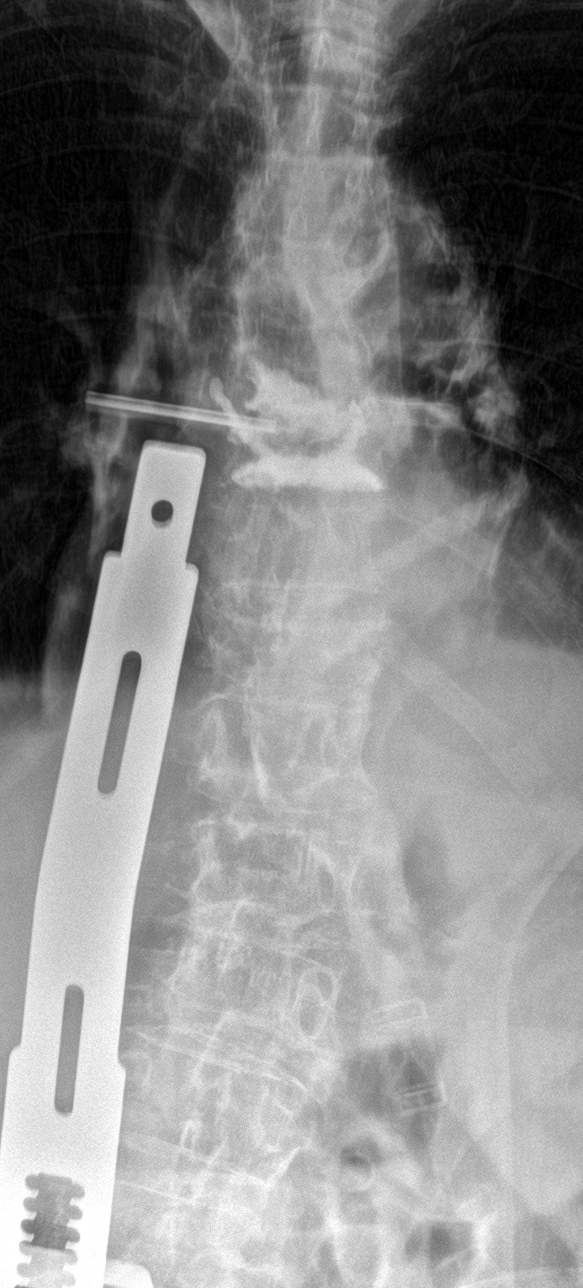
[im 2/5]
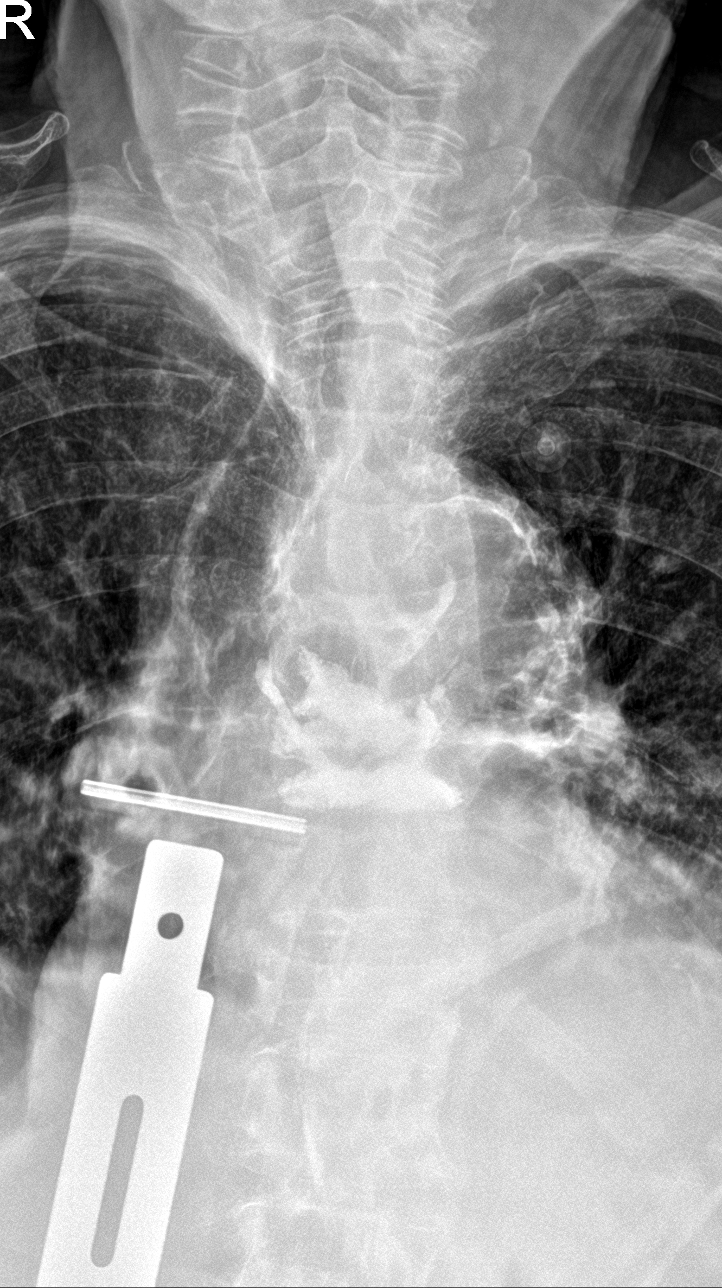
[im 3/5]
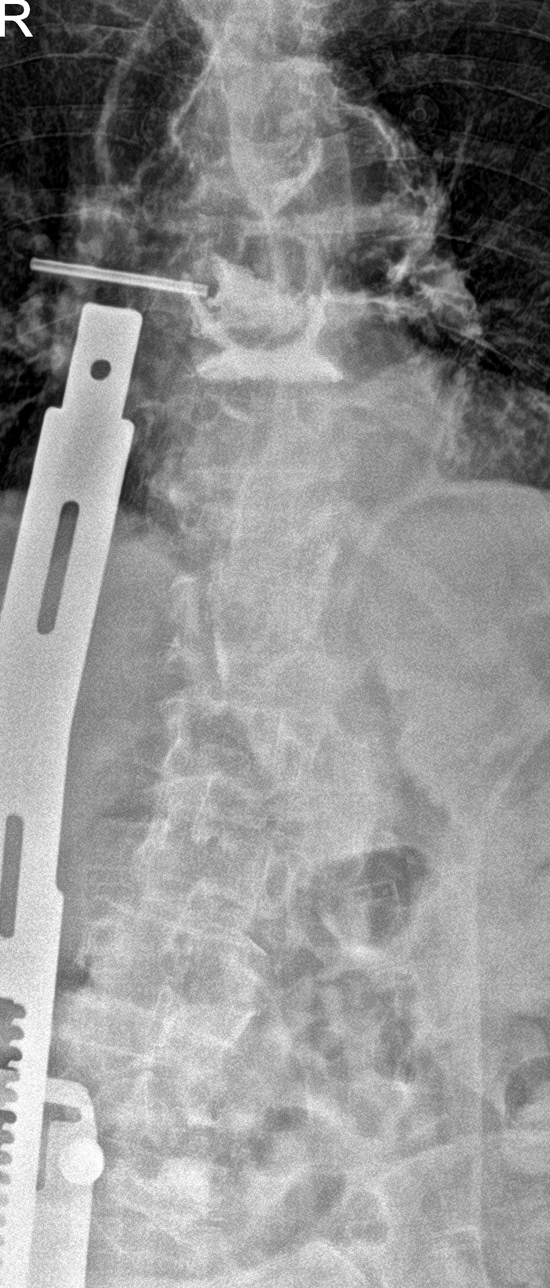
[im 4/5]
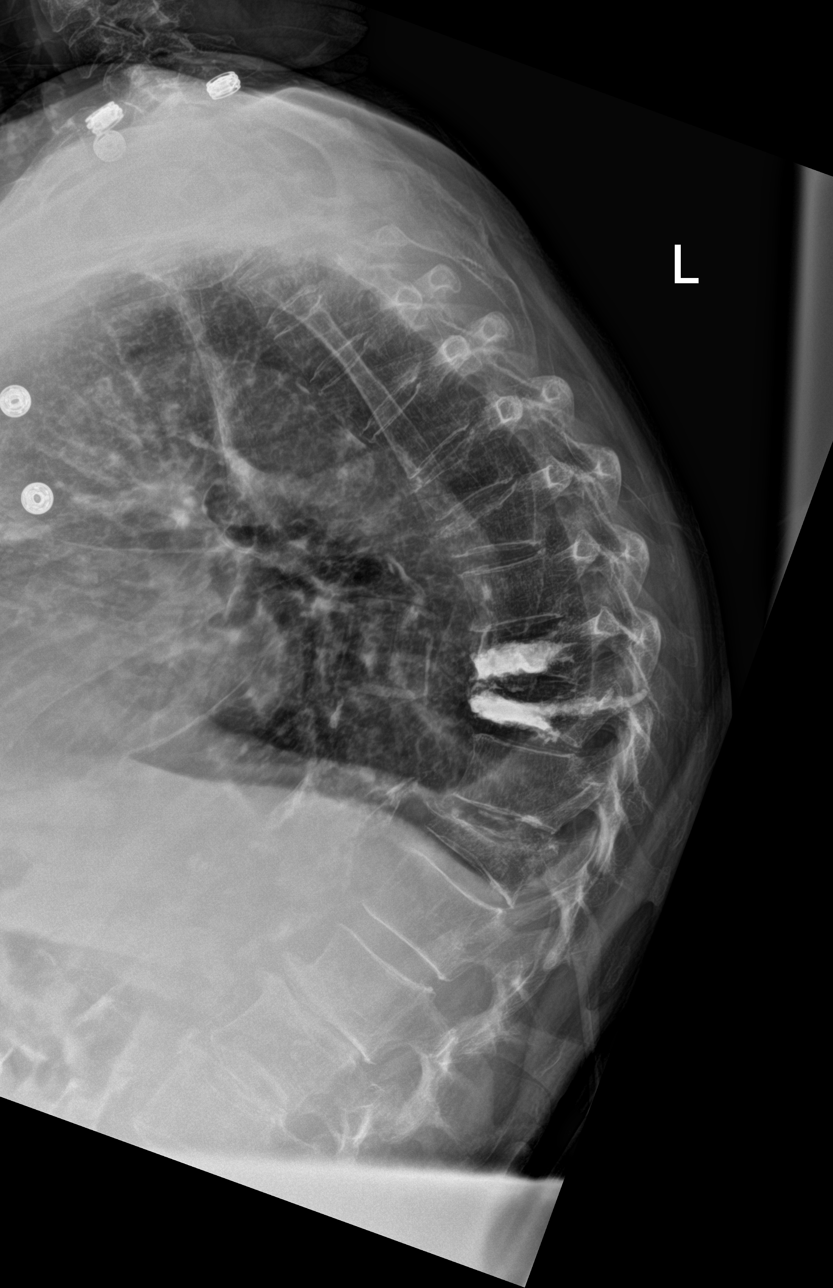
[im 5/5]
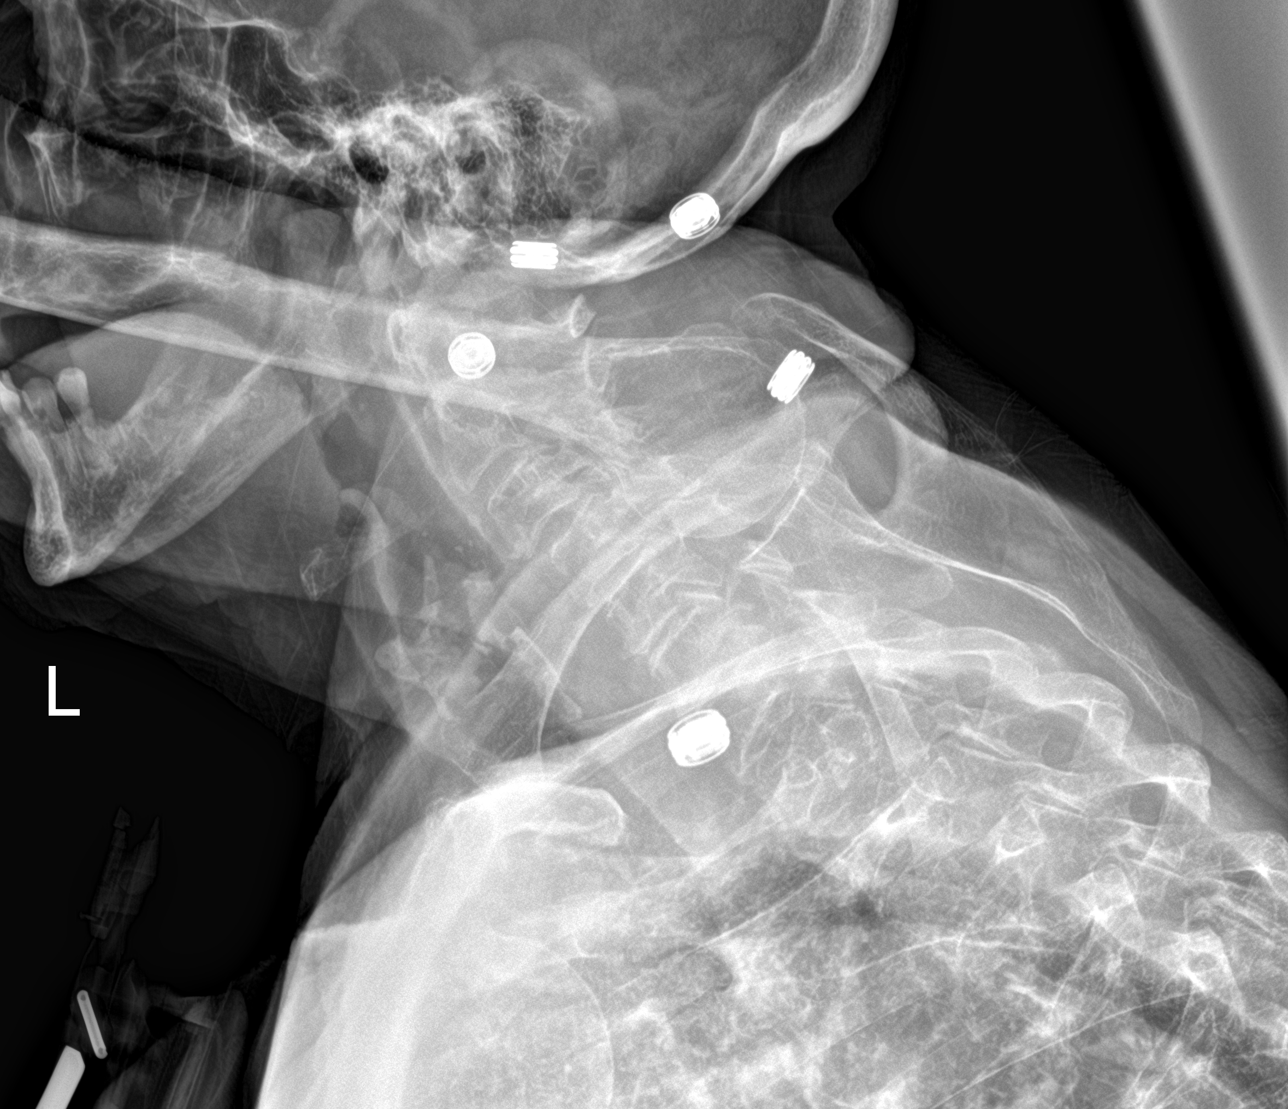

[5 of 5 positions shown; findings below may reference images not displayed]

FINDINGS: Multiple vertebral fractures. Mild compression fractures noted T6
and T7. Moderate compression fractures, treated with vertebroplasty,
noted of T9 and T10. Moderate compression fracture of T12, which may
be mildly increased in severity from the prior CT thoracic spine and
MRI.

No other fractures. No malalignment. Skeletal structures are
demineralized.
IMPRESSION: 1. Moderate compression fracture of T12, which may have increased in
severity from the recent thoracic CT and MRI.
2. No other change. Multiple additional compression fractures that
are stable from the prior exam, T9 and T10 previously treated with
vertebroplasty.

## 2021-09-04 MED ORDER — NICOTINE 21 MG/24HR TD PT24
21.0000 mg | MEDICATED_PATCH | Freq: Every day | TRANSDERMAL | Status: DC
  Administered 2021-09-04 – 2021-09-06 (×3): 21 mg via TRANSDERMAL
  Filled 2021-09-04 (×3): qty 1

## 2021-09-04 MED ORDER — POTASSIUM CHLORIDE CRYS ER 20 MEQ PO TBCR
40.0000 meq | EXTENDED_RELEASE_TABLET | ORAL | Status: AC
  Administered 2021-09-04 (×2): 40 meq via ORAL
  Filled 2021-09-04 (×2): qty 2

## 2021-09-04 MED ORDER — GABAPENTIN 600 MG PO TABS
300.0000 mg | ORAL_TABLET | Freq: Once | ORAL | Status: AC
  Administered 2021-09-04: 300 mg via ORAL
  Filled 2021-09-04: qty 1

## 2021-09-04 NOTE — Progress Notes (Signed)
?  Progress Note ? ? ?Patient: Marissa Fletcher OFB:[id number] DOB: [birth year] DOA: 09/02/2021     2 ?DOS: the patient was seen and examined on 09/04/2021 ?  ?Brief hospital course: ?No notes on file ? ?Assessment and Plan: ?* T12 compression fracture, initial encounter ([place]) ?Acute as seen on CT ?Neurosurgery consulted, no surgical intervention ?--MRI thoracic showed Small paravertebral hematoma. No epidural hematoma. ?Plan: ?--upright xray with TLSO ?--TLSO when out of bed ? ?Fall at home, initial encounter ?Secondary to the above ?Plan as above ? ?Generalized weakness ?Likely acute on chronic secondary to general physical deconditioning, recent acute illness and recent fall ?--PT/OT with TLSO out of bed ? ?Constipation ?CT abdomen showed moderate fecal retention throughout the colon consistent with constipation ?--high dose Miralax ? ? ?Iron deficiency anemia ?Hemoglobin stable at 10.5 ?--cont iron supplement ? ?Hemiparesis affecting left side as late effect of cerebrovascular accident (CVA) ([place]) ?Increase nursing assistance required due to left-sided weakness ? ?COPD (chronic obstructive pulmonary disease) ([place]) ?Not currently acutely exacerbated ? ?Coronary artery disease involving native coronary artery of native heart without angina pectoris ?No complaints of chest pain ?Continue aspirin and atorvastatin ? ?Chronic HFrEF (heart failure with reduced ejection fraction) ([place]) ?Appears euvolemic ?--LVEF 35 to 40% with grade II DD. ?Continue carvedilol and furosemide ? ?AAA (abdominal aortic aneurysm) without rupture ([place]) ?No acute issues suspected at this time ?CT showed stable 4 cm descending thoracic aortic aneurysm and 3.3 cm ?infrarenal abdominal aortic aneurysm. Recommend follow-up every 3 ?years. ? ? ?Essential hypertension ?Losartan held on admission ?Continue carvedilol and lasix ? ?Diabetes mellitus without complication ([place]) ?Well controlled.  BG within inpatient goal. ?--d/c'ed BG checks and SSI ? ? ? ? ?   ? ?Subjective:  ?Pt reported pain and nausea.   ? ? ?Physical Exam: ? ?Constitutional: NAD, AAOx3 ?HEENT: conjunctivae and lids normal, EOMI ?CV: No cyanosis.   ?RESP: normal respiratory effort, on RA ?SKIN: warm, dry ?Neuro: II - XII grossly intact.   ? ? ?Data Reviewed: ? ?Family Communication:  ? ?Disposition: ?Status is: Inpatient ? ? Planned Discharge Destination: Rehab ? ? ? ?Time spent: 35 minutes ? ?Author: ?Enzo Bi, MD ?09/04/2021 2:41 PM ? ?For on call review [email or web address]  ?

## 2021-09-04 NOTE — TOC Initial Note (Signed)
Transition of Care (TOC) - Initial/Assessment Note  ? ? ?Patient Details  ?Name: Marissa Fletcher ?MRN: [id number] ?Date of Birth: [birth year] ? ?Transition of Care (TOC) CM/SW Contact:    ?Vanya Carberry E Merci Walthers, LCSW ?Phone Number: ?09/04/2021, 2:12 PM ? ?Clinical Narrative:                Completed assessment with patient due to high readmission risk score.  ?Patient stated she lives alone. She drives herself to appointments. PCP is Dr. Lovie Macadamia. [facility]. ?Patient went to [facility] 3/31, said she discharged from there and went back home on 4/11. Patient stated she is active with [facility]. Patient has a bedside commode, RW, and shower chair at home.  ?Patient states her plan is to return home with home health.  ? ? ?Expected Discharge Plan: [place] ?Barriers to Discharge: Continued Medical Work up ? ? ?Patient Goals and CMS Choice ?Patient states their goals for this hospitalization and ongoing recovery are:: home, resume home health ?CMS Medicare.gov Compare Post Acute Care list provided to:: Patient ?Choice offered to / list presented to : Patient ? ?Expected Discharge Plan and Services ?Expected Discharge Plan: [place] ?  ?  ?  ?Living arrangements for the past 2 months: [place] ?                ?  ?  ?  ?  ?  ?  ?  ?  ?  ?  ? ?Prior Living Arrangements/Services ?Living arrangements for the past 2 months: [place] ?Lives with:: Self ?Patient language and need for interpreter reviewed:: Yes ?Do you feel safe going back to the place where you live?: Yes      ?Need for Family Participation in Patient Care: Yes (Comment) ?Care giver support system in place?: Yes (comment) ?Current home services: DME, Home PT ?Criminal Activity/Legal Involvement Pertinent to Current Situation/Hospitalization: No - Comment as needed ? ?Activities of Daily Living ?Home Assistive Devices/Equipment: Gilford Rile (specify type) ?ADL Screening (condition  at time of admission) ?Patient's cognitive ability adequate to safely complete daily activities?: Yes ?Is the patient deaf or have difficulty hearing?: No ?Does the patient have difficulty seeing, even when wearing glasses/contacts?: No ?Does the patient have difficulty concentrating, remembering, or making decisions?: No ?Patient able to express need for assistance with ADLs?: Yes ?Does the patient have difficulty dressing or bathing?: No ?Independently performs ADLs?: Yes (appropriate for developmental age) ?Does the patient have difficulty walking or climbing stairs?: Yes ?Weakness of Legs: Both ?Weakness of Arms/Hands: Both ? ?Permission Sought/Granted ?Permission sought to share information with : Customer service manager ?Permission granted to share information with : Yes, Verbal Permission Granted ?   ? Permission granted to share info w AGENCY: [facility] ?   ?   ? ?Emotional Assessment ?  ?  ?  ?Orientation: : Oriented to Self, Oriented to Place, Oriented to  Time, Oriented to Situation ?Alcohol / Substance Use: Not Applicable ?Psych Involvement: No (comment) ? ?Admission diagnosis:  Weakness [R53.1] ?Fall [W19.XXXA] ?Fall, initial encounter [W19.XXXA] ?Compression fracture of T12 vertebra, initial encounter ([place]) [B15.176H] ?Closed fracture of multiple ribs of left side with routine healing, subsequent encounter [S22.42XD] ?Patient Active Problem List  ? Diagnosis Date Noted  ? 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 Generalized weakness 09/02/2021  ? Fall at home, initial encounter 09/02/2021  ? T12 compression  fracture, initial encounter ([place]) 09/02/2021  ? Constipation 09/02/2021  ? Malnutrition of moderate degree 08/16/2021  ? Tremor 08/14/2021  ? Disorder of electrolytes 08/14/2021  ? Hypocalcemia 08/14/2021  ? Hypomagnesemia 08/14/2021  ? Chronic combined systolic and diastolic heart failure ([place]) 08/14/2021  ? HLD (hyperlipidemia)  08/14/2021  ? COPD (chronic obstructive pulmonary disease) ([place]) 08/14/2021  ? Cellulitis of left lower extremity 08/14/2021  ? Sepsis ([place]) 08/14/2021  ? UTI (urinary tract infection) 08/14/2021  ? Coronary artery disease involving native coronary artery of native heart without angina pectoris 07/13/2021  ? History of stroke 07/13/2021  ? Chronic HFrEF (heart failure with reduced ejection fraction) ([place]) 06/08/2021  ? Aortic atherosclerosis ([place]) 06/08/2021  ? Cardiomyopathy ([place])   ? Acute hypoxemic respiratory failure ([place]) 05/17/2021  ? Stroke [facility]) 05/17/2021  ? 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 AAA (abdominal aortic aneurysm) without rupture ([place])   ? Type 2 diabetes mellitus with hyperlipidemia ([place])   ? Back pain 05/10/2020  ? Diabetes mellitus without complication ([place])   ? Essential hypertension   ? Iron deficiency anemia   ? Lung nodule seen on imaging study   ? Depression with anxiety   ? Tobacco abuse   ? ?PCP:  Juluis Pitch, MD ?Pharmacy:   ?[facility] [id number], [place] ?[place] ?[place] [address] ?Phone: [phone] Fax: [phone] ? ? ? ? ?Social Determinants of Health (SDOH) Interventions ?  ? ?Readmission Risk Interventions ? ?  09/04/2021  ?  2:12 PM 08/15/2021  ?  3:40 PM  ?Readmission Risk Prevention Plan  ?Transportation Screening Complete Complete  ?PCP or Specialist Appt within 3-5 Days Complete Complete  ?[place] or Home Care Consult Complete Complete  ?Social Work Consult for [place] Planning/Counseling Complete   ?Palliative Care Screening Not Applicable Not Applicable  ?Medication Review Press photographer) Complete Referral to Pharmacy  ? ? ? ?

## 2021-09-04 NOTE — Plan of Care (Signed)

## 2021-09-05 DIAGNOSIS — S22080A Wedge compression fracture of T11-T12 vertebra, initial encounter for closed fracture: Secondary | ICD-10-CM | POA: Diagnosis not present

## 2021-09-05 LAB — BASIC METABOLIC PANEL
Anion gap: 6 (ref 5–15)
BUN: 9 mg/dL (ref 8–23)
CO2: 30 mmol/L (ref 22–32)
Calcium: 8.3 mg/dL — ABNORMAL LOW (ref 8.9–10.3)
Chloride: 102 mmol/L (ref 98–111)
Creatinine, Ser: 0.6 mg/dL (ref 0.44–1.00)
GFR, Estimated: >60 mL/min (ref >60)
Glucose, Bld: 99 mg/dL (ref 70–99)
Potassium: 4.4 mmol/L (ref 3.5–5.1)
Sodium: 138 mmol/L (ref 135–145)

## 2021-09-05 LAB — CBC
HCT: 34 % — ABNORMAL LOW (ref 36.0–46.0)
Hemoglobin: 9.9 g/dL — ABNORMAL LOW (ref 12.0–15.0)
MCH: 24.3 pg — ABNORMAL LOW (ref 26.0–34.0)
MCHC: 29.1 g/dL — ABNORMAL LOW (ref 30.0–36.0)
MCV: 83.3 fL (ref 80.0–100.0)
Platelets: 444 10*3/uL — ABNORMAL HIGH (ref 150–400)
RBC: 4.08 MIL/uL (ref 3.87–5.11)
RDW: 21.9 % — ABNORMAL HIGH (ref 11.5–15.5)
WBC: 4.5 10*3/uL (ref 4.0–10.5)
nRBC: 0 % (ref 0.0–0.2)

## 2021-09-05 LAB — MAGNESIUM: Magnesium: 1.9 mg/dL (ref 1.7–2.4)

## 2021-09-05 LAB — GLUCOSE, CAPILLARY: Glucose-Capillary: 141 mg/dL — ABNORMAL HIGH (ref 70–99)

## 2021-09-05 NOTE — [place] FL2 (Signed)
?[place] MEDICAID FL2 LEVEL OF CARE SCREENING TOOL  ?  ? ?IDENTIFICATION  ?Patient Name: ?Marissa Fletcher Birthdate: 12-Mar-1941 Sex: female Admission Date (Current Location): ?09/02/2021  ?[place] and [place] Number: ? [place] ?  Facility and Address:  ?[facility], [address] [place], [place] [address] ?     Provider Number: ?[id number]  ?Attending Physician Name and Address:  ?Enzo Bi, MD ? Relative Name and Phone Number:  ?Theadora Sister [phone] ?   ?Current Level of Care: ?Hospital Recommended Level of Care: ?[place] Prior Approval Number: ?  ? ?Date Approved/Denied: ?  PASRR Number: ?[id number] A ? ?Discharge Plan: ?SNF ?  ? ?Current Diagnoses: ?Patient Active Problem List  ? Diagnosis Date Noted  ? 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 Generalized weakness 09/02/2021  ? Fall at home, initial encounter 09/02/2021  ? T12 compression fracture, initial encounter ([place]) 09/02/2021  ? Constipation 09/02/2021  ? Malnutrition of moderate degree 08/16/2021  ? Tremor 08/14/2021  ? Disorder of electrolytes 08/14/2021  ? Hypocalcemia 08/14/2021  ? Hypomagnesemia 08/14/2021  ? Chronic combined systolic and diastolic heart failure ([place]) 08/14/2021  ? HLD (hyperlipidemia) 08/14/2021  ? COPD (chronic obstructive pulmonary disease) ([place]) 08/14/2021  ? Cellulitis of left lower extremity 08/14/2021  ? Sepsis ([place]) 08/14/2021  ? UTI (urinary tract infection) 08/14/2021  ? Coronary artery disease involving native coronary artery of native heart without angina pectoris 07/13/2021  ? History of stroke 07/13/2021  ? Chronic HFrEF (heart failure with reduced ejection fraction) ([place]) 06/08/2021  ? Aortic atherosclerosis ([place]) 06/08/2021  ? Cardiomyopathy ([place])   ? Acute hypoxemic respiratory failure ([place]) 05/17/2021  ? Stroke [facility]) 05/17/2021  ? Primary adenocarcinoma of right lung ([place]) 08/17/2020  ? AAA (abdominal aortic  aneurysm) without rupture ([place])   ? Type 2 diabetes mellitus with hyperlipidemia ([place])   ? Back pain 05/10/2020  ? Diabetes mellitus without complication ([place])   ? Essential hypertension   ? Iron deficiency anemia   ? Lung nodule seen on imaging study   ? Depression with anxiety   ? Tobacco abuse   ? ? ?Orientation RESPIRATION BLADDER Height & Weight   ?  ?Self, Time, Place ? O2 (2 liters) Continent Weight: 56.2 kg ?Height:  5\' 4"  (162.6 cm)  ?BEHAVIORAL SYMPTOMS/MOOD NEUROLOGICAL BOWEL NUTRITION STATUS  ?    Continent Diet (see dc summary)  ?AMBULATORY STATUS COMMUNICATION OF NEEDS Skin   ?Extensive Assist Verbally Normal ?  ?  ?  ?    ?     ?     ? ? ?Personal Care Assistance Level of Assistance  ?Bathing, Feeding, Dressing Bathing Assistance: Limited assistance ?Feeding assistance: Independent ?Dressing Assistance: Limited assistance ?   ? ?Functional Limitations Info  ?    ?  ?   ? ? ?SPECIAL CARE FACTORS FREQUENCY  ?PT (By licensed PT), OT (By licensed OT)   ?  ?PT Frequency: 5 times per week ?OT Frequency: 5 times per week ?  ?  ?  ?   ? ? ?Contractures Contractures Info: Not present  ? ? ?Additional Factors Info  ?Code Status, Allergies Code Status Info: DNR ?Allergies Info: Sulaf ANtibiotics ?  ?  ?  ?   ? ?Current Medications (09/05/2021):  This is the current hospital active medication list ?Current Facility-Administered Medications  ?Medication Dose Route Frequency Provider Last Rate Last Admin  ? acetaminophen (TYLENOL) tablet 650 mg  650 mg  Oral Q6H PRN Athena Masse, MD   650 mg at 09/04/21 1145  ? Or  ? acetaminophen (TYLENOL) suppository 650 mg  650 mg Rectal Q6H PRN Athena Masse, MD      ? albuterol (PROVENTIL) (2.5 MG/3ML) 0.083% nebulizer solution 3 mL  3 mL Inhalation Q6H PRN Athena Masse, MD      ? ALPRAZolam Duanne [place]on) tablet 0.25 mg  0.25 mg Oral Daily PRN Athena Masse, MD   0.25 mg at 09/04/21 1428  ? aspirin EC tablet 81 mg  81 mg Oral Daily Athena Masse, MD   81 mg at 09/05/21  4825  ? atorvastatin (LIPITOR) tablet 20 mg  20 mg Oral Q M,W,F Athena Masse, MD   20 mg at 09/05/21 0037  ? carvedilol (COREG) tablet 6.25 mg  6.25 mg Oral BID WC Judd Gaudier V, MD   6.25 mg at 09/05/21 0810  ? enoxaparin (LOVENOX) injection 40 mg  40 mg Subcutaneous Q24H Athena Masse, MD   40 mg at 09/05/21 0805  ? feeding supplement (ENSURE ENLIVE / ENSURE PLUS) liquid 237 mL  237 mL Oral BID BM Athena Masse, MD   237 mL at 09/04/21 0838  ? ferrous sulfate tablet 325 mg  325 mg Oral Daily Athena Masse, MD   325 mg at 09/05/21 0488  ? furosemide (LASIX) tablet 20 mg  20 mg Oral Daily Athena Masse, MD   20 mg at 09/05/21 8916  ? HYDROcodone-acetaminophen (NORCO/VICODIN) 5-325 MG per tablet 1 tablet  1 tablet Oral Q4H PRN Enzo Bi, MD   1 tablet at 09/05/21 0804  ? lidocaine (LIDODERM) 5 % 1 patch  1 patch Transdermal Q24H Lucrezia Starch, MD   1 patch at 09/04/21 2006  ? magnesium oxide (MAG-OX) tablet 400 mg  400 mg Oral Daily Judd Gaudier V, MD   400 mg at 09/05/21 9450  ? nicotine (NICODERM CQ - dosed in mg/24 hours) patch 21 mg  21 mg Transdermal Daily Enzo Bi, MD   21 mg at 09/05/21 3888  ? ondansetron (ZOFRAN) tablet 4 mg  4 mg Oral Q6H PRN Athena Masse, MD      ? Or  ? ondansetron [facility]) injection 4 mg  4 mg Intravenous Q6H PRN Athena Masse, MD   4 mg at 09/04/21 1145  ? ? ? ?Discharge Medications: ?Please see discharge summary for a list of discharge medications. ? ?Relevant Imaging Results: ? ?Relevant Lab Results: ? ? ?Additional Information ?SS# [id number] ? ?Conception Oms, RN ? ? ? ? ?

## 2021-09-05 NOTE — TOC Progression Note (Signed)
Transition of Care (TOC) - Progression Note  ? ? ?Patient Details  ?Name: Marissa Fletcher ?MRN: [id number] ?Date of Birth: 29-Jul-1940 ? ?Transition of Care (TOC) CM/SW Contact  ?Conception Oms, RN ?Phone Number: ?09/05/2021, 2:45 PM ? ?Clinical Narrative:   Reviewed the bed offers with the patient, she chose [facility], Notified Ursa at [employer] ? ? ? ?Expected Discharge Plan: [place] ?Barriers to Discharge: Continued Medical Work up ? ?Expected Discharge Plan and Services ?Expected Discharge Plan: [place] ?  ?  ?  ?Living arrangements for the past 2 months: [place] ?                ?  ?  ?  ?  ?  ?  ?  ?  ?  ?  ? ? ?Social Determinants of Health (SDOH) Interventions ?  ? ?Readmission Risk Interventions ? ?  09/04/2021  ?  2:12 PM 08/15/2021  ?  3:40 PM  ?Readmission Risk Prevention Plan  ?Transportation Screening Complete Complete  ?PCP or Specialist Appt within 3-5 Days Complete Complete  ?[place] or Home Care Consult Complete Complete  ?Social Work Consult for [place] Planning/Counseling Complete   ?Palliative Care Screening Not Applicable Not Applicable  ?Medication Review Press photographer) Complete Referral to Pharmacy  ? ? ?

## 2021-09-05 NOTE — Evaluation (Signed)
Occupational Therapy Evaluation ?Patient Details ?Name: Marissa Fletcher ?MRN: [id number] ?DOB: 05/09/1941 ?Today's Date: 09/05/2021 ? ? ?History of Present Illness Pt is a 81 y.o. female with medical history significant for DM, HTN, HFrEF, depression, chronic anemia, CAD, COPD, L hemiparesis from old stroke and known AAA and on plavix. Pt was recently hospitalized from 3/26 to 3/30 with sepsis 2/2 UTI and left leg cellulitis and discharged to SNF from which she was discharged home on 4/11, who presents to the ED on 4/14 with a fall and right hip pain as well as generalized weakness and constipation. Workup showed T12 compression fracture.  ? ?Clinical Impression ?  ?Pt seen for OT evaluation this date. Prior to hospital admission, pt was independent with mobility, ADL, and IADL.  However, has been having increased difficulty with all aspects of mobility and ADL due to back pain. Pt lives alone and reports that she calls her daughter in law before and after showering for safety. Since returning home from rehab recently, she reports using the 4WW for mobility and independent with ADL. She is unable to provide details of the fall. Currently, pt requires MOD A for bed mobility using log roll technique, MIN A for ADL transfers with VC for hand/foot placement to minimize posterior LOB, MIN A for seated figure 4 technique to don L sock, supv to don R sock, MOD A for other LB dressing tasks, CGA in standing for pericare, and MIN A for clothing mgt during toileting from [facility]. After sitting in recliner, pt endorsed she did get a little sweaty/clammy and hot with mobility. Orthostatics taken with mobility:  ? ?supine 132/68, PR 89 ?sitting 128/66, PR 93 ?standing 54min 92/77, PR 100 ?standing 72min 118/72, PR 103 ? ?Pt instructed in log roll technique for bed mobility, adaptive strategies to minimize bending/twisting of the spine for LB ADL, ADL transfer training with noted improvement with proper foot placement to minimize  posterior lean, and falls prevention strategies to maximize safety and functional independence while minimizing falls risk and maintaining precautions. Pt verbalized understanding and will benefit from additional skilled OT services to maximize return to PLOF. Recommending SNF at this time given increased need for assist, impairments, and lack of significant supports. Pt verbalizes hopes that perhaps her granddaughter could stay with her moving forward but unclear whether this is feasible.    ? ?Recommendations for follow up therapy are one component of a multi-disciplinary discharge planning process, led by the attending physician.  Recommendations may be updated based on patient status, additional functional criteria and insurance authorization.  ? ?Follow Up Recommendations ? Skilled nursing-short term rehab (<3 hours/day)  ?  ?Assistance Recommended at Discharge Intermittent Supervision/Assistance  ?Patient can return home with the following A little help with walking and/or transfers;A lot of help with bathing/dressing/bathroom;Assistance with cooking/housework;Assist for transportation;Help with stairs or ramp for entrance ? ?  ?Functional Status Assessment ? Patient has had a recent decline in their functional status and demonstrates the ability to make significant improvements in function in a reasonable and predictable amount of time.  ?Equipment Recommendations ? None recommended by OT  ?  ?Recommendations for Other Services   ? ? ?  ?Precautions / Restrictions Precautions ?Precautions: Fall ?Required Braces or Orthoses: Spinal Brace ?Spinal Brace: Thoracolumbosacral orthotic ?Restrictions ?Weight Bearing Restrictions: No  ? ?  ? ?Mobility Bed Mobility ?Overal bed mobility: Needs Assistance ?Bed Mobility: Rolling, Sidelying to Sit ?Rolling: Min guard ?Sidelying to sit: Mod assist ?  ?  ?  ?  General bed mobility comments: instructed in log roll requiring MOD A to sit up from sidelying ?   ? ?Transfers ?Overall transfer level: Needs assistance ?Equipment used: Rolling walker (2 wheels) ?Transfers: Sit to/from Stand ?Sit to Stand: Min assist ?  ?  ?Step pivot transfers: Min assist, Min guard ?  ?  ?General transfer comment: VC for feet and hand placement ?  ? ?  ?Balance Overall balance assessment: Needs assistance, History of Falls ?Sitting-balance support: Single extremity supported, Feet supported ?Sitting balance-Leahy Scale: Fair ?  ?Postural control: Posterior lean ?Standing balance support: Reliant on assistive device for balance, During functional activity, Bilateral upper extremity supported, Single extremity supported ?Standing balance-Leahy Scale: Poor ?Standing balance comment: VC to correct posterior lean, 2 slight LOB but able to correct with CGA-MIN A ?  ?  ?  ?  ?  ?  ?  ?  ?  ?  ?  ?   ? ?ADL either performed or assessed with clinical judgement  ? ?ADL Overall ADL's : Needs assistance/impaired ?  ?  ?  ?  ?  ?  ?  ?  ?  ?  ?  ?  ?  ?  ?  ?  ?  ?  ?  ?General ADL Comments: Pt required MIN A in sitting using figure 4 technique to don L sock, supv to don R sock; MOD A for other LB dressing tasks. MIN A for ADL transfers. CGA in standing for pericare, MIN A for clothing mgt during toileting from [facility].  ? ? ? ?Vision   ?   ?   ?Perception   ?  ?Praxis   ?  ? ?Pertinent Vitals/Pain Pain Assessment ?Pain Assessment: 0-10 ?Pain Score: 4  ?Pain Location: 8/10 at start of session, improving to 4-5/10 seated in recliner ?Pain Descriptors / Indicators: Aching ?Pain Intervention(s): Limited activity within patient's tolerance, Monitored during session, Premedicated before session, Repositioned  ? ? ? ?Hand Dominance Right ?  ?Extremity/Trunk Assessment Upper Extremity Assessment ?Upper Extremity Assessment: Generalized weakness ?  ?Lower Extremity Assessment ?Lower Extremity Assessment: Generalized weakness;Defer to PT evaluation ?  ?Cervical / Trunk Assessment ?Cervical / Trunk Assessment:  Kyphotic;Other exceptions ?Cervical / Trunk Exceptions: acute t12 compression fracture, hx of kyphoplasty x2 ?  ?Communication Communication ?Communication: No difficulties ?  ?Cognition Arousal/Alertness: Awake/alert ?Behavior During Therapy: [facility] for tasks assessed/performed ?Overall Cognitive Status: Within Functional Limits for tasks assessed ?  ?  ?  ?  ?  ?  ?  ?  ?  ?  ?  ?  ?  ?  ?  ?  ?  ?  ?  ?General Comments    ? ?  ?Exercises Other Exercises ?Other Exercises: Pt instructed in log roll technique for bed mobility, adaptive strategies to minimize bending/twisting of the spine for LB ADL, ADL transfer training with noted improvement with proper foot placement to minimize posterior lean ?  ?Shoulder Instructions    ? ? ?Home Living Family/patient expects to be discharged to:: Private residence (apartment) ?Living Arrangements: Alone ?Available Help at Discharge: Family;Friend(s);Neighbor;Available PRN/intermittently ?Type of Home: Apartment ?Home Access: Level entry ?  ?  ?Home Layout: One level ?  ?  ?Bathroom Shower/Tub: Tub/shower unit ?  ?Bathroom Toilet: Standard ?  ?  ?Home Equipment: International aid/development worker (2 wheels);Rollator (4 wheels) ?  ?  ?  ? ?  ?Prior Functioning/Environment Prior Level of Function : Independent/Modified Independent;History of Falls (last six months) ?  ?  ?  ?  ?  ?  ?  Mobility Comments: since d/c home from SNF, was ambulating with 4WW, unable to recall why she fell ?ADLs Comments: reports cooking, would call her daughter in law before and after showering for safety and has a medical alert pendant at home ?  ? ?  ?  ?OT Problem List: Decreased strength;Decreased activity tolerance;Impaired balance (sitting and/or standing);Decreased safety awareness;Pain;Decreased range of motion;Decreased knowledge of precautions;Decreased knowledge of use of DME or AE ?  ?   ?OT Treatment/Interventions: Self-care/ADL training;Therapeutic exercise;Energy conservation;DME and/or AE  instruction;Therapeutic activities;Patient/family education;Balance training  ?  ?OT Goals(Current goals can be found in the care plan section) Acute Rehab OT Goals ?Patient Stated Goal: to go home ?OT Goal Formulation: With patient ?Tim

## 2021-09-05 NOTE — Progress Notes (Signed)
?  Progress Note ? ? ?Patient: Marissa Fletcher GBT:[id number] DOB: [birth year] DOA: 09/02/2021     3 ?DOS: the patient was seen and examined on 09/05/2021 ?  ?Brief hospital course: ?No notes on file ? ?Assessment and Plan: ?* T12 compression fracture, initial encounter ([place]) ?Acute as seen on CT ?Neurosurgery consulted, no surgical intervention ?--MRI thoracic showed Small paravertebral hematoma. No epidural hematoma. ?Plan: ?--TLSO when out of bed ? ?Fall at home, initial encounter ?Secondary to the above ?Plan as above ? ?Generalized weakness ?Likely acute on chronic secondary to general physical deconditioning, recent acute illness and recent fall ?--PT/OT  ? ?Constipation ?CT abdomen showed moderate fecal retention throughout the colon consistent with constipation ?--pt reported constipation resolved ? ? ?Iron deficiency anemia ?Hemoglobin stable at 10.5 ?--cont iron supplement ? 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 ?COPD (chronic obstructive pulmonary disease) ([place]) ?Not currently acutely exacerbated ? ?Coronary artery disease involving native coronary artery of native heart without angina pectoris ?No complaints of chest pain ?Continue aspirin and atorvastatin ? ?Chronic HFrEF (heart failure with reduced ejection fraction) ([place]) ?Appears euvolemic ?--LVEF 35 to 40% with grade II DD. ?Continue carvedilol and furosemide ? 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 Recommend follow-up every 3 ?years. ? ? ?Essential hypertension ?Losartan held on admission ?Continue carvedilol and lasix ? ?Diabetes mellitus without complication ([place]) ?Well controlled.  BG within inpatient goal. ?--d/c'ed BG checks and SSI ? ? ? ? ?  ? ?Subjective:  ?Pt  complained of right lateral rib cage hurting.  Did not like wearing TLSO which doesn't quite fit. ? ? ?Physical Exam: ? ?Constitutional: NAD, AAOx3 ?HEENT: conjunctivae and lids normal, EOMI ?CV: No cyanosis.   ?RESP: normal respiratory effort, on RA ?Neuro: II - XII grossly intact.   ?Psych: Normal mood and affect.   ? ? ?Data Reviewed: ? ?Family Communication:  ? ?Disposition: ?Status is: Inpatient ? ? Planned Discharge Destination: Rehab ? ? ? ?Time spent: 35 minutes ? ?Author: ?Enzo Bi, MD ?09/05/2021 7:43 PM ? ?For on call review [email or web address].  ?

## 2021-09-05 NOTE — Care Management Important Message (Signed)
Important Message ? ?Patient Details  ?Name: Marissa Fletcher ?MRN: [id number] ?Date of Birth: 06-Feb-1941 ? ? ?Medicare Important Message Given:  Yes ? ? ? ? ?Dannette Barbara ?09/05/2021, 1:46 PM ?

## 2021-09-05 NOTE — TOC Progression Note (Signed)
Transition of Care (TOC) - Progression Note  ? ? ?Patient Details  ?Name: KOLINA KUBE ?MRN: [id number] ?Date of Birth: 05-12-1941 ? ?Transition of Care (TOC) CM/SW Contact  ?Conception Oms, RN ?Phone Number: ?09/05/2021, 9:35 AM ? ?Clinical Narrative:   the patient has 4 days remaining for SNF, then will be in copay days, Bedsearhc sent PASSR obtained, FL2 completed, Will review offers once obtained ? ? ? ?Expected Discharge Plan: [place] ?Barriers to Discharge: Continued Medical Work up ? ?Expected Discharge Plan and Services ?Expected Discharge Plan: [place] ?  ?  ?  ?Living arrangements for the past 2 months: [place] ?                ?  ?  ?  ?  ?  ?  ?  ?  ?  ?  ? ? ?Social Determinants of Health (SDOH) Interventions ?  ? ?Readmission Risk Interventions ? ?  09/04/2021  ?  2:12 PM 08/15/2021  ?  3:40 PM  ?Readmission Risk Prevention Plan  ?Transportation Screening Complete Complete  ?PCP or Specialist Appt within 3-5 Days Complete Complete  ?[place] or Home Care Consult Complete Complete  ?Social Work Consult for [place] Planning/Counseling Complete   ?Palliative Care Screening Not Applicable Not Applicable  ?Medication Review Press photographer) Complete Referral to Pharmacy  ? ? ?

## 2021-09-05 NOTE — Evaluation (Addendum)
Physical Therapy Evaluation ?Patient Details ?Name: Marissa Fletcher ?MRN: [id number] ?DOB: 1940-05-29 ?Today's Date: 09/05/2021 ? ?History of Present Illness ? Pt is a 81 y.o. female with medical history significant for DM, HTN, HFrEF, depression, chronic anemia, CAD, COPD, L hemiparesis from old stroke and known AAA and on plavix. Pt was recently hospitalized from 3/26 to 3/30 with sepsis 2/2 UTI and left leg cellulitis and discharged to SNF from which she was discharged home on 4/11, who presents to the ED on 4/14 with a fall and right hip pain as well as generalized weakness and constipation. Workup showed T12 compression fracture. ?  ?Clinical Impression ? Patient alert, agreeable to PT and up in chair, eager to transfer back to bed, reported 6/10 pain in back and L flank. She was able to perform sit <> stand with minA and RW, cued for hand placement. Transferred to EOB with CGA and RW ~24ft, declined further mobility. Returned to supine via log roll, minA. She was able to perform several seated exercises and supine exercises as well, verbal cues. TLSO attempted to be adjusted by PT, appears to be too large, MD contacted.  Overall the patient demonstrated deficits (see "PT Problem List") that impede the patient's functional abilities, safety, and mobility and would benefit from skilled PT intervention. Recommendation is SNF at this time due to decreased assistance at home as well as current need of physical assist for mobility at this time.    ?   ? ?Recommendations for follow up therapy are one component of a multi-disciplinary discharge planning process, led by the attending physician.  Recommendations may be updated based on patient status, additional functional criteria and insurance authorization. ? ?Follow Up Recommendations Skilled nursing-short term rehab (<3 hours/day) ? ?  ?Assistance Recommended at Discharge Intermittent Supervision/Assistance  ?Patient can return home with the following ? A little help  with walking and/or transfers;A little help with bathing/dressing/bathroom;Assistance with cooking/housework;Assist for transportation;Help with stairs or ramp for entrance ? ?  ?Equipment Recommendations None recommended by PT  ?Recommendations for Other Services ?    ?  ?Functional Status Assessment Patient has had a recent decline in their functional status and demonstrates the ability to make significant improvements in function in a reasonable and predictable amount of time.  ? ?  ?Precautions / Restrictions Precautions ?Precautions: Fall ?Required Braces or Orthoses: Spinal Brace ?Spinal Brace: Thoracolumbosacral orthotic ?Restrictions ?Weight Bearing Restrictions: No  ? ?  ? ?Mobility ? Bed Mobility ?Overal bed mobility: Needs Assistance ?Bed Mobility: Sit to Sidelying, Rolling ?Rolling: Min guard ?  ?  ?  ?Sit to sidelying: Min assist ?General bed mobility comments: instructed in log roll requiring minA for LE ?  ? ?Transfers ?Overall transfer level: Needs assistance ?Equipment used: Rolling walker (2 wheels) ?Transfers: Sit to/from Stand ?Sit to Stand: Min assist ?  ?  ?  ?  ?  ?  ?  ? ?Ambulation/Gait ?Ambulation/Gait assistance: Min guard ?Gait Distance (Feet): 2 Feet ?Assistive device: Rolling walker (2 wheels) ?  ?  ?  ?  ?General Gait Details: decreased confidence, declined further mobility at this time ? ?Stairs ?  ?  ?  ?  ?  ? ?Wheelchair Mobility ?  ? ?Modified Rankin (Stroke Patients Only) ?  ? ?  ? ?Balance Overall balance assessment: Needs assistance, History of Falls ?Sitting-balance support: Single extremity supported, Feet supported ?Sitting balance-Leahy Scale: Fair ?  ?  ?Standing balance support: Reliant on assistive device for balance, During functional activity,  Bilateral upper extremity supported, Single extremity supported ?Standing balance-Leahy Scale: Poor ?Standing balance comment: no true LOB, but pt fatigued ?  ?  ?  ?  ?  ?  ?  ?  ?  ?  ?  ?   ? ? ? ?Pertinent Vitals/Pain Pain  Assessment ?Pain Assessment: 0-10 ?Pain Score: 6  ?Pain Location: back, ribs ?Pain Descriptors / Indicators: Aching, Sore ?Pain Intervention(s): Limited activity within patient's tolerance, Monitored during session, Premedicated before session, Repositioned  ? ? ?Home Living Family/patient expects to be discharged to:: Private residence (apartment) ?Living Arrangements: Alone ?Available Help at Discharge: Family;Friend(s);Neighbor;Available PRN/intermittently ?Type of Home: Apartment ?Home Access: Level entry ?  ?  ?  ?Home Layout: One level ?Home Equipment: International aid/development worker (2 wheels);Rollator (4 wheels) ?   ?  ?Prior Function Prior Level of Function : Independent/Modified Independent;History of Falls (last six months) ?  ?  ?  ?  ?  ?  ?Mobility Comments: since d/c home from SNF, was ambulating with 4WW, unable to recall why she fell ?ADLs Comments: reports cooking, would call her daughter in law before and after showering for safety and has a medical alert pendant at home ?  ? ? ?Hand Dominance  ? Dominant Hand: Right ? ?  ?Extremity/Trunk Assessment  ? Upper Extremity Assessment ?Upper Extremity Assessment: Defer to OT evaluation ?  ? ?Lower Extremity Assessment ?Lower Extremity Assessment: Generalized weakness ?  ? ?Cervical / Trunk Assessment ?Cervical / Trunk Assessment: Kyphotic;Other exceptions ?Cervical / Trunk Exceptions: acute t12 compression fracture, hx of kyphoplasty x2  ?Communication  ? Communication: No difficulties  ?Cognition Arousal/Alertness: Awake/alert ?Behavior During Therapy: [facility] for tasks assessed/performed ?Overall Cognitive Status: Within Functional Limits for tasks assessed ?  ?  ?  ?  ?  ?  ?  ?  ?  ?  ?  ?  ?  ?  ?  ?  ?General Comments: patient is able to follow single step commands without difficulty ?  ?  ? ?  ?General Comments   ? ?  ?Exercises    ? ?Assessment/Plan  ?  ?PT Assessment Patient needs continued PT services  ?PT Problem List Decreased  strength;Decreased mobility;Decreased safety awareness;Decreased range of motion;Decreased coordination;Decreased activity tolerance;Decreased balance ? ?   ?  ?PT Treatment Interventions DME instruction;Therapeutic exercise;Gait training;Balance training;Stair training;Neuromuscular re-education;Functional mobility training;Therapeutic activities;Patient/family education   ? ?PT Goals (Current goals can be found in the Care Plan section)  ?Acute Rehab PT Goals ?Patient Stated Goal: to go home ?PT Goal Formulation: With patient ?Time For Goal Achievement: 09/19/21 ?Potential to Achieve Goals: Fair ? ?  ?Frequency 7X/week ?  ? ? ?Co-evaluation   ?  ?  ?  ?  ? ? ?  ?AM-PAC PT "6 Clicks" Mobility  ?Outcome Measure Help needed turning from your back to your side while in a flat bed without using bedrails?: A Little ?Help needed moving from lying on your back to sitting on the side of a flat bed without using bedrails?: A Little ?Help needed moving to and from a bed to a chair (including a wheelchair)?: A Little ?Help needed standing up from a chair using your arms (e.g., wheelchair or bedside chair)?: A Little ?Help needed to walk in hospital room?: A Little ?Help needed climbing 3-5 steps with a railing? : A Lot ?6 Click Score: 17 ? ?  ?End of Session   ?Activity Tolerance: Patient tolerated treatment well ?Patient left: in bed;with call bell/phone within  reach;with bed alarm set ?Nurse Communication: Mobility status ?PT Visit Diagnosis: Unsteadiness on feet (R26.81);History of falling (Z91.81);Muscle weakness (generalized) (M62.81);Other abnormalities of gait and mobility (R26.89) ?  ? ?Time: 1012-1027 ?PT Time Calculation (min) (ACUTE ONLY): 15 min ? ? ?Charges:   PT Evaluation ?$PT Eval Low Complexity: 1 Low ?  ?  ?   ? ? ?Lieutenant Diego PT, DPT ?1:23 PM,09/05/21 ? ? ?

## 2021-09-05 NOTE — Progress Notes (Signed)
Orthopedic Tech Progress Note ?Patient Details:  ?ELONI DARIUS ?[birth year] ?[id number] ? ?Called in order to [facility] for LSO  ? ?Patient ID: [name], female   DOB: 1941-01-07, 81 y.o.   MRN: [id number] ? ?Janit Pagan ?09/05/2021, 6:32 PM ? ?

## 2021-09-06 DIAGNOSIS — S22080A Wedge compression fracture of T11-T12 vertebra, initial encounter for closed fracture: Secondary | ICD-10-CM | POA: Diagnosis not present

## 2021-09-06 MED ORDER — LIDOCAINE 5 % EX PTCH: 2.0000 | MEDICATED_PATCH | CUTANEOUS | Status: DC

## 2021-09-06 MED ORDER — LIDOCAINE 5 % EX PTCH: 2.0000 | MEDICATED_PATCH | CUTANEOUS | 0 refills | Status: DC

## 2021-09-06 MED ORDER — HYDROCODONE-ACETAMINOPHEN 5-325 MG PO TABS
1.0000 | ORAL_TABLET | Freq: Four times a day (QID) | ORAL | 0 refills | Status: AC | PRN
Start: 2021-09-06 — End: 2021-09-11

## 2021-09-06 NOTE — Progress Notes (Signed)
Physical Therapy Treatment ?Patient Details ?Name: Marissa Fletcher ?MRN: [id number] ?DOB: [birth year] ?Today's Date: 09/06/2021 ? ? ?History of Present Illness Pt is a 81 y.o. female with medical history significant for DM, HTN, HFrEF, depression, chronic anemia, CAD, COPD, L hemiparesis from old stroke and known AAA and on plavix. Pt was recently hospitalized from 3/26 to 3/30 with sepsis 2/2 UTI and left leg cellulitis and discharged to SNF from which she was discharged home on 4/11, who presents to the ED on 4/14 with a fall and right hip pain as well as generalized weakness and constipation. Workup showed T12 compression fracture. ? ?  ?PT Comments  ? ? Patient alert, agreeable to PT, reported 9/10 R sided rib pain. She needed more physical assistance today, reported more pain. Log roll technique to get in/out of bed, modA. Sit <> stand modA with RW as well, and pt ambulated ~15ft, fatigued quickly. Returned to supine, but agreeable to transfer to chair for lunch after pain medication administration. The patient would benefit from further skilled PT intervention to continue to progress towards goals. Recommendation remains appropriate.  ? ? ?   ?Recommendations for follow up therapy are one component of a multi-disciplinary discharge planning process, led by the attending physician.  Recommendations may be updated based on patient status, additional functional criteria and insurance authorization. ? ?Follow Up Recommendations ? Skilled nursing-short term rehab (<3 hours/day) ?  ?  ?Assistance Recommended at Discharge Intermittent Supervision/Assistance  ?Patient can return home with the following A little help with walking and/or transfers;A little help with bathing/dressing/bathroom;Assistance with cooking/housework;Assist for transportation;Help with stairs or ramp for entrance ?  ?Equipment Recommendations ? None recommended by PT  ?  ?Recommendations for Other Services   ? ? ?  ?Precautions / Restrictions  Precautions ?Precautions: Fall ?Required Braces or Orthoses: Spinal Brace ?Spinal Brace: Other (comment) ?Spinal Brace Comments: LSO per neurosurgeon okay due to pt's poor tolerance to TLSO ?Restrictions ?Weight Bearing Restrictions: No  ?  ? ?Mobility ? Bed Mobility ?Overal bed mobility: Needs Assistance ?Bed Mobility: Sit to Sidelying, Rolling ?Rolling: Min guard ?Sidelying to sit: Mod assist ?  ?  ?Sit to sidelying: Mod assist ?General bed mobility comments: instructed in log roll requiring LE assist ?  ? ?Transfers ?Overall transfer level: Needs assistance ?Equipment used: Rolling walker (2 wheels) ?Transfers: Sit to/from Stand ?Sit to Stand: Mod assist ?  ?  ?  ?  ?  ?General transfer comment: VC for feet and hand placement ?  ? ?Ambulation/Gait ?Ambulation/Gait assistance: Min guard ?Gait Distance (Feet): 6 Feet ?Assistive device: Rolling walker (2 wheels) ?Gait Pattern/deviations: Step-through pattern, Trunk flexed ?  ?  ?  ?General Gait Details: very limited due to pain ? ? ?Stairs ?  ?  ?  ?  ?  ? ? ?Wheelchair Mobility ?  ? ?Modified Rankin (Stroke Patients Only) ?  ? ? ?  ?Balance Overall balance assessment: Needs assistance, History of Falls ?Sitting-balance support: Single extremity supported, Feet supported ?Sitting balance-Leahy Scale: Fair ?  ?  ?Standing balance support: Reliant on assistive device for balance, During functional activity, Bilateral upper extremity supported ?Standing balance-Leahy Scale: Poor ?Standing balance comment: no true LOB, but pt fatigued ?  ?  ?  ?  ?  ?  ?  ?  ?  ?  ?  ?  ? ?  ?Cognition Arousal/Alertness: Awake/alert ?Behavior During Therapy: [facility] for tasks assessed/performed ?Overall Cognitive Status: Within Functional Limits for tasks assessed ?  ?  ?  ?  ?  ?  ?  ?  ?  ?  ?  ?  ?  ?  ?  ?  ?  General Comments: patient is able to follow single step commands without difficulty ?  ?  ? ?  ?Exercises   ? ?  ?General Comments   ?  ?  ? ?Pertinent Vitals/Pain Pain  Assessment ?Pain Assessment: 0-10 ?Pain Score: 9  ?Pain Location: R sided rib pain ?Pain Descriptors / Indicators: Aching, Sore ?Pain Intervention(s): Limited activity within patient's tolerance, Repositioned, Monitored during session, RN gave pain meds during session, Patient requesting pain meds-RN notified  ? ? ?Home Living   ?  ?  ?  ?  ?  ?  ?  ?  ?  ?   ?  ?Prior Function    ?  ?  ?   ? ?PT Goals (current goals can now be found in the care plan section) Progress towards PT goals: Progressing toward goals ? ?  ?Frequency ? ? ? 7X/week ? ? ? ?  ?PT Plan Current plan remains appropriate  ? ? ?Co-evaluation   ?  ?  ?  ?  ? ?  ?AM-PAC PT "6 Clicks" Mobility   ?Outcome Measure ? Help needed turning from your back to your side while in a flat bed without using bedrails?: A Lot ?Help needed moving from lying on your back to sitting on the side of a flat bed without using bedrails?: A Lot ?Help needed moving to and from a bed to a chair (including a wheelchair)?: A Little ?Help needed standing up from a chair using your arms (e.g., wheelchair or bedside chair)?: A Lot ?Help needed to walk in hospital room?: A Little ?Help needed climbing 3-5 steps with a railing? : Total ?6 Click Score: 13 ? ?  ?End of Session   ?Activity Tolerance: Patient tolerated treatment well ?Patient left: in bed;with call bell/phone within reach;with bed alarm set ?Nurse Communication: Mobility status ?PT Visit Diagnosis: Unsteadiness on feet (R26.81);History of falling (Z91.81);Muscle weakness (generalized) (M62.81);Other abnormalities of gait and mobility (R26.89) ?  ? ? ?Time: 7253-6644 ?PT Time Calculation (min) (ACUTE ONLY): 19 min ? ?Charges:  $Therapeutic Activity: 8-22 mins          ?          ? ?Lieutenant Diego PT, DPT ?10:01 AM,09/06/21 ? ? ?

## 2021-09-06 NOTE — Discharge Summary (Addendum)
? ?Physician Discharge Summary ? ? ?Marissa Fletcher  female DOB: 29-Apr-1941  ?FUX:[id number] ? ?PCP: Juluis Pitch, MD ? ?Admit date: 09/02/2021 ?Discharge date: 09/06/2021 ? ?Admitted From: home ?Disposition:  SNF ?CODE STATUS: Full code ? ?Hospital Course:  ?For full details, please see H&P, progress notes, consult notes and ancillary notes.  ?Briefly,  ?Marissa Fletcher is a 81 y.o. female with medical history significant for DM, HTN, HFrEF, depression, chronic anemia, CAD, COPD, hemiparesis from old stroke and known AAA, recently hospitalized from 3/26 to 3/30 with sepsis secondary to UTI and left leg cellulitis associated with electrolyte disturbance, and discharged to SNF from which she was discharged home on 4/11, who presented to the ED with a fall and right hip pain as well as generalized weakness and constipation. ? ?Patient reports that since discharge from SNF she has been progressively weaker thus leading to the fall.  She was ambulant with an assistive device while in rehab. ? ?* T12 compression fracture, traumatic, initial encounter ([place]) ?Acute as seen on CT, New wedge deformity of the T12 vertebral body, with approximately ?40% anterior height loss. ?--Neurosurgery consulted, no surgical intervention ?--MRI thoracic showed Small paravertebral hematoma. No epidural hematoma. ?--TLSO when out of bed ?  ?Fall at home, initial encounter ?Generalized weakness ?Likely acute on chronic secondary to general physical deconditioning, recent acute illness and recent fall ?--PT/OT rec SNF rehab ?  ?Constipation ?CT abdomen showed moderate fecal retention throughout the colon consistent with constipation ?--pt reported constipation resolved ?--Miralax PRN ?  ?Iron deficiency anemia ?Hemoglobin stable at 10.5 ?--cont iron supplement ?  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  ?COPD (chronic  obstructive pulmonary disease) ([place]) ?Not currently acutely exacerbated ?  ?Coronary artery disease involving native coronary artery of native heart without angina pectoris ?No complaints of chest pain ?Continue aspirin, plavix and atorvastatin ?  ?Chronic HFrEF (heart failure with reduced ejection fraction) ([place]) ?Appears euvolemic ?--LVEF 35 to 40% with grade II DD. ?Continue carvedilol and furosemide ?  ?AAA (abdominal aortic aneurysm) without rupture ([place]) ?No acute issues suspected at this time ?CT showed stable 4 cm descending thoracic aortic aneurysm and 3.3 cm ?infrarenal abdominal aortic aneurysm. Recommend follow-up every 3 years. ?  ?Essential hypertension ?Pt was not taking Losartan PTA. ?Continue carvedilol and lasix ?  ?Diabetes mellitus without complication ([place]) ?Well controlled.  recent A1c 6.2.  BG within inpatient goal. ?--discharged back on home metformin ? ? ?Discharge Diagnoses:  ?Principal Problem: ?  T12 compression fracture, initial encounter ([place]) ?Active Problems: ?  Generalized weakness ?  Fall at home, initial encounter ?  Constipation ?  Iron deficiency anemia ?  Diabetes mellitus without complication ([place]) ?  Essential hypertension ?  Depression with anxiety ?  AAA (abdominal aortic aneurysm) without rupture ([place]) ?  Chronic HFrEF (heart failure with reduced ejection fraction) ([place]) ?  Coronary artery disease involving native coronary artery of native heart without angina pectoris ?  COPD (chronic obstructive pulmonary disease) ([place]) ?  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 ? ?30 Day Unplanned Readmission Risk Score   ? ?Flowsheet Row ED to Hosp-Admission (Current) from 09/02/2021 in [facility] (1A)  ?30 Day Unplanned Readmission Risk Score (%) 28.98 Filed at 09/06/2021 0801  ? ?  ? ? This score is the patient's risk of an unplanned readmission within 30 days of  being discharged (0 -100%). The score is  based on dignosis, age, lab data, medications, orders, and past utilization.   ?Low:  0-14.9   Medium: 15-21.9   High: 22-29.9   Extreme: 30 and above ? ?  ? ?  ? ? ?Discharge Instructions: ? ?Allergies as of 09/06/2021   ? ?   Reactions  ? Sulfa Antibiotics Swelling  ? ?  ? ?  ?Medication List  ?  ? ?STOP taking these medications   ? ?losartan 25 MG tablet ?Commonly known as: COZAAR ?  ?mupirocin ointment 2 % ?Commonly known as: BACTROBAN ?  ? ?  ? ?TAKE these medications   ? ?acetaminophen 500 MG tablet ?Commonly known as: TYLENOL ?Take 2 tablets (1,000 mg total) by mouth 3 (three) times daily as needed for mild pain, moderate pain or headache. ?  ?albuterol 108 (90 Base) MCG/ACT inhaler ?Commonly known as: VENTOLIN HFA ?Inhale 2 puffs into the lungs every 6 (six) hours as needed for wheezing or shortness of breath. ?  ?ALPRAZolam 0.25 MG tablet ?Commonly known as: Duanne Moron ?Take 1 tablet (0.25 mg total) by mouth daily as needed for anxiety. ?  ?aspirin 81 MG EC tablet ?Take 1 tablet (81 mg total) by mouth daily. Swallow whole. ?  ?atorvastatin 20 MG tablet ?Commonly known as: LIPITOR ?Take 20 mg by mouth every Monday, Wednesday, and Friday. ?  ?carvedilol 6.25 MG tablet ?Commonly known as: COREG ?Take 1 tablet (6.25 mg total) by mouth 2 (two) times daily with a meal. ?  ?clopidogrel 75 MG tablet ?Commonly known as: PLAVIX ?Take 1 tablet (75 mg total) by mouth daily. ?  ?doxepin 25 MG capsule ?Commonly known as: SINEQUAN ?Take 50-75 mg by mouth at bedtime. ?  ?feeding supplement Liqd ?Take 237 mLs by mouth 2 (two) times daily between meals. ?  ?ferrous sulfate 325 (65 FE) MG tablet ?Take 1 tablet (325 mg total) by mouth daily. ?  ?furosemide 20 MG tablet ?Commonly known as: LASIX ?Take 1 tablet (20 mg total) by mouth daily. ?  ?gabapentin 300 MG capsule ?Commonly known as: NEURONTIN ?Take 300 mg by mouth at bedtime. ?  ?HYDROcodone-acetaminophen 5-325 MG tablet ?Commonly known as: NORCO/VICODIN ?Take 1 tablet by mouth  every 6 (six) hours as needed for up to 5 days for moderate pain or severe pain. ?  ?lidocaine 5 % ?Commonly known as: LIDODERM ?Place 2 patches onto the skin daily. Remove & Discard patch within 12 hours or as directed by MD to areas of pain ?What changed: how much to take ?  ?magnesium oxide 400 (240 Mg) MG tablet ?Commonly known as: MAG-OX ?Take 1 tablet (400 mg total) by mouth daily. ?  ?metFORMIN 500 MG 24 hr tablet ?Commonly known as: GLUCOPHAGE-XR ?Take 500 mg by mouth 2 (two) times daily. ?  ?nicotine 21 mg/24hr patch ?Commonly known as: NICODERM CQ - dosed in mg/24 hours ?Place 1 patch (21 mg total) onto the skin daily. ?  ?PARoxetine 30 MG tablet ?Commonly known as: PAXIL ?Take 30 mg by mouth every morning. ?  ?polyethylene glycol 17 g packet ?Commonly known as: MIRALAX / GLYCOLAX ?Take 17 g by mouth daily as needed for moderate constipation. ?  ? ?  ? ? ? Contact information for follow-up providers   ? ? Juluis Pitch, MD Follow up.   ?Specialty: Family Medicine ?Contact information: ?[address] ?[place] [address] ?[phone] ? ? ?  ?  ? ?  ?  ? ? Contact information  for after-discharge care   ? ? Destination   ? ? [facility] Preferred [facility] .   ?Service: Skilled Nursing ?Contact information: ?[address] ?[place] ?[phone] ? ?  ?  ? ?  ?  ? ?  ?  ? ?  ? ? ?Allergies  ?Allergen Reactions  ? Sulfa Antibiotics Swelling  ? ? ? ?The results of significant diagnostics from this hospitalization (including imaging, microbiology, ancillary and laboratory) are listed below for reference.  ? ?Consultations: ? ? ?Procedures/Studies: ?DG Chest 2 View ? ?Result Date: 08/17/2021 ?CLINICAL DATA:  Wheezing EXAM: CHEST - 2 VIEW COMPARISON:  08/14/2021 FINDINGS: Cardiomegaly. Aortic atherosclerosis. Blunting of the posterior costophrenic angles left more than right consistent with small effusions.  Slightly worsened volume loss/infiltrate in the left lower lobe. Chronic interstitial lung markings seen elsewhere diffusely. Old augmented thoracic compression fractures. Few other un augmented regional fractures.

## 2021-09-06 NOTE — Plan of Care (Signed)

## 2021-09-06 NOTE — Progress Notes (Signed)
[place], Report given to [place], [place] ?

## 2021-09-06 NOTE — TOC Progression Note (Signed)
Transition of Care (TOC) - Progression Note  ? ? ?Patient Details  ?Name: Marissa Fletcher ?MRN: [id number] ?Date of Birth: 1940/11/22 ? ?Transition of Care (TOC) CM/SW Contact  ?Conception Oms, RN ?Phone Number: ?09/06/2021, 1:53 PM ? ?Clinical Narrative:    ?Patient going to [facility] called to transport Patient to make family aware ? ? ?Expected Discharge Plan: [place] ?Barriers to Discharge: Continued Medical Work up ? ?Expected Discharge Plan and Services ?Expected Discharge Plan: [place] ?  ?  ?  ?Living arrangements for the past 2 months: [place] ?Expected Discharge Date: 09/06/21               ?  ?  ?  ?  ?  ?  ?  ?  ?  ?  ? ? ?Social Determinants of Health (SDOH) Interventions ?  ? ?Readmission Risk Interventions ? ?  09/04/2021  ?  2:12 PM 08/15/2021  ?  3:40 PM  ?Readmission Risk Prevention Plan  ?Transportation Screening Complete Complete  ?PCP or Specialist Appt within 3-5 Days Complete Complete  ?[place] or Home Care Consult Complete Complete  ?Social Work Consult for [place] Planning/Counseling Complete   ?Palliative Care Screening Not Applicable Not Applicable  ?Medication Review Press photographer) Complete Referral to Pharmacy  ? ? ?

## 2021-09-12 ENCOUNTER — Telehealth: Payer: Self-pay | Admitting: Oncology

## 2021-09-12 NOTE — Telephone Encounter (Signed)
Pt sister Carlyle Basques called to cancel appt, pt is at [facility], will call to r/s .Cherylann Banas   ?

## 2021-09-15 ENCOUNTER — Inpatient Hospital Stay: Payer: Medicare Other | Admitting: Oncology

## 2021-09-22 ENCOUNTER — Ambulatory Visit: Payer: Medicare Other | Admitting: Medical

## 2021-10-04 ENCOUNTER — Other Ambulatory Visit: Payer: Self-pay | Admitting: Internal Medicine

## 2021-10-08 ENCOUNTER — Other Ambulatory Visit: Payer: Self-pay | Admitting: Internal Medicine

## 2021-10-10 NOTE — Telephone Encounter (Signed)
Please contact pt for future appointment. Pt overdue for 1 month f/u. Pt needing refills.

## 2021-10-19 ENCOUNTER — Ambulatory Visit (INDEPENDENT_AMBULATORY_CARE_PROVIDER_SITE_OTHER): Payer: Medicare Other | Admitting: Medical

## 2021-10-19 ENCOUNTER — Encounter: Payer: Self-pay | Admitting: Medical

## 2021-10-19 VITALS — BP 132/82 | HR 85 | Ht 62.0 in | Wt 124.4 lb

## 2021-10-19 DIAGNOSIS — E1169 Type 2 diabetes mellitus with other specified complication: Secondary | ICD-10-CM | POA: Diagnosis not present

## 2021-10-19 DIAGNOSIS — I5022 Chronic systolic (congestive) heart failure: Secondary | ICD-10-CM

## 2021-10-19 DIAGNOSIS — Z79899 Other long term (current) drug therapy: Secondary | ICD-10-CM

## 2021-10-19 DIAGNOSIS — E785 Hyperlipidemia, unspecified: Secondary | ICD-10-CM | POA: Diagnosis not present

## 2021-10-19 DIAGNOSIS — Z8673 Personal history of transient ischemic attack (TIA), and cerebral infarction without residual deficits: Secondary | ICD-10-CM

## 2021-10-19 DIAGNOSIS — I2510 Atherosclerotic heart disease of native coronary artery without angina pectoris: Secondary | ICD-10-CM

## 2021-10-19 MED ORDER — DAPAGLIFLOZIN PROPANEDIOL 10 MG PO TABS: 10.0000 mg | ORAL_TABLET | Freq: Every day | ORAL | Status: DC

## 2021-10-19 NOTE — Progress Notes (Unsigned)
Cardiology Office Note:    Date:  10/21/2021   ID:  Marissa Fletcher, DOB 10/23/1940, MRN [id number]  PCP:  Juluis Pitch, MD  [facility] Cardiologist:  Nelva Bush, MD  [facility] Electrophysiologist:  None   Referring MD: Juluis Pitch, MD   Chief Complaint: 1 month follow-up  History of Present Illness:    Marissa Fletcher is a 81 y.o. female with a hx of CVA 04/2021 aortic and carotid atherosclerosis, cardiomyopathy LVEF 35-40% by echo in 04/2021, HTN, HLD, DM2, lung cancer, tobacco abuse and anxiety who presents for 1 month follow-up.   She was admitted for stroke in December 2022.  CTA of the neck showed nonobstructive carotid artery disease and extensive atherosclerosis with mural thrombus involving the aorta.  Echo during that admission revealed LVEF of 35-40% with global hypokinesis and grade 2 diastolic dysfunction.  She was initiated on low-dose goal-directed medical therapy and discharged with ambulatory cardiac monitoring.  TEE was not pursued on the recommendation of neurology.  Monitor showed rare PACs and PVCs as well as 5 brief episodes of PSVT.  No atrial fibrillation/flutter was identified. She was seen in follow-up and a coronary CTA was ordered to assess for underlying ischemic heart disease; this demonstrated severe single-vessel coronary artery disease with a greater than 70% stenosis in the mid RCA that was significant by CT.   Last seen 07/13/21 and reported improved lower leg edema. For low EF it was felt single vessel CAD did not entirely explain reduced EF, did not feel she was a good cath candidate given high risk for embolic phenomenon, therefore plan to continue aggressive medical therapy.   Hospitalized in April 2023 for a fall and sepsis. Fall was felt to be from general deconditioning and weakness. She sustained back fracture and was treated medically.   Today, the patient is doing well at home. She lives by herself and uses a Corporate investment banker. She is  still doing PT. She is still scared to fall, so she doesn't like to walk around without someone there. She has help come to her house. She is eating and drinking normally. She has stable LLE. She denies chest pain, shortness of breath, orthopnea or pnd. She has a prescription for Wilder Glade, but is afraid to take it due to possible side effects. She takes lipitor three times weekly.    Past Medical History:  Diagnosis Date   Anxiety    Aortic atherosclerosis ([place])    Arthritis    Cardiomyopathy ([place])    a. 04/2021 Echo: EF 35-40%, glob HK. GrII DD.  Nl RV size/fxn. Mod dil LA. Mild MR.   Carotid arterial disease ([place])    a. 04/2021 CTA Head/neck: RICA 60, LICA 55.   COPD (chronic obstructive pulmonary disease) (HCC)    Depression    Diabetes mellitus without complication ([place])    History of kidney stones    Hypertension    Lung cancer ([place])    Stroke (cerebrum) ([place])    a. 04/2021 MRI brain: Acute infarct post limb of R internal capsule.   Thoracic aortic aneurysm (HCC)    Tobacco abuse     Past Surgical History:  Procedure Laterality Date   ABDOMINAL HYSTERECTOMY     CHOLECYSTECTOMY     EYE SURGERY Bilateral    KYPHOPLASTY N/A 07/30/2020   Procedure: T10 Kyphoplasty;  Surgeon: Hessie Knows, MD;  Location: [facility] ORS;  Service: Orthopedics;  Laterality: N/A;  T10   KYPHOPLASTY N/A 08/19/2020   Procedure: T9 YPHOPLASTY;  Surgeon: Hessie Knows, MD;  Location: [facility] ORS;  Service: Orthopedics;  Laterality: N/A;    Current Medications: Current Meds  Medication Sig   acetaminophen (TYLENOL) 500 MG tablet Take 2 tablets (1,000 mg total) by mouth 3 (three) times daily as needed for mild pain, moderate pain or headache.   ALPRAZolam (XANAX) 0.25 MG tablet Take 1 tablet (0.25 mg total) by mouth daily as needed for anxiety.   aspirin EC 81 MG EC tablet Take 1 tablet (81 mg total) by mouth daily. Swallow whole.   atorvastatin (LIPITOR) 20 MG tablet Take 20 mg by mouth every Monday, Wednesday,  and Friday.   carvedilol (COREG) 6.25 MG tablet TAKE ONE TABLET BY MOUTH TWICE A DAY WITH MEALS   clopidogrel (PLAVIX) 75 MG tablet Take 1 tablet (75 mg total) by mouth daily.   dapagliflozin propanediol (FARXIGA) 10 MG TABS tablet Take 1 tablet (10 mg total) by mouth daily before breakfast.   doxepin (SINEQUAN) 25 MG capsule Take 50-75 mg by mouth at bedtime.   ferrous sulfate 325 (65 FE) MG tablet Take 1 tablet (325 mg total) by mouth daily.   furosemide (LASIX) 20 MG tablet Take 1 tablet (20 mg total) by mouth daily.   gabapentin (NEURONTIN) 300 MG capsule Take 300 mg by mouth at bedtime.   magnesium oxide (MAG-OX) 400 (240 Mg) MG tablet Take 1 tablet (400 mg total) by mouth daily.   metFORMIN (GLUCOPHAGE-XR) 500 MG 24 hr tablet Take 500 mg by mouth 2 (two) times daily.   PARoxetine (PAXIL) 30 MG tablet Take 30 mg by mouth every morning.   polyethylene glycol (MIRALAX / GLYCOLAX) 17 g packet Take 17 g by mouth daily as needed for moderate constipation.     Allergies:   Sulfa antibiotics   Social History   Socioeconomic History   Marital status: Single    Spouse name: Not on file   Number of children: Not on file   Years of education: Not on file   Highest education level: Not on file  Occupational History   Not on file  Tobacco Use   Smoking status: Every Day    Packs/day: 1.00    Years: 68.00    Pack years: 68.00    Types: Cigarettes   Smokeless tobacco: Never  Vaping Use   Vaping Use: Never used  Substance and Sexual Activity   Alcohol use: Never   Drug use: Never   Sexual activity: Not on file  Other Topics Concern   Not on file  Social History Narrative   Lives locally.  Does not routinely exercise.   Social Determinants of Health   Financial Resource Strain: Not on file  Food Insecurity: Not on file  Transportation Needs: Not on file  Physical Activity: Not on file  Stress: Not on file  Social Connections: Not on file     Family History: The patient's  family history includes Bladder Cancer in her father; Congestive Heart Failure in her mother; Dementia in her mother; Heart disease in her father. There is no history of Breast cancer.  ROS:   Please see the history of present illness.     All other systems reviewed and are negative.  EKGs/Labs/Other Studies Reviewed:    The following studies were reviewed today:  Heart monitor 06/2021 The patient was monitored for 11 days 16 hours. Only 5 days, 1 hour could be analyzed due to significant artifact. The predominant rhythm was sinus with an average rate of 85 bpm (  range 66-115 bpm in sinus). There were rare PACs and PVCs. 5 atrial runs lasting up to 11 beats were observed with a maximum rate of 164 bpm. No sustained arrhythmia or prolonged pauses was seen. There was no evidence of atrial fibrillation/flutter. There were no patient triggered events.   Predominantly sinus rhythm with rare PACs and PVCs as well as sporadic brief episodes of PSVT.  Monitoring period limited by significant artifact.  Cardiac CTA 06/2021   1. Left Main:  No significant stenosis.   2. LAD: No significant stenosis.  FFRct 0.86 3. LCX: No significant focal stenosis.  FFRct 0.74 4. RCA: significant stenosis in the mid RCA.  FFRct 0.59   IMPRESSION: 1.  CT FFR analysis showed significant stenosis in the mid RCA.   2.  Recommend cardiac catheterization.    Echo 04/2021  1. Left ventricular ejection fraction, by estimation, is 35 to 40%. The  left ventricle has moderately decreased function. The left ventricle  demonstrates global hypokinesis. There is mild left ventricular  hypertrophy. Left ventricular diastolic  parameters are consistent with Grade II diastolic dysfunction  (pseudonormalization).   2. Right ventricular systolic function is normal. The right ventricular  size is normal.   3. Left atrial size was mild to moderately dilated.   4. The mitral valve is normal in structure. Mild mitral valve   regurgitation.   5. The aortic valve is tricuspid. Aortic valve regurgitation is not  visualized.   6. The inferior vena cava is dilated in size with <50% respiratory  variability, suggesting right atrial pressure of 15 mmHg.   EKG:  EKG is  ordered today.  The ekg ordered today demonstrates NSR 1st degree AV block, Lad, TWI aVL, LVH  Recent Labs: 08/14/2021: B Natriuretic Peptide 361.1 09/02/2021: ALT 21; TSH 0.608 09/05/2021: BUN 9; Creatinine, Ser 0.60; Hemoglobin 9.9; Magnesium 1.9; Platelets 444; Potassium 4.4; Sodium 138  Recent Lipid Panel    Component Value Date/Time   CHOL 147 05/17/2021 0956   TRIG 65 05/17/2021 0956   HDL 38 (L) 05/17/2021 0956   CHOLHDL 3.9 05/17/2021 0956   VLDL 13 05/17/2021 0956   LDLCALC 96 05/17/2021 0956      Physical Exam:    VS:  BP 132/82 (BP Location: Left Arm, Patient Position: Sitting, Cuff Size: Normal)   Pulse 85   Ht 5\' 2"  (1.575 m)   Wt 124 lb 6 oz (56.4 kg)   SpO2 96%   BMI 22.75 kg/m     Wt Readings from Last 3 Encounters:  10/19/21 124 lb 6 oz (56.4 kg)  09/02/21 124 lb (56.2 kg)  08/18/21 135 lb 5.8 oz (61.4 kg)     GEN:  Well nourished, well developed in no acute distress HEENT: Normal NECK: No JVD; No carotid bruits LYMPHATICS: No lymphadenopathy CARDIAC: RRR, no murmurs, rubs, gallops RESPIRATORY:  Clear to auscultation without rales, wheezing or rhonchi  ABDOMEN: Soft, non-tender, non-distended MUSCULOSKELETAL:  No edema; No deformity  SKIN: Warm and dry NEUROLOGIC:  Alert and oriented x 3 PSYCHIATRIC:  Normal affect   ASSESSMENT:    1. Chronic HFrEF (heart failure with reduced ejection fraction) ([place])   2. Coronary artery disease involving native coronary artery of native heart without angina pectoris   3. Hyperlipidemia associated with type 2 diabetes mellitus ([place])   4. Medication management   5. History of stroke    PLAN:    In order of problems listed above:  HFrEF Echo in December showed LVEF  35-40%. She has CAD, but suspected not fully ischemic cardiomyopathy. She is euvolemic on exam today with lasix 20mg  daily. She was started on Farxiga months ago, but has still not started it. I encouraged her to start this and she said she will, BMET in 2 weeks. Continue BB therapy. We will continue GDMT at follow-up.   CAD Significant 2V disease on Cardiac CT 06/2021, did not feel she was a good cath candidate due to high risk for periproct stroke or other embolic episode during catheterization; recommended medical therapy. The patient denies anginal symptoms. No plan for ischemic work-up. Continue Aspirin, Plavix, Coreg, and Lipitor.   HLD LDL 96 last year. She has been on Lipitor 20mg  three times weekly. We will repeat lipid panel.   H/o stroke No new neurologic deficits. Continue Aspirin and Plavix. Continue statin therapy.     Disposition: Follow up in 1 month(s) with MD/APP     Signed, Welda Azzarello Ninfa Meeker, PA-C  10/21/2021 1:02 PM    [facility]

## 2021-10-19 NOTE — Patient Instructions (Signed)
Medication Instructions:  - Your physician has recommended you make the following change in your medication:   1) START Farxiga 10 mg: - take 1 tablet by mouth ONCE daily    *If you need a refill on your cardiac medications before your next appointment, please call your pharmacy*   Lab Work: - Your physician recommends that you return for FASTING lab work in: 2 weeks (around 11/02/21)   BMP/ Lipid panel (do not eat/ drink anything for 8 hours prior to your lab draw except for water or black coffee)  [facility] Mall Entrance at [facility] 1st desk on the right to check in (REGISTRATION)  Lab hours: Monday- Friday (7:30 am- 5:30 pm)   If you have labs (blood work) drawn today and your tests are completely normal, you will receive your results only by: MyChart Message (if you have MyChart) OR A paper copy in the mail If you have any lab test that is abnormal or we need to change your treatment, we will call you to review the results.   Testing/Procedures: - none ordered   Follow-Up: At [facility], you and your health needs are our priority.  As part of our continuing mission to provide you with exceptional heart care, we have created designated Provider Care Teams.  These Care Teams include your primary Cardiologist (physician) and Advanced Practice Providers (APPs -  Physician Assistants and Nurse Practitioners) who all work together to provide you with the care you need, when you need it.  We recommend signing up for the patient portal called "MyChart".  Sign up information is provided on this After Visit Summary.  MyChart is used to connect with patients for Virtual Visits (Telemedicine).  Patients are able to view lab/test results, encounter notes, upcoming appointments, etc.  Non-urgent messages can be sent to your provider as well.   To learn more about what you can do with MyChart, go to [email or web address].    Your next appointment:   1 month(s)  The format for your next  appointment:   In Person  Provider:   You may see Nelva Bush, MD or one of the following Advanced Practice Providers on your designated Care Team:    Cadence Kathlen Mody, [place]    Other Instructions N/a  Important Information About Sugar

## 2021-11-02 ENCOUNTER — Other Ambulatory Visit: Payer: Self-pay | Admitting: Family Medicine

## 2021-11-02 ENCOUNTER — Other Ambulatory Visit: Payer: Self-pay | Admitting: Neurosurgery

## 2021-11-02 ENCOUNTER — Other Ambulatory Visit: Payer: Self-pay | Admitting: Internal Medicine

## 2021-11-02 DIAGNOSIS — S22080A Wedge compression fracture of T11-T12 vertebra, initial encounter for closed fracture: Secondary | ICD-10-CM

## 2021-11-15 ENCOUNTER — Other Ambulatory Visit
Admission: RE | Admit: 2021-11-15 | Discharge: 2021-11-15 | Disposition: A | Discharge status: Home / Self Care | Payer: Medicare Other | Attending: Medical | Admitting: Medical

## 2021-11-15 DIAGNOSIS — I2510 Atherosclerotic heart disease of native coronary artery without angina pectoris: Secondary | ICD-10-CM | POA: Insufficient documentation

## 2021-11-15 DIAGNOSIS — Z79899 Other long term (current) drug therapy: Secondary | ICD-10-CM | POA: Diagnosis present

## 2021-11-15 DIAGNOSIS — I5022 Chronic systolic (congestive) heart failure: Secondary | ICD-10-CM | POA: Diagnosis present

## 2021-11-15 DIAGNOSIS — E785 Hyperlipidemia, unspecified: Secondary | ICD-10-CM | POA: Insufficient documentation

## 2021-11-15 DIAGNOSIS — E1169 Type 2 diabetes mellitus with other specified complication: Secondary | ICD-10-CM | POA: Diagnosis present

## 2021-11-15 LAB — BASIC METABOLIC PANEL
Anion gap: 8 (ref 5–15)
BUN: 13 mg/dL (ref 8–23)
CO2: 27 mmol/L (ref 22–32)
Calcium: 9.2 mg/dL (ref 8.9–10.3)
Chloride: 105 mmol/L (ref 98–111)
Creatinine, Ser: 0.62 mg/dL (ref 0.44–1.00)
GFR, Estimated: >60 mL/min (ref >60)
Glucose, Bld: 100 mg/dL — ABNORMAL HIGH (ref 70–99)
Potassium: 4.2 mmol/L (ref 3.5–5.1)
Sodium: 140 mmol/L (ref 135–145)

## 2021-11-15 LAB — LIPID PANEL
Cholesterol: 153 mg/dL (ref 0–200)
HDL: 49 mg/dL (ref >40)
LDL Cholesterol: 87 mg/dL (ref 0–99)
Total CHOL/HDL Ratio: 3.1 RATIO
Triglycerides: 84 mg/dL (ref <150)
VLDL: 17 mg/dL (ref 0–40)

## 2021-11-16 ENCOUNTER — Telehealth: Payer: Self-pay | Admitting: Internal Medicine

## 2021-11-16 NOTE — Telephone Encounter (Signed)
   Name: Marissa Fletcher  DOB: 02/01/1941  MRN: [id number]   Primary Cardiologist: Nelva Bush, MD  Chart reviewed as part of pre-operative protocol coverage. Marissa Fletcher was last seen on 10/19/21 by Cadence Furth PAC.  She suffered a CVA 04/2021 and is on Plavix per neurology.  As part of that work-up she was found to have a cardiomyopathy with an LVEF of 35 to 40%.  GDMT was attempted.  She also underwent CT coronary given new cardiomyopathy which showed severe one-vessel disease that was significant by FFR.  She was felt to be a poor candidate for definitive angiography and has been treated medically.  She would be high risk for procedures requiring anesthesia.  Please reach out to neurology/PCP for Plavix hold as this is for stroke.  I will route this recommendation to the requesting party via Epic fax function and remove from pre-op pool. Please call with questions.  Ledora Bottcher, PA 11/16/2021, 9:57 AM

## 2021-11-16 NOTE — Telephone Encounter (Signed)
   Pre-operative Risk Assessment    Patient Name: Marissa Fletcher  DOB: [birth year] MRN: [id number]      Request for Surgical Clearance    Procedure:   Kyphoplasty   Date of Surgery:  Clearance TBD                                 Surgeon:  Dr. Darcus Austin Group or Practice Name:  [facility] Phone number:  [phone] Fax number:  [phone]   Type of Clearance Requested:   - Pharmacy:  Hold Clopidogrel (Plavix) 5   Type of Anesthesia:  None    Additional requests/questions:      Eston Mould   11/16/2021, 8:24 AM

## 2021-11-17 ENCOUNTER — Ambulatory Visit
Admission: RE | Admit: 2021-11-17 | Discharge: 2021-11-17 | Disposition: A | Discharge status: Home / Self Care | Payer: Medicare Other | Source: Ambulatory Visit | Attending: Neurosurgery | Admitting: Neurosurgery

## 2021-11-17 DIAGNOSIS — S22080A Wedge compression fracture of T11-T12 vertebra, initial encounter for closed fracture: Secondary | ICD-10-CM

## 2021-11-17 NOTE — H&P (Signed)
Interventional Radiology - Clinic Visit, Initial H&P    Referring Provider: Loleta Dicker, PA  Reason for Visit: T12 compression fracture     History of Present Illness  Marissa Fletcher is a 81 y.o. female with a relevant past medical history of recent stroke in Dec 2012 on asp + Plavix, heart disease with recent echo in 2022 showing 35-40% EF, COPD not on home oxygen, lung cancer s/p radiation therapy, and osteoporosis with previous compression fracture at T9, T10 treated with [place] in 2022 seen today in Interventional Radiology clinic for new T12 compression fracture.  Patient reported that she fell in early April 2023 which resulted in significant back pain.  MRI on April 15 demonstrated acute compression fracture of the T12 vertebral body.  Since April, the pain has been treated with prescription narcotic pain medication (oxycodone) with minimal improvement in her pain, which she rates at 6-7/10, with the pain sometimes being 16/10 with certain movement.  Additionally, the patient has been undergoing physical therapy, partly due to a stroke that she suffered in December 2022, without significant improvement in her pain.  Additionally, she scores significant instability on the [place] disability questionnaire with 15/24 positive.    Of note, she is had previous kyphoplasty in 2022 at T9 and T10.    Patient reports that she has osteoporosis.  Additional review of systems positive for recent stroke in December 2022, heart failure with ejection fraction of 35-40%, lung cancer recently treated in 2022 with completion of radiation therapy, and COPD.  Patient does not complain of active chest pain or shortness of breath.  She is on dual anti-platelet therapy with Plavix and aspirin.     Additional Past Medical History Past Medical History:  Diagnosis Date   Anxiety    Aortic atherosclerosis ([place])    Arthritis    Cardiomyopathy ([place])    a. 04/2021 Echo: EF 35-40%, glob HK. GrII DD.  Nl  RV size/fxn. Mod dil LA. Mild MR.   Carotid arterial disease ([place])    a. 04/2021 CTA Head/neck: RICA 60, LICA 55.   COPD (chronic obstructive pulmonary disease) (HCC)    Depression    Diabetes mellitus without complication ([place])    History of kidney stones    Hypertension    Lung cancer ([place])    Stroke (cerebrum) ([place])    a. 04/2021 MRI brain: Acute infarct post limb of R internal capsule.   Thoracic aortic aneurysm ([place])    Tobacco abuse      Surgical History  Past Surgical History:  Procedure Laterality Date   ABDOMINAL HYSTERECTOMY     CHOLECYSTECTOMY     EYE SURGERY Bilateral    KYPHOPLASTY N/A 07/30/2020   Procedure: T10 Kyphoplasty;  Surgeon: Hessie Knows, MD;  Location: [facility] ORS;  Service: Orthopedics;  Laterality: N/A;  T10   KYPHOPLASTY N/A 08/19/2020   Procedure: T9 YPHOPLASTY;  Surgeon: Hessie Knows, MD;  Location: [facility] ORS;  Service: Orthopedics;  Laterality: N/A;     Medications  I have reviewed the current medication list. Refer to chart for details. Current Outpatient Medications  Medication Instructions   acetaminophen (TYLENOL) 1,000 mg, Oral, 3 times daily PRN   albuterol (VENTOLIN HFA) 108 (90 Base) MCG/ACT inhaler 2 puffs, Inhalation, Every 6 hours PRN   ALPRAZolam (XANAX) 0.25 mg, Oral, Daily PRN   aspirin EC 81 mg, Oral, Daily, Swallow whole.   atorvastatin (LIPITOR) 20 mg, Oral, Every M-W-F   carvedilol (COREG) 6.25 MG tablet TAKE 1  TABLET BY MOUTH TWICE A DAY WITH MEALS   clopidogrel (PLAVIX) 75 mg, Oral, Daily   dapagliflozin propanediol (FARXIGA) 10 mg, Oral, Daily before breakfast   doxepin (SINEQUAN) 50-75 mg, Oral, Daily at bedtime   feeding supplement (ENSURE ENLIVE / ENSURE PLUS) LIQD 237 mLs, Oral, 2 times daily between meals   ferrous sulfate 325 mg, Oral, Daily   furosemide (LASIX) 20 mg, Oral, Daily   gabapentin (NEURONTIN) 300 mg, Oral, Nightly   lidocaine (LIDODERM) 5 % 2 patches, Transdermal, Every 24 hours, Remove & Discard patch  within 12 hours or as directed by MD to areas of pain   magnesium oxide (MAG-OX) 400 mg, Oral, Daily   metFORMIN (GLUCOPHAGE-XR) 500 mg, Oral, 2 times daily   nicotine (NICODERM CQ - DOSED IN MG/24 HOURS) 21 mg, Transdermal, Daily   oxyCODONE-acetaminophen (PERCOCET/ROXICET) 5-325 MG tablet Oral   PARoxetine (PAXIL) 30 mg, Oral, BH-each morning   polyethylene glycol (MIRALAX / GLYCOLAX) 17 g, Oral, Daily PRN      Allergies Allergies  Allergen Reactions   Sulfa Antibiotics Swelling   Does patient have contrast allergy: No     Physical Exam Current Vitals Temp: 97.8 F (36.6 C) (Temp Source: Oral)  Pulse Rate: 81  Resp: 16  BP: 127/62  SpO2: 94 %     Weight: 56.2 kg  Body mass index is 22.68 kg/m.  General: Alert and answers questions appropriately. No apparent distress. HEENT: Normocephalic, atraumatic. Conjunctivae normal without scleral icterus. Cardiac: Regular rate.  Pulmonary: Normal work of breathing. On room air. Abdominal: Soft without distension. MSK: Point tenderness of the mid to lower back at expected location of T12    Pertinent Lab Results    Latest Ref Rng & Units 09/05/2021    3:19 AM 09/04/2021    5:11 AM 09/03/2021    4:35 AM  CBC  WBC 4.0 - 10.5 K/uL 4.5  5.1  6.0   Hemoglobin 12.0 - 15.0 g/dL 9.9  9.6  9.2   Hematocrit 36.0 - 46.0 % 34.0  31.8  30.0   Platelets 150 - 400 K/uL 444  432  450       Latest Ref Rng & Units 11/15/2021   10:27 AM 09/05/2021    3:19 AM 09/04/2021    5:11 AM  CMP  Glucose 70 - 99 mg/dL 100  99  98   BUN 8 - 23 mg/dL 13  9  10    Creatinine 0.44 - 1.00 mg/dL 0.62  0.60  0.54   Sodium 135 - 145 mmol/L 140  138  137   Potassium 3.5 - 5.1 mmol/L 4.2  4.4  3.0   Chloride 98 - 111 mmol/L 105  102  100   CO2 22 - 32 mmol/L 27  30  29    Calcium 8.9 - 10.3 mg/dL 9.2  8.3  8.0       Relevant and/or Recent Imaging: MRI April 2023 per HPI    Assessment & Plan Marissa Fletcher is a 81 y.o. female with a history of heart  disease, COPD, lung cancer, and recent stroke on DAPT who was referred to [facility] in consultation for further evaluation and management of T12 compression fracture following a fall in the setting of osteoporosis.  Patient has suffered subacute osteoporotic and traumatic of the T12 vertebra.   History and exam have demonstrated the following:  Acute/Subacute fracture by imaging dated April 2023, Pain on exam concordant with level of fracture, Failure of conservative  therapy and pain refractory to narcotic pain mediation, and Significant disability on the [place] with 15/24 positive symptoms, reflecting significant impact/impairment of (ADLs)   ICD-10-CM Codes that Support Medical Necessity ([email or web address].[email or web address]?[email or web address])  M80.08XA    Age-related osteoporosis with current pathological fracture, vertebra(e), initial encounter for fracture  S22.080A    [place] compression fracture of T11-T12 vertebra, initial encounter for closed fracture    Plan:  T12 vertebral body augmentation with balloon kyphoplasty  Post-procedure disposition: outpatient [facility]  Medication holds: Aspirim, Plavix pending clearance with PCP/neuro  The patient has suffered a fracture of the T12 vertebral body. It is recommended that patients aged 89 years or older be evaluated for possible testing or treatment of osteoporosis. A copy of this consult report is sent to the patient's referring physician.  Advanced Care Plan: The patient did not want to provide an [place] at the time of this visit     Total time spent on today's visit was over  60 Minutes, including both face-to-face time and non face-to-face time, personally spent on review of chart (including labs and relevant imaging), discussing further workup and treatment options, referral to specialist if needed, reviewing outside records if pertinent, answering patient  questions, and coordinating care regarding T12 compression fracture as well as management strategy.      Albin Felling, MD  Vascular and Interventional Radiology 11/17/2021 12:04 PM

## 2021-11-18 NOTE — Progress Notes (Addendum)
I spoke with Dr. Rory Percy the last neurologist to see this pt. Since her stroke in December 2022. The pt. Has not seen a neurologist since then. Dr. Rory Percy recommendations were for her to only take the plavix for three months after her stroke and continue asprin after that. His current recommendation would be the same since she should have been off plavix since March. The pt. Currently has referral to see a neurologist but Dr. Rory Percy was okay with her PCP clearing her for an upcoming kyphoplasty since she shouldn't be taking her plavix anyways as long as she follows up with an outpatient neurologist as soon as they are able to get her in.   Thank you to Dr. Rory Percy and the help he has been with this case.

## 2021-11-21 ENCOUNTER — Ambulatory Visit (INDEPENDENT_AMBULATORY_CARE_PROVIDER_SITE_OTHER): Payer: Medicare Other | Admitting: Cardiology

## 2021-11-21 ENCOUNTER — Encounter: Payer: Self-pay | Admitting: Medical

## 2021-11-21 VITALS — BP 146/80 | HR 87 | Ht 60.0 in | Wt 126.0 lb

## 2021-11-21 DIAGNOSIS — E1169 Type 2 diabetes mellitus with other specified complication: Secondary | ICD-10-CM | POA: Diagnosis not present

## 2021-11-21 DIAGNOSIS — Z8673 Personal history of transient ischemic attack (TIA), and cerebral infarction without residual deficits: Secondary | ICD-10-CM

## 2021-11-21 DIAGNOSIS — Z720 Tobacco use: Secondary | ICD-10-CM

## 2021-11-21 DIAGNOSIS — I700 Atherosclerosis of aorta: Secondary | ICD-10-CM | POA: Diagnosis not present

## 2021-11-21 DIAGNOSIS — E785 Hyperlipidemia, unspecified: Secondary | ICD-10-CM

## 2021-11-21 DIAGNOSIS — I2510 Atherosclerotic heart disease of native coronary artery without angina pectoris: Secondary | ICD-10-CM

## 2021-11-21 DIAGNOSIS — I5022 Chronic systolic (congestive) heart failure: Secondary | ICD-10-CM

## 2021-11-21 NOTE — Progress Notes (Signed)
Cardiology Clinic Note   Patient Name: Marissa Fletcher Date of Encounter: 11/21/2021  Primary Care Provider:  Juluis Pitch, MD Primary Cardiologist:  Nelva Bush, MD  Patient Profile    81 year old female with a history of CVA (04/2021), carotid atherosclerosis, cardiomyopathy LVEF 35-40% by echo (04/2021), essential hypertension, HLD, DMII, lung cancer, tobacco abuse and anxiety who presents for follow up of her cardiomyopathy.  Past Medical History    Past Medical History:  Diagnosis Date   Anxiety    Aortic atherosclerosis ([place])    Arthritis    Cardiomyopathy ([place])    a. 04/2021 Echo: EF 35-40%, glob HK. GrII DD.  Nl RV size/fxn. Mod dil LA. Mild MR.   Carotid arterial disease ([place])    a. 04/2021 CTA Head/neck: RICA 60, LICA 55.   COPD (chronic obstructive pulmonary disease) (HCC)    Depression    Diabetes mellitus without complication ([place])    History of kidney stones    Hypertension    Lung cancer ([place])    Stroke (cerebrum) ([place])    a. 04/2021 MRI brain: Acute infarct post limb of R internal capsule.   Thoracic aortic aneurysm (HCC)    Tobacco abuse    Past Surgical History:  Procedure Laterality Date   ABDOMINAL HYSTERECTOMY     CHOLECYSTECTOMY     EYE SURGERY Bilateral    KYPHOPLASTY N/A 07/30/2020   Procedure: T10 Kyphoplasty;  Surgeon: Hessie Knows, MD;  Location: [facility] ORS;  Service: Orthopedics;  Laterality: N/A;  T10   KYPHOPLASTY N/A 08/19/2020   Procedure: T9 YPHOPLASTY;  Surgeon: Hessie Knows, MD;  Location: [facility] ORS;  Service: Orthopedics;  Laterality: N/A;    Allergies  Allergies  Allergen Reactions   Sulfa Antibiotics Swelling   Farxiga [Dapagliflozin] Other (See Comments)    Recurring yeast infection    History of Present Illness    81 year old female with a past medical history of medullary CVA, carotid atherosclerosis, cardiomyopathy with LVEF of 35 to 40%-04/2021, some hypertension, hyperlipidemia, diabetes type 2, lung cancer,  tobacco abuse and anxiety.  She was previously hospitalized in late December 2022 with an acute CVA.  CT of the neck showed nonobstructive carotid artery disease and extensive atherosclerosis with mural thrombus involving the aorta.  Echo during that admission revealed LVEF of 35 to 40% with global hypokinesis and grade 2 diastolic dysfunction.  She was initiated on low-dose goal-directed medical therapy and discharged with ambulatory cardiac monitoring.  TEE was not pursued on the recommendation of neurology.  Monitor showed rare PACs and PVCs as well as a brief episode of PSVT for total of 5 times.  There was no atrial fibrillation/atrial flutter identified.  Lower extremity venous duplex time admission was negative for DVT. She was evaluated in the office on 06/08/2021 and was having issues with affording [place].  She still continued to have some swelling in her bilateral lower extremities and was initiated on furosemide 20 mg daily.  She was continued on her current doses of carvedilol and losartan.  She did undergo coronary CTA on 06/23/2021 to assess for underlying ischemic heart disease that demonstrated severe single-vessel coronary artery disease with a greater than 75% stenosis in the mid RCA that was significant by CT FFR.  She was also approved for pharmacy assistance of her [place] with idea on receiving medication via mail by her last appointment on 07/13/2021.  Unfortunately she was hospitalized in April 2023 for fall and sepsis.  Fall was felt  to be from general deconditioning and weakness.  She sustained a back fracture and was treated medically.  She did continue to have physical therapy at home with some improvement and continue to walk with rollator. She returns for follow-up in clinic today stating that she has been doing fairly well.  She is accompanied by 2 family members today. She does continue to walk with a rollator.  For her back fracture she sustained from her fall previously in the years  she has been holding her Plavix and started holding her aspirin today for kyphoplasty on Wednesday of this week.  She denies any chest pain, palpitations, shortness of breath, but does endorse continued swelling to her bilateral lower extremities that is unchanged from her previous visit.  Home Medications    Current Outpatient Medications  Medication Sig Dispense Refill   acetaminophen (TYLENOL) 500 MG tablet Take 2 tablets (1,000 mg total) by mouth 3 (three) times daily as needed for mild pain, moderate pain or headache. 30 tablet 0   albuterol (VENTOLIN HFA) 108 (90 Base) MCG/ACT inhaler Inhale 2 puffs into the lungs every 6 (six) hours as needed for wheezing or shortness of breath. 8 g 0   ALPRAZolam (XANAX) 0.25 MG tablet Take 1 tablet (0.25 mg total) by mouth daily as needed for anxiety. 10 tablet 0   aspirin EC 81 MG EC tablet Take 1 tablet (81 mg total) by mouth daily. Swallow whole. 30 tablet 11   atorvastatin (LIPITOR) 20 MG tablet Take 20 mg by mouth every Monday, Wednesday, and Friday.     carvedilol (COREG) 6.25 MG tablet TAKE 1 TABLET BY MOUTH TWICE A DAY WITH MEALS 60 tablet 0   clopidogrel (PLAVIX) 75 MG tablet Take 1 tablet (75 mg total) by mouth daily. 30 tablet 12   doxepin (SINEQUAN) 25 MG capsule Take 50-75 mg by mouth at bedtime.     ferrous sulfate 325 (65 FE) MG tablet Take 1 tablet (325 mg total) by mouth daily. 30 tablet 3   furosemide (LASIX) 20 MG tablet Take 1 tablet (20 mg total) by mouth daily. 30 tablet 0   gabapentin (NEURONTIN) 300 MG capsule Take 300 mg by mouth at bedtime.     magnesium oxide (MAG-OX) 400 (240 Mg) MG tablet Take 1 tablet (400 mg total) by mouth daily. 30 tablet 0   metFORMIN (GLUCOPHAGE-XR) 500 MG 24 hr tablet Take 500 mg by mouth 2 (two) times daily.     oxyCODONE-acetaminophen (PERCOCET/ROXICET) 5-325 MG tablet Take by mouth.     PARoxetine (PAXIL) 30 MG tablet Take 30 mg by mouth every morning.     polyethylene glycol (MIRALAX / GLYCOLAX)  17 g packet Take 17 g by mouth daily as needed for moderate constipation. 30 each 0   No current facility-administered medications for this visit.     Family History    Family History  Problem Relation Age of Onset   Dementia Mother    Congestive Heart Failure Mother    Bladder Cancer Father    Heart disease Father    Breast cancer Neg Hx    She indicated that her mother is deceased. She indicated that her father is deceased. She indicated that the status of her neg hx is unknown.  Social History    Social History   Socioeconomic History   Marital status: Single    Spouse name: Not on file   Number of children: Not on file   Years of education: Not on file  Highest education level: Not on file  Occupational History   Not on file  Tobacco Use   Smoking status: Every Day    Packs/day: 0.50    Years: 68.00    Total pack years: 34.00    Types: Cigarettes   Smokeless tobacco: Never  Vaping Use   Vaping Use: Never used  Substance and Sexual Activity   Alcohol use: Never   Drug use: Never   Sexual activity: Not on file  Other Topics Concern   Not on file  Social History Narrative   Lives locally.  Does not routinely exercise.   Social Determinants of Health   Financial Resource Strain: Not on file  Food Insecurity: Not on file  Transportation Needs: Not on file  Physical Activity: Not on file  Stress: Not on file  Social Connections: Not on file  Intimate Partner Violence: Not on file     Review of Systems    General:  No chills, fever, night sweats or weight changes.  Cardiovascular:  No chest pain, dyspnea on exertion, orthopnea, palpitations, paroxysmal nocturnal dyspnea, endorses peipeheral edema to the bilateral lower extremities Dermatological: No rash, lesions/masses Respiratory: No cough, dyspnea Urologic: No hematuria, dysuria Abdominal:   No nausea, vomiting, diarrhea, bright red blood per rectum, melena, or hematemesis Neurologic:  No visual  changes, changes in mental status, endorses generalized weakness and back pain All other systems reviewed and are otherwise negative except as noted above.     Physical Exam    VS:  BP (!) 146/80 (BP Location: Left Arm, Patient Position: Sitting, Cuff Size: Normal)   Pulse 87   Ht 5' (1.524 m)   Wt 126 lb (57.2 kg)   SpO2 96%   BMI 24.61 kg/m  , BMI Body mass index is 24.61 kg/m.     GEN: Well nourished, well developed, in no acute distress. HEENT: normal. Neck: Supple, no JVD, carotid bruits, or masses. Cardiac: RRR, no murmurs, rubs, or gallops. No clubbing, cyanosis, 1+ edema to the bilateral lower extremity.  Radials/DP/PT 2+ and equal bilaterally.  Respiratory:  Respirations regular and unlabored, clear to auscultation bilaterally. GI: Soft, nontender, nondistended, BS + x 4. MS: no deformity or atrophy. Skin: warm and dry, no rash. Neuro:  Strength and sensation are intact. Psych: Normal affect.  Accessory Clinical Findings    ECG personally reviewed by me today- No new tracings were completed today.  Lab Results  Component Value Date   WBC 4.5 09/05/2021   HGB 9.9 (L) 09/05/2021   HCT 34.0 (L) 09/05/2021   MCV 83.3 09/05/2021   PLT 444 (H) 09/05/2021   Lab Results  Component Value Date   CREATININE 0.62 11/15/2021   BUN 13 11/15/2021   NA 140 11/15/2021   K 4.2 11/15/2021   CL 105 11/15/2021   CO2 27 11/15/2021   Lab Results  Component Value Date   ALT 21 09/02/2021   AST 28 09/02/2021   ALKPHOS 142 (H) 09/02/2021   BILITOT 0.7 09/02/2021   Lab Results  Component Value Date   CHOL 153 11/15/2021   HDL 49 11/15/2021   LDLCALC 87 11/15/2021   TRIG 84 11/15/2021   CHOLHDL 3.1 11/15/2021    Lab Results  Component Value Date   HGBA1C 6.2 (H) 05/18/2021    Assessment & Plan   1.  HFrEF continues to have asymptomatic lower extremity edema that has been present since her hospitalization in 04/2021.  Has improved since discharged and she continues  on 20 mg of furosemide daily.  She has been encouraged to continue on her carvedilol and her losartan but unfortunately was unable to continue with the [place] as she was having recurrent yeast infections.  On a return appointment if her blood pressure continues to allow we can change her losartan over to [facility] for better goal-directed medical therapy but with her undergoing surgery later on this week and currently having some medications on hold there were no medication changes that were made to her current medication regimen today.  2.Coronary artery disease remained stable as patient does not report angina.  Given her history of a stroke in 04/2021 and extensive arthrosclerotic plaque throughout the aorta heart catheterization was deferred due to the high risk for periprocedural stroke or other emboli like phenomenon during catheterization.  At this point wishes to focus on aggressive medical therapy as she continues to work through having a kyphoplasty done on Wednesday.    3.Hyperlipidemia she is to continue atorvastatin 20 mg 3 times a week.  LDL of 87 on 11/15/2021.  Recommend increasing statin therapy from 3 to 4 days a week. Ongoing management by PCP.  4. H/O CVA she has no new neurological deficits.  Continue aggressive secondary prevention.  Currently Plavix and aspirin are on hold for kyphoplasty later this week.  Continue with lipid therapy for target LDL of less than 70.  5.  Aortic atherosclerosis: Continue aggressive medical therapy as well as risk factor modification to include smoking cessation  6.  Tobacco abuse: Smoking cessation is recommended  Disposition: Patient returning to clinic in 5 to 6 months with an EKG on return.  She and her sister both been notified if she were to need anything in the interim to notify us that she can be seen sooner.  She is also been advised to notify us if she has any weight gain, shortness of breath, or chest discomfort.  Kinleigh Nault,  NP 11/21/2021, 2:40 PM

## 2021-11-21 NOTE — Patient Instructions (Signed)
Medication Instructions:  Your physician recommends that you continue on your current medications as directed. Please refer to the Current Medication list given to you today.  *If you need a refill on your cardiac medications before your next appointment, please call your pharmacy*   Lab Work: None ordered If you have labs (blood work) drawn today and your tests are completely normal, you will receive your results only by: [place] (if you have MyChart) OR A paper copy in the mail If you have any lab test that is abnormal or we need to change your treatment, we will call you to review the results.   Testing/Procedures: None ordered   Follow-Up: At [facility], you and your health needs are our priority.  As part of our continuing mission to provide you with exceptional heart care, we have created designated Provider Care Teams.  These Care Teams include your primary Cardiologist (physician) and Advanced Practice Providers (APPs -  Physician Assistants and Nurse Practitioners) who all work together to provide you with the care you need, when you need it.  We recommend signing up for the patient portal called "MyChart".  Sign up information is provided on this After Visit Summary.  MyChart is used to connect with patients for Virtual Visits (Telemedicine).  Patients are able to view lab/test results, encounter notes, upcoming appointments, etc.  Non-urgent messages can be sent to your provider as well.   To learn more about what you can do with MyChart, go to [email or web address].    Your next appointment:   6 month(s)  The format for your next appointment:   In Person  Provider:   You may see Nelva Bush, MD or one of the following Advanced Practice Providers on your designated Care Team:   Murray Hodgkins, NP Christell Faith, PA-C Cadence Kathlen Mody, [place]   Other Instructions N/A  Important Information About Sugar

## 2021-11-24 ENCOUNTER — Other Ambulatory Visit: Payer: Self-pay | Admitting: Nurse Practitioner

## 2021-11-24 ENCOUNTER — Ambulatory Visit
Admission: RE | Admit: 2021-11-24 | Discharge: 2021-11-24 | Disposition: A | Discharge status: Home / Self Care | Payer: Medicare Other | Source: Ambulatory Visit | Attending: Neurosurgery | Admitting: Neurosurgery

## 2021-11-24 ENCOUNTER — Other Ambulatory Visit: Payer: Self-pay | Admitting: Internal Medicine

## 2021-11-24 DIAGNOSIS — S22080A Wedge compression fracture of T11-T12 vertebra, initial encounter for closed fracture: Secondary | ICD-10-CM

## 2021-11-24 HISTORY — PX: IR KYPHO THORACIC WITH BONE BIOPSY: IMG5518

## 2021-11-24 MED ORDER — ACETAMINOPHEN 10 MG/ML IV SOLN
1000.0000 mg | Freq: Once | INTRAVENOUS | Status: AC
  Administered 2021-11-24: 1000 mg via INTRAVENOUS

## 2021-11-24 MED ORDER — SODIUM CHLORIDE 0.9 % IV SOLN: INTRAVENOUS | Status: DC

## 2021-11-24 MED ORDER — CEFAZOLIN SODIUM-DEXTROSE 2-4 GM/100ML-% IV SOLN
2.0000 g | INTRAVENOUS | Status: AC
  Administered 2021-11-24: 2 g via INTRAVENOUS

## 2021-11-24 MED ORDER — MIDAZOLAM HCL 2 MG/2ML IJ SOLN
1.0000 mg | INTRAMUSCULAR | Status: DC | PRN
  Administered 2021-11-24 (×2): 0.5 mg via INTRAVENOUS

## 2021-11-24 MED ORDER — FENTANYL CITRATE PF 50 MCG/ML IJ SOSY
25.0000 ug | PREFILLED_SYRINGE | INTRAMUSCULAR | Status: DC | PRN
  Administered 2021-11-24 (×2): 25 ug via INTRAVENOUS

## 2021-11-24 NOTE — Discharge Instructions (Signed)
Kyphoplasty Post Procedure Discharge Instructions  May resume a regular diet and any medications that you routinely take (including pain medications). However, if you are taking Aspirin or an anticoagulant/blood thinner you will be told when you can resume taking these by the healthcare provider. No driving day of procedure. The day of your procedure take it easy. You may use an ice pack as needed to injection sites on back.  Ice to back 30 minutes on and 30 minutes off, as needed. May remove bandaids tomorrow after taking a shower. Replace daily with a clean bandaid until healed.  Do not lift anything heavier than a milk jug for 1-2 weeks or determined by your physician.  Follow up with your physician in 2 weeks.    Please contact our office at [phone] for the following symptoms or if you have any questions:  Fever greater than 100 degrees Increased swelling, pain, or redness at injection site. Increased back and/or leg pain New numbness or change in symptoms from before the procedure.    Thank you for visiting [facility].   MAY RESUME ASPIRIN AND PLAVIX IMMEDIATELY AFTER THE PROCEDURE!

## 2021-11-24 NOTE — Procedures (Signed)
Interventional Radiology Procedure Note  Procedure:   Image guided T12 vertebral augmentation, KP technique. Right extrapedicular approach   Complications: None Recommendations:  - 1 hr recovery - advance diet - No shower x 24 hrs - routine wound care - Do not submerge for 7 days - Follow up with Dr. Earleen Newport in 2 weeks  Signed,  Dulcy Fanny. Earleen Newport, DO

## 2021-11-24 NOTE — Progress Notes (Signed)
Interventional Radiology Preprocedure/Progress Note   Marissa Fletcher is a very pleasant 81 year old female presenting today for treatment of T12 osteoporotic compression fracture, with life-style limiting symptoms of pain.   She tells me that pain has not changed since our last visit with her on 6/29.  She does state, however, that she has some new "dizziness", as of a couple of days ago.  No new facial or extremity weakness.  No new AMS or vision changes.    She does have history of stroke, and she has upcoming appointment with neurology.   Today we will proceed with image guided vertebral augmentation of t12 compression fracture.  There is question of whether a biopsy is indicated based on her history of prior lung CA.  She tells me she has no treatment plan or knowledge of treatment plan for recurrent/progressive cancer.  The MRI imaging is compatible with benign, osteoporotic fracture.  After discussing, her preference, as well as her daughters preference, is NOT to have biopsy.  We will proceed with treatment only.    Risks and benefits of T12 vertebral augmentation were discussed with the patient including, but not limited to education regarding the natural healing process of compression fractures without intervention, bleeding, infection, cement migration which may cause spinal cord damage, paralysis, pulmonary embolism or even death.  This interventional procedure involves the use of X-rays and because of the nature of the planned procedure, it is possible that we will have prolonged use of X-ray fluoroscopy.  Potential radiation risks to you include (but are not limited to) the following: - A slightly elevated risk for cancer  several years later in life. This risk is typically less than 0.5% percent. This risk is low in comparison to the normal incidence of human cancer, which is 33% for women and 50% for men according to the [place]. - Radiation induced injury can include  skin redness, resembling a rash, tissue breakdown / ulcers and hair loss (which can be temporary or permanent).   The likelihood of either of these occurring depends on the difficulty of the procedure and whether you are sensitive to radiation due to previous procedures, disease, or genetic conditions.   IF your procedure requires a prolonged use of radiation, you will be notified and given written instructions for further action.  It is your responsibility to monitor the irradiated area for the 2 weeks following the procedure and to notify your physician if you are concerned that you have suffered a radiation induced injury.    All of the patient's questions were answered, patient is agreeable to proceed.  Consent signed and in chart.   Signed,  Dulcy Fanny. Earleen Newport, DO

## 2021-11-24 NOTE — Progress Notes (Signed)
Pt back in nursing recovery area. Pt still drowsy from procedure but will wake up when spoken to. Pt follows commands, talks in complete sentences and has no complaints at this time. Pt will remain in nursing station until discharge.  ?

## 2021-12-02 ENCOUNTER — Other Ambulatory Visit: Payer: Self-pay | Admitting: Interventional Radiology

## 2021-12-02 DIAGNOSIS — Z712 Person consulting for explanation of examination or test findings: Secondary | ICD-10-CM

## 2021-12-05 ENCOUNTER — Other Ambulatory Visit: Payer: Self-pay | Admitting: Nurse Practitioner

## 2021-12-05 ENCOUNTER — Other Ambulatory Visit: Payer: Self-pay | Admitting: Internal Medicine

## 2021-12-05 ENCOUNTER — Telehealth: Payer: Self-pay

## 2021-12-05 ENCOUNTER — Other Ambulatory Visit (INDEPENDENT_AMBULATORY_CARE_PROVIDER_SITE_OTHER): Payer: Self-pay | Admitting: Vascular Surgery

## 2021-12-05 DIAGNOSIS — I6523 Occlusion and stenosis of bilateral carotid arteries: Secondary | ICD-10-CM

## 2021-12-05 NOTE — Telephone Encounter (Signed)
Phone call to pt to follow up from her kyphoplasty on 11/24/21. Pt reports her pain is better post procedure but is still having "a little soreness" and a "twinge/sharp" pain when she moves in a certain way. Pt reports she is able to move around a little better. Pt denies any signs of infection, redness at the site, draining or fever. Pt. Is scheduled for an in office follow up visit with Dr. Denna Haggard 12/08/21. Pt advised to call back if anything were to change or any concerns arise between now and her appointment. Pt verbalized understanding.

## 2021-12-06 ENCOUNTER — Ambulatory Visit (INDEPENDENT_AMBULATORY_CARE_PROVIDER_SITE_OTHER): Payer: Medicare Other | Admitting: Vascular Surgery

## 2021-12-06 ENCOUNTER — Ambulatory Visit (INDEPENDENT_AMBULATORY_CARE_PROVIDER_SITE_OTHER): Payer: Medicare Other

## 2021-12-06 ENCOUNTER — Encounter (INDEPENDENT_AMBULATORY_CARE_PROVIDER_SITE_OTHER): Payer: Self-pay | Admitting: Vascular Surgery

## 2021-12-06 VITALS — BP 116/66 | HR 94 | Resp 16 | Wt 126.8 lb

## 2021-12-06 DIAGNOSIS — I6523 Occlusion and stenosis of bilateral carotid arteries: Secondary | ICD-10-CM

## 2021-12-06 DIAGNOSIS — I6529 Occlusion and stenosis of unspecified carotid artery: Secondary | ICD-10-CM | POA: Insufficient documentation

## 2021-12-06 DIAGNOSIS — I7143 Infrarenal abdominal aortic aneurysm, without rupture: Secondary | ICD-10-CM

## 2021-12-06 DIAGNOSIS — I10 Essential (primary) hypertension: Secondary | ICD-10-CM

## 2021-12-06 DIAGNOSIS — E1169 Type 2 diabetes mellitus with other specified complication: Secondary | ICD-10-CM | POA: Diagnosis not present

## 2021-12-06 DIAGNOSIS — E785 Hyperlipidemia, unspecified: Secondary | ICD-10-CM

## 2021-12-06 NOTE — Assessment & Plan Note (Signed)
lipid control important in reducing the progression of atherosclerotic disease. Continue statin therapy  

## 2021-12-06 NOTE — Assessment & Plan Note (Signed)
3.3 cm on CT scan about 6 months ago.  There was extensive mural thrombus and I will plan on repeating a CT scan in about 6 months.

## 2021-12-06 NOTE — Assessment & Plan Note (Signed)
blood glucose control important in reducing the progression of atherosclerotic disease. Also, involved in wound healing. On appropriate medications.  

## 2021-12-06 NOTE — Assessment & Plan Note (Signed)
We did a follow-up carotid duplex today that had velocities that would fall in the mild range bilaterally less than 50%.  This appears to be a little less than the previous CT scan, but there did not appear to be significant progression recommend any surgical intervention at this point.  I will see her back in 6 months with repeat duplex.  Continue current medical regimen.

## 2021-12-06 NOTE — Progress Notes (Signed)
Patient ID: Marissa Fletcher, female   DOB: 09/24/1940, 81 y.o.   MRN: [id number]  Chief Complaint  Patient presents with   Follow-up    Ultrasound follow up    HPI Marissa Fletcher is a 81 y.o. female.  I am asked to see the patient by on hospital follow up after consultation about 6 months ago for multiple vascular issues.  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  That will be slated for later this year.  No signs of peripheral embolization or symptoms from her aortic disease.  She was also found to have mild to moderate carotid disease.  She had a CT angiogram which I reviewed at that time which had suggested potentially up to a 60% stenosis in each carotid artery although I felt it was slightly less than that.  She has had no further neurologic issues since that event.  We did a follow-up carotid duplex today that had velocities that would fall in the mild range bilaterally less than 50%.    Past Medical History:  Diagnosis Date   Anxiety    Aortic atherosclerosis ([place])    Arthritis    Cardiomyopathy ([place])    a. 04/2021 Echo: EF 35-40%, glob HK. GrII DD.  Nl RV size/fxn. Mod dil LA. Mild MR.   Carotid arterial disease ([place])    a. 04/2021 CTA Head/neck: RICA 60, LICA 55.   COPD (chronic obstructive pulmonary disease) (HCC)    Depression    Diabetes mellitus without complication ([place])    History of kidney stones    Hypertension    Lung cancer ([place])    Stroke (cerebrum) ([place])    a. 04/2021 MRI brain: Acute infarct post limb of R internal capsule.   Thoracic aortic aneurysm ([place])    Tobacco abuse     Past Surgical History:  Procedure Laterality Date   ABDOMINAL HYSTERECTOMY     CHOLECYSTECTOMY     EYE SURGERY Bilateral    IR KYPHO THORACIC WITH BONE BIOPSY  11/24/2021   KYPHOPLASTY N/A 07/30/2020   Procedure: T10 Kyphoplasty;  Surgeon: Hessie Knows, MD;  Location: [facility]  ORS;  Service: Orthopedics;  Laterality: N/A;  T10   KYPHOPLASTY N/A 08/19/2020   Procedure: T9 YPHOPLASTY;  Surgeon: Hessie Knows, MD;  Location: [facility] ORS;  Service: Orthopedics;  Laterality: N/A;     Family History  Problem Relation Age of Onset   Dementia Mother    Congestive Heart Failure Mother    Bladder Cancer Father    Heart disease Father    Breast cancer Neg Hx       Social History   Tobacco Use   Smoking status: Every Day    Packs/day: 0.50    Years: 68.00    Total pack years: 34.00    Types: Cigarettes   Smokeless tobacco: Never  Vaping Use   Vaping Use: Never used  Substance Use Topics   Alcohol use: Never   Drug use: Never     Allergies  Allergen Reactions   Farxiga [Dapagliflozin] Other (See Comments)    Recurring yeast infection   Sulfa Antibiotics Swelling    Current Outpatient Medications  Medication Sig Dispense Refill   acetaminophen (TYLENOL) 500 MG tablet Take 2 tablets (1,000 mg total) by mouth 3 (three) times daily as needed for mild pain, moderate pain or headache. 30 tablet 0  ALPRAZolam (XANAX) 0.25 MG tablet Take 1 tablet (0.25 mg total) by mouth daily as needed for anxiety. 10 tablet 0   aspirin EC 81 MG EC tablet Take 1 tablet (81 mg total) by mouth daily. Swallow whole. 30 tablet 11   atorvastatin (LIPITOR) 20 MG tablet Take 20 mg by mouth every Monday, Wednesday, and Friday.     carvedilol (COREG) 6.25 MG tablet TAKE 1 TABLET BY MOUTH TWICE A DAY WITH MEALS 60 tablet 3   clopidogrel (PLAVIX) 75 MG tablet Take 1 tablet (75 mg total) by mouth daily. 30 tablet 12   doxepin (SINEQUAN) 25 MG capsule Take 50-75 mg by mouth at bedtime.     ferrous sulfate 325 (65 FE) MG tablet Take 1 tablet (325 mg total) by mouth daily. 30 tablet 3   furosemide (LASIX) 20 MG tablet Take 1 tablet (20 mg total) by mouth daily. 30 tablet 0   gabapentin (NEURONTIN) 300 MG capsule Take 300 mg by mouth at bedtime.     magnesium oxide (MAG-OX) 400 (240 Mg) MG  tablet Take 1 tablet (400 mg total) by mouth daily. 30 tablet 0   metFORMIN (GLUCOPHAGE-XR) 500 MG 24 hr tablet Take 500 mg by mouth 2 (two) times daily.     oxyCODONE-acetaminophen (PERCOCET/ROXICET) 5-325 MG tablet Take by mouth.     PARoxetine (PAXIL) 30 MG tablet Take 30 mg by mouth every morning.     albuterol (VENTOLIN HFA) 108 (90 Base) MCG/ACT inhaler Inhale 2 puffs into the lungs every 6 (six) hours as needed for wheezing or shortness of breath. 8 g 0   polyethylene glycol (MIRALAX / GLYCOLAX) 17 g packet Take 17 g by mouth daily as needed for moderate constipation. 30 each 0   No current facility-administered medications for this visit.      REVIEW OF SYSTEMS (Negative unless checked)  Constitutional: [] Weight loss  [] Fever  [] Chills Cardiac: [] Chest pain   [] Chest pressure   [] Palpitations   [] Shortness of breath when laying flat   [] Shortness of breath at rest   [x] Shortness of breath with exertion. Vascular:  [] Pain in legs with walking   [] Pain in legs at rest   [] Pain in legs when laying flat   [] Claudication   [] Pain in feet when walking  [] Pain in feet at rest  [] Pain in feet when laying flat   [] History of DVT   [] Phlebitis   [] Swelling in legs   [] Varicose veins   [] Non-healing ulcers Pulmonary:   [] Uses home oxygen   [] Productive cough   [] Hemoptysis   [] Wheeze  [x] COPD   [] Asthma Neurologic:  [] Dizziness  [] Blackouts   [] Seizures   [x] History of stroke   [] History of TIA  [] Aphasia   [] Temporary blindness   [] Dysphagia   [] Weakness or numbness in arms   [] Weakness or numbness in legs Musculoskeletal:  [x] Arthritis   [] Joint swelling   [] Joint pain   [x] Low back pain Hematologic:  [] Easy bruising  [] Easy bleeding   [] Hypercoagulable state   [] Anemic  [] Hepatitis Gastrointestinal:  [] Blood in stool   [] Vomiting blood  [x] Gastroesophageal reflux/heartburn   [] Abdominal pain Genitourinary:  [] Chronic kidney disease   [] Difficult urination  [] Frequent urination  [] Burning with  urination   [] Hematuria Skin:  [] Rashes   [] Ulcers   [] Wounds Psychological:  [x] History of anxiety   []  History of major depression.    Physical Exam BP 116/66 (BP Location: Right Arm)   Pulse 94   Resp 16   Wt 126 lb  12.8 oz (57.5 kg)   BMI 24.76 kg/m  Gen:  WD/WN, NAD. Very kyphotic.  Head: [place]/AT, No temporalis wasting.  Ear/Nose/Throat: Hearing grossly intact, nares w/o erythema or drainage, oropharynx w/o Erythema/Exudate Eyes: Conjunctiva clear, sclera non-icteric  Neck: trachea midline.  No JVD. No bruit Pulmonary:  Good air movement, respirations not labored, no use of accessory muscles  Cardiac: RRR, no JVD Vascular:  Vessel Right Left  Radial Palpable Palpable                                   Gastrointestinal:. No masses, surgical incisions, or scars. Musculoskeletal: M/S 5/5 throughout.  Extremities without ischemic changes.  No deformity or atrophy. Walks with a cane. No LE edema. Neurologic: Sensation grossly intact in extremities.  Symmetrical.  Speech is fluent. Motor exam as listed above. Psychiatric: Judgment intact, Mood & affect appropriate for pt's clinical situation. Dermatologic: No rashes or ulcers noted.  No cellulitis or open wounds.    Radiology IR KYPHO THORACIC WITH BONE BIOPSY  Result Date: 11/24/2021 INDICATION: 81 year old female with symptomatic osteoporotic T12 compression fracture EXAM: IR KYPHO VERTEBRAL THORACIC AUGMENTATION COMPARISON:  None Available. MEDICATIONS: As antibiotic prophylaxis, 2 g Ancef was ordered pre-procedure and administered intravenously within 1 hour of incision. ANESTHESIA/SEDATION: Moderate (conscious) sedation was employed during this procedure. A total of Versed 1.0 mg and Fentanyl 50 mcg was administered intravenously. Moderate Sedation Time: 24 minutes. The patient's level of consciousness and vital signs were monitored continuously by radiology nursing throughout the procedure under my direct supervision.  FLUOROSCOPY TIME:  Fluoroscopy Time:   (42.7 mGy) COMPLICATIONS: 5 none PROCEDURE: Following a full explanation of the procedure along with the potentially associated complications, a witnessed informed consent was obtained. Specific risks that were discussed included bleeding, infection, injury to adjacent structures, neurologic injury, embolization of cement within the veins, failure of the procedure to improve pain, need for further procedure/ surgery, cardiopulmonary collapse, death. The patient understands the risks and wishes to proceed. The patient was placed prone on the fluoroscopic table. Nasal oxygen was administered. Physiologic monitoring was performed throughout the duration of the procedure. The skin overlying the T12 region was prepped and draped in the usual sterile fashion. The T12 vertebral body was identified and the right pedicle was infiltrated with 1% lidocaine. This was then followed by the advancement of an 8-gauge Medtronic needle into the vertebral body via right extra pedicular approach. Once the needle when in position, the bone auger was advanced into the cannula confirming position across the midline and central within vertebral body. The right cannula balloon was performed under fluoroscopic observation. Methylmethacrylate mixture was then reconstituted. Under biplane intermittent fluoroscopy, the methylmethacrylate was then injected into the cavity of T12 vertebral body with filling of the fracture cleft. No extravasation was noted posteriorly into the spinal canal. No epidural venous contamination was seen. The needles were then removed. Hemostasis was achieved at the skin entry site. There were no acute complications. Patient tolerated the procedure well. Transported to recovery in stable condition. IMPRESSION: Status post image guided T12 vertebral augmentation with kyphoplasty technique via a right-sided extra pedicular approach. Signed, Dulcy Fanny. Nadene Rubins, RPVI [facility] Radiology Electronically Signed   By: Corrie Mckusick D.O.   On: 11/24/2021 12:15   DG Radiologist Eval And Mgmt  Result Date: 11/17/2021 EXAM: NEW PATIENT OFFICE VISIT CHIEF COMPLAINT: Per EMR HISTORY OF  PRESENT ILLNESS: Patient reported that she fell in early April 2023 which resulted in significant back pain. MRI on April 15 demonstrated acute compression fracture of the T12 vertebral body. Since April, the pain has been treated with prescription narcotic pain medication (oxycodone) with minimal improvement in her pain, which she rates at 6-7/10, with the pain sometimes being 40/08 with certain movement. Additionally, the patient has been undergoing physical therapy, partly due to a stroke that she suffered in December 2022, without significant improvement in her pain. Additionally, she scores significant instability on the Roland Morris disability questionnaire with 15/24 positive. Of note, she is had previous kyphoplasty in 2022 at T9 and T10. Patient reports that she has osteoporosis. Additional review of systems positive for recent stroke in December 2022, heart failure with ejection fraction of 35-40%, lung cancer recently treated in 2022 with completion of radiation therapy, and COPD. Patient does not complain of active chest pain or shortness of breath. She is on dual anti-platelet therapy with Plavix and aspirin. REVIEW OF SYSTEMS: Per EMR PHYSICAL EXAMINATION: Per EMR ASSESSMENT AND PLAN: Per EMR Electronically Signed   By: Albin Felling M.D.   On: 11/17/2021 12:17    Labs Recent Results (from the past 2160 hour(s))  Lipid Profile     Status: None   Collection Time: 11/15/21 10:27 AM  Result Value Ref Range   Cholesterol 153 0 - 200 mg/dL   Triglycerides 84 <150 mg/dL   HDL 49 >40 mg/dL   Total CHOL/HDL Ratio 3.1 RATIO   VLDL 17 0 - 40 mg/dL   LDL Cholesterol 87 0 - 99 mg/dL    Comment:        Total Cholesterol/HDL:CHD Risk Coronary  Heart Disease Risk Table                     Men   Women  1/2 Average Risk   3.4   3.3  Average Risk       5.0   4.4  2 X Average Risk   9.6   7.1  3 X Average Risk  23.4   11.0        Use the calculated Patient Ratio above and the CHD Risk Table to determine the patient's CHD Risk.        ATP III CLASSIFICATION (LDL):  <100     mg/dL   Optimal  100-129  mg/dL   Near or Above                    Optimal  130-159  mg/dL   Borderline  160-189  mg/dL   High  >190     mg/dL   Very High Performed at [facility], [address] [place], [place] [address]   Basic metabolic panel     Status: Abnormal   Collection Time: 11/15/21 10:27 AM  Result Value Ref Range   Sodium 140 135 - 145 mmol/L   Potassium 4.2 3.5 - 5.1 mmol/L   Chloride 105 98 - 111 mmol/L   CO2 27 22 - 32 mmol/L   Glucose, Bld 100 (H) 70 - 99 mg/dL    Comment: Glucose reference range applies only to samples taken after fasting for at least 8 hours.   BUN 13 8 - 23 mg/dL   Creatinine, Ser 0.62 0.44 - 1.00 mg/dL   Calcium 9.2 8.9 - 10.3 mg/dL   GFR, Estimated >60 >60 mL/min    Comment: (NOTE) Calculated using  the CKD-EPI Creatinine Equation (2021)    Anion gap 8 5 - 15    Comment: Performed at [facility], [address] [place], [place] [address]    Assessment/Plan:  No problem-specific Assessment & Plan notes found for this encounter.      Leotis Pain 12/06/2021, 10:31 AM   This note was created with Dragon medical transcription system.  Any errors from dictation are unintentional.

## 2021-12-06 NOTE — Assessment & Plan Note (Signed)
blood pressure control important in reducing the progression of atherosclerotic disease and aneurysmal growth. On appropriate oral medications.

## 2021-12-08 ENCOUNTER — Other Ambulatory Visit: Payer: Medicare Other

## 2021-12-13 ENCOUNTER — Ambulatory Visit
Admission: RE | Admit: 2021-12-13 | Discharge: 2021-12-13 | Disposition: A | Discharge status: Home / Self Care | Payer: Medicare Other | Source: Ambulatory Visit | Attending: Interventional Radiology | Admitting: Interventional Radiology

## 2021-12-13 DIAGNOSIS — Z712 Person consulting for explanation of examination or test findings: Secondary | ICD-10-CM

## 2021-12-13 HISTORY — PX: IR RADIOLOGIST EVAL & MGMT: IMG5224

## 2021-12-13 NOTE — Progress Notes (Signed)
Interventional Radiology - Clinic Visit, Follow-up Note    History of Present Illness  Marissa Fletcher is a 81 y.o. female with a history of mulriple compression fractures, most recently at T12, s/p T12 KP on November 24 2021.   Patient returns following T12 kyphoplasty on July 6 for a traumatic and osteoporotic compression fracture.  Overall, the patient states that her pain is mildly improved.  She is able to get around better with improved mobility.  She complains of new intermittent sharp pain that wraps around her lower back.  Denies any new trauma.  Focused review of systems negative for fever, chest pain, loss of appetite.      Past medical and surgical history reviewed. No interval changes. No interval hospitalizations.   Medications  I have reviewed the current medication list. Refer to chart for details. Current Outpatient Medications  Medication Instructions   acetaminophen (TYLENOL) 1,000 mg, Oral, 3 times daily PRN   albuterol (VENTOLIN HFA) 108 (90 Base) MCG/ACT inhaler 2 puffs, Inhalation, Every 6 hours PRN   ALPRAZolam (XANAX) 0.25 mg, Oral, Daily PRN   aspirin EC 81 mg, Oral, Daily, Swallow whole.   atorvastatin (LIPITOR) 20 mg, Oral, Every M-W-F   carvedilol (COREG) 6.25 MG tablet TAKE 1 TABLET BY MOUTH TWICE A DAY WITH MEALS   clopidogrel (PLAVIX) 75 mg, Oral, Daily   doxepin (SINEQUAN) 50-75 mg, Oral, Daily at bedtime   ferrous sulfate 325 mg, Oral, Daily   furosemide (LASIX) 20 mg, Oral, Daily   gabapentin (NEURONTIN) 300 mg, Oral, Nightly   magnesium oxide (MAG-OX) 400 mg, Oral, Daily   metFORMIN (GLUCOPHAGE-XR) 500 mg, Oral, 2 times daily   oxyCODONE-acetaminophen (PERCOCET/ROXICET) 5-325 MG tablet Oral   PARoxetine (PAXIL) 30 mg, Oral, BH-each morning   polyethylene glycol (MIRALAX / GLYCOLAX) 17 g, Oral, Daily PRN      Physical Exam Current Vitals   ( )                    There is no height or weight on file to calculate BMI.  General: Alert and  answers questions appropriately.   Cardiac: Regular rate.  Pulmonary: Normal work of breathing.  Abdominal: Soft without distension.   Pertinent Lab Results    Latest Ref Rng & Units 09/05/2021    3:19 AM 09/04/2021    5:11 AM 09/03/2021    4:35 AM  CBC  WBC 4.0 - 10.5 K/uL 4.5  5.1  6.0   Hemoglobin 12.0 - 15.0 g/dL 9.9  9.6  9.2   Hematocrit 36.0 - 46.0 % 34.0  31.8  30.0   Platelets 150 - 400 K/uL 444  432  450       Latest Ref Rng & Units 11/15/2021   10:27 AM 09/05/2021    3:19 AM 09/04/2021    5:11 AM  CMP  Glucose 70 - 99 mg/dL 100  99  98   BUN 8 - 23 mg/dL 13  9  10    Creatinine 0.44 - 1.00 mg/dL 0.62  0.60  0.54   Sodium 135 - 145 mmol/L 140  138  137   Potassium 3.5 - 5.1 mmol/L 4.2  4.4  3.0   Chloride 98 - 111 mmol/L 105  102  100   CO2 22 - 32 mmol/L 27  30  29    Calcium 8.9 - 10.3 mg/dL 9.2  8.3  8.0      Relevant and/or Recent Imaging: No new imaging  Assessment & Plan  Patient returns following T12 kyphoplasty on July 6 for a traumatic and osteoporotic compression fracture.  Overall, the patient states that her pain is mildly improved with improved mobility.  She complains of new intermittent sharp pain that wraps around her lower back, which sounds more muscular in etiology.   I explained to the patient can expect some continued healing over the next 2 weeks, during which her pain may continue to improve. I encouraged follow up with her PCP for more chronic back pain issues which she may continue to have given history of previous fractures. If pain is still present in 2 more weeks, she may benefit from repeat radiograph to ensure no new fracture.    I spent a total of  25 Minutes in face-to-face in clinical consultation, greater than 50% of which was spent on medical decision-making and counseling/coordinating care for T12 compression fracture.     Albin Felling, MD  Vascular and Interventional Radiology 12/13/2021 10:46 AM

## 2022-01-11 ENCOUNTER — Telehealth: Payer: Self-pay | Admitting: *Deleted

## 2022-01-12 NOTE — Telephone Encounter (Signed)
Refill request form received from pt's [place] pt assistance program [employer].  Form completed and Dr. Saunders Revel has signed.  Fax back to [employer] at [phone] with confirmation fax received.  Paperwork placed in [employer].

## 2022-01-16 ENCOUNTER — Other Ambulatory Visit: Payer: Self-pay

## 2022-01-16 DIAGNOSIS — S22080D Wedge compression fracture of T11-T12 vertebra, subsequent encounter for fracture with routine healing: Secondary | ICD-10-CM

## 2022-01-17 ENCOUNTER — Ambulatory Visit (INDEPENDENT_AMBULATORY_CARE_PROVIDER_SITE_OTHER): Payer: Medicare Other | Admitting: Neurosurgery

## 2022-01-17 ENCOUNTER — Encounter: Payer: Self-pay | Admitting: Neurosurgery

## 2022-01-17 ENCOUNTER — Other Ambulatory Visit: Payer: Self-pay | Admitting: Neurosurgery

## 2022-01-17 ENCOUNTER — Ambulatory Visit
Admission: RE | Admit: 2022-01-17 | Discharge: 2022-01-17 | Disposition: A | Discharge status: Home / Self Care | Payer: Medicare Other | Source: Ambulatory Visit | Attending: Neurosurgery | Admitting: Neurosurgery

## 2022-01-17 ENCOUNTER — Ambulatory Visit
Admission: RE | Admit: 2022-01-17 | Discharge: 2022-01-17 | Disposition: A | Discharge status: Home / Self Care | Payer: Medicare Other | Attending: Family Medicine | Admitting: Family Medicine

## 2022-01-17 VITALS — BP 108/60 | Ht 60.0 in | Wt 127.0 lb

## 2022-01-17 DIAGNOSIS — S22080D Wedge compression fracture of T11-T12 vertebra, subsequent encounter for fracture with routine healing: Secondary | ICD-10-CM

## 2022-01-17 DIAGNOSIS — W19XXXD Unspecified fall, subsequent encounter: Secondary | ICD-10-CM | POA: Diagnosis not present

## 2022-01-17 NOTE — Progress Notes (Signed)
Follow-up note: Referring Physician:  Juluis Pitch, MD [address] [place],  [place] [address]  Primary Physician:  Juluis Pitch, MD  Chief Complaint:  3 month follow up of T12 compression fracture  History of Present Illness: Marissa Fletcher is a 81 y.o. female who presents today for 3 month follow up. She underwent a kyphoplasty on 11/24/21. Today she reports significant relieve of her back pain since this procedure. She denies any additional concerns today.  LOV 10/14/21 Marissa Fletcher is a 81 y.o. female who presents for hospital follow up of T12 fracture. She was seen on 4/15 by Dr. Dava Najjar after a fall.  Today she states she continues to have some back pain however it is improved some since her fall about 6 weeks ago. She continues to wear her LSO brace but she does have some pain that radiates into her mid back and laterally. She continues to take oxycodone I have to 1 tablet once to twice a day. She denies any additional symptoms. Overall she feels that she is improving some.  Review of Systems:  A 10 point review of systems is negative, and the pertinent positives and negatives detailed in the HPI.  Past Medical History: Past Medical History:  Diagnosis Date   Anxiety    Aortic atherosclerosis ([place])    Arthritis    Cardiomyopathy ([place])    a. 04/2021 Echo: EF 35-40%, glob HK. GrII DD.  Nl RV size/fxn. Mod dil LA. Mild MR.   Carotid arterial disease ([place])    a. 04/2021 CTA Head/neck: RICA 60, LICA 55.   COPD (chronic obstructive pulmonary disease) (HCC)    Depression    Diabetes mellitus without complication ([place])    History of kidney stones    Hypertension    Lung cancer ([place])    Stroke (cerebrum) ([place])    a. 04/2021 MRI brain: Acute infarct post limb of R internal capsule.   Thoracic aortic aneurysm (H[facility])    Tobacco abuse     Past Surgical History: Past Surgical History:  Procedure Laterality Date   ABDOMINAL HYSTERECTOMY     CHOLECYSTECTOMY      EYE SURGERY Bilateral    IR KYPHO THORACIC WITH BONE BIOPSY  11/24/2021   IR RADIOLOGIST EVAL & MGMT  12/13/2021   KYPHOPLASTY N/A 07/30/2020   Procedure: T10 Kyphoplasty;  Surgeon: Hessie Knows, MD;  Location: [facility] ORS;  Service: Orthopedics;  Laterality: N/A;  T10   KYPHOPLASTY N/A 08/19/2020   Procedure: T9 YPHOPLASTY;  Surgeon: Hessie Knows, MD;  Location: [facility] ORS;  Service: Orthopedics;  Laterality: N/A;    Allergies: Allergies as of 01/17/2022 - Review Complete 12/06/2021  Allergen Reaction Noted   Farxiga [dapagliflozin] Other (See Comments) 11/21/2021   Sulfa antibiotics Swelling 04/23/2015    Medications: Outpatient Encounter Medications as of 01/17/2022  Medication Sig   acetaminophen (TYLENOL) 500 MG tablet Take 2 tablets (1,000 mg total) by mouth 3 (three) times daily as needed for mild pain, moderate pain or headache.   albuterol (VENTOLIN HFA) 108 (90 Base) MCG/ACT inhaler Inhale 2 puffs into the lungs every 6 (six) hours as needed for wheezing or shortness of breath.   ALPRAZolam (XANAX) 0.25 MG tablet Take 1 tablet (0.25 mg total) by mouth daily as needed for anxiety.   aspirin EC 81 MG EC tablet Take 1 tablet (81 mg total) by mouth daily. Swallow whole.   atorvastatin (LIPITOR) 20 MG tablet Take 20 mg by mouth every Monday, Wednesday, and Friday.  carvedilol (COREG) 6.25 MG tablet TAKE 1 TABLET BY MOUTH TWICE A DAY WITH MEALS   clopidogrel (PLAVIX) 75 MG tablet Take 1 tablet (75 mg total) by mouth daily.   dapagliflozin propanediol (FARXIGA) 10 MG TABS tablet Take 10 mg by mouth daily. Receives via patient assistance program   doxepin (SINEQUAN) 25 MG capsule Take 50-75 mg by mouth at bedtime.   ferrous sulfate 325 (65 FE) MG tablet Take 1 tablet (325 mg total) by mouth daily.   furosemide (LASIX) 20 MG tablet Take 1 tablet (20 mg total) by mouth daily.   gabapentin (NEURONTIN) 300 MG capsule Take 300 mg by mouth at bedtime.   magnesium oxide (MAG-OX) 400 (240 Mg) MG  tablet Take 1 tablet (400 mg total) by mouth daily.   metFORMIN (GLUCOPHAGE-XR) 500 MG 24 hr tablet Take 500 mg by mouth 2 (two) times daily.   oxyCODONE-acetaminophen (PERCOCET/ROXICET) 5-325 MG tablet Take by mouth.   PARoxetine (PAXIL) 30 MG tablet Take 30 mg by mouth every morning.   polyethylene glycol (MIRALAX / GLYCOLAX) 17 g packet Take 17 g by mouth daily as needed for moderate constipation.   No facility-administered encounter medications on file as of 01/17/2022.    Social History: Social History   Tobacco Use   Smoking status: Every Day    Packs/day: 0.50    Years: 68.00    Total pack years: 34.00    Types: Cigarettes   Smokeless tobacco: Never  Vaping Use   Vaping Use: Never used  Substance Use Topics   Alcohol use: Never   Drug use: Never    Family Medical History: Family History  Problem Relation Age of Onset   Dementia Mother    Congestive Heart Failure Mother    Bladder Cancer Father    Heart disease Father    Breast cancer Neg Hx     Exam:  Today's Vitals   01/17/22 1120  BP: 108/60  Weight: 57.6 kg  Height: 5' (1.524 m)  PainSc: 4   PainLoc: Back   Body mass index is 24.8 kg/m.   General: A&O x3  Palpation of spine: non tender.  MAEW R>L due to previous stroke.   Bilateral lower extremity sensation is intact to light touch.  Ambulates with a flexed gait due to degenerative kyphosis.  Using a rolling walker for assistance  Imaging: 01/17/22 thoracic flex/ex show significant thoracic kyphosis similar to previous imaging. There is interval T12 augmentation without treatment kyphosis around her fracture.  I have personally reviewed the images and agree with the above interpretation.  Assessment and Plan: Marissa Fletcher is a pleasant 81 y.o. female with history of osteoporosis and multiple compression fractures most recently at T12 status post cement augmentation with improvement postprocedure.  We discussed the importance of treatment of her  osteoporosis which she will discuss further with her primary care provider and smoking cessation.  While she does have significant thoracic kyphosis, she is not particularly symptomatic from this at this time and has not likely a surgical candidate given her smoking history.  We will otherwise see her going forward on an as-needed basis.  She expressed understanding was in agreement with this plan.  Cooper Render PA-C Neurosurgery

## 2022-02-09 ENCOUNTER — Other Ambulatory Visit: Payer: Self-pay | Admitting: Internal Medicine

## 2022-02-09 NOTE — Telephone Encounter (Signed)
Per chart patient is supposed to be on Furosemide 20mg .   Spoke with Marissa Fletcher and they state they have not filled 20mg  or 40mg  in >200days. They do not have any current rx's on file for either dose.  LVM for patient to return call. Need clarification on which dose, if either, patient has been taking and where they have been picking it up.

## 2022-02-17 ENCOUNTER — Other Ambulatory Visit: Payer: Self-pay | Admitting: Internal Medicine

## 2022-02-20 NOTE — Telephone Encounter (Signed)
Please advise if ok to refill Furosemide.  Date: 08/18/2021 Department: [place] (1A) Ordering/Authorizing: Loletha Grayer, MD   Order Providers  Prescribing Provider Encounter Provider  Loletha Grayer, MD None   Outpatient Medication Detail   Disp Refills Start End   furosemide (LASIX) 20 MG tablet 30 tablet 0 08/18/2021    Sig - Route: Take 1 tablet (20 mg total) by mouth daily. - Oral   Class: Print

## 2022-03-01 ENCOUNTER — Emergency Department (HOSPITAL_COMMUNITY): Payer: Medicare Other

## 2022-03-01 ENCOUNTER — Emergency Department (HOSPITAL_COMMUNITY)
Admission: EM | Admit: 2022-03-01 | Discharge: 2022-03-01 | Disposition: A | Discharge status: Home / Self Care | Payer: Medicare Other | Attending: Emergency Medicine | Admitting: Emergency Medicine

## 2022-03-01 ENCOUNTER — Other Ambulatory Visit: Payer: Self-pay

## 2022-03-01 ENCOUNTER — Other Ambulatory Visit: Payer: Self-pay | Admitting: Nurse Practitioner

## 2022-03-01 DIAGNOSIS — Z7984 Long term (current) use of oral hypoglycemic drugs: Secondary | ICD-10-CM | POA: Insufficient documentation

## 2022-03-01 DIAGNOSIS — S7002XA Contusion of left hip, initial encounter: Secondary | ICD-10-CM | POA: Insufficient documentation

## 2022-03-01 DIAGNOSIS — Z79899 Other long term (current) drug therapy: Secondary | ICD-10-CM | POA: Insufficient documentation

## 2022-03-01 DIAGNOSIS — W19XXXA Unspecified fall, initial encounter: Secondary | ICD-10-CM

## 2022-03-01 DIAGNOSIS — I10 Essential (primary) hypertension: Secondary | ICD-10-CM | POA: Diagnosis not present

## 2022-03-01 DIAGNOSIS — Z7982 Long term (current) use of aspirin: Secondary | ICD-10-CM | POA: Insufficient documentation

## 2022-03-01 DIAGNOSIS — E119 Type 2 diabetes mellitus without complications: Secondary | ICD-10-CM | POA: Diagnosis not present

## 2022-03-01 DIAGNOSIS — W07XXXA Fall from chair, initial encounter: Secondary | ICD-10-CM | POA: Diagnosis not present

## 2022-03-01 DIAGNOSIS — J449 Chronic obstructive pulmonary disease, unspecified: Secondary | ICD-10-CM | POA: Diagnosis not present

## 2022-03-01 DIAGNOSIS — S0181XA Laceration without foreign body of other part of head, initial encounter: Secondary | ICD-10-CM | POA: Insufficient documentation

## 2022-03-01 DIAGNOSIS — S0990XA Unspecified injury of head, initial encounter: Secondary | ICD-10-CM | POA: Diagnosis present

## 2022-03-01 LAB — I-STAT CHEM 8, ED
BUN: 11 mg/dL (ref 8–23)
Calcium, Ion: 1.03 mmol/L — ABNORMAL LOW (ref 1.15–1.40)
Chloride: 102 mmol/L (ref 98–111)
Creatinine, Ser: 0.6 mg/dL (ref 0.44–1.00)
Glucose, Bld: 101 mg/dL — ABNORMAL HIGH (ref 70–99)
HCT: 32 % — ABNORMAL LOW (ref 36.0–46.0)
Hemoglobin: 10.9 g/dL — ABNORMAL LOW (ref 12.0–15.0)
Potassium: 3.9 mmol/L (ref 3.5–5.1)
Sodium: 139 mmol/L (ref 135–145)
TCO2: 26 mmol/L (ref 22–32)

## 2022-03-01 LAB — COMPREHENSIVE METABOLIC PANEL
ALT: 24 U/L (ref 0–44)
AST: 30 U/L (ref 15–41)
Albumin: 3.2 g/dL — ABNORMAL LOW (ref 3.5–5.0)
Alkaline Phosphatase: 114 U/L (ref 38–126)
Anion gap: 10 (ref 5–15)
BUN: 10 mg/dL (ref 8–23)
CO2: 27 mmol/L (ref 22–32)
Calcium: 8.7 mg/dL — ABNORMAL LOW (ref 8.9–10.3)
Chloride: 103 mmol/L (ref 98–111)
Creatinine, Ser: 0.64 mg/dL (ref 0.44–1.00)
GFR, Estimated: >60 mL/min (ref >60)
Glucose, Bld: 113 mg/dL — ABNORMAL HIGH (ref 70–99)
Potassium: 3.9 mmol/L (ref 3.5–5.1)
Sodium: 140 mmol/L (ref 135–145)
Total Bilirubin: 0.3 mg/dL (ref 0.3–1.2)
Total Protein: 6 g/dL — ABNORMAL LOW (ref 6.5–8.1)

## 2022-03-01 LAB — CBC
HCT: 33.5 % — ABNORMAL LOW (ref 36.0–46.0)
Hemoglobin: 10.1 g/dL — ABNORMAL LOW (ref 12.0–15.0)
MCH: 25.8 pg — ABNORMAL LOW (ref 26.0–34.0)
MCHC: 30.1 g/dL (ref 30.0–36.0)
MCV: 85.7 fL (ref 80.0–100.0)
Platelets: 290 10*3/uL (ref 150–400)
RBC: 3.91 MIL/uL (ref 3.87–5.11)
RDW: 15.9 % — ABNORMAL HIGH (ref 11.5–15.5)
WBC: 6.2 10*3/uL (ref 4.0–10.5)
nRBC: 0 % (ref 0.0–0.2)

## 2022-03-01 LAB — SAMPLE TO BLOOD BANK

## 2022-03-01 LAB — LACTIC ACID, PLASMA: Lactic Acid, Venous: 1.3 mmol/L (ref 0.5–1.9)

## 2022-03-01 LAB — ETHANOL: Alcohol, Ethyl (B): <10 mg/dL (ref <10)

## 2022-03-01 LAB — PROTIME-INR
INR: 1 (ref 0.8–1.2)
Prothrombin Time: 13 seconds (ref 11.4–15.2)

## 2022-03-01 MED ORDER — LIDOCAINE-EPINEPHRINE (PF) 2 %-1:200000 IJ SOLN
INTRAMUSCULAR | Status: AC
  Filled 2022-03-01: qty 20

## 2022-03-01 NOTE — ED Notes (Signed)
Trauma Response Nurse Documentation   Marissa Fletcher is a 81 y.o. female arriving to [facility] via [facility]  On clopidogrel 75 mg daily. Trauma was activated as a Level 2 by Kem Parkinson based on the following trauma criteria Elderly patients > 65 with head trauma on anti-coagulation (excluding ASA). Trauma team at the bedside on patient arrival.   Patient cleared for CT by Dr. Ralene Bathe. Pt transported to CT with trauma response nurse present to monitor. RN remained with the patient throughout their absence from the department for clinical observation.   GCS 15.  History   Past Medical History:  Diagnosis Date   Anxiety    Aortic atherosclerosis ([place])    Arthritis    Cardiomyopathy ([place])    a. 04/2021 Echo: EF 35-40%, glob HK. GrII DD.  Nl RV size/fxn. Mod dil LA. Mild MR.   Carotid arterial disease ([place])    a. 04/2021 CTA Head/neck: RICA 60, LICA 55.   COPD (chronic obstructive pulmonary disease) (HCC)    Depression    Diabetes mellitus without complication ([place])    History of kidney stones    Hypertension    Lung cancer ([place])    Stroke (cerebrum) ([place])    a. 04/2021 MRI brain: Acute infarct post limb of R internal capsule.   Thoracic aortic aneurysm ([place])    Tobacco abuse      Past Surgical History:  Procedure Laterality Date   ABDOMINAL HYSTERECTOMY     CHOLECYSTECTOMY     EYE SURGERY Bilateral    IR KYPHO THORACIC WITH BONE BIOPSY  11/24/2021   IR RADIOLOGIST EVAL & MGMT  12/13/2021   KYPHOPLASTY N/A 07/30/2020   Procedure: T10 Kyphoplasty;  Surgeon: Hessie Knows, MD;  Location: [facility] ORS;  Service: Orthopedics;  Laterality: N/A;  T10   KYPHOPLASTY N/A 08/19/2020   Procedure: T9 YPHOPLASTY;  Surgeon: Hessie Knows, MD;  Location: [facility] ORS;  Service: Orthopedics;  Laterality: N/A;       Initial Focused Assessment (If applicable, or please see trauma documentation): Airway-- intact Breathing-- spontaneous, unlabored, wheezing upon auscultation (hx of  COPD) Circulation-- laceration left forehead, bleeding uncontrolled upon arrival sutures placed by EDP after initial assessment  CT's Completed:   CT Head and CT C-Spine   Interventions:   Plan for disposition:  Unknown at this time.  Consults completed:  none at 0626.  Event Summary: Patient brought in by [facility]. Patient had a fall this morning while getting up from her wheelchair. Patient struck her head on corner of coffee table during fall. Upon arrival patient alert and oriented x4, GCS 15. Trauma labs obtained. Patient with a laceration to left side of forehead, bleeding uncontrolled. EDP placed 2 sutures to control bleeding. Patient with lower back pain. Xray chest and pelvis completed. CT head, c-spine completed.   Bedside handoff with ED RN Danae Chen.    Trudee Kuster  Trauma Response RN  Please call TRN at [phone] for further assistance.

## 2022-03-01 NOTE — ED Notes (Signed)
Pt left head lac to be sutured prior to scans

## 2022-03-01 NOTE — ED Triage Notes (Signed)
Pt BIB [facility] from home. Pt was sitting in her chair, got up to stand and fell. Pt did hit her head on the left side. Bleeding controlled. No LOC. Pt is on Plavix. Pt also reporting left hip pain.  Pt Aox4 VS with EMS  138/70 96% RA  20LAC

## 2022-03-01 NOTE — ED Notes (Signed)
Patient transported to CT 

## 2022-03-01 NOTE — ED Provider Notes (Signed)
[facility] EMERGENCY DEPARTMENT Provider Note   CSN: [id number] Arrival date & time: 03/01/22  0550     History  Chief Complaint  Patient presents with   Level 2 Fall on Thinners     INTISAR CLAUDIO is a 81 y.o. female.  The history is provided by the patient, the EMS personnel, medical records and a relative.  RUBYE STROHMEYER is a 81 y.o. female who presents to the Emergency Department complaining of fall.  She presents to the emergency department as a level 2 trauma alert following fall on thinners.  She has a history of prior CVA and takes Plavix as well as diabetes, hypertension, COPD.  She states that she was getting up out of her recliner and tripped with her walker and fell, striking her head.  She was able to call for assistance.  She complains of mild pain to her head as well as a burning feeling to her left hip.  No reports of recent illnesses.  No fevers, chest pain, shortness of breath, abdominal pain, nausea, vomiting.  She lives at home alone and ambulates with a walker.  No dysuria.     Home Medications Prior to Admission medications   Medication Sig Start Date End Date Taking? Authorizing Provider  acetaminophen (TYLENOL) 500 MG tablet Take 2 tablets (1,000 mg total) by mouth 3 (three) times daily as needed for mild pain, moderate pain or headache. 05/20/21   Nita Sells, MD  ALPRAZolam Duanne Moron) 0.25 MG tablet Take 1 tablet (0.25 mg total) by mouth daily as needed for anxiety. 08/18/21   Loletha Grayer, MD  aspirin EC 81 MG EC tablet Take 1 tablet (81 mg total) by mouth daily. Swallow whole. 05/21/21   Nita Sells, MD  atorvastatin (LIPITOR) 20 MG tablet Take 20 mg by mouth every Monday, Wednesday, and Friday.    [provider]  carvedilol (COREG) 6.25 MG tablet TAKE 1 TABLET BY MOUTH TWICE A DAY WITH MEALS 12/05/21   End, Harrell Gave, MD  clopidogrel (PLAVIX) 75 MG tablet Take 1 tablet (75 mg total) by mouth daily. 05/21/21    Nita Sells, MD  doxepin (SINEQUAN) 25 MG capsule Take 50-75 mg by mouth at bedtime. 04/01/20   [provider]  ferrous sulfate 325 (65 FE) MG tablet Take 1 tablet (325 mg total) by mouth daily. 08/18/21   Loletha Grayer, MD  furosemide (LASIX) 20 MG tablet TAKE ONE TABLET BY MOUTH DAILY 02/20/22   End, Harrell Gave, MD  gabapentin (NEURONTIN) 300 MG capsule Take 300 mg by mouth at bedtime. 02/18/20   [provider]  magnesium oxide (MAG-OX) 400 (240 Mg) MG tablet Take 1 tablet (400 mg total) by mouth daily. 08/18/21   Loletha Grayer, MD  metFORMIN (GLUCOPHAGE-XR) 500 MG 24 hr tablet Take 500 mg by mouth 2 (two) times daily. 03/08/20   [provider]  PARoxetine (PAXIL) 30 MG tablet Take 30 mg by mouth every morning. 03/08/20   [provider]      Allergies    Farxiga [dapagliflozin] and Sulfa antibiotics    Review of Systems   Review of Systems  All other systems reviewed and are negative.   Physical Exam Updated Vital Signs BP 138/63   Pulse 86   Temp 97.8 F (36.6 C) (Oral)   Resp 20   Ht 5' (1.524 m)   SpO2 94%   BMI 24.80 kg/m  Physical Exam Vitals and nursing note reviewed.  Constitutional:  Appearance: She is well-developed.  HENT:     Head: Normocephalic.     Comments: 2 cm laceration to the left temple with local soft tissue swelling.  There is active bleeding from this area. Cardiovascular:     Rate and Rhythm: Normal rate and regular rhythm.     Heart sounds: No murmur heard. Pulmonary:     Effort: Pulmonary effort is normal. No respiratory distress.     Breath sounds: Normal breath sounds.  Abdominal:     Palpations: Abdomen is soft.     Tenderness: There is no abdominal tenderness. There is no guarding or rebound.  Musculoskeletal:        General: No tenderness.     Comments: No midline C, T, L-spine tenderness.  Severe kyphosis.  There is a superficial abrasion over the left hip without any local  tenderness to palpation.  She is able to fully range the hip.  There is trace pitting edema to bilateral lower extremities with 2+ DP pulses bilaterally.  Skin:    General: Skin is warm and dry.  Neurological:     Mental Status: She is alert and oriented to person, place, and time.     Comments: 5 out of 5 strength in all 4 extremities.  Psychiatric:        Behavior: Behavior normal.     ED Results / Procedures / Treatments   Labs (all labs ordered are listed, but only abnormal results are displayed) Labs Reviewed  COMPREHENSIVE METABOLIC PANEL - Abnormal; Notable for the following components:      Result Value   Glucose, Bld 113 (*)    Calcium 8.7 (*)    Total Protein 6.0 (*)    Albumin 3.2 (*)    All other components within normal limits  CBC - Abnormal; Notable for the following components:   Hemoglobin 10.1 (*)    HCT 33.5 (*)    MCH 25.8 (*)    RDW 15.9 (*)    All other components within normal limits  I-STAT CHEM 8, ED - Abnormal; Notable for the following components:   Glucose, Bld 101 (*)    Calcium, Ion 1.03 (*)    Hemoglobin 10.9 (*)    HCT 32.0 (*)    All other components within normal limits  ETHANOL  LACTIC ACID, PLASMA  PROTIME-INR  URINALYSIS, ROUTINE W REFLEX MICROSCOPIC  SAMPLE TO BLOOD BANK    EKG None  Radiology CT Cervical Spine Wo Contrast  Result Date: 03/01/2022 CLINICAL DATA:  81 year old female status post fall on blood thinner. EXAM: CT CERVICAL SPINE WITHOUT CONTRAST TECHNIQUE: Multidetector CT imaging of the cervical spine was performed without intravenous contrast. Multiplanar CT image reconstructions were also generated. RADIATION DOSE REDUCTION: This exam was performed according to the departmental dose-optimization program which includes automated exposure control, adjustment of the mA and/or kV according to patient size and/or use of iterative reconstruction technique. COMPARISON:  Head CT today. Cervical and thoracic spine CT  09/02/2021. Thoracic spine radiographs 01/17/2022. FINDINGS: Alignment: Stable since April. Straightening of cervical lordosis with mild degenerative appearing anterolisthesis of C3 on C4. Chronic cervical facet arthropathy associated. Cervicothoracic junction alignment is within normal limits. Bilateral posterior element alignment is within normal limits. Skull base and vertebrae: Osteopenia. Visualized skull base is intact. No atlanto-occipital dissociation. C1 and C2 appears stable and intact. No acute osseous abnormality identified. Soft tissues and spinal canal: No prevertebral fluid or swelling. No visible canal hematoma. Motion artifact in the noncontrast neck. Chronic  retropharyngeal carotids with calcified atherosclerosis. Disc levels: Chronic cervical spine degeneration including C1-C2 joint space loss, left greater than right widespread facet arthropathy, advanced disc and endplate degeneration K1-S0 through C6-C7. Mild associated spinal stenosis suspected at C5-C6 and stable. Upper chest: Upper thoracic spine osteopenia. T3 superior endplate compression fracture with 30% loss of vertebral body height since the April CT. However, this was probably present on August thoracic spine radiographs (lateral view). No retropulsion. T3 pedicles and posterior elements appear intact. Visible T1, T2, and T4 levels appears stable and intact. Mild respiratory motion in the lung apices which are clear. Calcified aortic atherosclerosis. IMPRESSION: 1. No acute traumatic injury identified in the cervical spine. Stable cervical spine degeneration. 2. Osteopenia, and T3 superior endplate compression fracture with 30% loss of vertebral body height is new since April, age indeterminate, but was probably present on August radiographs. No retropulsion or complicating features. Electronically Signed   By: Genevie Ann M.D.   On: 03/01/2022 06:42   CT Head Wo Contrast  Result Date: 03/01/2022 CLINICAL DATA:  81 year old female  status post fall on blood thinner. EXAM: CT HEAD WITHOUT CONTRAST TECHNIQUE: Contiguous axial images were obtained from the base of the skull through the vertex without intravenous contrast. RADIATION DOSE REDUCTION: This exam was performed according to the departmental dose-optimization program which includes automated exposure control, adjustment of the mA and/or kV according to patient size and/or use of iterative reconstruction technique. COMPARISON:  Brain MRI 05/17/2021.  Head CT 09/02/2021. FINDINGS: Brain: Stable cerebral volume. No midline shift, ventriculomegaly, mass effect, evidence of mass lesion, intracranial hemorrhage or evidence of cortically based acute infarction. Patchy and confluent bilateral cerebral white matter hypodensity with some associated deep gray matter heterogeneity, and asymmetric involvement of the right internal capsule appears stable since March. Vascular: Calcified atherosclerosis at the skull base. No suspicious intracranial vascular hyperdensity. Skull: Osteopenia.  No acute osseous abnormality identified. Sinuses/Orbits: Scattered bilateral ethmoid and frontoethmoidal recess mucosal thickening and opacification has increased since April. No sinus fluid or hemorrhage level identified. Tympanic cavities and mastoids remain clear. Other: Left side lateral scalp convexity hematoma on series 4, image 43 measures up to 7 mm in thickness and is somewhat broad-based. There is trace scalp soft tissue gas there. Underlying calvarium appears stable since April and intact. Other scalp and orbits soft tissues appears stable. IMPRESSION: 1. Left side scalp hematoma and possible laceration. No underlying skull fracture. 2. No acute intracranial abnormality. Stable since April non contrast CT appearance of chronic small vessel disease. Electronically Signed   By: Genevie Ann M.D.   On: 03/01/2022 06:35   DG Pelvis Portable  Result Date: 03/01/2022 CLINICAL DATA:  81 year old female status  post fall on blood thinner. EXAM: PORTABLE PELVIS 1-2 VIEWS COMPARISON:  CT Abdomen and Pelvis 09/02/2021. Pelvis radiograph 05/10/2020. FINDINGS: Portable AP supine view at 0601 hours. Femoral heads remain normally located. Proximal femurs appear grossly stable and intact. Pelvis appears stable and intact, with decreased sacral detail today due to increased large bowel retained stool. Partially visible lumbar scoliosis and degeneration. Nonobstructed visible bowel gas pattern. Similar bladder distension to last year. Calcified iliofemoral atherosclerosis. IMPRESSION: No acute fracture or dislocation identified about the pelvis. Electronically Signed   By: Genevie Ann M.D.   On: 03/01/2022 06:30   DG Chest Portable 1 View  Result Date: 03/01/2022 CLINICAL DATA:  Level 2 trauma.  Fall on blood thinners. EXAM: PORTABLE CHEST 1 VIEW COMPARISON:  08/17/2021 FINDINGS: 0600 hours. Low volume film. The  cardio pericardial silhouette is enlarged. Atelectasis or infiltrate noted at the left lung base. Right lung clear. Interstitial markings are diffusely coarsened with chronic features. Bones are diffusely demineralized. Old left rib fracture evident. Telemetry leads overlie the chest. IMPRESSION: Left base atelectasis or infiltrate. Underlying chronic interstitial changes. Electronically Signed   By: Misty Stanley M.D.   On: 03/01/2022 06:29    Procedures .Marland KitchenLaceration Repair  Date/Time: 03/01/2022 7:31 AM  Performed by: Quintella Reichert, MD Authorized by: Quintella Reichert, MD   Consent:    Consent obtained:  Verbal   Consent given by:  Patient   Risks discussed:  Infection, pain and poor cosmetic result Universal protocol:    Patient identity confirmed:  Verbally with patient Anesthesia:    Anesthesia method:  Local infiltration   Local anesthetic:  Lidocaine 2% WITH epi Laceration details:    Location:  Face   Face location:  Forehead   Length (cm):  2 Treatment:    Area cleansed with:  Povidone-iodine  and saline   Amount of cleaning:  Standard   Debridement:  None Skin repair:    Repair method:  Sutures   Suture size:  5-0   Suture material:  Prolene   Suture technique:  Simple interrupted   Number of sutures:  2 Approximation:    Approximation:  Close Repair type:    Repair type:  Simple Post-procedure details:    Procedure completion:  Tolerated well, no immediate complications     Medications Ordered in ED Medications  lidocaine-EPINEPHrine (XYLOCAINE W/EPI) 2 %-1:200000 (PF) injection (has no administration in time range)    ED Course/ Medical Decision Making/ A&P                           Medical Decision Making Amount and/or Complexity of Data Reviewed Labs: ordered. Radiology: ordered.   Patient here as a level 2 trauma alert following trip and fall.  She does have a laceration to the left forehead/temple.  Wound repaired per note.  CT head is negative for acute intracranial abnormality.  CT C-spine with possible old T3 compression fracture.  Patient was not aware of this.  Patient does not have any current pain in her thoracic back.  She does have a remote history of lower thoracic compression fractures.  Do not feel that this is acute but did discuss the finding with the patient.  Chest x-ray with possible atelectasis versus infiltrate, images personally reviewed and interpreted-favor atelectasis as patient does not currently have respiratory symptoms.  She is able to ambulate with a steady gait with walker.  Feel she is stable to discharge home with outpatient follow-up. CBC with stable anemia.       Final Clinical Impression(s) / ED Diagnoses Final diagnoses:  Fall, initial encounter  Facial laceration, initial encounter  Contusion of left hip, initial encounter    Rx / DC Orders ED Discharge Orders     None         Quintella Reichert, MD 03/01/22 [phone]

## 2022-03-01 NOTE — Discharge Instructions (Addendum)
You had sutures placed in the Emergency Department today that will need to be removed in the next 5-7 days.

## 2022-03-20 ENCOUNTER — Encounter (INDEPENDENT_AMBULATORY_CARE_PROVIDER_SITE_OTHER): Payer: Self-pay

## 2022-04-11 ENCOUNTER — Other Ambulatory Visit: Payer: Self-pay

## 2022-04-11 MED ORDER — CARVEDILOL 6.25 MG PO TABS: 6.2500 mg | ORAL_TABLET | Freq: Two times a day (BID) | ORAL | 2 refills | Status: DC

## 2022-05-13 ENCOUNTER — Other Ambulatory Visit (HOSPITAL_COMMUNITY): Payer: Self-pay

## 2022-05-13 ENCOUNTER — Emergency Department (HOSPITAL_COMMUNITY): Payer: Medicare Other

## 2022-05-13 ENCOUNTER — Emergency Department (HOSPITAL_COMMUNITY)
Admission: EM | Admit: 2022-05-13 | Discharge: 2022-05-13 | Disposition: A | Discharge status: Home / Self Care | Payer: Medicare Other | Attending: Emergency Medicine | Admitting: Emergency Medicine

## 2022-05-13 DIAGNOSIS — Y92009 Unspecified place in unspecified non-institutional (private) residence as the place of occurrence of the external cause: Secondary | ICD-10-CM | POA: Insufficient documentation

## 2022-05-13 DIAGNOSIS — S0181XA Laceration without foreign body of other part of head, initial encounter: Secondary | ICD-10-CM | POA: Diagnosis not present

## 2022-05-13 DIAGNOSIS — Z7902 Long term (current) use of antithrombotics/antiplatelets: Secondary | ICD-10-CM | POA: Insufficient documentation

## 2022-05-13 DIAGNOSIS — Z7982 Long term (current) use of aspirin: Secondary | ICD-10-CM | POA: Insufficient documentation

## 2022-05-13 DIAGNOSIS — S0990XA Unspecified injury of head, initial encounter: Secondary | ICD-10-CM | POA: Diagnosis present

## 2022-05-13 DIAGNOSIS — W19XXXA Unspecified fall, initial encounter: Secondary | ICD-10-CM

## 2022-05-13 DIAGNOSIS — W228XXA Striking against or struck by other objects, initial encounter: Secondary | ICD-10-CM | POA: Diagnosis not present

## 2022-05-13 MED ORDER — LIDOCAINE-EPINEPHRINE (PF) 2 %-1:200000 IJ SOLN
20.0000 mL | Freq: Once | INTRAMUSCULAR | Status: AC
  Administered 2022-05-13: 20 mL
  Filled 2022-05-13: qty 20

## 2022-05-13 NOTE — ED Notes (Signed)
Trauma Response Nurse Documentation   Marissa Fletcher is a 81 y.o. female arriving to [facility] ED via EMS  On clopidogrel 75 mg daily. Trauma was activated as a Level 2 by Charge RN based on the following trauma criteria Elderly patients > 65 with head trauma on anti-coagulation (excluding ASA). Trauma team at the bedside on patient arrival.   Patient cleared for CT by Dr. Ralene Bathe. GCS 15.  History   Past Medical History:  Diagnosis Date   Anxiety    Aortic atherosclerosis ([place])    Arthritis    Cardiomyopathy ([place])    a. 04/2021 Echo: EF 35-40%, glob HK. GrII DD.  Nl RV size/fxn. Mod dil LA. Mild MR.   Carotid arterial disease ([place])    a. 04/2021 CTA Head/neck: RICA 60, LICA 55.   COPD (chronic obstructive pulmonary disease) (HCC)    Depression    Diabetes mellitus without complication ([place])    History of kidney stones    Hypertension    Lung cancer ([place])    Stroke (cerebrum) ([place])    a. 04/2021 MRI brain: Acute infarct post limb of R internal capsule.   Thoracic aortic aneurysm ([place])    Tobacco abuse      Past Surgical History:  Procedure Laterality Date   ABDOMINAL HYSTERECTOMY     CHOLECYSTECTOMY     EYE SURGERY Bilateral    IR KYPHO THORACIC WITH BONE BIOPSY  11/24/2021   IR RADIOLOGIST EVAL & MGMT  12/13/2021   KYPHOPLASTY N/A 07/30/2020   Procedure: T10 Kyphoplasty;  Surgeon: Hessie Knows, MD;  Location: [facility] ORS;  Service: Orthopedics;  Laterality: N/A;  T10   KYPHOPLASTY N/A 08/19/2020   Procedure: T9 YPHOPLASTY;  Surgeon: Hessie Knows, MD;  Location: [facility] ORS;  Service: Orthopedics;  Laterality: N/A;       Initial Focused Assessment (If applicable, or please see trauma documentation): Airway- clear Breathing- unlabored Circulation-- bleeding from laceration on left forehead/above eye-- has a dressing placed by EMS- saturated with blood on arrival-  GCS-- 15  CT's Completed:   CT Head and CT C-Spine   Interventions:  Xrays Labs CT scans Wound care  Plan  for disposition:  Discharge home         Bedside handoff with ED RN Marissa Guess, RN.    Marissa Fletcher  Trauma Response RN  Please call TRN at [phone] for further assistance.

## 2022-05-13 NOTE — ED Provider Notes (Signed)
[facility] EMERGENCY DEPARTMENT Provider Note   CSN: [id number] Arrival date & time: 05/13/22  0734     History  Chief Complaint  Patient presents with   Fall    Lac to left forehead, on blood thinners    Marissa Fletcher is a 81 y.o. female.  The history is provided by the patient, the EMS personnel and medical records.  Marissa Fletcher is a 81 y.o. female who presents to the Emergency Department complaining of fall.  She presents to the emergency department by EMS from home.  She takes Plavix and slipped while she was getting up this morning and struck her head on the coffee table.  No loss of consciousness.  She has pain to her forehead.  No reports of recent illnesses.  She reports chronic lower extremity edema and this is improved from her baseline.  No fever, chest pain, shortness of breath, abdominal pain, nausea, vomiting.     Home Medications Prior to Admission medications   Medication Sig Start Date End Date Taking? Authorizing Provider  PARoxetine (PAXIL) 30 MG tablet Take 30 mg by mouth every morning. 03/08/20  Yes [provider]  acetaminophen (TYLENOL) 500 MG tablet Take 2 tablets (1,000 mg total) by mouth 3 (three) times daily as needed for mild pain, moderate pain or headache. 05/20/21   Nita Sells, MD  ALPRAZolam Duanne Moron) 0.25 MG tablet Take 1 tablet (0.25 mg total) by mouth daily as needed for anxiety. 08/18/21   Loletha Grayer, MD  aspirin EC 81 MG EC tablet Take 1 tablet (81 mg total) by mouth daily. Swallow whole. 05/21/21   Nita Sells, MD  atorvastatin (LIPITOR) 20 MG tablet Take 20 mg by mouth every Monday, Wednesday, and Friday.    [provider]  carvedilol (COREG) 6.25 MG tablet Take 1 tablet (6.25 mg total) by mouth 2 (two) times daily with a meal. 04/11/22   End, Harrell Gave, MD  clopidogrel (PLAVIX) 75 MG tablet Take 1 tablet (75 mg total) by mouth daily. 05/21/21   Nita Sells, MD   doxepin (SINEQUAN) 25 MG capsule Take 50-75 mg by mouth at bedtime. 04/01/20   [provider]  ferrous sulfate 325 (65 FE) MG tablet Take 1 tablet (325 mg total) by mouth daily. 08/18/21   Loletha Grayer, MD  furosemide (LASIX) 20 MG tablet TAKE ONE TABLET BY MOUTH DAILY 02/20/22   End, Harrell Gave, MD  gabapentin (NEURONTIN) 300 MG capsule Take 300 mg by mouth at bedtime. 02/18/20   [provider]  magnesium oxide (MAG-OX) 400 (240 Mg) MG tablet Take 1 tablet (400 mg total) by mouth daily. 08/18/21   Loletha Grayer, MD  metFORMIN (GLUCOPHAGE-XR) 500 MG 24 hr tablet Take 500 mg by mouth 2 (two) times daily. 03/08/20   [provider]      Allergies    Farxiga [dapagliflozin] and Sulfa antibiotics    Review of Systems   Review of Systems  All other systems reviewed and are negative.   Physical Exam Updated Vital Signs BP 138/75   Pulse (!) 102   Temp (!) 96.3 F (35.7 C) (Axillary)   Resp (!) 21   Ht 5\' 4"  (1.626 m)   Wt 57.6 kg   SpO2 92%   BMI 21.80 kg/m  Physical Exam Vitals and nursing note reviewed.  Constitutional:      Appearance: She is well-developed.  HENT:     Head: Normocephalic.     Comments: There is a large  laceration to the left upper forehead. Eyes:     Extraocular Movements: Extraocular movements intact.     Pupils: Pupils are equal, round, and reactive to light.  Cardiovascular:     Rate and Rhythm: Normal rate and regular rhythm.     Heart sounds: No murmur heard. Pulmonary:     Effort: Pulmonary effort is normal. No respiratory distress.     Breath sounds: Normal breath sounds.     Comments: Kyphosis Abdominal:     Palpations: Abdomen is soft.     Tenderness: There is no abdominal tenderness. There is no guarding or rebound.  Musculoskeletal:        General: No tenderness.     Comments: Trace edema to BLE  Skin:    General: Skin is warm and dry.  Neurological:     Mental Status: She is alert and oriented to  person, place, and time.  Psychiatric:        Behavior: Behavior normal.     ED Results / Procedures / Treatments   Labs (all labs ordered are listed, but only abnormal results are displayed) Labs Reviewed - No data to display  EKG None  Radiology CT Head Wo Contrast  Result Date: 05/13/2022 CLINICAL DATA:  Minor head trauma EXAM: CT HEAD WITHOUT CONTRAST CT CERVICAL SPINE WITHOUT CONTRAST TECHNIQUE: Multidetector CT imaging of the head and cervical spine was performed following the standard protocol without intravenous contrast. Multiplanar CT image reconstructions of the cervical spine were also generated. RADIATION DOSE REDUCTION: This exam was performed according to the departmental dose-optimization program which includes automated exposure control, adjustment of the mA and/or kV according to patient size and/or use of iterative reconstruction technique. COMPARISON:  03/01/2022 FINDINGS: CT HEAD FINDINGS Brain: No evidence of acute infarction, hemorrhage, hydrocephalus, extra-axial collection or mass lesion/mass effect. Vascular: No hyperdense vessel or unexpected calcification. Skull: Anterior leftward scalp laceration with soiled bandage. No calvarial fracture or opaque foreign body. Sinuses/Orbits: No visible injury. Bilateral cataract resection and optic drusen. CT CERVICAL SPINE FINDINGS Alignment: No traumatic malalignment Skull base and vertebrae: No acute fracture Soft tissues and spinal canal: No prevertebral fluid or swelling. No visible canal hematoma. Disc levels: Cervical spine degeneration asymmetrically affecting left-sided facets. Upper chest: No visible injury IMPRESSION: 1. No evidence of acute intracranial or cervical spine injury. 2. Scalp laceration without fracture. Electronically Signed   By: Jorje Guild M.D.   On: 05/13/2022 08:59   CT Cervical Spine Wo Contrast  Result Date: 05/13/2022 CLINICAL DATA:  Minor head trauma EXAM: CT HEAD WITHOUT CONTRAST CT  CERVICAL SPINE WITHOUT CONTRAST TECHNIQUE: Multidetector CT imaging of the head and cervical spine was performed following the standard protocol without intravenous contrast. Multiplanar CT image reconstructions of the cervical spine were also generated. RADIATION DOSE REDUCTION: This exam was performed according to the departmental dose-optimization program which includes automated exposure control, adjustment of the mA and/or kV according to patient size and/or use of iterative reconstruction technique. COMPARISON:  03/01/2022 FINDINGS: CT HEAD FINDINGS Brain: No evidence of acute infarction, hemorrhage, hydrocephalus, extra-axial collection or mass lesion/mass effect. Vascular: No hyperdense vessel or unexpected calcification. Skull: Anterior leftward scalp laceration with soiled bandage. No calvarial fracture or opaque foreign body. Sinuses/Orbits: No visible injury. Bilateral cataract resection and optic drusen. CT CERVICAL SPINE FINDINGS Alignment: No traumatic malalignment Skull base and vertebrae: No acute fracture Soft tissues and spinal canal: No prevertebral fluid or swelling. No visible canal hematoma. Disc levels: Cervical spine degeneration asymmetrically affecting left-sided  facets. Upper chest: No visible injury IMPRESSION: 1. No evidence of acute intracranial or cervical spine injury. 2. Scalp laceration without fracture. Electronically Signed   By: Jorje Guild M.D.   On: 05/13/2022 08:59   DG Chest Port 1 View  Result Date: 05/13/2022 CLINICAL DATA:  Fall on blood thinners EXAM: PORTABLE CHEST 1 VIEW COMPARISON:  03/01/2022 FINDINGS: Streaky density in the bilateral mid chest. Chronic cardiomegaly and aortic tortuosity. There is no edema, consolidation, effusion, or pneumothorax. Thoracic compression fractures with vertebral cement. IMPRESSION: 1. Bands of atelectasis in the bilateral mid chest. 2. Cardiomegaly. 3. No acute finding. Electronically Signed   By: Jorje Guild M.D.   On:  05/13/2022 08:32    Procedures Procedures    Medications Ordered in ED Medications  lidocaine-EPINEPHrine (XYLOCAINE W/EPI) 2 %-1:200000 (PF) injection 20 mL (20 mLs Infiltration Given by Other 05/13/22 8016)    ED Course/ Medical Decision Making/ A&P                           Medical Decision Making Amount and/or Complexity of Data Reviewed Radiology: ordered.  Risk Prescription drug management.   Patient here for evaluation following a slip and fall at home, struck her head.  She does take Plavix.  She is chronically ill-appearing on evaluation without acute distress.  She does have a large facial laceration that required repair.  Please see PA note for repair details.  No reports of recent illnesses.  She did have moderate amount of bleeding from her wound prior to closure.  On reassessment patient is feeling well, denies recent illnesses.  She does have mild tachycardia.  Discussed recommendation for laboratory assessment to evaluate for significant anemia or additional process that may have contributed to her fall and she declines at this time.  Family are at the bedside during this conversation.  Plan to discharge home with very close return precautions if she develops new or concerning symptoms.        Final Clinical Impression(s) / ED Diagnoses Final diagnoses:  Fall, initial encounter  Facial laceration, initial encounter    Rx / DC Orders ED Discharge Orders     None         Quintella Reichert, MD 05/13/22 2249

## 2022-05-13 NOTE — ED Provider Notes (Signed)
..  Laceration Repair  Date/Time: 05/13/2022 10:43 AM  Performed by: Prince Rome, PA-C Authorized by: Prince Rome, PA-C   Consent:    Consent obtained:  Verbal and written   Consent given by:  Patient   Risks, benefits, and alternatives were discussed: yes     Risks discussed:  Infection, pain, need for additional repair, poor cosmetic result, vascular damage and poor wound healing   Alternatives discussed:  Delayed treatment, observation and referral Universal protocol:    Procedure explained and questions answered to patient or proxy's satisfaction: yes     Patient identity confirmed:  Verbally with patient and arm band Anesthesia:    Anesthesia method:  Local infiltration Laceration details:    Location:  Face   Face location:  Forehead   Length (cm):  6   Depth (mm):  4 Pre-procedure details:    Preparation:  Patient was prepped and draped in usual sterile fashion and imaging obtained to evaluate for foreign bodies Exploration:    Hemostasis achieved with:  Direct pressure and epinephrine   Imaging obtained comment:  CT head   Imaging outcome: foreign body not noted     Wound exploration: wound explored through full range of motion and entire depth of wound visualized     Wound extent: no tendon damage and no underlying fracture   Treatment:    Area cleansed with:  Povidone-iodine   Amount of cleaning:  Extensive   Irrigation solution:  Sterile saline   Irrigation method:  Syringe Skin repair:    Repair method:  Sutures   Suture size:  5-0   Suture material:  Prolene   Suture technique:  Simple interrupted   Number of sutures:  11 Approximation:    Approximation:  Close Repair type:    Repair type:  Intermediate Post-procedure details:    Dressing:  Non-adherent dressing, bulky dressing and antibiotic ointment   Procedure completion:  Tolerated well, no immediate complications     Prince Rome, PA-C 39/03/00 1048    Quintella Reichert,  MD 05/14/22 [phone]

## 2022-05-13 NOTE — ED Notes (Signed)
Help get patient into a gown on the monitor patient is resting with nurse at bedside and call bell in reach

## 2022-05-13 NOTE — ED Notes (Signed)
X-ray at bedside

## 2022-05-13 NOTE — ED Notes (Signed)
This RN reviewed discharge instructions with patient and family member. Both verbalized understanding and denied any further questions. Pt wheeled by family member in wheelchair to exit. Family feels comfortable taking pt home.

## 2022-05-13 NOTE — ED Notes (Signed)
Pt typically ambulates with walker. Pt able to stand, states she was initially "woozy" and it subsided. Pt walked 3-steps and back to bed.

## 2022-05-13 NOTE — Progress Notes (Signed)
Orthopedic Tech Progress Note Patient Details:  Marissa Fletcher [birth year] [id number]  Patient ID: [name], female   DOB: 1941/05/14, 81 y.o.   MRN: [id number] Level II; not currently needed. Vernona Rieger 05/13/2022, 8:07 AM

## 2022-05-13 NOTE — ED Notes (Signed)
Patient transported to CT 

## 2022-05-17 NOTE — Progress Notes (Deleted)
Cardiology Clinic Note   Patient Name: Marissa Fletcher Date of Encounter: 05/17/2022  Primary Care Provider:  Dorothey Baseman, MD Primary Cardiologist:  Yvonne Kendall, MD  Patient Profile    Marissa Fletcher is a 81 y.o. 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   Past Medical History    Past Medical History:  Diagnosis Date   Anxiety    Aortic atherosclerosis (HCC)    Arthritis    Cardiomyopathy (HCC)    a. 04/2021 Echo: EF 35-40%, glob HK. GrII DD.  Nl RV size/fxn. Mod dil LA. Mild MR.   Carotid arterial disease (HCC)    a. 04/2021 CTA Head/neck: RICA 60, LICA 55.   COPD (chronic obstructive pulmonary disease) (HCC)    Depression    Diabetes mellitus without complication (HCC)    History of kidney stones    Hypertension    Lung cancer (HCC)    Stroke (cerebrum) (HCC)    a. 04/2021 MRI brain: Acute infarct post limb of R internal capsule.   Thoracic aortic aneurysm (HCC)    Tobacco abuse    Past Surgical History:  Procedure Laterality Date   ABDOMINAL HYSTERECTOMY     CHOLECYSTECTOMY     EYE SURGERY Bilateral    IR KYPHO THORACIC WITH BONE BIOPSY  11/24/2021   IR RADIOLOGIST EVAL & MGMT  12/13/2021   KYPHOPLASTY N/A 07/30/2020   Procedure: T10 Kyphoplasty;  Surgeon: Kennedy Bucker, MD;  Location: [facility] ORS;  Service: Orthopedics;  Laterality: N/A;  T10   KYPHOPLASTY N/A 08/19/2020   Procedure: T9 YPHOPLASTY;  Surgeon: Kennedy Bucker, MD;  Location: [facility] ORS;  Service: Orthopedics;  Laterality: N/A;    Allergies  Allergies  Allergen Reactions   Farxiga [Dapagliflozin] Other (See Comments)    Recurring yeast infection   Sulfa Antibiotics Swelling    History of Present  Illness    RON JUNCO has a past medical history of: Chronic diastolic and systolic heart failure.  CAD.  Coronary CT with FFR 06/23/2021: Significant stenosis mid RCA, no significant focal stenosis LCx. 4.1 cm descending thoracic aortic aneurysm.  Carotid artery disease.  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  Carotid US 12/06/2021: Bilateral ICA 1-39% stenosis.  Descending thoracic aortic aneurysm/mural thrombus. CTA Chest 05/17/2021: 4 cm fusiform aneurysm of the distal descending thoracic aorta. Extensive eccentric mural thrombus in the descending thoracic aorta. Hypertension.  Hyperlipidemia.  Lipid panel 11/15/2021: LDL 87, HDL 49, TG 84, total 153.  CVA. MR brain 05/17/2021: Acute infarct posterior limb of right internal capsule. Mild edema without mass effect. Moderate chronic microvascular ischemic disease.   Echo 05/17/2021: EF 35-40%. Global hypokinesis. Mild LVH. Grade II DD. Mild to moderately dilated left atrium. Mild MR.  14 day Zio 07/14/2021: 5 days and 1 hour analyzed secondary to significant artifact. Predominant rhythm was sinus. Rare PACs and PVCs. 5 atrial runs lasting 11 beats with max rate 164 bmp. No sustained arrhythmia or prolonged pauses. No evidence of of afib/aflutter.  History of lung CA. COPD.  T2DM.  Tobacco abuse.   Marissa Fletcher was first evaluated by cardiology on 05/19/2021 during hospitalization for CVA. During CVA workup patient's echo showed moderately  reduced LV function and grade 2 DD. CTA of head chest neck showed significant atherosclerosis and mural thrombus involving the distal arch of the aorta as well as moderate ICA stenosis. Patient was discharged on aspirin, Plavix, Farxiga and continued on Losartan. After discharge patient had a coronary CT which showed CAD in mid RCA and LCx. Dr. 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  He recommended GDMT and reserved LHC if no improvement.   Chronic diastolic and systolic heart failure. Echo December 2022 showed EF 35-40%, grade II DD. This was found during workup for CVA. Euvolemic and well compensated on exam. Weight *** Breath sounds *** Will repeat echo *** Continue carvedilol, lasix, farxiga? *** CAD. Coronary CT February 2023 showed CAD mid RCA and LCx. Patient denies *** Descending thoracic aortic aneurysm/mural thrombus. CTA December 2022 4 cm fusiform aneurysm of the distal descending thoracic aorta, extensive eccentric mural thrombus in descending thoracic aorta. *** Followed by vascular surgery.  Carotid artery disease. Carotid US July 2023 bilateral ICA 1-39%. Followed by vascular surgery.  Hypertension. BP today *** Patient denies *** Hyperlipidemia. LDL June 2023 87, not at goal. *** CVA. MR brain December 2022 showed acute infarct posterior limb of right internal capsule. Patient with left hemiparesis *** Tobacco abuse. ***  Home Medications    No outpatient medications have been marked as taking for the 05/19/22 encounter (Appointment) with Charlsie Quest, NP.    Family History    Family History  Problem Relation Age of Onset   Dementia Mother    Congestive Heart Failure Mother    Bladder Cancer Father    Heart disease Father    Breast cancer Neg Hx    She indicated that her mother is deceased. She indicated that her father is deceased. She indicated that the status of her neg hx is unknown.   Social History    Social History   Socioeconomic History   Marital status: Single    Spouse name: Not on file   Number of children: Not on file   Years of education: Not on file   Highest education level: Not on file  Occupational History   Not on file  Tobacco Use   Smoking status: Every Day    Packs/day: 0.50    Years: 68.00    Total pack years: 34.00    Types: Cigarettes   Smokeless tobacco: Never  Vaping Use   Vaping Use: Never used   Substance and Sexual Activity   Alcohol use: Never   Drug use: Never   Sexual activity: Not on file  Other Topics Concern   Not on file  Social History Narrative   Lives locally.  Does not routinely exercise.   Social Determinants of Health   Financial Resource Strain: Not on file  Food Insecurity: Not on file  Transportation Needs: Not on file  Physical Activity: Not on file  Stress: Not on file  Social Connections: Not on file  Intimate Partner Violence: Not on file     Review of Systems    General: *** No chills, fever, night sweats or weight changes.  Cardiovascular:  No chest pain, dyspnea on exertion, edema, orthopnea, palpitations, paroxysmal nocturnal dyspnea. Dermatological: No rash, lesions/masses Respiratory: No cough, dyspnea Urologic: No hematuria, dysuria Abdominal:   No nausea, vomiting, diarrhea, bright red blood per rectum, melena, or hematemesis Neurologic:  No visual changes, weakness, changes in mental status. All other systems reviewed and are otherwise  negative except as noted above.  Physical Exam    VS:  There were no vitals taken for this visit. , BMI There is no height or weight on file to calculate BMI. GEN: *** Well nourished, well developed, in no acute distress. HEENT: Normal. Neck: Supple, no JVD, carotid bruits, or masses. Cardiac: RRR, no murmurs, rubs, or gallops. No clubbing, cyanosis, edema.  Radials/DP/PT 2+ and equal bilaterally.  Respiratory:  Respirations regular and unlabored, clear to auscultation bilaterally. GI: Soft, nontender, nondistended. MS: No deformity or atrophy. Skin: Warm and dry, no rash. Neuro: Strength and sensation are intact. Psych: Normal affect.  Accessory Clinical Findings    The following studies were reviewed for this visit: ***  Recent Labs: 08/14/2021: B Natriuretic Peptide 361.1 09/02/2021: TSH 0.608 09/05/2021: Magnesium 1.9 03/01/2022: ALT 24; BUN 11; Creatinine, Ser 0.60; Hemoglobin 10.9;  Platelets 290; Potassium 3.9; Sodium 139   Recent Lipid Panel    Component Value Date/Time   CHOL 153 11/15/2021 1027   TRIG 84 11/15/2021 1027   HDL 49 11/15/2021 1027   CHOLHDL 3.1 11/15/2021 1027   VLDL 17 11/15/2021 1027   LDLCALC 87 11/15/2021 1027    No BP recorded.  {Refresh Note OR Click here to enter BP  :1}***    ECG personally reviewed by me today: ***  No significant changes from ***  {Does this patient have ATRIAL FIBRILLATION?[phone]}   Assessment & Plan   ***      Disposition: ***   Etta Grandchild. Aurora Rody, DNP, NP-C     05/17/2022, 2:13 PM [facility] [address] Office [phone] Fax [phone]

## 2022-05-19 ENCOUNTER — Emergency Department: Payer: Medicare Other

## 2022-05-19 ENCOUNTER — Encounter: Payer: Self-pay | Admitting: Radiology

## 2022-05-19 ENCOUNTER — Ambulatory Visit: Payer: Medicare Other | Admitting: Cardiology

## 2022-05-19 ENCOUNTER — Emergency Department
Admission: EM | Admit: 2022-05-19 | Discharge: 2022-05-20 | Disposition: A | Discharge status: Home / Self Care | Payer: Medicare Other | Attending: Emergency Medicine | Admitting: Emergency Medicine

## 2022-05-19 ENCOUNTER — Other Ambulatory Visit: Payer: Self-pay

## 2022-05-19 DIAGNOSIS — R410 Disorientation, unspecified: Secondary | ICD-10-CM | POA: Diagnosis not present

## 2022-05-19 DIAGNOSIS — I10 Essential (primary) hypertension: Secondary | ICD-10-CM | POA: Diagnosis not present

## 2022-05-19 DIAGNOSIS — N390 Urinary tract infection, site not specified: Secondary | ICD-10-CM | POA: Insufficient documentation

## 2022-05-19 DIAGNOSIS — J449 Chronic obstructive pulmonary disease, unspecified: Secondary | ICD-10-CM | POA: Insufficient documentation

## 2022-05-19 DIAGNOSIS — Z1152 Encounter for screening for COVID-19: Secondary | ICD-10-CM | POA: Insufficient documentation

## 2022-05-19 DIAGNOSIS — F0781 Postconcussional syndrome: Secondary | ICD-10-CM | POA: Insufficient documentation

## 2022-05-19 DIAGNOSIS — W19XXXA Unspecified fall, initial encounter: Secondary | ICD-10-CM

## 2022-05-19 DIAGNOSIS — E119 Type 2 diabetes mellitus without complications: Secondary | ICD-10-CM | POA: Diagnosis not present

## 2022-05-19 LAB — CBC WITH DIFFERENTIAL/PLATELET
Abs Immature Granulocytes: 0.02 10*3/uL (ref 0.00–0.07)
Basophils Absolute: 0 10*3/uL (ref 0.0–0.1)
Basophils Relative: 0 %
Eosinophils Absolute: 0.2 10*3/uL (ref 0.0–0.5)
Eosinophils Relative: 3 %
HCT: 30.9 % — ABNORMAL LOW (ref 36.0–46.0)
Hemoglobin: 8.8 g/dL — ABNORMAL LOW (ref 12.0–15.0)
Immature Granulocytes: 0 %
Lymphocytes Relative: 14 %
Lymphs Abs: 1.1 10*3/uL (ref 0.7–4.0)
MCH: 22.5 pg — ABNORMAL LOW (ref 26.0–34.0)
MCHC: 28.5 g/dL — ABNORMAL LOW (ref 30.0–36.0)
MCV: 79 fL — ABNORMAL LOW (ref 80.0–100.0)
Monocytes Absolute: 0.7 10*3/uL (ref 0.1–1.0)
Monocytes Relative: 10 %
Neutro Abs: 5.7 10*3/uL (ref 1.7–7.7)
Neutrophils Relative %: 73 %
Platelets: 367 10*3/uL (ref 150–400)
RBC: 3.91 MIL/uL (ref 3.87–5.11)
RDW: 18.1 % — ABNORMAL HIGH (ref 11.5–15.5)
WBC: 7.8 10*3/uL (ref 4.0–10.5)
nRBC: 0 % (ref 0.0–0.2)

## 2022-05-19 LAB — URINALYSIS, ROUTINE W REFLEX MICROSCOPIC
Bacteria, UA: NONE SEEN
Bilirubin Urine: NEGATIVE
Glucose, UA: NEGATIVE mg/dL
Hgb urine dipstick: NEGATIVE
Ketones, ur: 5 mg/dL — AB
Leukocytes,Ua: NEGATIVE
Nitrite: NEGATIVE
Protein, ur: 100 mg/dL — AB
Specific Gravity, Urine: 1.028 (ref 1.005–1.030)
pH: 5 (ref 5.0–8.0)

## 2022-05-19 LAB — CK: Total CK: 145 U/L (ref 38–234)

## 2022-05-19 LAB — BASIC METABOLIC PANEL
Anion gap: 9 (ref 5–15)
BUN: 16 mg/dL (ref 8–23)
CO2: 25 mmol/L (ref 22–32)
Calcium: 8.3 mg/dL — ABNORMAL LOW (ref 8.9–10.3)
Chloride: 106 mmol/L (ref 98–111)
Creatinine, Ser: 0.72 mg/dL (ref 0.44–1.00)
GFR, Estimated: >60 mL/min (ref >60)
Glucose, Bld: 127 mg/dL — ABNORMAL HIGH (ref 70–99)
Potassium: 3 mmol/L — ABNORMAL LOW (ref 3.5–5.1)
Sodium: 140 mmol/L (ref 135–145)

## 2022-05-19 LAB — RESP PANEL BY RT-PCR (RSV, FLU A&B, COVID)  RVPGX2
Influenza A by PCR: NEGATIVE
Influenza B by PCR: NEGATIVE
Resp Syncytial Virus by PCR: NEGATIVE
SARS Coronavirus 2 by RT PCR: NEGATIVE

## 2022-05-19 LAB — TROPONIN I (HIGH SENSITIVITY)
Troponin I (High Sensitivity): 25 ng/L — ABNORMAL HIGH (ref <18)
Troponin I (High Sensitivity): 51 ng/L — ABNORMAL HIGH (ref <18)

## 2022-05-19 LAB — BRAIN NATRIURETIC PEPTIDE: B Natriuretic Peptide: 231.6 pg/mL — ABNORMAL HIGH (ref 0.0–100.0)

## 2022-05-19 MED ORDER — NYSTATIN 100000 UNIT/GM EX POWD: 1.0000 | Freq: Three times a day (TID) | CUTANEOUS | 0 refills | Status: DC

## 2022-05-19 MED ORDER — IPRATROPIUM-ALBUTEROL 0.5-2.5 (3) MG/3ML IN SOLN
6.0000 mL | Freq: Once | RESPIRATORY_TRACT | Status: AC
  Administered 2022-05-19: 6 mL via RESPIRATORY_TRACT
  Filled 2022-05-19: qty 3

## 2022-05-19 MED ORDER — POTASSIUM CHLORIDE 20 MEQ PO PACK
40.0000 meq | PACK | ORAL | Status: AC
  Administered 2022-05-19: 40 meq via ORAL
  Filled 2022-05-19: qty 2

## 2022-05-19 NOTE — ED Provider Notes (Signed)
-----------------------------------------   8:45 PM on 05/19/2022 ----------------------------------------- Patient care assumed from Dr. Joni Fears.  Patient's urinalysis does not appear to show any urinary tract infection.  I spoke to the patient as well as the patient's daughter who is here with there.  They stated patient has had a fall every 4 to 5 months over the past year since she had a stroke back in December of this past year leading to left lower extremity weakness.  However over the past 5 days the patient has now had 3 falls.  The daughter states twice she is walked into the room and the patient has been undressed on the floor seemingly confused and not knowing what was going on.  Patient was seen in [facility] where she was found to have a thoracic spine fracture which is also seen today in our CT C-spine she appears to have T2-3 and 4 fractures.  Patient's troponin went from 25-51 although the patient denies any chest pain.  CBC shows anemia but not significant change from recent.  Chemistry shows no concerning finding.  COVID flu and RSV are negative.  CT scan shows a subacute fractures of T2-T4 no other acute abnormalities.  No chest abnormalities.  MRI is negative for acute infarct.  Patient did have sutures to her left forehead, 11 that needed to be removed I went ahead and remove the patient's sutures for her.  Patient also states she is developed a rash over the past several weeks under both of her breast, this is consistent with a Candida infection we will prescribe nystatin topical powder as well.  I discussed with the patient using Tylenol as needed for discomfort.  Patient agreeable to plan.   Harvest Dark, MD 05/19/22 [phone]

## 2022-05-19 NOTE — ED Provider Triage Note (Signed)
Emergency Medicine Provider Triage Evaluation Note  Marissa Fletcher , a 81 y.o. female  was evaluated in triage.  Pt complains of urinary urgency and confusion this week according to daughter. Patient had low O2 when EMS arrived and was put on O2. She fell Saturday and went to [facility] and got stitches (supposed to removed today). On Wednesday she had another fall, found laying in the ground without clothes on and again yesterday. Unclear how long she was on the floor for. History oc COPD, still smoking.  Hypoxic to 84 in triage, put on 2L [place]  Review of Systems  Positive: urgency Negative: Abd pain  Physical Exam  There were no vitals taken for this visit. Gen:   Awake, no distress   Resp:  Normal effort, hypoxic and on 2l [place]  MSK:   Moves extremities without difficulty  Other:    Medical Decision Making  Medically screening exam initiated at 12:51 PM.  Appropriate orders placed.  Marissa Fletcher was informed that the remainder of the evaluation will be completed by another provider, this initial triage assessment does not replace that evaluation, and the importance of remaining in the ED until their evaluation is complete.     Marquette Old, PA-C 05/19/22 1257

## 2022-05-19 NOTE — ED Notes (Signed)
Troponin of 51. MD Joni Fears made aware.

## 2022-05-19 NOTE — ED Provider Notes (Signed)
[facility] Provider Note    Event Date/Time   First MD Initiated Contact with Patient 05/19/22 1745     (approximate)   History   Chief Complaint: UTI   HPI  Marissa Fletcher is a 81 y.o. female with a history of hypertension diabetes kidney stones COPD who comes the ED due to intermittent confusion.  She seemed to have a hard time getting herself ready to leave the house this morning for medical appointments and got frustrated.  Denies chest pain shortness of breath fever dizziness or syncope.  Currently seems back to normal.  She is worried she might have a UTI. She did recently have a fall with head trauma.  Sustained a scalp laceration.  Denies any worsening headache or neurologic symptoms.      Physical Exam   Triage Vital Signs: ED Triage Vitals  Enc Vitals Group     BP 05/19/22 1253 (!) 101/48     Pulse Rate 05/19/22 1253 (!) 101     Resp 05/19/22 1300 (!) 22     Temp 05/19/22 1253 98.7 F (37.1 C)     Temp Source 05/19/22 1253 Oral     SpO2 05/19/22 1253 97 %     Weight --      Height --      Head Circumference --      Peak Flow --      Pain Score 05/19/22 1253 0     Pain Loc --      Pain Edu? --      Excl. in [place]? --     Most recent vital signs: Vitals:   05/19/22 1645 05/19/22 1800  BP: 129/89 (!) 156/65  Pulse: 93 94  Resp: 18 18  Temp: 98 F (36.7 C) 98.2 F (36.8 C)  SpO2: 99% 98%    General: Awake, no distress.  CV:  Good peripheral perfusion.  Regular rate and rhythm Resp:  Normal effort.  Symmetric air movement.  Slight expiratory wheezing. Abd:  No distention.  Soft nontender Other:  Dry mucous membranes   ED Results / Procedures / Treatments   Labs (all labs ordered are listed, but only abnormal results are displayed) Labs Reviewed  CBC WITH DIFFERENTIAL/PLATELET - Abnormal; Notable for the following components:      Result Value   Hemoglobin 8.8 (*)    HCT 30.9 (*)    MCV 79.0 (*)    MCH 22.5 (*)     MCHC 28.5 (*)    RDW 18.1 (*)    All other components within normal limits  BASIC METABOLIC PANEL - Abnormal; Notable for the following components:   Potassium 3.0 (*)    Glucose, Bld 127 (*)    Calcium 8.3 (*)    All other components within normal limits  BRAIN NATRIURETIC PEPTIDE - Abnormal; Notable for the following components:   B Natriuretic Peptide 231.6 (*)    All other components within normal limits  TROPONIN I (HIGH SENSITIVITY) - Abnormal; Notable for the following components:   Troponin I (High Sensitivity) 25 (*)    All other components within normal limits  TROPONIN I (HIGH SENSITIVITY) - Abnormal; Notable for the following components:   Troponin I (High Sensitivity) 51 (*)    All other components within normal limits  RESP PANEL BY RT-PCR (RSV, FLU A&B, COVID)  RVPGX2  CK  URINALYSIS, ROUTINE W REFLEX MICROSCOPIC     EKG Interpreted by me Sinus rhythm rate of 100.  Normal axis, first-degree AV block.  Poor R wave progression.  Normal ST segments and T waves.  No ischemic changes.  1 PVC on the strip.   RADIOLOGY Chest x-ray interpreted by me, appears normal.  Radiology report reviewed   PROCEDURES:  Procedures   MEDICATIONS ORDERED IN ED: Medications  ipratropium-albuterol (DUONEB) 0.5-2.5 (3) MG/3ML nebulizer solution 6 mL (6 mLs Nebulization Given 05/19/22 1818)     IMPRESSION / MDM / ASSESSMENT AND PLAN / ED COURSE  I reviewed the triage vital signs and the nursing notes.                              Differential diagnosis includes, but is not limited to, postconcussive syndrome, subdural hematoma, anemia, AKI, electrolyte abnormality, viral illness, UTI, pneumonia  Patient's presentation is most consistent with acute presentation with potential threat to life or bodily function.  Patient brought to the ED due to a spell of confusion this morning.  She seems to be back to normal.  Oxygenation is about 95% on room air which she states is her  baseline.  She has mild wheezing due to her underlying COPD.  Will give DuoNebs for comfort.  Viral swabs negative, serum labs unremarkable except for mild hypokalemia.  Will obtain urinalysis and plan for discharge home with/without antibiotics as indicated.       FINAL CLINICAL IMPRESSION(S) / ED DIAGNOSES   Final diagnoses:  Post concussion syndrome     Rx / DC Orders   ED Discharge Orders     None        Note:  This document was prepared using Dragon voice recognition software and may include unintentional dictation errors.   Carrie Mew, MD 05/19/22 1930

## 2022-05-19 NOTE — ED Triage Notes (Signed)
Pt comes via EMS from home with c/o UTI. Family reports confusion. Pt denies any belly pain. Pt states she feels she isn't going as much as she normally does. Pt denies any pain or pressure or burning.

## 2022-05-19 NOTE — ED Notes (Addendum)
Pt was 84 on room air was put on 2l of oxygen pt is now 97%

## 2022-05-21 LAB — BLOOD GAS, VENOUS
Acid-Base Excess: 4 mmol/L — ABNORMAL HIGH (ref 0.0–2.0)
Bicarbonate: 29.7 mmol/L — ABNORMAL HIGH (ref 20.0–28.0)
O2 Saturation: 21.9 %
Patient temperature: 37
pCO2, Ven: 48 mmHg (ref 44–60)
pH, Ven: 7.4 (ref 7.25–7.43)

## 2022-06-05 ENCOUNTER — Ambulatory Visit
Admission: RE | Admit: 2022-06-05 | Discharge: 2022-06-05 | Disposition: A | Discharge status: Home / Self Care | Payer: Medicare Other | Source: Ambulatory Visit | Attending: Vascular Surgery | Admitting: Vascular Surgery

## 2022-06-05 DIAGNOSIS — I7143 Infrarenal abdominal aortic aneurysm, without rupture: Secondary | ICD-10-CM | POA: Diagnosis not present

## 2022-06-05 MED ORDER — IOHEXOL 350 MG/ML SOLN
100.0000 mL | Freq: Once | INTRAVENOUS | Status: AC | PRN
  Administered 2022-06-05: 100 mL via INTRAVENOUS

## 2022-06-08 NOTE — Progress Notes (Signed)
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  Past Medical History    Past Medical History:  Diagnosis Date   Anxiety    Aortic atherosclerosis ([place])    Arthritis    Cardiomyopathy ([place])    a. 04/2021 Echo: EF 35-40%, glob HK. GrII DD.  Nl RV size/fxn. Mod dil LA. Mild MR.   Carotid arterial disease ([place])    a. 04/2021 CTA Head/neck: RICA 60, LICA 55.   COPD (chronic obstructive pulmonary disease) ([facility])    Depression    Diabetes mellitus without complication ([place])    History of kidney stones    Hypertension    Lung cancer ([place])    Stroke (cerebrum) ([place])    a. 04/2021 MRI brain: Acute infarct post limb of R internal capsule.   Thoracic aortic aneurysm ([place])    Tobacco abuse    Past Surgical History:  Procedure Laterality Date   ABDOMINAL HYSTERECTOMY     CHOLECYSTECTOMY     EYE SURGERY Bilateral    IR KYPHO THORACIC WITH BONE BIOPSY  11/24/2021   IR RADIOLOGIST EVAL & MGMT  12/13/2021   KYPHOPLASTY N/A 07/30/2020   Procedure: T10 Kyphoplasty;  Surgeon: Hessie Knows, MD;  Location: [facility] ORS;  Service: Orthopedics;  Laterality: N/A;  T10   KYPHOPLASTY N/A 08/19/2020   Procedure: T9 YPHOPLASTY;  Surgeon: Hessie Knows, MD;  Location: [facility] ORS;  Service: Orthopedics;  Laterality: N/A;    Allergies  Allergies  Allergen Reactions   Farxiga [Dapagliflozin] Other (See Comments)    Recurring yeast infection   Sulfa Antibiotics Swelling    History of Present Illness    Marissa Fletcher is a 82 year old female with the previously mentioned past  medical history.  She had previously been admitted to the hospital on December 2020 for acute CVA.  CT of the neck.  Nonobstructive carotid artery disease and extensive atherosclerosis with mural thrombus involving the aorta.  Echocardiogram showed LVEF 35-40% with global hypokinesis, grade 2 diastolic dysfunction.  She was initiated on low-dose GDMT and discharged with ambulatory cardiac monitoring.  TEE was not pursued on the recommendation of neurology.  Monitor showed rare PACs and PVCs as well as brief episode of PSVT.  There was no A-fib/a flutter identified.  Lower extremity duplex was negative for DVT.  Coronary CTA 06/23/2021 to assess for ischemic heart disease demonstrated severe single-vessel coronary artery disease with greater than 75% stenosis in the mid RCA that was significant by CT FFR. It was felt that the single vessel CAD did not entirely explain reduced EF, did not feel she was a good cath candidate given high risk for embolic phenomenon and she was continued with aggressive medical therapy.   Last seen in clinic 12/08/2021 and states she was doing fairly well.  She continued to walk with referrals later.  At that time she had been on her aspirin and clopidogrel as she was getting ready to undergo kyphoplasty for previous fracture she had sustained sustained.  There were no changes made to her medication further testing that was ordered at that time.  Since she was  last seen in clinic she was sent to visits to the emergency department status post mechanical falls and one visit for UTI. None of her visits required hospitalization.  She returns to clinic today accompanied by a family member with concerns of increased peripheral edema, weeping areas, and recent abscess to a toe on the left foot. She is currently on antibiotic therapy for the abscess which looks like a cellulitis. She does endorse several mechanical falls which required stitches on two separate occasions. Her back pain has  improved since her recent kyphoplasty. Denies any current chest pain or worsening shortness of breath.   Home Medications    Current Outpatient Medications  Medication Sig Dispense Refill   acetaminophen (TYLENOL) 500 MG tablet Take 2 tablets (1,000 mg total) by mouth 3 (three) times daily as needed for mild pain, moderate pain or headache. 30 tablet 0   ALPRAZolam (XANAX) 0.25 MG tablet Take 1 tablet (0.25 mg total) by mouth daily as needed for anxiety. 10 tablet 0   amoxicillin-clavulanate (AUGMENTIN) 875-125 MG tablet 1 tablet 2 (two) times daily.     aspirin EC 81 MG EC tablet Take 1 tablet (81 mg total) by mouth daily. Swallow whole. 30 tablet 11   atorvastatin (LIPITOR) 20 MG tablet Take 20 mg by mouth every Monday, Wednesday, and Friday.     carvedilol (COREG) 6.25 MG tablet Take 1 tablet (6.25 mg total) by mouth 2 (two) times daily with a meal. 60 tablet 2   clopidogrel (PLAVIX) 75 MG tablet Take 1 tablet (75 mg total) by mouth daily. 30 tablet 12   doxepin (SINEQUAN) 25 MG capsule Take 50-75 mg by mouth at bedtime.     furosemide (LASIX) 20 MG tablet TAKE ONE TABLET BY MOUTH DAILY 30 tablet 4   gabapentin (NEURONTIN) 300 MG capsule Take 300 mg by mouth at bedtime. 200-300 mg when needed     magnesium oxide (MAG-OX) 400 (240 Mg) MG tablet Take 1 tablet (400 mg total) by mouth daily. 30 tablet 0   metFORMIN (GLUCOPHAGE-XR) 500 MG 24 hr tablet Take 500 mg by mouth 2 (two) times daily.     nystatin (MYCOSTATIN/NYSTOP) powder Apply 1 Application topically 3 (three) times daily. 15 g 0   PARoxetine (PAXIL) 30 MG tablet Take 30 mg by mouth every morning.     ferrous sulfate 325 (65 FE) MG tablet Take 1 tablet (325 mg total) by mouth daily. (Patient not taking: Reported on 06/09/2022) 30 tablet 3   No current facility-administered medications for this visit.     Family History    Family History  Problem Relation Age of Onset   Dementia Mother    Congestive Heart Failure Mother     Bladder Cancer Father    Heart disease Father    Breast cancer Neg Hx    She indicated that her mother is deceased. She indicated that her father is deceased. She indicated that the status of her neg hx is unknown.  Social History    Social History   Socioeconomic History   Marital status: Single    Spouse name: Not on file   Number of children: Not on file   Years of education: Not on file   Highest education level: Not on file  Occupational History   Not on file  Tobacco Use   Smoking status: Every Day    Packs/day: 0.50    Years: 68.00    Total pack years: 34.00    Types: Cigarettes  Smokeless tobacco: Never  Vaping Use   Vaping Use: Never used  Substance and Sexual Activity   Alcohol use: Never   Drug use: Never   Sexual activity: Not on file  Other Topics Concern   Not on file  Social History Narrative   Lives locally.  Does not routinely exercise.   Social Determinants of Health   Financial Resource Strain: Not on file  Food Insecurity: Not on file  Transportation Needs: Not on file  Physical Activity: Not on file  Stress: Not on file  Social Connections: Not on file  Intimate Partner Violence: Not on file     Review of Systems    General:  No chills, fever, night sweats or weight changes. Endorses fatigue Cardiovascular:  No chest pain, dyspnea on exertion, endorses worsening peripheral edema, orthopnea, palpitations, paroxysmal nocturnal dyspnea. Dermatological: No rash, lesions/masses Respiratory: No cough, dyspnea Urologic: No hematuria, dysuria Abdominal:   No nausea, vomiting, diarrhea, bright red blood per rectum, melena, or hematemesis Neurologic:  No visual changes, wkns, changes in mental status. All other systems reviewed and are otherwise negative except as noted above.   Physical Exam    VS:  BP (!) 140/80 (BP Location: Left Arm, Patient Position: Sitting, Cuff Size: Normal)   Pulse 90   Ht 5\' 4"  (1.626 m)   Wt 136 lb 12.8 oz (62.1  kg)   SpO2 92%   BMI 23.48 kg/m  , BMI Body mass index is 23.48 kg/m.     GEN: Well nourished, well developed, in no acute distress. HEENT: normal. Neck: Supple, no JVD, carotid bruits, or masses. Cardiac: RRR, no murmurs, rubs, or gallops. No clubbing, cyanosis, 2+ edema to the bilateral lower extremities with L>G, weeping areas noted to the right lower extremity, recent removal of a callous to the left toe which revealed an abscess underneath, dressing intact not removed to assess area today, warmth and redness concerning for cellulitis.  Radials 2+/PT 2+ and equal bilaterally.  Respiratory:  Respirations regular and unlabored, clear to auscultation bilaterally. GI: Soft, nontender, nondistended, BS + x 4. MS: no deformity or atrophy. Skin: warm and dry, no rash, dressing to recent abscess to left toe, leg with orange peel skin, warmth and redness concerning for cellulitis Neuro:  Strength and sensation are intact. Psych: Normal affect.  Accessory Clinical Findings    ECG personally reviewed by me today- sinus rate of 90, first degree AVB, LVH, left axis deviation, nonspecific ST/T wave abnormalities - No acute changes  Lab Results  Component Value Date   WBC 7.8 05/19/2022   HGB 8.8 (L) 05/19/2022   HCT 30.9 (L) 05/19/2022   MCV 79.0 (L) 05/19/2022   PLT 367 05/19/2022   Lab Results  Component Value Date   CREATININE 0.72 05/19/2022   BUN 16 05/19/2022   NA 140 05/19/2022   K 3.0 (L) 05/19/2022   CL 106 05/19/2022   CO2 25 05/19/2022   Lab Results  Component Value Date   ALT 24 03/01/2022   AST 30 03/01/2022   ALKPHOS 114 03/01/2022   BILITOT 0.3 03/01/2022   Lab Results  Component Value Date   CHOL 153 11/15/2021   HDL 49 11/15/2021   LDLCALC 87 11/15/2021   TRIG 84 11/15/2021   CHOLHDL 3.1 11/15/2021    Lab Results  Component Value Date   HGBA1C 6.2 (H) 05/18/2021    Assessment & Plan   1.  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  She states it is worsened over the last 3 weeks even though she has been continued on furosemide 20 mg daily.   She is unable to continue with Farxiga with recurrent yeast infections.  Previously had been on losartan but that is discontinued and the patient is not sure who or when her losartan had been stopped.  She has been continued on carvedilol 6.25 mg twice daily and furosemide 20 mg daily.  Also following up on her BNP today.  Will request most recent medication list from pharmacy will try to continue to escalate GDMT as tolerated.  2.  Coronary artery disease remains stable close patient does not report any angina.  Given history of stroke in 04/2019 with extensive atherosclerotic plaque throughout the aortic arch catheterization was deferred due to risk for periprocedural stroke or other emboli lifelong moderate catheterization.  She has been continued on aspirin 81 mg daily, atorvastatin 20 mg on Monday Wednesday Friday, clopidogrel 75 mg daily.  3.  Hyperlipidemia with an LDL of 87 11/15/2021.  Patient remains on atorvastatin 20 mg daily 3 times a week.  This continues to be managed by her PCP  4.  History of CVA with noted neurological deficits.  Continue aggressive secondary prevention.  She is continued on Plavix and aspirin.  5.  Peripheral edema where she recently followed up with her podiatrist with concern of a callus on her toe but when the  callus was removed it revealed an abscess.  The left lower extremity is concerning for pitting edema, redness and warmth for cellulitis but she is currently on antibiotic therapy previously prescribed by podiatry.  With weeping to her right lower extremity as well she has been referred to pulm clinic for unna boots.  6.  Hypokalemia with recent potassium level of 3.0 during her last emergency department visit 05/19/2022.  She states that she was given a dose of potassium today  but has not had any repeat blood work.  She also stated that previously she been on potassium supplementations but those were stopped a while back.  She has been sent today for repeat BMP to follow-up on her potassium and kidney function.  7.  Disposition patient return to clinic to see MD/APP in 6 weeks or sooner if needed.  Jaidyn Kuhl, NP 06/09/2022, 2:33 PM

## 2022-06-09 ENCOUNTER — Ambulatory Visit: Payer: Medicare Other | Attending: Cardiology | Admitting: Cardiology

## 2022-06-09 ENCOUNTER — Encounter: Payer: Self-pay | Admitting: Cardiology

## 2022-06-09 ENCOUNTER — Other Ambulatory Visit
Admission: RE | Admit: 2022-06-09 | Discharge: 2022-06-09 | Disposition: A | Discharge status: Home / Self Care | Payer: Medicare Other | Attending: Cardiology | Admitting: Cardiology

## 2022-06-09 VITALS — BP 140/80 | HR 90 | Ht 64.0 in | Wt 136.8 lb

## 2022-06-09 DIAGNOSIS — I5022 Chronic systolic (congestive) heart failure: Secondary | ICD-10-CM

## 2022-06-09 DIAGNOSIS — I639 Cerebral infarction, unspecified: Secondary | ICD-10-CM

## 2022-06-09 DIAGNOSIS — I2510 Atherosclerotic heart disease of native coronary artery without angina pectoris: Secondary | ICD-10-CM

## 2022-06-09 DIAGNOSIS — R600 Localized edema: Secondary | ICD-10-CM

## 2022-06-09 DIAGNOSIS — E1169 Type 2 diabetes mellitus with other specified complication: Secondary | ICD-10-CM | POA: Diagnosis present

## 2022-06-09 DIAGNOSIS — R609 Edema, unspecified: Secondary | ICD-10-CM | POA: Insufficient documentation

## 2022-06-09 DIAGNOSIS — I700 Atherosclerosis of aorta: Secondary | ICD-10-CM

## 2022-06-09 DIAGNOSIS — E876 Hypokalemia: Secondary | ICD-10-CM | POA: Diagnosis present

## 2022-06-09 DIAGNOSIS — Z79899 Other long term (current) drug therapy: Secondary | ICD-10-CM | POA: Insufficient documentation

## 2022-06-09 DIAGNOSIS — E785 Hyperlipidemia, unspecified: Secondary | ICD-10-CM | POA: Insufficient documentation

## 2022-06-09 LAB — BASIC METABOLIC PANEL
Anion gap: 7 (ref 5–15)
BUN: 12 mg/dL (ref 8–23)
CO2: 27 mmol/L (ref 22–32)
Calcium: 8.5 mg/dL — ABNORMAL LOW (ref 8.9–10.3)
Chloride: 103 mmol/L (ref 98–111)
Creatinine, Ser: 0.59 mg/dL (ref 0.44–1.00)
GFR, Estimated: >60 mL/min (ref >60)
Glucose, Bld: 109 mg/dL — ABNORMAL HIGH (ref 70–99)
Potassium: 4.2 mmol/L (ref 3.5–5.1)
Sodium: 137 mmol/L (ref 135–145)

## 2022-06-09 LAB — BRAIN NATRIURETIC PEPTIDE: B Natriuretic Peptide: 92.9 pg/mL (ref 0.0–100.0)

## 2022-06-09 NOTE — Patient Instructions (Signed)
Referral placed to Wound Care clinic for unna boots. They will give you a call to get set up. Their number is [phone].   Medication Instructions:  No changes at this time.   *If you need a refill on your cardiac medications before your next appointment, please call your pharmacy*   Lab Work: BMP & BNP today over at the [facility]. Just check in at registration desk.   If you have labs (blood work) drawn today and your tests are completely normal, you will receive your results only by: MyChart Message (if you have MyChart) OR A paper copy in the mail If you have any lab test that is abnormal or we need to change your treatment, we will call you to review the results.   Testing/Procedures: None   Follow-Up: At [facility], you and your health needs are our priority.  As part of our continuing mission to provide you with exceptional heart care, we have created designated Provider Care Teams.  These Care Teams include your primary Cardiologist (physician) and Advanced Practice Providers (APPs -  Physician Assistants and Nurse Practitioners) who all work together to provide you with the care you need, when you need it.   Your next appointment:   2 month(s)  Provider:   Yvonne Kendall, MD or Charlsie Quest, NP

## 2022-06-12 NOTE — Progress Notes (Signed)
Blood work is all reassuring. Heart failure marker is normal. No electrolyte concerns showing. Continue current medication regimen.

## 2022-06-13 ENCOUNTER — Ambulatory Visit (INDEPENDENT_AMBULATORY_CARE_PROVIDER_SITE_OTHER): Payer: Medicare Other

## 2022-06-13 ENCOUNTER — Ambulatory Visit (INDEPENDENT_AMBULATORY_CARE_PROVIDER_SITE_OTHER): Payer: Medicare Other | Admitting: Vascular Surgery

## 2022-06-13 ENCOUNTER — Encounter (INDEPENDENT_AMBULATORY_CARE_PROVIDER_SITE_OTHER): Payer: Self-pay | Admitting: Vascular Surgery

## 2022-06-13 VITALS — BP 138/76 | HR 96 | Resp 15

## 2022-06-13 DIAGNOSIS — I6523 Occlusion and stenosis of bilateral carotid arteries: Secondary | ICD-10-CM

## 2022-06-13 DIAGNOSIS — I7143 Infrarenal abdominal aortic aneurysm, without rupture: Secondary | ICD-10-CM | POA: Diagnosis not present

## 2022-06-13 DIAGNOSIS — R160 Hepatomegaly, not elsewhere classified: Secondary | ICD-10-CM

## 2022-06-13 DIAGNOSIS — I10 Essential (primary) hypertension: Secondary | ICD-10-CM

## 2022-06-13 DIAGNOSIS — E1169 Type 2 diabetes mellitus with other specified complication: Secondary | ICD-10-CM

## 2022-06-13 DIAGNOSIS — E785 Hyperlipidemia, unspecified: Secondary | ICD-10-CM

## 2022-06-13 DIAGNOSIS — C221 Intrahepatic bile duct carcinoma: Secondary | ICD-10-CM | POA: Insufficient documentation

## 2022-06-13 DIAGNOSIS — I7025 Atherosclerosis of native arteries of other extremities with ulceration: Secondary | ICD-10-CM

## 2022-06-13 NOTE — Assessment & Plan Note (Signed)
Recent CT scan showed this to be stable at 3.4 cm in maximal diameter.  Recheck in 1 year with duplex.

## 2022-06-13 NOTE — Assessment & Plan Note (Signed)
Unclear etiology.  I am going to refer to Dr. Servando Snare for further evaluation.

## 2022-06-13 NOTE — Assessment & Plan Note (Signed)
Patient is nonhealing ulceration on her left foot that is a critical and limb threatening situation.  We can get her in the near future to check her lower extremity perfusion and make sure is adequate for wound healing.  I discussed the serious nature of the situation with the patient and her family and they agree with evaluating her perfusion.

## 2022-06-13 NOTE — Assessment & Plan Note (Signed)
blood glucose control important in reducing the progression of atherosclerotic disease. Also, involved in wound healing. On appropriate medications.  

## 2022-06-13 NOTE — Progress Notes (Signed)
MRN : [id number]  Marissa Fletcher is a 82 y.o. ([birth year]) female who presents with chief complaint of  Chief Complaint  Patient presents with   Follow-up    Ultrasound and ct results  .  History of Present Illness: Patient returns today in follow up of multiple vascular issues.  She recently underwent a CT scan of the abdomen pelvis which I have independently reviewed to help follow-up for aneurysmal disease.  Her aneurysm is stable at about 3.4 cm in maximal diameter.  There was an incidental finding of hepatic mass on this scan was concerning for possible abscess or neoplastic process. She is also followed for carotid disease.  No recent focal neurologic symptoms. Specifically, the patient denies amaurosis fugax, speech or swallowing difficulties, or arm or leg weakness or numbness. Carotid duplex today reveals stable 1 to 39% ICA stenosis bilaterally without progression from previous studies. A new and concerning symptom is also of a nonhealing ulceration on her left foot associated with swelling in a patient with multiple other atherosclerotic issues.  She follows with podiatry.  She has not had her perfusion checked of the lower extremities to her knowledge.  Current Outpatient Medications  Medication Sig Dispense Refill   acetaminophen (TYLENOL) 500 MG tablet Take 2 tablets (1,000 mg total) by mouth 3 (three) times daily as needed for mild pain, moderate pain or headache. 30 tablet 0   ALPRAZolam (XANAX) 0.25 MG tablet Take 1 tablet (0.25 mg total) by mouth daily as needed for anxiety. 10 tablet 0   amoxicillin-clavulanate (AUGMENTIN) 875-125 MG tablet 1 tablet 2 (two) times daily.     aspirin EC 81 MG EC tablet Take 1 tablet (81 mg total) by mouth daily. Swallow whole. 30 tablet 11   atorvastatin (LIPITOR) 20 MG tablet Take 20 mg by mouth every Monday, Wednesday, and Friday.     carvedilol (COREG) 6.25 MG tablet Take 1 tablet (6.25 mg total) by mouth 2 (two) times daily with a meal.  60 tablet 2   clopidogrel (PLAVIX) 75 MG tablet Take 1 tablet (75 mg total) by mouth daily. 30 tablet 12   doxepin (SINEQUAN) 25 MG capsule Take 50-75 mg by mouth at bedtime.     furosemide (LASIX) 20 MG tablet TAKE ONE TABLET BY MOUTH DAILY 30 tablet 4   gabapentin (NEURONTIN) 300 MG capsule Take 300 mg by mouth at bedtime. 200-300 mg when needed     magnesium oxide (MAG-OX) 400 (240 Mg) MG tablet Take 1 tablet (400 mg total) by mouth daily. 30 tablet 0   metFORMIN (GLUCOPHAGE-XR) 500 MG 24 hr tablet Take 500 mg by mouth 2 (two) times daily.     nystatin (MYCOSTATIN/NYSTOP) powder Apply 1 Application topically 3 (three) times daily. 15 g 0   PARoxetine (PAXIL) 30 MG tablet Take 30 mg by mouth every morning.     ferrous sulfate 325 (65 FE) MG tablet Take 1 tablet (325 mg total) by mouth daily. (Patient not taking: Reported on 06/09/2022) 30 tablet 3   No current facility-administered medications for this visit.    Past Medical History:  Diagnosis Date   Anxiety    Aortic atherosclerosis (HCC)    Arthritis    Cardiomyopathy (HCC)    a. 04/2021 Echo: EF 35-40%, glob HK. GrII DD.  Nl RV size/fxn. Mod dil LA. Mild MR.   Carotid arterial disease (HCC)    a. 04/2021 CTA Head/neck: RICA 60, LICA 55.   COPD (chronic obstructive pulmonary disease) (HCC)  Depression    Diabetes mellitus without complication ([place])    History of kidney stones    Hypertension    Lung cancer ([place])    Stroke (cerebrum) ([place])    a. 04/2021 MRI brain: Acute infarct post limb of R internal capsule.   Thoracic aortic aneurysm ([place])    Tobacco abuse     Past Surgical History:  Procedure Laterality Date   ABDOMINAL HYSTERECTOMY     CHOLECYSTECTOMY     EYE SURGERY Bilateral    IR KYPHO THORACIC WITH BONE BIOPSY  11/24/2021   IR RADIOLOGIST EVAL & MGMT  12/13/2021   KYPHOPLASTY N/A 07/30/2020   Procedure: T10 Kyphoplasty;  Surgeon: Hessie Knows, MD;  Location: [facility] ORS;  Service: Orthopedics;  Laterality: N/A;  T10    KYPHOPLASTY N/A 08/19/2020   Procedure: T9 YPHOPLASTY;  Surgeon: Hessie Knows, MD;  Location: [facility] ORS;  Service: Orthopedics;  Laterality: N/A;     Social History   Tobacco Use   Smoking status: Every Day    Packs/day: 0.50    Years: 68.00    Total pack years: 34.00    Types: Cigarettes   Smokeless tobacco: Never  Vaping Use   Vaping Use: Never used  Substance Use Topics   Alcohol use: Never   Drug use: Never      Family History  Problem Relation Age of Onset   Dementia Mother    Congestive Heart Failure Mother    Bladder Cancer Father    Heart disease Father    Breast cancer Neg Hx      Allergies  Allergen Reactions   Farxiga [Dapagliflozin] Other (See Comments)    Recurring yeast infection   Sulfa Antibiotics Swelling    REVIEW OF SYSTEMS (Negative unless checked)   Constitutional: [] Weight loss  [] Fever  [] Chills Cardiac: [] Chest pain   [] Chest pressure   [] Palpitations   [] Shortness of breath when laying flat   [] Shortness of breath at rest   [x] Shortness of breath with exertion. Vascular:  [] Pain in legs with walking   [] Pain in legs at rest   [] Pain in legs when laying flat   [] Claudication   [] Pain in feet when walking  [] Pain in feet at rest  [] Pain in feet when laying flat   [] History of DVT   [] Phlebitis   [] Swelling in legs   [] Varicose veins   [] Non-healing ulcers Pulmonary:   [] Uses home oxygen   [] Productive cough   [] Hemoptysis   [] Wheeze  [x] COPD   [] Asthma Neurologic:  [] Dizziness  [] Blackouts   [] Seizures   [x] History of stroke   [] History of TIA  [] Aphasia   [] Temporary blindness   [] Dysphagia   [] Weakness or numbness in arms   [] Weakness or numbness in legs Musculoskeletal:  [x] Arthritis   [] Joint swelling   [] Joint pain   [x] Low back pain Hematologic:  [] Easy bruising  [] Easy bleeding   [] Hypercoagulable state   [] Anemic  [] Hepatitis Gastrointestinal:  [] Blood in stool   [] Vomiting blood  [x] Gastroesophageal reflux/heartburn   [] Abdominal  pain Genitourinary:  [] Chronic kidney disease   [] Difficult urination  [] Frequent urination  [] Burning with urination   [] Hematuria Skin:  [] Rashes   [] Ulcers   [] Wounds Psychological:  [x] History of anxiety   []  History of major depression.  Physical Examination  BP 138/76 (BP Location: Left Arm)   Pulse 96   Resp 15  Gen:  WD/WN, NAD.debilitated appearing Head: [place]/AT, No temporalis wasting. Ear/Nose/Throat: Hearing grossly intact, nares w/o erythema or drainage Eyes:  Conjunctiva clear. Sclera non-icteric Neck: Supple.  Trachea midline Pulmonary:  Good air movement, no use of accessory muscles.  Cardiac: irregular Vascular:  Vessel Right Left  Radial Palpable Palpable                          PT Not Palpable Not Palpable  DP 1+ Palpable Not Palpable   Gastrointestinal: soft, non-tender/non-distended. No guarding/reflex.  Musculoskeletal: M/S 5/5 throughout.  No deformity or atrophy. 1+ RLE edema, 2+ LLE edema. Neurologic: Sensation grossly intact in extremities.  Symmetrical.  Speech is fluent.  Psychiatric: Judgment intact, Mood & affect appropriate for pt's clinical situation. Dermatologic: left foot wound currently dressed.      Labs Recent Results (from the past 2160 hour(s))  CBC with Differential     Status: Abnormal   Collection Time: 05/19/22 12:59 PM  Result Value Ref Range   WBC 7.8 4.0 - 10.5 K/uL   RBC 3.91 3.87 - 5.11 MIL/uL   Hemoglobin 8.8 (L) 12.0 - 15.0 g/dL   HCT 86.9 (L) 22.9 - 40.8 %   MCV 79.0 (L) 80.0 - 100.0 fL   MCH 22.5 (L) 26.0 - 34.0 pg   MCHC 28.5 (L) 30.0 - 36.0 g/dL   RDW 23.6 (H) 69.8 - 02.8 %   Platelets 367 150 - 400 K/uL   nRBC 0.0 0.0 - 0.2 %   Neutrophils Relative % 73 %   Neutro Abs 5.7 1.7 - 7.7 K/uL   Lymphocytes Relative 14 %   Lymphs Abs 1.1 0.7 - 4.0 K/uL   Monocytes Relative 10 %   Monocytes Absolute 0.7 0.1 - 1.0 K/uL   Eosinophils Relative 3 %   Eosinophils Absolute 0.2 0.0 - 0.5 K/uL   Basophils Relative 0 %    Basophils Absolute 0.0 0.0 - 0.1 K/uL   Immature Granulocytes 0 %   Abs Immature Granulocytes 0.02 0.00 - 0.07 K/uL    Comment: Performed at [facility], [address] [place], [place] [address]  Basic metabolic panel     Status: Abnormal   Collection Time: 05/19/22 12:59 PM  Result Value Ref Range   Sodium 140 135 - 145 mmol/L   Potassium 3.0 (L) 3.5 - 5.1 mmol/L   Chloride 106 98 - 111 mmol/L   CO2 25 22 - 32 mmol/L   Glucose, Bld 127 (H) 70 - 99 mg/dL    Comment: Glucose reference range applies only to samples taken after fasting for at least 8 hours.   BUN 16 8 - 23 mg/dL   Creatinine, Ser 3.65 0.44 - 1.00 mg/dL   Calcium 8.3 (L) 8.9 - 10.3 mg/dL   GFR, Estimated >78 >42 mL/min    Comment: (NOTE) Calculated using the CKD-EPI Creatinine Equation (2021)    Anion gap 9 5 - 15    Comment: Performed at [facility], [address] [place], [place] [address]  Troponin I (High Sensitivity)     Status: Abnormal   Collection Time: 05/19/22 12:59 PM  Result Value Ref Range   Troponin I (High Sensitivity) 25 (H) <18 ng/L    Comment: (NOTE) Elevated high sensitivity troponin I (hsTnI) values and significant  changes across serial measurements may suggest ACS but many other  chronic and acute conditions are known to elevate hsTnI results.  Refer to the "Links" section for chest pain algorithms and additional  guidance. Performed at [facility], [address] [place], [place] [address]  Urinalysis, Routine w reflex microscopic Urine, Clean Catch     Status: Abnormal   Collection Time: 05/19/22 12:59 PM  Result Value Ref Range   Color, Urine AMBER (A) YELLOW    Comment: BIOCHEMICALS MAY BE AFFECTED BY COLOR   APPearance CLOUDY (A) CLEAR   Specific Gravity, Urine 1.028 1.005 - 1.030   pH 5.0 5.0 - 8.0   Glucose, UA NEGATIVE NEGATIVE mg/dL   Hgb urine dipstick NEGATIVE NEGATIVE   Bilirubin Urine NEGATIVE NEGATIVE   Ketones, ur 5 (A) NEGATIVE  mg/dL   Protein, ur 100 (A) NEGATIVE mg/dL   Nitrite NEGATIVE NEGATIVE   Leukocytes,Ua NEGATIVE NEGATIVE   RBC / HPF 0-5 0 - 5 RBC/hpf   WBC, UA 6-10 0 - 5 WBC/hpf   Bacteria, UA NONE SEEN NONE SEEN   Squamous Epithelial / HPF 11-20 0 - 5 /HPF   Mucus PRESENT    Hyaline Casts, UA PRESENT     Comment: Performed at [facility], [address] [place], [place] [address]  Brain natriuretic peptide     Status: Abnormal   Collection Time: 05/19/22 12:59 PM  Result Value Ref Range   B Natriuretic Peptide 231.6 (H) 0.0 - 100.0 pg/mL    Comment: Performed at [facility], [address] [place], [place] [address]  Resp panel by RT-PCR (RSV, Flu A&B, Covid) Anterior Nasal Swab     Status: None   Collection Time: 05/19/22 12:59 PM   Specimen: Anterior Nasal Swab  Result Value Ref Range   SARS Coronavirus 2 by RT PCR NEGATIVE NEGATIVE    Comment: (NOTE) SARS-CoV-2 target nucleic acids are NOT DETECTED.  The SARS-CoV-2 RNA is generally detectable in upper respiratory specimens during the acute phase of infection. The lowest concentration of SARS-CoV-2 viral copies this assay can detect is 138 copies/mL. A negative result does not preclude SARS-Cov-2 infection and should not be used as the sole basis for treatment or other patient management decisions. A negative result may occur with  improper specimen collection/handling, submission of specimen other than nasopharyngeal swab, presence of viral mutation(s) within the areas targeted by this assay, and inadequate number of viral copies(<138 copies/mL). A negative result must be combined with clinical observations, patient history, and epidemiological information. The expected result is Negative.  Fact Sheet for Patients:  EntrepreneurPulse.com.au  Fact Sheet for Healthcare Providers:  IncredibleEmployment.be  This test is no t yet approved or cleared by the [place] FDA and   has been authorized for detection and/or diagnosis of SARS-CoV-2 by FDA under an Emergency Use Authorization (EUA). This EUA will remain  in effect (meaning this test can be used) for the duration of the COVID-19 declaration under Section 564(b)(1) of the Act, 21 U.S.C.section 360bbb-3(b)(1), unless the authorization is terminated  or revoked sooner.       Influenza A by PCR NEGATIVE NEGATIVE   Influenza B by PCR NEGATIVE NEGATIVE    Comment: (NOTE) The Xpert Xpress SARS-CoV-2/FLU/RSV plus assay is intended as an aid in the diagnosis of influenza from Nasopharyngeal swab specimens and should not be used as a sole basis for treatment. Nasal washings and aspirates are unacceptable for Xpert Xpress SARS-CoV-2/FLU/RSV testing.  Fact Sheet for Patients: EntrepreneurPulse.com.au  Fact Sheet for Healthcare Providers: IncredibleEmployment.be  This test is not yet approved or cleared by the [place] FDA and has been authorized for detection and/or diagnosis of SARS-CoV-2 by FDA under an Emergency Use Authorization (EUA). This EUA will remain in effect (  meaning this test can be used) for the duration of the COVID-19 declaration under Section 564(b)(1) of the Act, 21 U.S.C. section 360bbb-3(b)(1), unless the authorization is terminated or revoked.     Resp Syncytial Virus by PCR NEGATIVE NEGATIVE    Comment: (NOTE) Fact Sheet for Patients: BloggerCourse.com  Fact Sheet for Healthcare Providers: SeriousBroker.it  This test is not yet approved or cleared by the [place] FDA and has been authorized for detection and/or diagnosis of SARS-CoV-2 by FDA under an Emergency Use Authorization (EUA). This EUA will remain in effect (meaning this test can be used) for the duration of the COVID-19 declaration under Section 564(b)(1) of the Act, 21 U.S.C. section 360bbb-3(b)(1), unless the  authorization is terminated or revoked.  Performed at [facility], [address] [place], [place] [address]   CK     Status: None   Collection Time: 05/19/22 12:59 PM  Result Value Ref Range   Total CK 145 38 - 234 U/L    Comment: Performed at [facility], [address] [place], [place] [address]  Troponin I (High Sensitivity)     Status: Abnormal   Collection Time: 05/19/22  2:55 PM  Result Value Ref Range   Troponin I (High Sensitivity) 51 (H) <18 ng/L    Comment: READ BACK AND VERIFIED WITH ASHLEY ELLWINGER 05/19/22 @ 1744 BY SB (NOTE) Elevated high sensitivity troponin I (hsTnI) values and significant  changes across serial measurements may suggest ACS but many other  chronic and acute conditions are known to elevate hsTnI results.  Refer to the "Links" section for chest pain algorithms and additional  guidance. Performed at [facility], [address] [place], [place] [address]   Blood gas, venous     Status: Abnormal   Collection Time: 05/19/22  9:29 PM  Result Value Ref Range   pH, Ven 7.4 7.25 - 7.43   pCO2, Ven 48 44 - 60 mmHg   Bicarbonate 29.7 (H) 20.0 - 28.0 mmol/L   Acid-Base Excess 4.0 (H) 0.0 - 2.0 mmol/L   O2 Saturation 21.9 %   Patient temperature 37.0    Collection site VEIN     Comment: Performed at [facility], [address] [place], [place] [address]  B Nat Peptide     Status: None   Collection Time: 06/09/22  3:19 PM  Result Value Ref Range   B Natriuretic Peptide 92.9 0.0 - 100.0 pg/mL    Comment: Performed at [facility], [address] [place], [place] [address]  Basic metabolic panel     Status: Abnormal   Collection Time: 06/09/22  3:19 PM  Result Value Ref Range   Sodium 137 135 - 145 mmol/L   Potassium 4.2 3.5 - 5.1 mmol/L   Chloride 103 98 - 111 mmol/L   CO2 27 22 - 32 mmol/L   Glucose, Bld 109 (H) 70 - 99 mg/dL    Comment: Glucose reference range applies only to samples taken  after fasting for at least 8 hours.   BUN 12 8 - 23 mg/dL   Creatinine, Ser 2.88 0.44 - 1.00 mg/dL   Calcium 8.5 (L) 8.9 - 10.3 mg/dL   GFR, Estimated >05 >59 mL/min    Comment: (NOTE) Calculated using the CKD-EPI Creatinine Equation (2021)    Anion gap 7 5 - 15    Comment: Performed at [facility], [address], [place], [place] [address]    Radiology CT Angio Abd/Pel w/  and/or w/o  Result Date: 06/06/2022 CLINICAL DATA:  Follow-up abdominal aortic aneurysm. EXAM: CTA ABDOMEN AND PELVIS WITHOUT AND WITH CONTRAST TECHNIQUE: Multidetector CT imaging of the abdomen and pelvis was performed using the standard protocol during bolus administration of intravenous contrast. Multiplanar reconstructed images and MIPs were obtained and reviewed to evaluate the vascular anatomy. RADIATION DOSE REDUCTION: This exam was performed according to the departmental dose-optimization program which includes automated exposure control, adjustment of the mA and/or kV according to patient size and/or use of iterative reconstruction technique. CONTRAST:  OMNIPAQUE IOHEXOL 350 MG/ML SOLN COMPARISON:  09/02/2021 FINDINGS: VASCULAR Aorta: Distal descending thoracic aorta measures to 3.9 cm and stable. Infrarenal abdominal aorta is aneurysmal and has severe atherosclerotic disease with irregular mural thrombus and ulcerative plaque. Infrarenal abdominal aorta is slightly tortuous and makes it difficult to accurately measure. Maximum dimension of the infrarenal abdominal aorta is 3.5 cm on image 70/7 and stable based on the coronal images from the exam on 09/02/2021. Largest amount of irregular mural thrombus is in the infrarenal abdominal aorta. Distal abdominal aorta at the bifurcation is patent. Celiac: Celiac trunk is patent with at least mild stenosis at the origin. Periphery calcified vessel near the splenic hilum measuring roughly 0.7 cm. SMA: Patent with mild narrowing at the origin. No evidence for  aneurysm or dissection. Renals: Right renal artery is patent without significant stenosis. Main left renal artery is patent without significant stenosis. Small accessory left renal artery. IMA: Patent Inflow: Iliac arteries are heavily calcified bilaterally. No significant stenosis involving the common or external iliac arteries. Bilateral internal iliac arteries are patent. Proximal Outflow: Proximal femoral arteries are patent bilaterally but there is evidence for severe stenosis in the proximal right SFA. Prominent right profunda femoral artery branches. Veins: IVC and renal veins are patent. Review of the MIP images confirms the above findings. NON-VASCULAR Lower chest: Lung bases are clear. Hepatobiliary: There is a poorly defined hypodense area in the left hepatic lobe, segment 4 region. This is new from the prior examination and concerning for a hepatic mass measuring 5.2 x 4.4 cm on image 7/11. Mild intrahepatic biliary dilatation likely secondary to the cholecystectomy. No other definite hepatic lesions. Pancreas: Unremarkable. No pancreatic ductal dilatation or surrounding inflammatory changes. Spleen: Normal in size without focal abnormality. Adrenals/Urinary Tract: Fullness and low-density of the adrenal glands is similar to the previous examination. No suspicious renal lesions. No hydronephrosis. Normal appearance of the urinary bladder. Stomach/Bowel: Normal appearance of the stomach. Diverticulosis in the sigmoid colon. Pericolonic stranding with focal inflammatory changes just lateral to the left colon on image 25/11. This is new from the previous examination. Inflammation may be separate from the colon and could represent epiploic appendagitis but nonspecific. No evidence for bowel dilatation or obstruction. Lymphatic: No significant lymph node enlargement in the abdomen or pelvis. Reproductive: Hysterectomy. There may be residual right adnexal/ovarian tissue on image 54/11. No evidence for an  adnexal mass. Other: Negative for free air.  Negative for free fluid. Musculoskeletal: Chronic anterolisthesis of L4 on L5. Chronic disc space narrowing at L1-L2. Previous compression fracture at T11 with bone cement. IMPRESSION: Vascular 1. Infrarenal abdominal aortic aneurysm measuring up to 3.5 cm and not significantly changed since 09/02/2021. Recommend follow-up ultrasound every 2 years. This recommendation follows ACR consensus guidelines: White Paper of the ACR Incidental Findings Committee II on Vascular Findings. J Am Coll Radiol 2013; 10:789-794. 2. Severe atherosclerotic disease in the abdominal aorta with irregular mural thrombus and ulcerative plaque. 3.  Mild narrowing involving the origin of the celiac trunk and SMA. 4. Severe stenosis in the right superficial femoral artery. Nonvascular 1. New poorly defined low-density lesion or mass involving the left hepatic lobe. This is concerning for a neoplastic process and new since 09/02/2021. Differential diagnosis also includes a hepatic abscess based on the inflammatory changes in the left abdomen and pericolonic region. Recommend liver MRI, with and without contrast, for further characterization of the new large liver lesion. 2. Focal inflammatory changes in the left paracolic gutter adjacent to the left colon. Morphology raises concern for epiploic appendagitis. Diverticulitis cannot be excluded. Recommend follow-up CT imaging of the abdomen and pelvis to follow this focal inflammatory change. These results will be called to the ordering clinician or representative by the Radiologist Assistant, and communication documented in the PACS or [employer]. Electronically Signed   By: Richarda Overlie M.D.   On: 06/06/2022 15:44   MR BRAIN WO CONTRAST  Result Date: 05/19/2022 CLINICAL DATA:  Altered mental status EXAM: MRI HEAD WITHOUT CONTRAST TECHNIQUE: Multiplanar, multiecho pulse sequences of the brain and surrounding structures were obtained without  intravenous contrast. COMPARISON:  05/17/2021 FINDINGS: Brain: No acute infarction, hemorrhage, hydrocephalus, extra-axial collection or mass lesion. No chronic microhemorrhage. There is multifocal hyperintense T2-weighted signal within the periventricular and deep white matter. Mild volume loss. Normal midline structures. Old small vessel infarct of the right internal capsule Vascular: Normal flow voids. Skull and upper cervical spine: Small left frontal scalp hematoma. Normal bone marrow signal. Sinuses/Orbits: Mastoids and paranasal sinuses are clear. Bilateral ocular lens replacements. Other: None IMPRESSION: 1. No acute intracranial abnormality. 2. Findings of chronic microvascular ischemia and volume loss. 3. Small left frontal scalp hematoma. Electronically Signed   By: Deatra Robinson M.D.   On: 05/19/2022 23:21   CT T-SPINE NO CHARGE  Result Date: 05/19/2022 CLINICAL DATA:  Thoracic compression fractures EXAM: CT THORACIC SPINE WITHOUT CONTRAST TECHNIQUE: Multidetector CT images of the thoracic were obtained using the standard protocol without intravenous contrast. RADIATION DOSE REDUCTION: This exam was performed according to the departmental dose-optimization program which includes automated exposure control, adjustment of the mA and/or kV according to patient size and/or use of iterative reconstruction technique. COMPARISON:  09/02/2021 FINDINGS: Alignment: Normal. Vertebrae: Compression fractures at T9, T10 and T12, status post augmentation. Compression deformities of T6 and T7 unchanged since 09/02/2021. There is new height loss at the T2-4 levels compared to 09/02/2021. There is less than 25% height loss at these levels. No retropulsion. The appearance is most suggestive of a subacute timeline. Paraspinal and other soft tissues: Please refer to dedicated report for CT of the chest performed the same day for discussion of the lungs and aorta. Disc levels: No bony spinal canal stenosis. IMPRESSION:  1. New, likely subacute compression fractures at the T2-4 levels with less than 25% height loss. 2. Compression fractures of T6 and T7 unchanged since 09/02/2021. 3. Compression fractures at T9, T10 and T12, status post augmentation. Electronically Signed   By: Deatra Robinson M.D.   On: 05/19/2022 22:21   CT CHEST WO CONTRAST  Result Date: 05/19/2022 CLINICAL DATA:  Blunt chest trauma EXAM: CT CHEST WITHOUT CONTRAST TECHNIQUE: Multidetector CT imaging of the chest was performed following the standard protocol without IV contrast. RADIATION DOSE REDUCTION: This exam was performed according to the departmental dose-optimization program which includes automated exposure control, adjustment of the mA and/or kV according to patient size and/or use of iterative reconstruction technique. COMPARISON:  05/17/2021 FINDINGS: Cardiovascular:  Extensive multi-vessel coronary artery calcification. Global cardiac size is within normal limits. No pericardial effusion. Central pulmonary arteries are stably enlarged in keeping with changes of pulmonary arterial hypertension. Extensive atherosclerotic calcification of the thoracic aorta. Fusiform descending thoracic aortic aneurysm is again seen measuring 4.1 cm in greatest dimension, likely stable since prior examination. The proximal abdominal aorta is nonaneurysmal. Ascending aorta is of normal caliber. Mediastinum/Nodes: No enlarged mediastinal or axillary lymph nodes. Thyroid gland, trachea, and esophagus demonstrate no significant findings. Lungs/Pleura: Mild emphysema. Stable chronic collapse and consolidation within the perifissural right upper lobe and middle lobe with associated volume loss. Progressive discoid atelectasis or scarring within the lingula. No pneumothorax or pleural effusion. No central obstructing mass. Upper Abdomen: There is interval development of a 4.1 x 5.3 cm mass within the left hepatic lobe at axial image # 109/2, new from prior examination but not  well characterized on this noncontrast exam. No acute abnormality. Musculoskeletal: The osseous structures are diffusely osteopenic. T9, T10, and now T12 vertebroplasty has been performed. Interval age-indeterminate compression deformities of T2-T4 with mild loss of height. Stable remote compression deformities of T6 and T7. Interval mild compression deformity of T11. Resultant accentuated thoracic kyphosis. Mild paravertebralq infiltration at T11 and L2 suggests that these may be acute to subacute in nature. No lytic or blastic bone lesion. IMPRESSION: 1. No acute intrathoracic injury. 2. Extensive multi-vessel coronary artery calcification. 3. Morphologic changes in keeping with pulmonary arterial hypertension. 4. Mild emphysema. 5. Interval development of a 5.3 cm mass within the left hepatic lobe, not well characterized on this noncontrast exam. Dedicated nonemergent contrast enhanced CT or MRI examination is recommended for further evaluation. 6. Interval T9, T10, and T12 vertebroplasty. Interval age-indeterminate compression deformities of T2-T4 and T11 with mild loss of height. Mild paravertebral infiltration at T11 and L2 suggests that these may be acute to subacute in nature. Correlation with point tenderness is recommended. If indicated, MRI examination may be more helpful to better age these fractures. 7. 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 Recommend semi-annual imaging followup by CTA or MRA and referral to cardiothoracic surgery if not already obtained. This recommendation follows 2010 ACCF/AHA/AATS/ACR/ASA/SCA/SCAI/SIR/STS/SVM Guidelines for the Diagnosis and Management of Patients With Thoracic Aortic Disease. Circulation. 2010; 121: P542-M64. Aortic aneurysm NOS (ICD10-I71.9) 8. Aortic atherosclerosis. Aortic Atherosclerosis (ICD10-I70.0) and Emphysema (ICD10-J43.9). Electronically Signed   By: Helyn Numbers M.D.   On: 05/19/2022 21:32   CT Head Wo Contrast  Result Date:  05/19/2022 CLINICAL DATA:  Head and neck trauma EXAM: CT HEAD WITHOUT CONTRAST CT CERVICAL SPINE WITHOUT CONTRAST TECHNIQUE: Multidetector CT imaging of the head and cervical spine was performed following the standard protocol without intravenous contrast. Multiplanar CT image reconstructions of the cervical spine were also generated. RADIATION DOSE REDUCTION: This exam was performed according to the departmental dose-optimization program which includes automated exposure control, adjustment of the mA and/or kV according to patient size and/or use of iterative reconstruction technique. COMPARISON:  CT head and cervical spine most recently 05/13/2022. Thoracic spine CT 09/02/2021 FINDINGS: CT HEAD FINDINGS Brain: There is no acute intracranial hemorrhage, extra-axial fluid collection, or acute infarct. Parenchymal volume is within expected limits for age. The ventricles are normal in size. Gray-white differentiation is preserved. Background chronic small-vessel ischemic change is stable. There is no mass lesion.  There is no mass effect or midline shift. Vascular: There is calcification of the bilateral carotid siphons. Skull: Normal. Negative for fracture or focal lesion. Sinuses/Orbits: The imaged paranasal sinuses are  clear. Bilateral lens implants are in place. The globes and orbits are otherwise unremarkable. Other: None. CT CERVICAL SPINE FINDINGS Alignment: There is unchanged trace anterolisthesis of C3 on C4 and C4 on C5. There is no jumped or perched facet or other evidence of traumatic malalignment. Skull base and vertebrae: Skull base alignment is maintained. Cervical vertebral body heights are preserved. There is compression deformity of the T2, T3, and T4 vertebral bodies. The T2 compression deformity was partially seen on the CT from 05/13/2022 and was present but appears progressed. The fracture is new since 03/01/2022. The compression deformity of the T3 vertebral body is unchanged since 03/01/2022.  The T4 compression fracture with up to approximately 30% loss of vertebral body height is new since 03/01/2022. There is no bony retropulsion at any level. There is no suspicious osseous lesion. Soft tissues and spinal canal: No prevertebral fluid or swelling. No visible canal hematoma. Disc levels: There is disc space narrowing degenerative endplate change most advanced at C5-C6 and C6-C7, and multilevel facet arthropathy, left worse than right. Upper chest: The imaged lung apices are clear. Other: None. IMPRESSION: 1. No acute intracranial pathology. 2. Compression fractures of the T2 through T4 vertebral bodies without bony retropulsion and up to approximately 30% loss of vertebral body height at T4. The T2 fracture was partially imaged on 05/13/2022 but appears slightly progressed, and is new since 03/01/2022. The T4 fracture is new since 03/01/2022 and may be acute. The T3 fracture is unchanged since 03/01/2022. 3. No acute fracture or traumatic malalignment of the cervical spine. Electronically Signed   By: Lesia Hausen M.D.   On: 05/19/2022 13:51   CT Cervical Spine Wo Contrast  Result Date: 05/19/2022 CLINICAL DATA:  Head and neck trauma EXAM: CT HEAD WITHOUT CONTRAST CT CERVICAL SPINE WITHOUT CONTRAST TECHNIQUE: Multidetector CT imaging of the head and cervical spine was performed following the standard protocol without intravenous contrast. Multiplanar CT image reconstructions of the cervical spine were also generated. RADIATION DOSE REDUCTION: This exam was performed according to the departmental dose-optimization program which includes automated exposure control, adjustment of the mA and/or kV according to patient size and/or use of iterative reconstruction technique. COMPARISON:  CT head and cervical spine most recently 05/13/2022. Thoracic spine CT 09/02/2021 FINDINGS: CT HEAD FINDINGS Brain: There is no acute intracranial hemorrhage, extra-axial fluid collection, or acute infarct. Parenchymal  volume is within expected limits for age. The ventricles are normal in size. Gray-white differentiation is preserved. Background chronic small-vessel ischemic change is stable. There is no mass lesion.  There is no mass effect or midline shift. Vascular: There is calcification of the bilateral carotid siphons. Skull: Normal. Negative for fracture or focal lesion. Sinuses/Orbits: The imaged paranasal sinuses are clear. Bilateral lens implants are in place. The globes and orbits are otherwise unremarkable. Other: None. CT CERVICAL SPINE FINDINGS Alignment: There is unchanged trace anterolisthesis of C3 on C4 and C4 on C5. There is no jumped or perched facet or other evidence of traumatic malalignment. Skull base and vertebrae: Skull base alignment is maintained. Cervical vertebral body heights are preserved. There is compression deformity of the T2, T3, and T4 vertebral bodies. The T2 compression deformity was partially seen on the CT from 05/13/2022 and was present but appears progressed. The fracture is new since 03/01/2022. The compression deformity of the T3 vertebral body is unchanged since 03/01/2022. The T4 compression fracture with up to approximately 30% loss of vertebral body height is new since 03/01/2022. There is no bony  retropulsion at any level. There is no suspicious osseous lesion. Soft tissues and spinal canal: No prevertebral fluid or swelling. No visible canal hematoma. Disc levels: There is disc space narrowing degenerative endplate change most advanced at C5-C6 and C6-C7, and multilevel facet arthropathy, left worse than right. Upper chest: The imaged lung apices are clear. Other: None. IMPRESSION: 1. No acute intracranial pathology. 2. Compression fractures of the T2 through T4 vertebral bodies without bony retropulsion and up to approximately 30% loss of vertebral body height at T4. The T2 fracture was partially imaged on 05/13/2022 but appears slightly progressed, and is new since 03/01/2022.  The T4 fracture is new since 03/01/2022 and may be acute. The T3 fracture is unchanged since 03/01/2022. 3. No acute fracture or traumatic malalignment of the cervical spine. Electronically Signed   By: Lesia Hausen M.D.   On: 05/19/2022 13:51   DG Chest 2 View  Result Date: 05/19/2022 CLINICAL DATA:  Cough, shortness of breath EXAM: CHEST - 2 VIEW COMPARISON:  Previous chest radiograph done on 05/13/2022 and CT done on 06/23/2021 FINDINGS: Transverse diameter of heart is increased. There are no signs of alveolar pulmonary edema. There are small linear patchy densities in both lower lung fields. There is no focal pulmonary consolidation. There is no significant pleural effusion or pneumothorax. Patient's chin is partly obscuring the apices limiting evaluation for small pneumothorax. Osteopenia is seen in bony structures. There is compression fracture in multiple thoracic vertebral bodies and previous kyphoplasty in multiple thoracic vertebral bodies. IMPRESSION: Cardiomegaly. Small linear densities in the lower lung fields may suggest scarring or atelectasis/pneumonia. Overall, no significant interval changes are noted since 05/13/2022. Electronically Signed   By: Ernie Avena M.D.   On: 05/19/2022 13:28    Assessment/Plan  AAA (abdominal aortic aneurysm) without rupture (HCC) Recent CT scan showed this to be stable at 3.4 cm in maximal diameter.  Recheck in 1 year with duplex.  Liver mass Unclear etiology.  I am going to refer to Dr. Servando Snare for further evaluation.  HLD (hyperlipidemia) lipid control important in reducing the progression of atherosclerotic disease. Continue statin therapy   Type 2 diabetes mellitus with hyperlipidemia (HCC) blood glucose control important in reducing the progression of atherosclerotic disease. Also, involved in wound healing. On appropriate medications.   Essential hypertension blood pressure control important in reducing the progression of  atherosclerotic disease. On appropriate oral medications.   Carotid stenosis Carotid duplex today reveals stable 1 to 39% ICA stenosis bilaterally without progression from previous studies.  No role for intervention.  Continue current medical regimen.  Recheck in 1 year.  Atherosclerosis of native arteries of the extremities with ulceration (HCC) Patient is nonhealing ulceration on her left foot that is a critical and limb threatening situation.  We can get her in the near future to check her lower extremity perfusion and make sure is adequate for wound healing.  I discussed the serious nature of the situation with the patient and her family and they agree with evaluating her perfusion.    Festus Barren, MD  06/13/2022 4:13 PM    This note was created with Dragon medical transcription system.  Any errors from dictation are purely unintentional

## 2022-06-13 NOTE — Assessment & Plan Note (Signed)
blood pressure control important in reducing the progression of atherosclerotic disease. On appropriate oral medications.  

## 2022-06-13 NOTE — Assessment & Plan Note (Signed)
Carotid duplex today reveals stable 1 to 39% ICA stenosis bilaterally without progression from previous studies.  No role for intervention.  Continue current medical regimen.  Recheck in 1 year.

## 2022-06-13 NOTE — Assessment & Plan Note (Signed)
lipid control important in reducing the progression of atherosclerotic disease. Continue statin therapy  

## 2022-06-19 ENCOUNTER — Encounter (INDEPENDENT_AMBULATORY_CARE_PROVIDER_SITE_OTHER): Payer: Self-pay | Admitting: Vascular Surgery

## 2022-06-22 ENCOUNTER — Ambulatory Visit (INDEPENDENT_AMBULATORY_CARE_PROVIDER_SITE_OTHER): Payer: Medicare Other

## 2022-06-22 DIAGNOSIS — I7025 Atherosclerosis of native arteries of other extremities with ulceration: Secondary | ICD-10-CM

## 2022-06-28 ENCOUNTER — Encounter (INDEPENDENT_AMBULATORY_CARE_PROVIDER_SITE_OTHER): Payer: Self-pay | Admitting: Vascular Surgery

## 2022-06-28 NOTE — Telephone Encounter (Signed)
Please make the patient a follow up appointment.  Unfortunately one was not made to discuss her [place] results.  We need to see her to discuss, she should be seen in the next week.  Also, clarify if Dr. Lucky Cowboy was going to make the referral because he doesn't have anything mentioned in his notes

## 2022-07-04 ENCOUNTER — Encounter (INDEPENDENT_AMBULATORY_CARE_PROVIDER_SITE_OTHER): Payer: Self-pay | Admitting: Vascular Surgery

## 2022-07-04 ENCOUNTER — Ambulatory Visit (INDEPENDENT_AMBULATORY_CARE_PROVIDER_SITE_OTHER): Payer: Medicare Other | Admitting: Vascular Surgery

## 2022-07-04 VITALS — BP 144/79 | HR 93 | Ht 64.0 in | Wt 136.0 lb

## 2022-07-04 DIAGNOSIS — I10 Essential (primary) hypertension: Secondary | ICD-10-CM

## 2022-07-04 DIAGNOSIS — I7143 Infrarenal abdominal aortic aneurysm, without rupture: Secondary | ICD-10-CM | POA: Diagnosis not present

## 2022-07-04 DIAGNOSIS — I7025 Atherosclerosis of native arteries of other extremities with ulceration: Secondary | ICD-10-CM | POA: Diagnosis not present

## 2022-07-04 DIAGNOSIS — E1169 Type 2 diabetes mellitus with other specified complication: Secondary | ICD-10-CM

## 2022-07-04 DIAGNOSIS — I6523 Occlusion and stenosis of bilateral carotid arteries: Secondary | ICD-10-CM

## 2022-07-04 DIAGNOSIS — E785 Hyperlipidemia, unspecified: Secondary | ICD-10-CM

## 2022-07-04 DIAGNOSIS — R160 Hepatomegaly, not elsewhere classified: Secondary | ICD-10-CM

## 2022-07-04 NOTE — Progress Notes (Signed)
MRN : [id number]  Marissa Fletcher is a 82 y.o. ([birth year]) female who presents with chief complaint of  Chief Complaint  Patient presents with   Follow-up    Ultrasound  .  History of Present Illness: Patient returns today in follow up of her noninvasive studies to assess for PAD.  She reports having a sore toe on the left foot and follows with podiatry.  There is ulceration there.  She is still waiting to hear about her referral to GI for her liver mass.  She denies any new or changing symptoms since her last visit other than worsening pain in her left great toe.  ABIs were 0.67 on the right and 0.68 on the left with biphasic waveforms on the right and monophasic waveforms on the left and reduced digital pressures.  Current Outpatient Medications  Medication Sig Dispense Refill   acetaminophen (TYLENOL) 500 MG tablet Take 2 tablets (1,000 mg total) by mouth 3 (three) times daily as needed for mild pain, moderate pain or headache. 30 tablet 0   ALPRAZolam (XANAX) 0.25 MG tablet Take 1 tablet (0.25 mg total) by mouth daily as needed for anxiety. 10 tablet 0   aspirin EC 81 MG EC tablet Take 1 tablet (81 mg total) by mouth daily. Swallow whole. 30 tablet 11   atorvastatin (LIPITOR) 20 MG tablet Take 20 mg by mouth every Monday, Wednesday, and Friday.     carvedilol (COREG) 6.25 MG tablet Take 1 tablet (6.25 mg total) by mouth 2 (two) times daily with a meal. 60 tablet 2   clopidogrel (PLAVIX) 75 MG tablet Take 1 tablet (75 mg total) by mouth daily. 30 tablet 12   doxepin (SINEQUAN) 25 MG capsule Take 50-75 mg by mouth at bedtime.     ferrous sulfate 325 (65 FE) MG tablet Take 1 tablet (325 mg total) by mouth daily. 30 tablet 3   furosemide (LASIX) 20 MG tablet TAKE ONE TABLET BY MOUTH DAILY 30 tablet 4   gabapentin (NEURONTIN) 300 MG capsule Take 300 mg by mouth at bedtime. 200-300 mg when needed     magnesium oxide (MAG-OX) 400 (240 Mg) MG tablet Take 1 tablet (400 mg total) by mouth  daily. 30 tablet 0   metFORMIN (GLUCOPHAGE-XR) 500 MG 24 hr tablet Take 500 mg by mouth 2 (two) times daily.     nystatin (MYCOSTATIN/NYSTOP) powder Apply 1 Application topically 3 (three) times daily. 15 g 0   PARoxetine (PAXIL) 30 MG tablet Take 30 mg by mouth every morning.     No current facility-administered medications for this visit.    Past Medical History:  Diagnosis Date   Anxiety    Aortic atherosclerosis ([place])    Arthritis    Cardiomyopathy ([place])    a. 04/2021 Echo: EF 35-40%, glob HK. GrII DD.  Nl RV size/fxn. Mod dil LA. Mild MR.   Carotid arterial disease ([place])    a. 04/2021 CTA Head/neck: RICA 60, LICA 55.   COPD (chronic obstructive pulmonary disease) (HCC)    Depression    Diabetes mellitus without complication ([place])    History of kidney stones    Hypertension    Lung cancer ([place])    Stroke (cerebrum) ([place])    a. 04/2021 MRI brain: Acute infarct post limb of R internal capsule.   Thoracic aortic aneurysm (HCC)    Tobacco abuse     Past Surgical History:  Procedure Laterality Date   ABDOMINAL HYSTERECTOMY     CHOLECYSTECTOMY  EYE SURGERY Bilateral    IR KYPHO THORACIC WITH BONE BIOPSY  11/24/2021   IR RADIOLOGIST EVAL & MGMT  12/13/2021   KYPHOPLASTY N/A 07/30/2020   Procedure: T10 Kyphoplasty;  Surgeon: Hessie Knows, MD;  Location: [facility] ORS;  Service: Orthopedics;  Laterality: N/A;  T10   KYPHOPLASTY N/A 08/19/2020   Procedure: T9 YPHOPLASTY;  Surgeon: Hessie Knows, MD;  Location: [facility] ORS;  Service: Orthopedics;  Laterality: N/A;     Social History   Tobacco Use   Smoking status: Every Day    Packs/day: 0.50    Years: 68.00    Total pack years: 34.00    Types: Cigarettes   Smokeless tobacco: Never  Vaping Use   Vaping Use: Never used  Substance Use Topics   Alcohol use: Never   Drug use: Never      Family History  Problem Relation Age of Onset   Dementia Mother    Congestive Heart Failure Mother    Bladder Cancer Father    Heart  disease Father    Breast cancer Neg Hx      Allergies  Allergen Reactions   Farxiga [Dapagliflozin] Other (See Comments)    Recurring yeast infection   Sulfa Antibiotics Swelling    REVIEW OF SYSTEMS (Negative unless checked)   Constitutional: [] Weight loss  [] Fever  [] Chills Cardiac: [] Chest pain   [] Chest pressure   [] Palpitations   [] Shortness of breath when laying flat   [] Shortness of breath at rest   [x] Shortness of breath with exertion. Vascular:  [] Pain in legs with walking   [] Pain in legs at rest   [] Pain in legs when laying flat   [] Claudication   [] Pain in feet when walking  [] Pain in feet at rest  [] Pain in feet when laying flat   [] History of DVT   [] Phlebitis   [] Swelling in legs   [] Varicose veins   [] Non-healing ulcers Pulmonary:   [] Uses home oxygen   [] Productive cough   [] Hemoptysis   [] Wheeze  [x] COPD   [] Asthma Neurologic:  [] Dizziness  [] Blackouts   [] Seizures   [x] History of stroke   [] History of TIA  [] Aphasia   [] Temporary blindness   [] Dysphagia   [] Weakness or numbness in arms   [] Weakness or numbness in legs Musculoskeletal:  [x] Arthritis   [] Joint swelling   [] Joint pain   [x] Low back pain Hematologic:  [] Easy bruising  [] Easy bleeding   [] Hypercoagulable state   [] Anemic  [] Hepatitis Gastrointestinal:  [] Blood in stool   [] Vomiting blood  [x] Gastroesophageal reflux/heartburn   [] Abdominal pain Genitourinary:  [] Chronic kidney disease   [] Difficult urination  [] Frequent urination  [] Burning with urination   [] Hematuria Skin:  [] Rashes   [] Ulcers   [] Wounds Psychological:  [x] History of anxiety   []  History of major depression.  Physical Examination  BP (!) 144/79   Pulse 93   Ht 5\' 4"  (1.626 m)   Wt 136 lb (61.7 kg)   BMI 23.34 kg/m  Gen:  WD/WN, NAD Head: [place]/AT, No temporalis wasting. Ear/Nose/Throat: Hearing grossly intact, nares w/o erythema or drainage Eyes: Conjunctiva clear. Sclera non-icteric Neck: Supple.  Trachea midline Pulmonary:  Good  air movement, no use of accessory muscles.  Cardiac: RRR, no JVD Vascular:  Vessel Right Left  Radial Palpable Palpable                          PT 1+ Palpable Not Palpable  DP 1+ Palpable 1+ Palpable  Musculoskeletal: M/S 5/5 throughout.  No deformity or atrophy. In a wheelchair.  Left great toe with some cellulitis and ulceration under the old nail bed.  Neurologic: Sensation grossly intact in extremities.  Symmetrical.  Speech is fluent.  Psychiatric: Judgment intact, Mood & affect appropriate for pt's clinical situation. Dermatologic: Left great toe as above      Labs Recent Results (from the past 2160 hour(s))  CBC with Differential     Status: Abnormal   Collection Time: 05/19/22 12:59 PM  Result Value Ref Range   WBC 7.8 4.0 - 10.5 K/uL   RBC 3.91 3.87 - 5.11 MIL/uL   Hemoglobin 8.8 (L) 12.0 - 15.0 g/dL   HCT 30.9 (L) 36.0 - 46.0 %   MCV 79.0 (L) 80.0 - 100.0 fL   MCH 22.5 (L) 26.0 - 34.0 pg   MCHC 28.5 (L) 30.0 - 36.0 g/dL   RDW 18.1 (H) 11.5 - 15.5 %   Platelets 367 150 - 400 K/uL   nRBC 0.0 0.0 - 0.2 %   Neutrophils Relative % 73 %   Neutro Abs 5.7 1.7 - 7.7 K/uL   Lymphocytes Relative 14 %   Lymphs Abs 1.1 0.7 - 4.0 K/uL   Monocytes Relative 10 %   Monocytes Absolute 0.7 0.1 - 1.0 K/uL   Eosinophils Relative 3 %   Eosinophils Absolute 0.2 0.0 - 0.5 K/uL   Basophils Relative 0 %   Basophils Absolute 0.0 0.0 - 0.1 K/uL   Immature Granulocytes 0 %   Abs Immature Granulocytes 0.02 0.00 - 0.07 K/uL    Comment: Performed at [facility], [address] [place], [place] [address]  Basic metabolic panel     Status: Abnormal   Collection Time: 05/19/22 12:59 PM  Result Value Ref Range   Sodium 140 135 - 145 mmol/L   Potassium 3.0 (L) 3.5 - 5.1 mmol/L   Chloride 106 98 - 111 mmol/L   CO2 25 22 - 32 mmol/L   Glucose, Bld 127 (H) 70 - 99 mg/dL    Comment: Glucose reference range applies only to samples taken after fasting for at least 8 hours.    BUN 16 8 - 23 mg/dL   Creatinine, Ser 0.72 0.44 - 1.00 mg/dL   Calcium 8.3 (L) 8.9 - 10.3 mg/dL   GFR, Estimated >60 >60 mL/min    Comment: (NOTE) Calculated using the CKD-EPI Creatinine Equation (2021)    Anion gap 9 5 - 15    Comment: Performed at [facility], [address] [place], [place] [address]  Troponin I (High Sensitivity)     Status: Abnormal   Collection Time: 05/19/22 12:59 PM  Result Value Ref Range   Troponin I (High Sensitivity) 25 (H) <18 ng/L    Comment: (NOTE) Elevated high sensitivity troponin I (hsTnI) values and significant  changes across serial measurements may suggest ACS but many other  chronic and acute conditions are known to elevate hsTnI results.  Refer to the "Links" section for chest pain algorithms and additional  guidance. Performed at [facility], [address] [place], [place] [address]   Urinalysis, Routine w reflex microscopic Urine, Clean Catch     Status: Abnormal   Collection Time: 05/19/22 12:59 PM  Result Value Ref Range   Color, Urine AMBER (A) YELLOW    Comment: BIOCHEMICALS MAY BE AFFECTED BY COLOR   APPearance CLOUDY (A) CLEAR   Specific Gravity, Urine 1.028 1.005 - 1.030   pH 5.0  5.0 - 8.0   Glucose, UA NEGATIVE NEGATIVE mg/dL   Hgb urine dipstick NEGATIVE NEGATIVE   Bilirubin Urine NEGATIVE NEGATIVE   Ketones, ur 5 (A) NEGATIVE mg/dL   Protein, ur 100 (A) NEGATIVE mg/dL   Nitrite NEGATIVE NEGATIVE   Leukocytes,Ua NEGATIVE NEGATIVE   RBC / HPF 0-5 0 - 5 RBC/hpf   WBC, UA 6-10 0 - 5 WBC/hpf   Bacteria, UA NONE SEEN NONE SEEN   Squamous Epithelial / HPF 11-20 0 - 5 /HPF   Mucus PRESENT    Hyaline Casts, UA PRESENT     Comment: Performed at [facility], [address], [place], [place] [address]  Brain natriuretic peptide     Status: Abnormal   Collection Time: 05/19/22 12:59 PM  Result Value Ref Range   B Natriuretic Peptide 231.6 (H) 0.0 - 100.0 pg/mL    Comment: Performed at  [facility], [address] [place], [place] [address]  Resp panel by RT-PCR (RSV, Flu A&B, Covid) Anterior Nasal Swab     Status: None   Collection Time: 05/19/22 12:59 PM   Specimen: Anterior Nasal Swab  Result Value Ref Range   SARS Coronavirus 2 by RT PCR NEGATIVE NEGATIVE    Comment: (NOTE) SARS-CoV-2 target nucleic acids are NOT DETECTED.  The SARS-CoV-2 RNA is generally detectable in upper respiratory specimens during the acute phase of infection. The lowest concentration of SARS-CoV-2 viral copies this assay can detect is 138 copies/mL. A negative result does not preclude SARS-Cov-2 infection and should not be used as the sole basis for treatment or other patient management decisions. A negative result may occur with  improper specimen collection/handling, submission of specimen other than nasopharyngeal swab, presence of viral mutation(s) within the areas targeted by this assay, and inadequate number of viral copies(<138 copies/mL). A negative result must be combined with clinical observations, patient history, and epidemiological information. The expected result is Negative.  Fact Sheet for Patients:  EntrepreneurPulse.com.au  Fact Sheet for Healthcare Providers:  IncredibleEmployment.be  This test is no t yet approved or cleared by the [place] FDA and  has been authorized for detection and/or diagnosis of SARS-CoV-2 by FDA under an Emergency Use Authorization (EUA). This EUA will remain  in effect (meaning this test can be used) for the duration of the COVID-19 declaration under Section 564(b)(1) of the Act, 21 U.S.C.section 360bbb-3(b)(1), unless the authorization is terminated  or revoked sooner.       Influenza A by PCR NEGATIVE NEGATIVE   Influenza B by PCR NEGATIVE NEGATIVE    Comment: (NOTE) The Xpert Xpress SARS-CoV-2/FLU/RSV plus assay is intended as an aid in the diagnosis of influenza from  Nasopharyngeal swab specimens and should not be used as a sole basis for treatment. Nasal washings and aspirates are unacceptable for Xpert Xpress SARS-CoV-2/FLU/RSV testing.  Fact Sheet for Patients: EntrepreneurPulse.com.au  Fact Sheet for Healthcare Providers: IncredibleEmployment.be  This test is not yet approved or cleared by the [place] FDA and has been authorized for detection and/or diagnosis of SARS-CoV-2 by FDA under an Emergency Use Authorization (EUA). This EUA will remain in effect (meaning this test can be used) for the duration of the COVID-19 declaration under Section 564(b)(1) of the Act, 21 U.S.C. section 360bbb-3(b)(1), unless the authorization is terminated or revoked.     Resp Syncytial Virus by PCR NEGATIVE NEGATIVE    Comment: (NOTE) Fact Sheet for Patients: EntrepreneurPulse.com.au  Fact Sheet for Healthcare Providers: IncredibleEmployment.be  This test is  not yet approved or cleared by the [place] and has been authorized for detection and/or diagnosis of SARS-CoV-2 by FDA under an Emergency Use Authorization (EUA). This EUA will remain in effect (meaning this test can be used) for the duration of the COVID-19 declaration under Section 564(b)(1) of the Act, 21 U.S.C. section 360bbb-3(b)(1), unless the authorization is terminated or revoked.  Performed at [facility], [address] [place], [place] [address]   CK     Status: None   Collection Time: 05/19/22 12:59 PM  Result Value Ref Range   Total CK 145 38 - 234 U/L    Comment: Performed at [facility], [address] [place], [place] [address]  Troponin I (High Sensitivity)     Status: Abnormal   Collection Time: 05/19/22  2:55 PM  Result Value Ref Range   Troponin I (High Sensitivity) 51 (H) <18 ng/L    Comment: READ BACK AND VERIFIED WITH ASHLEY ELLWINGER 05/19/22 @ 1744 BY  SB (NOTE) Elevated high sensitivity troponin I (hsTnI) values and significant  changes across serial measurements may suggest ACS but many other  chronic and acute conditions are known to elevate hsTnI results.  Refer to the "Links" section for chest pain algorithms and additional  guidance. Performed at [facility], [address] [place], [place] [address]   Blood gas, venous     Status: Abnormal   Collection Time: 05/19/22  9:29 PM  Result Value Ref Range   pH, Ven 7.4 7.25 - 7.43   pCO2, Ven 48 44 - 60 mmHg   Bicarbonate 29.7 (H) 20.0 - 28.0 mmol/L   Acid-Base Excess 4.0 (H) 0.0 - 2.0 mmol/L   O2 Saturation 21.9 %   Patient temperature 37.0    Collection site VEIN     Comment: Performed at [facility], [address] [place], [place] [address]  B Nat Peptide     Status: None   Collection Time: 06/09/22  3:19 PM  Result Value Ref Range   B Natriuretic Peptide 92.9 0.0 - 100.0 pg/mL    Comment: Performed at [facility], [address] [place], [place] [address]  Basic metabolic panel     Status: Abnormal   Collection Time: 06/09/22  3:19 PM  Result Value Ref Range   Sodium 137 135 - 145 mmol/L   Potassium 4.2 3.5 - 5.1 mmol/L   Chloride 103 98 - 111 mmol/L   CO2 27 22 - 32 mmol/L   Glucose, Bld 109 (H) 70 - 99 mg/dL    Comment: Glucose reference range applies only to samples taken after fasting for at least 8 hours.   BUN 12 8 - 23 mg/dL   Creatinine, Ser 0.59 0.44 - 1.00 mg/dL   Calcium 8.5 (L) 8.9 - 10.3 mg/dL   GFR, Estimated >60 >60 mL/min    Comment: (NOTE) Calculated using the CKD-EPI Creatinine Equation (2021)    Anion gap 7 5 - 15    Comment: Performed at [facility], [address], [place],  [address]    Radiology VAS [place] ABI WITH/WO TBI  Result Date: 06/30/2022  [place] STUDY Patient Name:  Marissa Fletcher  Date of Exam:   06/22/2022 Medical Rec #: [id number]        Accession #:     [id number] Date of Birth: 10-16-1940        Patient Gender: F Patient Age:   83 years Exam Location:  [facility] Procedure:      VAS [place] ABI WITH/WO TBI Referring Phys: --------------------------------------------------------------------------------  Indications: Peripheral artery disease. lt 2nd toe infection  Performing Technologist: Concha Norway RVT  Examination Guidelines: A complete evaluation includes at minimum, Doppler waveform signals and systolic blood pressure reading at the level of bilateral brachial, anterior tibial, and posterior tibial arteries, when vessel segments are accessible. Bilateral testing is considered an integral part of a complete examination. Photoelectric Plethysmograph (PPG) waveforms and toe systolic pressure readings are included as required and additional duplex testing as needed. Limited examinations for reoccurring indications may be performed as noted.  ABI Findings: +---------+------------------+-----+--------+--------+ Right    Rt Pressure (mmHg)IndexWaveformComment  +---------+------------------+-----+--------+--------+ Brachial 159                                     +---------+------------------+-----+--------+--------+ ATA      107               0.67 biphasic         +---------+------------------+-----+--------+--------+ PTA      106               0.67 biphasic         +---------+------------------+-----+--------+--------+ Great Toe64                0.40 Abnormal         +---------+------------------+-----+--------+--------+ +---------+------------------+-----+----------+-------+ Left     Lt Pressure (mmHg)IndexWaveform  Comment +---------+------------------+-----+----------+-------+ ATA      103               0.65 monophasic        +---------+------------------+-----+----------+-------+ PTA      108               0.68 monophasic        +---------+------------------+-----+----------+-------+ Great Toe75                 0.47 Abnormal          +---------+------------------+-----+----------+-------+  Summary: Right: Resting right ankle-brachial index indicates moderate right lower extremity arterial disease. The right toe-brachial index is abnormal. Left: Resting left ankle-brachial index indicates moderate left lower extremity arterial disease. The left toe-brachial index is abnormal. *See table(s) above for measurements and observations.  Electronically signed by Leotis Pain MD on 06/30/2022 at 9:56:39 AM.    Final    VAS US CAROTID  Result Date: 06/19/2022 Carotid Arterial Duplex Study Patient Name:  Marissa Fletcher  Date of Exam:   06/13/2022 Medical Rec #: [id number]        Accession #:    [id number] Date of Birth: 1940/10/30        Patient Gender: F Patient Age:   53 years Exam Location:  [facility] Procedure:      VAS US CAROTID Referring Phys: Leotis Pain --------------------------------------------------------------------------------  Indications:  Carotid artery disease. Risk Factors: Hypertension, Diabetes, current smoker. Performing Technologist: Delorise Shiner RVT  Examination Guidelines: A complete evaluation includes B-mode imaging, spectral Doppler, color Doppler, and power Doppler as needed of all accessible portions of each vessel. Bilateral testing is considered an integral part of a complete examination. Limited examinations for reoccurring indications may be performed as noted.  Right Carotid Findings: +----------+--------+--------+--------+------------------+--------+           PSV cm/sEDV cm/sStenosisPlaque DescriptionComments +----------+--------+--------+--------+------------------+--------+ CCA Prox  101     16                                         +----------+--------+--------+--------+------------------+--------+  CCA Mid   102     20                                         +----------+--------+--------+--------+------------------+--------+ CCA Distal82      23                                          +----------+--------+--------+--------+------------------+--------+ ICA Prox  121     30      1-39%   diffuse                    +----------+--------+--------+--------+------------------+--------+ ICA Mid   92      24                                         +----------+--------+--------+--------+------------------+--------+ ICA Distal103     31                                         +----------+--------+--------+--------+------------------+--------+ ECA       85      0                                          +----------+--------+--------+--------+------------------+--------+ +----------+--------+-------+----------------+-------------------+           PSV cm/sEDV cmsDescribe        Arm Pressure (mmHG) +----------+--------+-------+----------------+-------------------+ [id number]            Multiphasic, WNL                    +----------+--------+-------+----------------+-------------------+ +---------+--------+--+--------+--+---------+ VertebralPSV cm/s72EDV cm/s21Antegrade +---------+--------+--+--------+--+---------+  Left Carotid Findings: +----------+--------+--------+--------+----------------------+--------+           PSV cm/sEDV cm/sStenosisPlaque Description    Comments +----------+--------+--------+--------+----------------------+--------+ CCA Prox  110     24                                             +----------+--------+--------+--------+----------------------+--------+ CCA Mid   105     23                                             +----------+--------+--------+--------+----------------------+--------+ CCA Distal64      15                                             +----------+--------+--------+--------+----------------------+--------+ ICA Prox  101     21      1-39%   calcific and irregular         +----------+--------+--------+--------+----------------------+--------+ ICA  Mid   116     32  tortuous +----------+--------+--------+--------+----------------------+--------+ ICA Distal119     35                                    tortuous +----------+--------+--------+--------+----------------------+--------+ ECA       139     0               calcific and irregular         +----------+--------+--------+--------+----------------------+--------+ +----------+--------+--------+----------------+-------------------+           PSV cm/sEDV cm/sDescribe        Arm Pressure (mmHG) +----------+--------+--------+----------------+-------------------+ [id number]             Multiphasic, WNL                    +----------+--------+--------+----------------+-------------------+ +---------+--------+--+--------+--+---------+ VertebralPSV cm/s90EDV cm/s22Antegrade +---------+--------+--+--------+--+---------+   Summary: Right Carotid: Velocities in the right ICA are consistent with a 1-39% stenosis. Left Carotid: Velocities in the left ICA are consistent with a 1-39% stenosis. Vertebrals:  Bilateral vertebral arteries demonstrate antegrade flow. Subclavians: Normal flow hemodynamics were seen in bilateral subclavian              arteries. *See table(s) above for measurements and observations.  Electronically signed by Leotis Pain MD on 06/19/2022 at 8:21:26 AM.    Final     Assessment/Plan AAA (abdominal aortic aneurysm) without rupture (HCC) Recent CT scan showed this to be stable at 3.4 cm in maximal diameter.  Recheck in 1 year with duplex.   Liver mass Unclear etiology.  I am going to refer to Dr. Allen Norris for further evaluation.   HLD (hyperlipidemia) lipid control important in reducing the progression of atherosclerotic disease. Continue statin therapy     Type 2 diabetes mellitus with hyperlipidemia (HCC) blood glucose control important in reducing the progression of atherosclerotic disease. Also, involved in wound  healing. On appropriate medications.     Essential hypertension blood pressure control important in reducing the progression of atherosclerotic disease. On appropriate oral medications.  Carotid stenosis Carotid duplex recently reveals stable 1 to 39% ICA stenosis bilaterally without progression from previous studies.  No role for intervention.  Continue current medical regimen.  Recheck in 1 year.  Atherosclerosis of native arteries of the extremities with ulceration (HCC) ABIs were 0.67 on the right and 0.68 on the left with biphasic waveforms on the right and monophasic waveforms on the left and reduced digital pressures.  This is an extremely difficult and limb threatening situation in a complex patient with multiple medical issues.  I have messaged her podiatrist and I think ultimately she will need an angiogram with possible revascularization of the left lower extremity for limb salvage.  She and her family want to try to get her liver lesion characterized and understand the prognosis of this prior to doing anything invasive in her vascular system and that is certainly reasonable.  I will send a message to our GI specialist to I am referring her to workup of the liver lesion as well.  Overall, this is a very difficult and complex situation with a high risk of limb loss.    Leotis Pain, MD  07/05/2022 3:28 PM    This note was created with Dragon medical transcription system.  Any errors from dictation are purely unintentional

## 2022-07-05 NOTE — Assessment & Plan Note (Addendum)
ABIs were 0.67 on the right and 0.68 on the left with biphasic waveforms on the right and monophasic waveforms on the left and reduced digital pressures.  This is an extremely difficult and limb threatening situation in a complex patient with multiple medical issues.  I have messaged her podiatrist and I think ultimately she will need an angiogram with possible revascularization of the left lower extremity for limb salvage.  She and her family want to try to get her liver lesion characterized and understand the prognosis of this prior to doing anything invasive in her vascular system and that is certainly reasonable.  I will send a message to our GI specialist to I am referring her to workup of the liver lesion as well.  Overall, this is a very difficult and complex situation with a high risk of limb loss.

## 2022-07-07 ENCOUNTER — Encounter (INDEPENDENT_AMBULATORY_CARE_PROVIDER_SITE_OTHER): Payer: Self-pay | Admitting: Vascular Surgery

## 2022-07-18 ENCOUNTER — Ambulatory Visit (INDEPENDENT_AMBULATORY_CARE_PROVIDER_SITE_OTHER): Payer: Medicare Other | Admitting: Vascular Surgery

## 2022-07-18 ENCOUNTER — Encounter (INDEPENDENT_AMBULATORY_CARE_PROVIDER_SITE_OTHER): Payer: Self-pay | Admitting: Vascular Surgery

## 2022-07-18 VITALS — BP 122/69 | HR 88 | Resp 18 | Ht 64.0 in | Wt 132.0 lb

## 2022-07-18 DIAGNOSIS — I6523 Occlusion and stenosis of bilateral carotid arteries: Secondary | ICD-10-CM

## 2022-07-18 DIAGNOSIS — I7025 Atherosclerosis of native arteries of other extremities with ulceration: Secondary | ICD-10-CM

## 2022-07-18 DIAGNOSIS — I7143 Infrarenal abdominal aortic aneurysm, without rupture: Secondary | ICD-10-CM

## 2022-07-18 DIAGNOSIS — I10 Essential (primary) hypertension: Secondary | ICD-10-CM | POA: Diagnosis not present

## 2022-07-18 DIAGNOSIS — E1169 Type 2 diabetes mellitus with other specified complication: Secondary | ICD-10-CM

## 2022-07-18 DIAGNOSIS — E785 Hyperlipidemia, unspecified: Secondary | ICD-10-CM

## 2022-07-18 NOTE — Assessment & Plan Note (Signed)
Recently checked and stable.

## 2022-07-18 NOTE — Patient Instructions (Signed)
Angiogram, Care After After the procedure, it is common to have: Bruising and tenderness at the catheter insertion area. A collection of blood (hematoma) at the insertion area. This may feel like a small lump under the skin at the insertion site. Follow these instructions at home: Insertion site care  Follow instructions from your health care provider about how to take care of your insertion site. Make sure you: Wash your hands with soap and water for at least 20 seconds before and after you change your bandage (dressing). If soap and water are not available, use hand sanitizer. Change your dressing as told by your health care provider. Do not take baths, swim, or use a hot tub until your health care provider approves. You may shower 24-48 hours after the procedure, or as told by your health care provider. To clean the insertion site: Gently wash the area with plain soap and water. Pat the area dry with a clean towel. Do not rub the site. This may cause bleeding. Do not apply powder or lotion to the site. Keep the site clean and dry. Check your insertion site every day for signs of infection. Check for: Redness, swelling, or pain. Fluid or blood. Warmth. Pus or a bad smell. Activity If you were given a sedative during the procedure, it can affect you for several hours. Do not drive or operate machinery until your health care provider says that it is safe. Rest as told by your health care provider. This is usually for 1-2 days. If the catheter was inserted through your leg, avoid walking up or down the stairs for a few days. If the catheter was inserted through your wrist, avoid repetitive hand and wrist movement for a few days. Return to your normal activities as told by your health care provider, usually in about a week. Ask your health care provider what activities are safe for you. General instructions If your insertion site starts to bleed, lie flat and put pressure on the site. If  the bleeding does not stop, get help right away. This is a medical emergency. Take over-the-counter and prescription medicines only as told by your health care provider. Drink enough fluid to keep your urine pale yellow. This helps flush the contrast dye from your body. Keep all follow-up visits for continued treatment and for your safety. Contact a health care provider if: You have a fever or chills. You have any signs of infection at the insertion site. You have slight bleeding from the insertion area. Hold pressure on the area. Get help right away if: You have a problem in the insertion area, such as: Severe pain, rapid swelling, or bleeding that does not stop when you apply firm pressure to the area. Fluid, pus or a bad smell coming from your insertion site. The insertion site or limb becomes pale, cool, tingly, or numb. Pain in the limb of the insertion site. You have chest pain. You have trouble breathing. You have any symptoms of a stroke. "BE FAST" is an easy way to remember the main warning signs of a stroke: B - Balance. Signs are dizziness, sudden trouble walking, or loss of balance. E - Eyes. Signs are trouble seeing or a sudden change in vision. F - Face. Signs are sudden weakness or loss of feeling of the face, or the face or eyelid drooping on one side. A - Arms. Signs are weakness or loss of feeling in an arm. This happens suddenly and usually on one side of the  body. S - Speech. Signs are sudden trouble speaking, slurred speech, or trouble understanding what people say. T - Time. Time to call emergency services. Write down what time symptoms started. You have other signs of a stroke, such as: A sudden, severe headache with no known cause. Nausea or vomiting. Seizure. You have severe pain in your hand or leg. These symptoms may be an emergency. Get help right away. Call 911. Do not wait to see if the symptoms will go away. Do not drive yourself to the hospital. This  information is not intended to replace advice given to you by your health care provider. Make sure you discuss any questions you have with your health care provider. Document Revised: 11/30/2021 Document Reviewed: 11/30/2021 [employer] Patient Education  [place].

## 2022-07-18 NOTE — H&P (View-Only) (Signed)
  MRN : [id number]  Marissa Fletcher is a 82 y.o. (07/06/1940) female who presents with chief complaint of  Chief Complaint  Patient presents with   Follow-up    office visit for blockage and procedure questions  .  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  She was recently seen and we have followed her for her carotid disease and her abdominal aortic aneurysm.  These were recently checked with noninvasive studies.  She has known significant malperfusion of the left lower extremity with a nonhealing ulceration.  She follows with podiatry who saw her recently and asked her to rediscuss revascularization with us.  Overall, her ulcer is doing okay and seems to be healing very slowly.  She has a liver lesion is scheduled to see a GI doctor in the coming months.  She is having a lot of pain in the left foot as well as swelling in both lower extremities.  Her activity level is quite poor at this point  Current Outpatient Medications  Medication Sig Dispense Refill   acetaminophen (TYLENOL) 500 MG tablet Take 2 tablets (1,000 mg total) by mouth 3 (three) times daily as needed for mild pain, moderate pain or headache. 30 tablet 0   ALPRAZolam (XANAX) 0.25 MG tablet Take 1 tablet (0.25 mg total) by mouth daily as needed for anxiety. 10 tablet 0   aspirin EC 81 MG EC tablet Take 1 tablet (81 mg total) by mouth daily. Swallow whole. 30 tablet 11   atorvastatin (LIPITOR) 20 MG tablet Take 20 mg by mouth every Monday, Wednesday, and Friday.     carvedilol (COREG) 6.25 MG tablet Take 1 tablet (6.25 mg total) by mouth 2 (two) times daily with a meal. 60 tablet 2   clopidogrel (PLAVIX) 75 MG tablet Take 1 tablet (75 mg total) by mouth daily. 30 tablet 12   doxepin (SINEQUAN) 25 MG capsule Take 50-75 mg by mouth at bedtime.     ferrous sulfate 325 (65 FE) MG tablet Take 1  tablet (325 mg total) by mouth daily. 30 tablet 3   furosemide (LASIX) 20 MG tablet TAKE ONE TABLET BY MOUTH DAILY 30 tablet 4   gabapentin (NEURONTIN) 300 MG capsule Take 300 mg by mouth at bedtime. 200-300 mg when needed     magnesium oxide (MAG-OX) 400 (240 Mg) MG tablet Take 1 tablet (400 mg total) by mouth daily. 30 tablet 0   metFORMIN (GLUCOPHAGE-XR) 500 MG 24 hr tablet Take 500 mg by mouth 2 (two) times daily.     nystatin (MYCOSTATIN/NYSTOP) powder Apply 1 Application topically 3 (three) times daily. 15 g 0   PARoxetine (PAXIL) 30 MG tablet Take 30 mg by mouth every morning.     No current facility-administered medications for this visit.    Past Medical History:  Diagnosis Date   Anxiety    Aortic atherosclerosis (HCC)    Arthritis    Cardiomyopathy (HCC)    a. 04/2021 Echo: EF 35-40%, glob HK. GrII DD.  Nl RV size/fxn. Mod dil LA. Mild MR.   Carotid arterial disease (HCC)    a. 04/2021 CTA Head/neck: RICA 60, LICA 55.   COPD (chronic obstructive pulmonary disease) (HCC)    Depression    Diabetes mellitus without complication (HCC)    History of kidney stones    Hypertension    Lung cancer (HCC)      Stroke (cerebrum) (HCC)    a. 04/2021 MRI brain: Acute infarct post limb of R internal capsule.   Thoracic aortic aneurysm (HCC)    Tobacco abuse     Past Surgical History:  Procedure Laterality Date   ABDOMINAL HYSTERECTOMY     CHOLECYSTECTOMY     EYE SURGERY Bilateral    IR KYPHO THORACIC WITH BONE BIOPSY  11/24/2021   IR RADIOLOGIST EVAL & MGMT  12/13/2021   KYPHOPLASTY N/A 07/30/2020   Procedure: T10 Kyphoplasty;  Surgeon: Menz, Michael, MD;  Location: [facility] ORS;  Service: Orthopedics;  Laterality: N/A;  T10   KYPHOPLASTY N/A 08/19/2020   Procedure: T9 YPHOPLASTY;  Surgeon: Menz, Michael, MD;  Location: [facility] OR[facility];  Service: Orthopedics;  Laterality: N/A;     Social History   Tobacco Use   Smoking status: Every Day    Packs/day: 0.50    Years: 68.00    Total pack  years: 34.00    Types: Cigarettes   Smokeless tobacco: Never  Vaping Use   Vaping Use: Never used  Substance Use Topics   Alcohol use: Never   Drug use: Never       Family History  Problem Relation Age of Onset   Dementia Mother    Congestive Heart Failure Mother    Bladder Cancer Father    Heart disease Father    Breast cancer Neg Hx      Allergies  Allergen Reactions   Farxiga [Dapagliflozin] Other (See Comments)    Recurring yeast infection   Sulfa Antibiotics Swelling    REVIEW OF SYSTEMS (Negative unless checked)   Constitutional: []Weight loss  []Fever  []Chills Cardiac: []Chest pain   []Chest pressure   []Palpitations   []Shortness of breath when laying flat   []Shortness of breath at rest   [x]Shortness of breath with exertion. Vascular:  []Pain in legs with walking   []Pain in legs at rest   []Pain in legs when laying flat   []Claudication   []Pain in feet when walking  []Pain in feet at rest  []Pain in feet when laying flat   []History of DVT   []Phlebitis   []Swelling in legs   []Varicose veins   [x]Non-healing ulcers Pulmonary:   []Uses home oxygen   []Productive cough   []Hemoptysis   []Wheeze  [x]COPD   []Asthma Neurologic:  []Dizziness  []Blackouts   []Seizures   [x]History of stroke   []History of TIA  []Aphasia   []Temporary blindness   []Dysphagia   []Weakness or numbness in arms   []Weakness or numbness in legs Musculoskeletal:  [x]Arthritis   []Joint swelling   []Joint pain   [x]Low back pain Hematologic:  []Easy bruising  []Easy bleeding   []Hypercoagulable state   []Anemic  []Hepatitis Gastrointestinal:  []Blood in stool   []Vomiting blood  [x]Gastroesophageal reflux/heartburn   []Abdominal pain Genitourinary:  []Chronic kidney disease   []Difficult urination  []Frequent urination  []Burning with urination   []Hematuria Skin:  []Rashes   [x]Ulcers   [x]Wounds Psychological:  [x]History of anxiety   [] History of major depression.  Physical  Examination  BP 122/69 (BP Location: Left Arm)   Pulse 88   Resp 18   Ht 5' 4" (1.626 m)   Wt 132 lb (59.9 kg)   BMI 22.66 kg/m  Gen:  WD/WN, NAD Head: [place]/AT, No temporalis wasting. Ear/Nose/Throat: Hearing grossly intact, nares w/o erythema or drainage Eyes: Conjunctiva clear. Sclera non-icteric Neck: Supple.  Trachea midline Pulmonary:    Good air movement, no use of accessory muscles.  Cardiac: RRR, no JVD Vascular:  Vessel Right Left  Radial Palpable Palpable                          PT Not Palpable Not Palpable  DP 1+ Palpable Not Palpable    Musculoskeletal: M/S 5/5 throughout.  No deformity or atrophy.  Wound on the left foot currently dressed.  1+ right lower extremity edema, 2+ left lower extremity edema. Neurologic: Sensation grossly intact in extremities.  Symmetrical.  Speech is fluent.  Psychiatric: Judgment intact, Mood & affect appropriate for pt's clinical situation. Dermatologic: Wound on the left foot currently dressed      Labs Recent Results (from the past 2160 hour(s))  CBC with Differential     Status: Abnormal   Collection Time: 05/19/22 12:59 PM  Result Value Ref Range   WBC 7.8 4.0 - 10.5 K/uL   RBC 3.91 3.87 - 5.11 MIL/uL   Hemoglobin 8.8 (L) 12.0 - 15.0 g/dL   HCT 30.9 (L) 36.0 - 46.0 %   MCV 79.0 (L) 80.0 - 100.0 fL   MCH 22.5 (L) 26.0 - 34.0 pg   MCHC 28.5 (L) 30.0 - 36.0 g/dL   RDW 18.1 (H) 11.5 - 15.5 %   Platelets 367 150 - 400 K/uL   nRBC 0.0 0.0 - 0.2 %   Neutrophils Relative % 73 %   Neutro Abs 5.7 1.7 - 7.7 K/uL   Lymphocytes Relative 14 %   Lymphs Abs 1.1 0.7 - 4.0 K/uL   Monocytes Relative 10 %   Monocytes Absolute 0.7 0.1 - 1.0 K/uL   Eosinophils Relative 3 %   Eosinophils Absolute 0.2 0.0 - 0.5 K/uL   Basophils Relative 0 %   Basophils Absolute 0.0 0.0 - 0.1 K/uL   Immature Granulocytes 0 %   Abs Immature Granulocytes 0.02 0.00 - 0.07 K/uL    Comment: Performed at [facility], [address]  [place], [place] [address]  Basic metabolic panel     Status: Abnormal   Collection Time: 05/19/22 12:59 PM  Result Value Ref Range   Sodium 140 135 - 145 mmol/L   Potassium 3.0 (L) 3.5 - 5.1 mmol/L   Chloride 106 98 - 111 mmol/L   CO2 25 22 - 32 mmol/L   Glucose, Bld 127 (H) 70 - 99 mg/dL    Comment: Glucose reference range applies only to samples taken after fasting for at least 8 hours.   BUN 16 8 - 23 mg/dL   Creatinine, Ser 0.72 0.44 - 1.00 mg/dL   Calcium 8.3 (L) 8.9 - 10.3 mg/dL   GFR, Estimated >60 >60 mL/min    Comment: (NOTE) Calculated using the CKD-EPI Creatinine Equation (2021)    Anion gap 9 5 - 15    Comment: Performed at [facility], [address] [place], [place] [address]  Troponin I (High Sensitivity)     Status: Abnormal   Collection Time: 05/19/22 12:59 PM  Result Value Ref Range   Troponin I (High Sensitivity) 25 (H) <18 ng/L    Comment: (NOTE) Elevated high sensitivity troponin I (hsTnI) values and significant  changes across serial measurements may suggest ACS but many other  chronic and acute conditions are known to elevate hsTnI results.  Refer to the "Links" section for chest pain algorithms and additional  guidance. Performed at [facility], [address] [place], [place] [address]     Urinalysis, Routine w reflex microscopic Urine, Clean Catch     Status: Abnormal   Collection Time: 05/19/22 12:59 PM  Result Value Ref Range   Color, Urine AMBER (A) YELLOW    Comment: BIOCHEMICALS MAY BE AFFECTED BY COLOR   APPearance CLOUDY (A) CLEAR   Specific Gravity, Urine 1.028 1.005 - 1.030   pH 5.0 5.0 - 8.0   Glucose, UA NEGATIVE NEGATIVE mg/dL   Hgb urine dipstick NEGATIVE NEGATIVE   Bilirubin Urine NEGATIVE NEGATIVE   Ketones, ur 5 (A) NEGATIVE mg/dL   Protein, ur 100 (A) NEGATIVE mg/dL   Nitrite NEGATIVE NEGATIVE   Leukocytes,Ua NEGATIVE NEGATIVE   RBC / HPF 0-5 0 - 5 RBC/hpf   WBC, UA 6-10 0 - 5 WBC/hpf   Bacteria, UA NONE SEEN  NONE SEEN   Squamous Epithelial / HPF 11-20 0 - 5 /HPF   Mucus PRESENT    Hyaline Casts, UA PRESENT     Comment: Performed at [facility], [address] [place], [place] [address]  Brain natriuretic peptide     Status: Abnormal   Collection Time: 05/19/22 12:59 PM  Result Value Ref Range   B Natriuretic Peptide 231.6 (H) 0.0 - 100.0 pg/mL    Comment: Performed at [facility], [address] [place], [place] [address]  Resp panel by RT-PCR (RSV, Flu A&B, Covid) Anterior Nasal Swab     Status: None   Collection Time: 05/19/22 12:59 PM   Specimen: Anterior Nasal Swab  Result Value Ref Range   SARS Coronavirus 2 by RT PCR NEGATIVE NEGATIVE    Comment: (NOTE) SARS-CoV-2 target nucleic acids are NOT DETECTED.  The SARS-CoV-2 RNA is generally detectable in upper respiratory specimens during the acute phase of infection. The lowest concentration of SARS-CoV-2 viral copies this assay can detect is 138 copies/mL. A negative result does not preclude SARS-Cov-2 infection and should not be used as the sole basis for treatment or other patient management decisions. A negative result may occur with  improper specimen collection/handling, submission of specimen other than nasopharyngeal swab, presence of viral mutation(s) within the areas targeted by this assay, and inadequate number of viral copies(<138 copies/mL). A negative result must be combined with clinical observations, patient history, and epidemiological information. The expected result is Negative.  Fact Sheet for Patients:  https://www.fda.gov/media/152166/download  Fact Sheet for Healthcare Providers:  https://www.fda.gov/media/152162/download  This test is no t yet approved or cleared by the [place] FDA and  has been authorized for detection and/or diagnosis of SARS-CoV-2 by FDA under an Emergency Use Authorization (EUA). This EUA will remain  in effect (meaning this test can be used) for the  duration of the COVID-19 declaration under Section 564(b)(1) of the Act, 21 U.S.C.section 360bbb-3(b)(1), unless the authorization is terminated  or revoked sooner.       Influenza A by PCR NEGATIVE NEGATIVE   Influenza B by PCR NEGATIVE NEGATIVE    Comment: (NOTE) The Xpert Xpress SARS-CoV-2/FLU/RSV plus assay is intended as an aid in the diagnosis of influenza from Nasopharyngeal swab specimens and should not be used as a sole basis for treatment. Nasal washings and aspirates are unacceptable for Xpert Xpress SARS-CoV-2/FLU/RSV testing.  Fact Sheet for Patients: https://www.fda.gov/media/152166/download  Fact Sheet for Healthcare Providers: https://www.fda.gov/media/152162/download  This test is not yet approved or cleared by the [place] FDA and has been authorized for detection and/or diagnosis of SARS-CoV-2 by FDA under an Emergency Use Authorization (EUA). This EUA will remain in effect (  meaning this test can be used) for the duration of the COVID-19 declaration under Section 564(b)(1) of the Act, 21 U.S.C. section 360bbb-3(b)(1), unless the authorization is terminated or revoked.     Resp Syncytial Virus by PCR NEGATIVE NEGATIVE    Comment: (NOTE) Fact Sheet for Patients: https://www.fda.gov/media/152166/download  Fact Sheet for Healthcare Providers: https://www.fda.gov/media/152162/download  This test is not yet approved or cleared by the [place] FDA and has been authorized for detection and/or diagnosis of SARS-CoV-2 by FDA under an Emergency Use Authorization (EUA). This EUA will remain in effect (meaning this test can be used) for the duration of the COVID-19 declaration under Section 564(b)(1) of the Act, 21 U.S.C. section 360bbb-3(b)(1), unless the authorization is terminated or revoked.  Performed at [facility], [address] [place], [place] [address]   CK     Status: None   Collection Time: 05/19/22 12:59 PM  Result Value  Ref Range   Total CK 145 38 - 234 U/L    Comment: Performed at [facility], [address] [place], [place] [address]  Troponin I (High Sensitivity)     Status: Abnormal   Collection Time: 05/19/22  2:55 PM  Result Value Ref Range   Troponin I (High Sensitivity) 51 (H) <18 ng/L    Comment: READ BACK AND VERIFIED WITH ASHLEY ELLWINGER 05/19/22 @ 1744 BY SB (NOTE) Elevated high sensitivity troponin I (hsTnI) values and significant  changes across serial measurements may suggest ACS but many other  chronic and acute conditions are known to elevate hsTnI results.  Refer to the "Links" section for chest pain algorithms and additional  guidance. Performed at [facility], [address] [place], [place] [address]   Blood gas, venous     Status: Abnormal   Collection Time: 05/19/22  9:29 PM  Result Value Ref Range   pH, Ven 7.4 7.25 - 7.43   pCO2, Ven 48 44 - 60 mmHg   Bicarbonate 29.7 (H) 20.0 - 28.0 mmol/L   Acid-Base Excess 4.0 (H) 0.0 - 2.0 mmol/L   O2 Saturation 21.9 %   Patient temperature 37.0    Collection site VEIN     Comment: Performed at [facility], [address] [place], [place] [address]  B Nat Peptide     Status: None   Collection Time: 06/09/22  3:19 PM  Result Value Ref Range   B Natriuretic Peptide 92.9 0.0 - 100.0 pg/mL    Comment: Performed at [facility], [address] [place], [place] [address]  Basic metabolic panel     Status: Abnormal   Collection Time: 06/09/22  3:19 PM  Result Value Ref Range   Sodium 137 135 - 145 mmol/L   Potassium 4.2 3.5 - 5.1 mmol/L   Chloride 103 98 - 111 mmol/L   CO2 27 22 - 32 mmol/L   Glucose, Bld 109 (H) 70 - 99 mg/dL    Comment: Glucose reference range applies only to samples taken after fasting for at least 8 hours.   BUN 12 8 - 23 mg/dL   Creatinine, Ser 0.59 0.44 - 1.00 mg/dL   Calcium 8.5 (L) 8.9 - 10.3 mg/dL   GFR, Estimated >60 >60 mL/min    Comment: (NOTE) Calculated  using the CKD-EPI Creatinine Equation (2021)    Anion gap 7 5 - 15    Comment: Performed at [facility], [address] [place], [place] [address]    Radiology VAS US ABI WITH/WO   TBI  Result Date: 06/30/2022  LOWER EXTREMITY DOPPLER STUDY Patient Name:  Marissa Fletcher  Date of Exam:   06/22/2022 Medical Rec #: [id number]        Accession #:    [id number] Date of Birth: 04/30/1941        Patient Gender: F Patient Age:   81 years Exam Location:  [facility] Procedure:      VAS US ABI WITH/WO TBI Referring Phys: --------------------------------------------------------------------------------  Indications: Peripheral artery disease. lt 2nd toe infection  Performing Technologist: Terry Knight RVT  Examination Guidelines: A complete evaluation includes at minimum, Doppler waveform signals and systolic blood pressure reading at the level of bilateral brachial, anterior tibial, and posterior tibial arteries, when vessel segments are accessible. Bilateral testing is considered an integral part of a complete examination. Photoelectric Plethysmograph (PPG) waveforms and toe systolic pressure readings are included as required and additional duplex testing as needed. Limited examinations for reoccurring indications may be performed as noted.  ABI Findings: +---------+------------------+-----+--------+--------+ Right    Rt Pressure (mmHg)IndexWaveformComment  +---------+------------------+-----+--------+--------+ Brachial 159                                     +---------+------------------+-----+--------+--------+ ATA      107               0.67 biphasic         +---------+------------------+-----+--------+--------+ PTA      106               0.67 biphasic         +---------+------------------+-----+--------+--------+ Great Toe64                0.40 Abnormal         +---------+------------------+-----+--------+--------+  +---------+------------------+-----+----------+-------+ Left     Lt Pressure (mmHg)IndexWaveform  Comment +---------+------------------+-----+----------+-------+ ATA      103               0.65 monophasic        +---------+------------------+-----+----------+-------+ PTA      108               0.68 monophasic        +---------+------------------+-----+----------+-------+ Great Toe75                0.47 Abnormal          +---------+------------------+-----+----------+-------+  Summary: Right: Resting right ankle-brachial index indicates moderate right lower extremity arterial disease. The right toe-brachial index is abnormal. Left: Resting left ankle-brachial index indicates moderate left lower extremity arterial disease. The left toe-brachial index is abnormal. *See table(s) above for measurements and observations.  Electronically signed by Zaiah Credeur MD on 06/30/2022 at 9:56:39 AM.    Final     Assessment/Plan HLD (hyperlipidemia) lipid control important in reducing the progression of atherosclerotic disease. Continue statin therapy     Type 2 diabetes mellitus with hyperlipidemia (HCC) blood glucose control important in reducing the progression of atherosclerotic disease. Also, involved in wound healing. On appropriate medications.     Essential hypertension blood pressure control important in reducing the progression of atherosclerotic disease. On appropriate oral medications.  Atherosclerosis of native arteries of the extremities with ulceration (HCC) The patient has a nonhealing ulceration of the left foot with known significant reduction in her ABI on the left.  This is clearly a critical and limb threatening situation.  After discussions with she and her family today,   they would like to go ahead and proceed with revascularization.  We have discussed this previously and discussed the procedure again in detail today answering all of their questions.  We discussed that limb  loss is what we are trying to avoid but there is still a chance of limb loss even with successful revascularization.  We discussed the risks and benefits of the procedures.  They are agreeable to proceed.  AAA (abdominal aortic aneurysm) without rupture (HCC) Recently checked and stable.  Could be a potential nidus for embolization but should improve perfusion before considering repair of this.  Carotid stenosis Recently checked and stable.     Violet Cart, MD  07/18/2022 11:32 AM    This note was created with Dragon medical transcription system.  Any errors from dictation are purely unintentional 

## 2022-07-18 NOTE — Assessment & Plan Note (Signed)
The patient has a nonhealing ulceration of the left foot with known significant reduction in her ABI on the left.  This is clearly a critical and limb threatening situation.  After discussions with she and her family today, they would like to go ahead and proceed with revascularization.  We have discussed this previously and discussed the procedure again in detail today answering all of their questions.  We discussed that limb loss is what we are trying to avoid but there is still a chance of limb loss even with successful revascularization.  We discussed the risks and benefits of the procedures.  They are agreeable to proceed.

## 2022-07-18 NOTE — Assessment & Plan Note (Signed)
Recently checked and stable.  Could be a potential nidus for embolization but should improve perfusion before considering repair of this.

## 2022-07-18 NOTE — Progress Notes (Signed)
MRN : [id number]  Marissa Fletcher is a 82 y.o. (June 18, 1940) female who presents with chief complaint of  Chief Complaint  Patient presents with   Follow-up    office visit for blockage and procedure questions  .  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  She was recently seen and we have followed her for her carotid disease and her abdominal aortic aneurysm.  These were recently checked with noninvasive studies.  She has known significant malperfusion of the left lower extremity with a nonhealing ulceration.  She follows with podiatry who saw her recently and asked her to rediscuss revascularization with [place].  Overall, her ulcer is doing okay and seems to be healing very slowly.  She has a liver lesion is scheduled to see a GI doctor in the coming months.  She is having a lot of pain in the left foot as well as swelling in both lower extremities.  Her activity level is quite poor at this point  Current Outpatient Medications  Medication Sig Dispense Refill   acetaminophen (TYLENOL) 500 MG tablet Take 2 tablets (1,000 mg total) by mouth 3 (three) times daily as needed for mild pain, moderate pain or headache. 30 tablet 0   ALPRAZolam (XANAX) 0.25 MG tablet Take 1 tablet (0.25 mg total) by mouth daily as needed for anxiety. 10 tablet 0   aspirin EC 81 MG EC tablet Take 1 tablet (81 mg total) by mouth daily. Swallow whole. 30 tablet 11   atorvastatin (LIPITOR) 20 MG tablet Take 20 mg by mouth every Monday, Wednesday, and Friday.     carvedilol (COREG) 6.25 MG tablet Take 1 tablet (6.25 mg total) by mouth 2 (two) times daily with a meal. 60 tablet 2   clopidogrel (PLAVIX) 75 MG tablet Take 1 tablet (75 mg total) by mouth daily. 30 tablet 12   doxepin (SINEQUAN) 25 MG capsule Take 50-75 mg by mouth at bedtime.     ferrous sulfate 325 (65 FE) MG tablet Take 1  tablet (325 mg total) by mouth daily. 30 tablet 3   furosemide (LASIX) 20 MG tablet TAKE ONE TABLET BY MOUTH DAILY 30 tablet 4   gabapentin (NEURONTIN) 300 MG capsule Take 300 mg by mouth at bedtime. 200-300 mg when needed     magnesium oxide (MAG-OX) 400 (240 Mg) MG tablet Take 1 tablet (400 mg total) by mouth daily. 30 tablet 0   metFORMIN (GLUCOPHAGE-XR) 500 MG 24 hr tablet Take 500 mg by mouth 2 (two) times daily.     nystatin (MYCOSTATIN/NYSTOP) powder Apply 1 Application topically 3 (three) times daily. 15 g 0   PARoxetine (PAXIL) 30 MG tablet Take 30 mg by mouth every morning.     No current facility-administered medications for this visit.    Past Medical History:  Diagnosis Date   Anxiety    Aortic atherosclerosis ([place])    Arthritis    Cardiomyopathy ([place])    a. 04/2021 Echo: EF 35-40%, glob HK. GrII DD.  Nl RV size/fxn. Mod dil LA. Mild MR.   Carotid arterial disease ([place])    a. 04/2021 CTA Head/neck: RICA 60, LICA 55.   COPD (chronic obstructive pulmonary disease) (HCC)    Depression    Diabetes mellitus without complication ([place])    History of kidney stones    Hypertension    Lung cancer ([place])  Stroke (cerebrum) ([place])    a. 04/2021 MRI brain: Acute infarct post limb of R internal capsule.   Thoracic aortic aneurysm ([place])    Tobacco abuse     Past Surgical History:  Procedure Laterality Date   ABDOMINAL HYSTERECTOMY     CHOLECYSTECTOMY     EYE SURGERY Bilateral    IR KYPHO THORACIC WITH BONE BIOPSY  11/24/2021   IR RADIOLOGIST EVAL & MGMT  12/13/2021   KYPHOPLASTY N/A 07/30/2020   Procedure: T10 Kyphoplasty;  Surgeon: Hessie Knows, MD;  Location: [facility] ORS;  Service: Orthopedics;  Laterality: N/A;  T10   KYPHOPLASTY N/A 08/19/2020   Procedure: T9 YPHOPLASTY;  Surgeon: Hessie Knows, MD;  Location: [facility] ORS;  Service: Orthopedics;  Laterality: N/A;     Social History   Tobacco Use   Smoking status: Every Day    Packs/day: 0.50    Years: 68.00    Total pack  years: 34.00    Types: Cigarettes   Smokeless tobacco: Never  Vaping Use   Vaping Use: Never used  Substance Use Topics   Alcohol use: Never   Drug use: Never       Family History  Problem Relation Age of Onset   Dementia Mother    Congestive Heart Failure Mother    Bladder Cancer Father    Heart disease Father    Breast cancer Neg Hx      Allergies  Allergen Reactions   Farxiga [Dapagliflozin] Other (See Comments)    Recurring yeast infection   Sulfa Antibiotics Swelling    REVIEW OF SYSTEMS (Negative unless checked)   Constitutional: '[]'$ Weight loss  '[]'$ Fever  '[]'$ Chills Cardiac: '[]'$ Chest pain   '[]'$ Chest pressure   '[]'$ Palpitations   '[]'$ Shortness of breath when laying flat   '[]'$ Shortness of breath at rest   '[x]'$ Shortness of breath with exertion. Vascular:  '[]'$ Pain in legs with walking   '[]'$ Pain in legs at rest   '[]'$ Pain in legs when laying flat   '[]'$ Claudication   '[]'$ Pain in feet when walking  '[]'$ Pain in feet at rest  '[]'$ Pain in feet when laying flat   '[]'$ History of DVT   '[]'$ Phlebitis   '[]'$ Swelling in legs   '[]'$ Varicose veins   '[x]'$ Non-healing ulcers Pulmonary:   '[]'$ Uses home oxygen   '[]'$ Productive cough   '[]'$ Hemoptysis   '[]'$ Wheeze  '[x]'$ COPD   '[]'$ Asthma Neurologic:  '[]'$ Dizziness  '[]'$ Blackouts   '[]'$ Seizures   '[x]'$ History of stroke   '[]'$ History of TIA  '[]'$ Aphasia   '[]'$ Temporary blindness   '[]'$ Dysphagia   '[]'$ Weakness or numbness in arms   '[]'$ Weakness or numbness in legs Musculoskeletal:  '[x]'$ Arthritis   '[]'$ Joint swelling   '[]'$ Joint pain   '[x]'$ Low back pain Hematologic:  '[]'$ Easy bruising  '[]'$ Easy bleeding   '[]'$ Hypercoagulable state   '[]'$ Anemic  '[]'$ Hepatitis Gastrointestinal:  '[]'$ Blood in stool   '[]'$ Vomiting blood  '[x]'$ Gastroesophageal reflux/heartburn   '[]'$ Abdominal pain Genitourinary:  '[]'$ Chronic kidney disease   '[]'$ Difficult urination  '[]'$ Frequent urination  '[]'$ Burning with urination   '[]'$ Hematuria Skin:  '[]'$ Rashes   '[x]'$ Ulcers   '[x]'$ Wounds Psychological:  '[x]'$ History of anxiety   '[]'$  History of major depression.  Physical  Examination  BP 122/69 (BP Location: Left Arm)   Pulse 88   Resp 18   Ht '5\' 4"'$  (1.626 m)   Wt 132 lb (59.9 kg)   BMI 22.66 kg/m  Gen:  WD/WN, NAD Head: [place]/AT, No temporalis wasting. Ear/Nose/Throat: Hearing grossly intact, nares w/o erythema or drainage Eyes: Conjunctiva clear. Sclera non-icteric Neck: Supple.  Trachea midline Pulmonary:  Good air movement, no use of accessory muscles.  Cardiac: RRR, no JVD Vascular:  Vessel Right Left  Radial Palpable Palpable                          PT Not Palpable Not Palpable  DP 1+ Palpable Not Palpable    Musculoskeletal: M/S 5/5 throughout.  No deformity or atrophy.  Wound on the left foot currently dressed.  1+ right lower extremity edema, 2+ left lower extremity edema. Neurologic: Sensation grossly intact in extremities.  Symmetrical.  Speech is fluent.  Psychiatric: Judgment intact, Mood & affect appropriate for pt's clinical situation. Dermatologic: Wound on the left foot currently dressed      Labs Recent Results (from the past 2160 hour(s))  CBC with Differential     Status: Abnormal   Collection Time: 05/19/22 12:59 PM  Result Value Ref Range   WBC 7.8 4.0 - 10.5 K/uL   RBC 3.91 3.87 - 5.11 MIL/uL   Hemoglobin 8.8 (L) 12.0 - 15.0 g/dL   HCT 30.9 (L) 36.0 - 46.0 %   MCV 79.0 (L) 80.0 - 100.0 fL   MCH 22.5 (L) 26.0 - 34.0 pg   MCHC 28.5 (L) 30.0 - 36.0 g/dL   RDW 18.1 (H) 11.5 - 15.5 %   Platelets 367 150 - 400 K/uL   nRBC 0.0 0.0 - 0.2 %   Neutrophils Relative % 73 %   Neutro Abs 5.7 1.7 - 7.7 K/uL   Lymphocytes Relative 14 %   Lymphs Abs 1.1 0.7 - 4.0 K/uL   Monocytes Relative 10 %   Monocytes Absolute 0.7 0.1 - 1.0 K/uL   Eosinophils Relative 3 %   Eosinophils Absolute 0.2 0.0 - 0.5 K/uL   Basophils Relative 0 %   Basophils Absolute 0.0 0.0 - 0.1 K/uL   Immature Granulocytes 0 %   Abs Immature Granulocytes 0.02 0.00 - 0.07 K/uL    Comment: Performed at [facility], [address]  [place], [place] [id number]  Basic metabolic panel     Status: Abnormal   Collection Time: 05/19/22 12:59 PM  Result Value Ref Range   Sodium 140 135 - 145 mmol/L   Potassium 3.0 (L) 3.5 - 5.1 mmol/L   Chloride 106 98 - 111 mmol/L   CO2 25 22 - 32 mmol/L   Glucose, Bld 127 (H) 70 - 99 mg/dL    Comment: Glucose reference range applies only to samples taken after fasting for at least 8 hours.   BUN 16 8 - 23 mg/dL   Creatinine, Ser 0.72 0.44 - 1.00 mg/dL   Calcium 8.3 (L) 8.9 - 10.3 mg/dL   GFR, Estimated >60 >60 mL/min    Comment: (NOTE) Calculated using the CKD-EPI Creatinine Equation (2021)    Anion gap 9 5 - 15    Comment: Performed at [facility], [address] [place], [place] [address]  Troponin I (High Sensitivity)     Status: Abnormal   Collection Time: 05/19/22 12:59 PM  Result Value Ref Range   Troponin I (High Sensitivity) 25 (H) <18 ng/L    Comment: (NOTE) Elevated high sensitivity troponin I (hsTnI) values and significant  changes across serial measurements may suggest ACS but many other  chronic and acute conditions are known to elevate hsTnI results.  Refer to the "Links" section for chest pain algorithms and additional  guidance. Performed at [facility], [address], [place], [place] [address]  Urinalysis, Routine w reflex microscopic Urine, Clean Catch     Status: Abnormal   Collection Time: 05/19/22 12:59 PM  Result Value Ref Range   Color, Urine AMBER (A) YELLOW    Comment: BIOCHEMICALS MAY BE AFFECTED BY COLOR   APPearance CLOUDY (A) CLEAR   Specific Gravity, Urine 1.028 1.005 - 1.030   pH 5.0 5.0 - 8.0   Glucose, UA NEGATIVE NEGATIVE mg/dL   Hgb urine dipstick NEGATIVE NEGATIVE   Bilirubin Urine NEGATIVE NEGATIVE   Ketones, ur 5 (A) NEGATIVE mg/dL   Protein, ur 100 (A) NEGATIVE mg/dL   Nitrite NEGATIVE NEGATIVE   Leukocytes,Ua NEGATIVE NEGATIVE   RBC / HPF 0-5 0 - 5 RBC/hpf   WBC, UA 6-10 0 - 5 WBC/hpf   Bacteria, UA NONE SEEN  NONE SEEN   Squamous Epithelial / HPF 11-20 0 - 5 /HPF   Mucus PRESENT    Hyaline Casts, UA PRESENT     Comment: Performed at [facility], [address] [place], [place] [address]  Brain natriuretic peptide     Status: Abnormal   Collection Time: 05/19/22 12:59 PM  Result Value Ref Range   B Natriuretic Peptide 231.6 (H) 0.0 - 100.0 pg/mL    Comment: Performed at [facility], [address] [place], [place] [address]  Resp panel by RT-PCR (RSV, Flu A&B, Covid) Anterior Nasal Swab     Status: None   Collection Time: 05/19/22 12:59 PM   Specimen: Anterior Nasal Swab  Result Value Ref Range   SARS Coronavirus 2 by RT PCR NEGATIVE NEGATIVE    Comment: (NOTE) SARS-CoV-2 target nucleic acids are NOT DETECTED.  The SARS-CoV-2 RNA is generally detectable in upper respiratory specimens during the acute phase of infection. The lowest concentration of SARS-CoV-2 viral copies this assay can detect is 138 copies/mL. A negative result does not preclude SARS-Cov-2 infection and should not be used as the sole basis for treatment or other patient management decisions. A negative result may occur with  improper specimen collection/handling, submission of specimen other than nasopharyngeal swab, presence of viral mutation(s) within the areas targeted by this assay, and inadequate number of viral copies(<138 copies/mL). A negative result must be combined with clinical observations, patient history, and epidemiological information. The expected result is Negative.  Fact Sheet for Patients:  EntrepreneurPulse.com.au  Fact Sheet for Healthcare Providers:  IncredibleEmployment.be  This test is no t yet approved or cleared by the [place] FDA and  has been authorized for detection and/or diagnosis of SARS-CoV-2 by FDA under an Emergency Use Authorization (EUA). This EUA will remain  in effect (meaning this test can be used) for the  duration of the COVID-19 declaration under Section 564(b)(1) of the Act, 21 U.S.C.section 360bbb-3(b)(1), unless the authorization is terminated  or revoked sooner.       Influenza A by PCR NEGATIVE NEGATIVE   Influenza B by PCR NEGATIVE NEGATIVE    Comment: (NOTE) The Xpert Xpress SARS-CoV-2/FLU/RSV plus assay is intended as an aid in the diagnosis of influenza from Nasopharyngeal swab specimens and should not be used as a sole basis for treatment. Nasal washings and aspirates are unacceptable for Xpert Xpress SARS-CoV-2/FLU/RSV testing.  Fact Sheet for Patients: EntrepreneurPulse.com.au  Fact Sheet for Healthcare Providers: IncredibleEmployment.be  This test is not yet approved or cleared by the [place] FDA and has been authorized for detection and/or diagnosis of SARS-CoV-2 by FDA under an Emergency Use Authorization (EUA). This EUA will remain in effect (  meaning this test can be used) for the duration of the COVID-19 declaration under Section 564(b)(1) of the Act, 21 U.S.C. section 360bbb-3(b)(1), unless the authorization is terminated or revoked.     Resp Syncytial Virus by PCR NEGATIVE NEGATIVE    Comment: (NOTE) Fact Sheet for Patients: EntrepreneurPulse.com.au  Fact Sheet for Healthcare Providers: IncredibleEmployment.be  This test is not yet approved or cleared by the [place] FDA and has been authorized for detection and/or diagnosis of SARS-CoV-2 by FDA under an Emergency Use Authorization (EUA). This EUA will remain in effect (meaning this test can be used) for the duration of the COVID-19 declaration under Section 564(b)(1) of the Act, 21 U.S.C. section 360bbb-3(b)(1), unless the authorization is terminated or revoked.  Performed at [facility], [address] [place], [place] [address]   CK     Status: None   Collection Time: 05/19/22 12:59 PM  Result Value  Ref Range   Total CK 145 38 - 234 U/L    Comment: Performed at [facility], [address] [place], [place] [address]  Troponin I (High Sensitivity)     Status: Abnormal   Collection Time: 05/19/22  2:55 PM  Result Value Ref Range   Troponin I (High Sensitivity) 51 (H) <18 ng/L    Comment: READ BACK AND VERIFIED WITH ASHLEY ELLWINGER 05/19/22 @ 1744 BY SB (NOTE) Elevated high sensitivity troponin I (hsTnI) values and significant  changes across serial measurements may suggest ACS but many other  chronic and acute conditions are known to elevate hsTnI results.  Refer to the "Links" section for chest pain algorithms and additional  guidance. Performed at [facility], [address] [place], [place] [address]   Blood gas, venous     Status: Abnormal   Collection Time: 05/19/22  9:29 PM  Result Value Ref Range   pH, Ven 7.4 7.25 - 7.43   pCO2, Ven 48 44 - 60 mmHg   Bicarbonate 29.7 (H) 20.0 - 28.0 mmol/L   Acid-Base Excess 4.0 (H) 0.0 - 2.0 mmol/L   O2 Saturation 21.9 %   Patient temperature 37.0    Collection site VEIN     Comment: Performed at [facility], [address] [place], [place] [address]  B Nat Peptide     Status: None   Collection Time: 06/09/22  3:19 PM  Result Value Ref Range   B Natriuretic Peptide 92.9 0.0 - 100.0 pg/mL    Comment: Performed at [facility], [address] [place], [place] [id number]  Basic metabolic panel     Status: Abnormal   Collection Time: 06/09/22  3:19 PM  Result Value Ref Range   Sodium 137 135 - 145 mmol/L   Potassium 4.2 3.5 - 5.1 mmol/L   Chloride 103 98 - 111 mmol/L   CO2 27 22 - 32 mmol/L   Glucose, Bld 109 (H) 70 - 99 mg/dL    Comment: Glucose reference range applies only to samples taken after fasting for at least 8 hours.   BUN 12 8 - 23 mg/dL   Creatinine, Ser 0.59 0.44 - 1.00 mg/dL   Calcium 8.5 (L) 8.9 - 10.3 mg/dL   GFR, Estimated >60 >60 mL/min    Comment: (NOTE) Calculated  using the CKD-EPI Creatinine Equation (2021)    Anion gap 7 5 - 15    Comment: Performed at [facility], [address], [place], [place] [address]    Radiology VAS [place] ABI WITH/WO  TBI  Result Date: 06/30/2022  LOWER EXTREMITY DOPPLER STUDY Patient Name:  MAKITA RIVARD  Date of Exam:   06/22/2022 Medical Rec #: [id number]        Accession #:    [id number] Date of Birth: 03/26/1941        Patient Gender: F Patient Age:   23 years Exam Location:  [facility] Procedure:      VAS [place] ABI WITH/WO TBI Referring Phys: --------------------------------------------------------------------------------  Indications: Peripheral artery disease. lt 2nd toe infection  Performing Technologist: Concha Norway RVT  Examination Guidelines: A complete evaluation includes at minimum, Doppler waveform signals and systolic blood pressure reading at the level of bilateral brachial, anterior tibial, and posterior tibial arteries, when vessel segments are accessible. Bilateral testing is considered an integral part of a complete examination. Photoelectric Plethysmograph (PPG) waveforms and toe systolic pressure readings are included as required and additional duplex testing as needed. Limited examinations for reoccurring indications may be performed as noted.  ABI Findings: +---------+------------------+-----+--------+--------+ Right    Rt Pressure (mmHg)IndexWaveformComment  +---------+------------------+-----+--------+--------+ Brachial 159                                     +---------+------------------+-----+--------+--------+ ATA      107               0.67 biphasic         +---------+------------------+-----+--------+--------+ PTA      106               0.67 biphasic         +---------+------------------+-----+--------+--------+ Great Toe64                0.40 Abnormal         +---------+------------------+-----+--------+--------+  +---------+------------------+-----+----------+-------+ Left     Lt Pressure (mmHg)IndexWaveform  Comment +---------+------------------+-----+----------+-------+ ATA      103               0.65 monophasic        +---------+------------------+-----+----------+-------+ PTA      108               0.68 monophasic        +---------+------------------+-----+----------+-------+ Great Toe75                0.47 Abnormal          +---------+------------------+-----+----------+-------+  Summary: Right: Resting right ankle-brachial index indicates moderate right lower extremity arterial disease. The right toe-brachial index is abnormal. Left: Resting left ankle-brachial index indicates moderate left lower extremity arterial disease. The left toe-brachial index is abnormal. *See table(s) above for measurements and observations.  Electronically signed by Leotis Pain MD on 06/30/2022 at 29:56:39 AM.    Final     Assessment/Plan HLD (hyperlipidemia) lipid control important in reducing the progression of atherosclerotic disease. Continue statin therapy     Type 2 diabetes mellitus with hyperlipidemia (HCC) blood glucose control important in reducing the progression of atherosclerotic disease. Also, involved in wound healing. On appropriate medications.     Essential hypertension blood pressure control important in reducing the progression of atherosclerotic disease. On appropriate oral medications.  Atherosclerosis of native arteries of the extremities with ulceration ([place]) The patient has a nonhealing ulceration of the left foot with known significant reduction in her ABI on the left.  This is clearly a critical and limb threatening situation.  After discussions with she and her family today,  they would like to go ahead and proceed with revascularization.  We have discussed this previously and discussed the procedure again in detail today answering all of their questions.  We discussed that limb  loss is what we are trying to avoid but there is still a chance of limb loss even with successful revascularization.  We discussed the risks and benefits of the procedures.  They are agreeable to proceed.  AAA (abdominal aortic aneurysm) without rupture (HCC) Recently checked and stable.  Could be a potential nidus for embolization but should improve perfusion before considering repair of this.  Carotid stenosis Recently checked and stable.     Leotis Pain, MD  07/18/2022 11:32 AM    This note was created with Dragon medical transcription system.  Any errors from dictation are purely unintentional

## 2022-07-28 ENCOUNTER — Encounter (INDEPENDENT_AMBULATORY_CARE_PROVIDER_SITE_OTHER): Payer: Self-pay | Admitting: Vascular Surgery

## 2022-08-01 ENCOUNTER — Telehealth (INDEPENDENT_AMBULATORY_CARE_PROVIDER_SITE_OTHER): Payer: Self-pay

## 2022-08-01 NOTE — Telephone Encounter (Signed)
Spoke with the patient's sister per the information given. Patient is scheduled on 08/07/22 for a LLE angio with Dr. Lucky Cowboy with a 8:30 am arrival time to the [facility]. Pre-procedure instructions were discussed and will be sent to Mychart and mailed.

## 2022-08-07 ENCOUNTER — Encounter: Payer: Self-pay | Admitting: Vascular Surgery

## 2022-08-07 ENCOUNTER — Other Ambulatory Visit: Payer: Self-pay

## 2022-08-07 ENCOUNTER — Encounter
Admission: RE | Disposition: A | Discharge status: Home / Self Care | Payer: Self-pay | Source: Ambulatory Visit | Attending: Vascular Surgery

## 2022-08-07 ENCOUNTER — Ambulatory Visit
Admission: RE | Admit: 2022-08-07 | Discharge: 2022-08-07 | Disposition: A | Discharge status: Home / Self Care | Payer: Medicare Other | Source: Ambulatory Visit | Attending: Vascular Surgery | Admitting: Vascular Surgery

## 2022-08-07 DIAGNOSIS — Z7984 Long term (current) use of oral hypoglycemic drugs: Secondary | ICD-10-CM | POA: Insufficient documentation

## 2022-08-07 DIAGNOSIS — I10 Essential (primary) hypertension: Secondary | ICD-10-CM | POA: Insufficient documentation

## 2022-08-07 DIAGNOSIS — E785 Hyperlipidemia, unspecified: Secondary | ICD-10-CM | POA: Diagnosis not present

## 2022-08-07 DIAGNOSIS — E11621 Type 2 diabetes mellitus with foot ulcer: Secondary | ICD-10-CM | POA: Insufficient documentation

## 2022-08-07 DIAGNOSIS — L97529 Non-pressure chronic ulcer of other part of left foot with unspecified severity: Secondary | ICD-10-CM | POA: Diagnosis not present

## 2022-08-07 DIAGNOSIS — I70245 Atherosclerosis of native arteries of left leg with ulceration of other part of foot: Secondary | ICD-10-CM

## 2022-08-07 DIAGNOSIS — F17210 Nicotine dependence, cigarettes, uncomplicated: Secondary | ICD-10-CM | POA: Insufficient documentation

## 2022-08-07 DIAGNOSIS — E1169 Type 2 diabetes mellitus with other specified complication: Secondary | ICD-10-CM | POA: Diagnosis not present

## 2022-08-07 DIAGNOSIS — I779 Disorder of arteries and arterioles, unspecified: Secondary | ICD-10-CM | POA: Diagnosis not present

## 2022-08-07 DIAGNOSIS — I700 Atherosclerosis of aorta: Secondary | ICD-10-CM

## 2022-08-07 DIAGNOSIS — I70201 Unspecified atherosclerosis of native arteries of extremities, right leg: Secondary | ICD-10-CM | POA: Diagnosis not present

## 2022-08-07 DIAGNOSIS — I714 Abdominal aortic aneurysm, without rupture, unspecified: Secondary | ICD-10-CM | POA: Diagnosis not present

## 2022-08-07 DIAGNOSIS — L97909 Non-pressure chronic ulcer of unspecified part of unspecified lower leg with unspecified severity: Secondary | ICD-10-CM

## 2022-08-07 DIAGNOSIS — I7025 Atherosclerosis of native arteries of other extremities with ulceration: Secondary | ICD-10-CM | POA: Insufficient documentation

## 2022-08-07 HISTORY — PX: LOWER EXTREMITY ANGIOGRAPHY: CATH118251

## 2022-08-07 LAB — CREATININE, SERUM
Creatinine, Ser: 0.61 mg/dL (ref 0.44–1.00)
GFR, Estimated: >60 mL/min (ref >60)

## 2022-08-07 LAB — GLUCOSE, CAPILLARY
Glucose-Capillary: 111 mg/dL — ABNORMAL HIGH (ref 70–99)
Glucose-Capillary: 116 mg/dL — ABNORMAL HIGH (ref 70–99)

## 2022-08-07 LAB — BUN: BUN: 12 mg/dL (ref 8–23)

## 2022-08-07 SURGERY — LOWER EXTREMITY ANGIOGRAPHY
Anesthesia: Moderate Sedation | Site: Leg Lower | Laterality: Left

## 2022-08-07 MED ORDER — CEFAZOLIN SODIUM-DEXTROSE 1-4 GM/50ML-% IV SOLN
INTRAVENOUS | Status: DC | PRN
  Administered 2022-08-07: 2 g via INTRAVENOUS

## 2022-08-07 MED ORDER — NITROGLYCERIN 1 MG/10 ML FOR IR/CATH LAB
INTRA_ARTERIAL | Status: AC
  Filled 2022-08-07: qty 10

## 2022-08-07 MED ORDER — ACETAMINOPHEN 325 MG PO TABS
ORAL_TABLET | ORAL | Status: AC
  Filled 2022-08-07: qty 2

## 2022-08-07 MED ORDER — HYDRALAZINE HCL 20 MG/ML IJ SOLN: 5.0000 mg | INTRAMUSCULAR | Status: DC | PRN

## 2022-08-07 MED ORDER — SODIUM CHLORIDE 0.9 % IV SOLN: INTRAVENOUS | Status: DC

## 2022-08-07 MED ORDER — CLOPIDOGREL BISULFATE 75 MG PO TABS: 75.0000 mg | ORAL_TABLET | Freq: Every day | ORAL | Status: DC

## 2022-08-07 MED ORDER — SODIUM CHLORIDE 0.9 % IV SOLN: 250.0000 mL | INTRAVENOUS | Status: DC | PRN

## 2022-08-07 MED ORDER — CEFAZOLIN SODIUM-DEXTROSE 2-4 GM/100ML-% IV SOLN
INTRAVENOUS | Status: AC
  Filled 2022-08-07: qty 100

## 2022-08-07 MED ORDER — HYDROMORPHONE HCL 1 MG/ML IJ SOLN: 1.0000 mg | Freq: Once | INTRAMUSCULAR | Status: DC | PRN

## 2022-08-07 MED ORDER — IODIXANOL 320 MG/ML IV SOLN
INTRAVENOUS | Status: DC | PRN
  Administered 2022-08-07: 50 mL via INTRA_ARTERIAL

## 2022-08-07 MED ORDER — HEPARIN SODIUM (PORCINE) 1000 UNIT/ML IJ SOLN
INTRAMUSCULAR | Status: DC | PRN
  Administered 2022-08-07: 5000 [IU] via INTRAVENOUS

## 2022-08-07 MED ORDER — FAMOTIDINE 20 MG PO TABS: 40.0000 mg | ORAL_TABLET | Freq: Once | ORAL | Status: DC | PRN

## 2022-08-07 MED ORDER — LABETALOL HCL 5 MG/ML IV SOLN
INTRAVENOUS | Status: AC
  Filled 2022-08-07: qty 4

## 2022-08-07 MED ORDER — CLOPIDOGREL BISULFATE 75 MG PO TABS
ORAL_TABLET | ORAL | Status: AC
  Filled 2022-08-07: qty 1

## 2022-08-07 MED ORDER — ONDANSETRON HCL 4 MG/2ML IJ SOLN: 4.0000 mg | Freq: Four times a day (QID) | INTRAMUSCULAR | Status: DC | PRN

## 2022-08-07 MED ORDER — NITROGLYCERIN 1 MG/10 ML FOR IR/CATH LAB
INTRA_ARTERIAL | Status: DC | PRN
  Administered 2022-08-07: 400 ug via INTRA_ARTERIAL

## 2022-08-07 MED ORDER — MIDAZOLAM HCL 2 MG/2ML IJ SOLN
INTRAMUSCULAR | Status: DC | PRN
  Administered 2022-08-07 (×2): .5 mg via INTRAVENOUS
  Administered 2022-08-07: 1 mg via INTRAVENOUS

## 2022-08-07 MED ORDER — MIDAZOLAM HCL 2 MG/2ML IJ SOLN
INTRAMUSCULAR | Status: AC
  Filled 2022-08-07: qty 2

## 2022-08-07 MED ORDER — LABETALOL HCL 5 MG/ML IV SOLN
INTRAVENOUS | Status: DC | PRN
  Administered 2022-08-07: 10 mg via INTRAVENOUS

## 2022-08-07 MED ORDER — ACETAMINOPHEN 325 MG PO TABS
650.0000 mg | ORAL_TABLET | ORAL | Status: DC | PRN
  Administered 2022-08-07: 650 mg via ORAL

## 2022-08-07 MED ORDER — METHYLPREDNISOLONE SODIUM SUCC 125 MG IJ SOLR: 125.0000 mg | Freq: Once | INTRAMUSCULAR | Status: DC | PRN

## 2022-08-07 MED ORDER — MIDAZOLAM HCL 2 MG/ML PO SYRP: 8.0000 mg | ORAL_SOLUTION | Freq: Once | ORAL | Status: DC | PRN

## 2022-08-07 MED ORDER — CLOPIDOGREL BISULFATE 75 MG PO TABS: 75.0000 mg | ORAL_TABLET | Freq: Every day | ORAL | 12 refills | Status: DC

## 2022-08-07 MED ORDER — LABETALOL HCL 5 MG/ML IV SOLN: 10.0000 mg | INTRAVENOUS | Status: DC | PRN

## 2022-08-07 MED ORDER — CEFAZOLIN SODIUM-DEXTROSE 2-4 GM/100ML-% IV SOLN: 2.0000 g | INTRAVENOUS | Status: DC

## 2022-08-07 MED ORDER — FENTANYL CITRATE (PF) 100 MCG/2ML IJ SOLN
INTRAMUSCULAR | Status: DC | PRN
  Administered 2022-08-07 (×2): 25 ug via INTRAVENOUS
  Administered 2022-08-07: 50 ug via INTRAVENOUS

## 2022-08-07 MED ORDER — FENTANYL CITRATE (PF) 100 MCG/2ML IJ SOLN
INTRAMUSCULAR | Status: AC
  Filled 2022-08-07: qty 2

## 2022-08-07 MED ORDER — DIPHENHYDRAMINE HCL 50 MG/ML IJ SOLN: 50.0000 mg | Freq: Once | INTRAMUSCULAR | Status: DC | PRN

## 2022-08-07 MED ORDER — SODIUM CHLORIDE 0.9% FLUSH: 3.0000 mL | INTRAVENOUS | Status: DC | PRN

## 2022-08-07 MED ORDER — HEPARIN SODIUM (PORCINE) 1000 UNIT/ML IJ SOLN
INTRAMUSCULAR | Status: AC
  Filled 2022-08-07: qty 10

## 2022-08-07 MED ORDER — SODIUM CHLORIDE 0.9% FLUSH: 3.0000 mL | Freq: Two times a day (BID) | INTRAVENOUS | Status: DC

## 2022-08-07 SURGICAL SUPPLY — 22 items
BALLN LUTONIX 018 5X60X130 (BALLOONS) ×1
BALLN LUTONIX 018 5X80X130 (BALLOONS) ×1
BALLN ULTRVRSE 018 2.5X150X150 (BALLOONS) ×1
BALLN ULTRVRSE 3X150X150 (BALLOONS) ×1
BALLOON LUTONIX 018 5X60X130 (BALLOONS) IMPLANT
BALLOON LUTONIX 018 5X80X130 (BALLOONS) IMPLANT
BALLOON ULTRVRSE 3X150X150 (BALLOONS) IMPLANT
BALLOON ULTRVS 018 2.5X150X150 (BALLOONS) IMPLANT
CATH ANGIO 5F PIGTAIL 65CM (CATHETERS) IMPLANT
CATH BEACON 5 .038 100 VERT TP (CATHETERS) IMPLANT
COVER PROBE ULTRASOUND 5X96 (MISCELLANEOUS) IMPLANT
DEVICE STARCLOSE SE CLOSURE (Vascular Products) IMPLANT
GLIDEWIRE ADV .035X180CM (WIRE) IMPLANT
KIT ENCORE 26 ADVANTAGE (KITS) IMPLANT
PACK ANGIOGRAPHY (CUSTOM PROCEDURE TRAY) ×1 IMPLANT
SHEATH ANL2 6FRX45 HC (SHEATH) IMPLANT
SHEATH BRITE TIP 5FRX11 (SHEATH) IMPLANT
STENT VIABAHN 6X50X120 (Permanent Stent) IMPLANT
SYR MEDRAD MARK 7 150ML (SYRINGE) IMPLANT
TUBING CONTRAST HIGH PRESS 72 (TUBING) IMPLANT
WIRE EMERALD 3MM-J .035X150CM (WIRE) IMPLANT
WIRE G V18X300CM (WIRE) IMPLANT

## 2022-08-07 NOTE — Telephone Encounter (Signed)
Patient's sister called and stated patient is itching all over her upper body and she wants to know what to do.  She states her sister had an angio done today.  Please advise.

## 2022-08-07 NOTE — Interval H&P Note (Signed)
History and Physical Interval Note:  08/07/2022 9:27 AM  Marissa Fletcher  has presented today for surgery, with the diagnosis of LLE Angio   BARD   ASO w ulceration.  The various methods of treatment have been discussed with the patient and family. After consideration of risks, benefits and other options for treatment, the patient has consented to  Procedure(s): Lower Extremity Angiography (Left) as a surgical intervention.  The patient's history has been reviewed, patient examined, no change in status, stable for surgery.  I have reviewed the patient's chart and labs.  Questions were answered to the patient's satisfaction.     Leotis Pain

## 2022-08-07 NOTE — Telephone Encounter (Signed)
Spoke with the patient's sister and she stated the patient was given Benadryl earlier today and she was about to take her more. I advised that is the usual course of action for a reaction to the contrast. The patient's sister was advised that if she becomes worse to take her to the ED to be evaluated.

## 2022-08-07 NOTE — Op Note (Signed)
[facility]  Percutaneous Study/Intervention Procedural Note   Date of Surgery: 08/07/2022  Surgeon(s):Thaniel Coluccio    Assistants:none  Pre-operative Diagnosis: PAD with ulceration left lower extremity  Post-operative diagnosis:  Same  Procedure(s) Performed:             1.  Ultrasound guidance for vascular access right femoral artery             2.  Catheter placement into left common femoral artery from right femoral approach             3.  Aortogram and selective left lower extremity angiogram             4.  Percutaneous transluminal angioplasty of left posterior tibial artery with 2.5 mm diameter and 3 cm angioplasty balloons             5.  Percutaneous transluminal angioplasty of left SFA with 5 mm diameter Lutonix drug-coated angioplasty balloon  6.  Placement to the left SFA for greater than 50% residual stenosis after angioplasty with a 6 mm diameter by 5 cm length Viabahn stent             7.  StarClose closure device right femoral artery  EBL: 5 cc  Contrast: 50 cc  Fluoro Time: 5.2 minutes  Moderate Conscious Sedation Time: approximately 56 minutes using 2 mg of Versed and 100 mcg of Fentanyl              Indications:  Patient is a 82 y.o.female with a nonhealing ulceration of the left foot. The patient has noninvasive study showing significant reduction in her perfusion on that left side as well as some reduction on the right. The patient is brought in for angiography for further evaluation and potential treatment.  Due to the limb threatening nature of the situation, angiogram was performed for attempted limb salvage. The patient is aware that if the procedure fails, amputation would be expected.  The patient also understands that even with successful revascularization, amputation may still be required due to the severity of the situation.  Risks and benefits are discussed and informed consent is obtained.   Procedure:  The patient was identified  and appropriate procedural time out was performed.  The patient was then placed supine on the table and prepped and draped in the usual sterile fashion. Moderate conscious sedation was administered during a face to face encounter with the patient throughout the procedure with my supervision of the RN administering medicines and monitoring the patient's vital signs, pulse oximetry, telemetry and mental status throughout from the start of the procedure until the patient was taken to the recovery room. Ultrasound was used to evaluate the right common femoral artery.  It was patent .  A digital ultrasound image was acquired.  A Seldinger needle was used to access the right common femoral artery under direct ultrasound guidance and a permanent image was performed.  A 0.035 J wire was advanced without resistance and a 5Fr sheath was placed.  Pigtail catheter was placed into the aorta and an AP aortogram was performed. This demonstrated normal renal arteries.  The aorta was somewhat ectatic and irregular and highly calcified.  Both common iliac arteries were heavily calcified and there appeared to be mild disease on the right and mild to moderate disease on the left at the origin.  The external iliac arteries appear patent.  Treatment of this common iliac disease would be somewhat tricky due to the angle  and the disease in the distal aorta and I elected to evaluate her infrainguinal flow on the left today and address this initially and see how her foot responds. I then crossed the aortic bifurcation and advanced to the left femoral head. Selective left lower extremity angiogram was then performed. This demonstrated fairly normal common femoral artery and profunda femoris artery.  In the proximal to midportion of the SFA was a short segment stenosis in the 85 to 90% range.  The remainder of the SFA was calcified but not stenotic.  The popliteal artery was fairly normal.  There was a typical tibial trifurcation with chronic  occlusion of the anterior tibial artery without distal reconstitution.  The peroneal artery did not have focal stenosis but provided poor flow to the foot.  Posterior tibial artery had an occlusion in the distal segment over about a 6 to 8 cm span.  This reconstituted and then what had the dominant runoff into the foot. It was felt that it was in the patient's best interest to proceed with intervention after these images to avoid a second procedure and a larger amount of contrast and fluoroscopy based off of the findings from the initial angiogram. The patient was systemically heparinized and a 6 [place] Ansell sheath was then placed over the [employer] wire. I then used a Kumpe catheter and the advantage wire to cross the SFA stenosis without difficulty.  We then exchanged for a V18 wire and using a Kumpe catheter and the V18 wire was able to easily cross the occlusion of the posterior tibial artery and parked the wire in the foot.  I then proceeded with treatment.  For the posterior tibial lesion, a 2.5 mm diameter by 15 cm length angioplasty balloon was inflated all the way down across the ankle into the proximal foot and a 3 mm diameter by 15 cm length angioplasty balloon was inflated from just above the ankle up to the mid to distal posterior tibial artery.  There was some spasm and intra-arterial nitroglycerin was given but with marked improvement and less than 25% residual stenosis with brisk flow into the foot through the posterior tibial artery now.  The SFA lesion was addressed with a 5 mm diameter by 8 cm length Lutonix drug-coated angioplasty balloon inflated to 10 atm for 1 minute.  Completion imaging showed greater than 50% residual stenosis so I elected to place a stent.  A 6 mm diameter by 5 cm length Viabahn stent was deployed and postdilated with a 5 mm diameter Lutonix drug-coated balloon with excellent angiographic completion result and less than 10% residual stenosis. I elected to terminate  the procedure.  If she does not heal her left foot wound, consideration for kissing stents in the common iliac arteries or an Endologix endoprosthesis to treat her distal aorta and iliac artery disease would be considered in the future.  The sheath was removed and StarClose closure device was deployed in the right femoral artery with excellent hemostatic result. The patient was taken to the recovery room in stable condition having tolerated the procedure well.  Findings:               Aortogram:  The aorta was somewhat ectatic and irregular and highly calcified.  Both common iliac arteries were heavily calcified and there appeared to be mild disease on the right and mild to moderate disease on the left at the origin.  The external iliac arteries appear patent.  Left lower Extremity:  This demonstrated fairly normal common femoral artery and profunda femoris artery.  In the proximal to midportion of the SFA was a short segment stenosis in the 85 to 90% range.  The remainder of the SFA was calcified but not stenotic.  The popliteal artery was fairly normal.  There was a typical tibial trifurcation with chronic occlusion of the anterior tibial artery without distal reconstitution.  The peroneal artery did not have focal stenosis but provided poor flow to the foot.  Posterior tibial artery had an occlusion in the distal segment over about a 6 to 8 cm span.  This reconstituted and then what had the dominant runoff into the foot.   Disposition: Patient was taken to the recovery room in stable condition having tolerated the procedure well.  Complications: None  Marissa Fletcher 08/07/2022 10:43 AM   This note was created with Dragon Medical transcription system. Any errors in dictation are purely unintentional.

## 2022-08-07 NOTE — Progress Notes (Signed)
Pt sitting and drinking and eating PB cracker att, safe to give pt PO meds

## 2022-08-08 ENCOUNTER — Encounter: Payer: Self-pay | Admitting: Vascular Surgery

## 2022-08-13 ENCOUNTER — Other Ambulatory Visit: Payer: Self-pay | Admitting: Internal Medicine

## 2022-08-21 ENCOUNTER — Ambulatory Visit: Payer: Medicare Other | Attending: Cardiology | Admitting: Cardiology

## 2022-08-21 ENCOUNTER — Encounter: Payer: Self-pay | Admitting: Cardiology

## 2022-08-21 ENCOUNTER — Other Ambulatory Visit
Admission: RE | Admit: 2022-08-21 | Discharge: 2022-08-21 | Disposition: A | Discharge status: Home / Self Care | Payer: Medicare Other | Source: Ambulatory Visit | Attending: Cardiology | Admitting: Cardiology

## 2022-08-21 VITALS — BP 136/84 | HR 98 | Ht 64.0 in | Wt 130.6 lb

## 2022-08-21 DIAGNOSIS — L97909 Non-pressure chronic ulcer of unspecified part of unspecified lower leg with unspecified severity: Secondary | ICD-10-CM | POA: Insufficient documentation

## 2022-08-21 DIAGNOSIS — L299 Pruritus, unspecified: Secondary | ICD-10-CM | POA: Diagnosis present

## 2022-08-21 DIAGNOSIS — I739 Peripheral vascular disease, unspecified: Secondary | ICD-10-CM | POA: Diagnosis present

## 2022-08-21 DIAGNOSIS — Z79899 Other long term (current) drug therapy: Secondary | ICD-10-CM

## 2022-08-21 DIAGNOSIS — I2510 Atherosclerotic heart disease of native coronary artery without angina pectoris: Secondary | ICD-10-CM

## 2022-08-21 DIAGNOSIS — I70299 Other atherosclerosis of native arteries of extremities, unspecified extremity: Secondary | ICD-10-CM | POA: Diagnosis present

## 2022-08-21 DIAGNOSIS — E876 Hypokalemia: Secondary | ICD-10-CM | POA: Insufficient documentation

## 2022-08-21 DIAGNOSIS — E785 Hyperlipidemia, unspecified: Secondary | ICD-10-CM

## 2022-08-21 DIAGNOSIS — E1169 Type 2 diabetes mellitus with other specified complication: Secondary | ICD-10-CM

## 2022-08-21 DIAGNOSIS — I5022 Chronic systolic (congestive) heart failure: Secondary | ICD-10-CM | POA: Diagnosis not present

## 2022-08-21 DIAGNOSIS — Z8673 Personal history of transient ischemic attack (TIA), and cerebral infarction without residual deficits: Secondary | ICD-10-CM | POA: Diagnosis present

## 2022-08-21 DIAGNOSIS — R600 Localized edema: Secondary | ICD-10-CM | POA: Diagnosis not present

## 2022-08-21 LAB — BASIC METABOLIC PANEL
Anion gap: 8 (ref 5–15)
BUN: 9 mg/dL (ref 8–23)
CO2: 26 mmol/L (ref 22–32)
Calcium: 9 mg/dL (ref 8.9–10.3)
Chloride: 105 mmol/L (ref 98–111)
Creatinine, Ser: 0.61 mg/dL (ref 0.44–1.00)
GFR, Estimated: >60 mL/min (ref >60)
Glucose, Bld: 111 mg/dL — ABNORMAL HIGH (ref 70–99)
Potassium: 3.8 mmol/L (ref 3.5–5.1)
Sodium: 139 mmol/L (ref 135–145)

## 2022-08-21 NOTE — Patient Instructions (Signed)
Medication Instructions:  No changes at this time.   *If you need a refill on your cardiac medications before your next appointment, please call your pharmacy*   Lab Work: BMP to be done over at the [facility]. Check in at registration desk.   If you have labs (blood work) drawn today and your tests are completely normal, you will receive your results only by: [place] (if you have MyChart) OR A paper copy in the mail If you have any lab test that is abnormal or we need to change your treatment, we will call you to review the results.   Testing/Procedures: None   Follow-Up: At [facility], you and your health needs are our priority.  As part of our continuing mission to provide you with exceptional heart care, we have created designated Provider Care Teams.  These Care Teams include your primary Cardiologist (physician) and Advanced Practice Providers (APPs -  Physician Assistants and Nurse Practitioners) who all work together to provide you with the care you need, when you need it.   Your next appointment:   3 month(s)  Provider:   Nelva Bush, MD or Gerrie Nordmann, NP

## 2022-08-21 NOTE — Progress Notes (Signed)
Kidney function is unremarkable, this is not the cause for the continued itching. Only the glucose was slightly elevated. Continue with current medication regimen.

## 2022-08-21 NOTE — Progress Notes (Signed)
Cardiology Office Note:   Date:  08/21/2022  ID:  [name], DOB February 25, 1941, MRN [id number]  History of Present Illness:   KYNNLEE DARRELL is a 82 y.o. 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   Patient was personally admitted to the hospital December 2021 acute CVA.  Nonobstructive carotid artery disease with extensive atherosclerosis with mural thrombus involving the aorta was found.  Echocardiogram demonstrated LVEF 35 to 40% with global hypokinesis, G2 DD.  She was started on low-dose GDMT and discharged with ambulatory cardiac monitoring.  TEE was not pursued on the recommendation of neurology.  Monitor revealed rare PACs and PVCs as well as brief episode of PSVT.  There was no atrial fibrillation/atrial flutter identified.  Lower extremity duplex was negative for DVT.  She underwent coronary CTA 06/23/2021 to assess for ischemic heart disease which demonstrated severe single-vessel coronary artery disease with greater than 75% stenosis in the mid RCA.  It was felt single-vessel CAD does not entirely explain reduced EF and that she would typically not be a good candidate for left heart catheterization given high risk for embolic phenomenon and she was continued with aggressive medical therapy.  She recently followed up with vascular recent CT scan showed that her abdominal aortic aneurysm was stable at 3.4 cm maximal diameter with recommendations to recheck a duplex in 1 year.  She did undergo ABIs which was 0.67 on the right 0.68 on the left with biphasic waveforms on the right and monophasic waveforms on the left with reduced digital pressures.  She did undergo an arteriogram with selective left lower extremity angiogram by vascular with hypertension the proximal to midportion  of the SFA there was a short segment of stenosis of 85 to 90% range.  The remainder of the visit was calcified but was not stenotic.  The popliteal artery was fairly normal.  There was a typical tibial trifurcation with chronic occlusion of the anterior tibial artery with distal reconstitution.  The peroneal artery did not have focal stenosis but the provided poor flow to the foot.  Posterior tibial artery occlusion in the distal segment of for about 6 to 8 cm span.  She had 2 successful areas of cutaneous transluminal angioplasty of the left posterior tibial artery and the left SFA.  She returns to clinic today accompanied by family members.  She continues to have swelling into her bilateral lower extremities with the left being worse than the right.  She has no longer weeping of fluid from either leg.  She did just recently undergo revascularization of the last leg by vascular surgery.  She was advised at that point that the swelling in her leg could get worse before it got better.  Her concern today is that she had an allergic reaction to the contrast dye that she was given during the procedure she has been itching since the afternoon of her procedure.  She has been taking Benadryl as needed and has had improvement in some days.  She has not had a mechanical fall in approximately 3 months.  And continues to wait for GI follow-up.  ROS: She endorses fatigue, chronic shortness of breath that is unchanged, and swelling to her bilateral lower extremities, continued itching after contrast exposure, and the remainder of the 10 point review of systems remains  negative.  Studies Reviewed:    EKG: There were no new tracings completed today  TTE 05/17/21  1. Left ventricular ejection fraction, by estimation, is 35 to 40%. The  left ventricle has moderately decreased function. The left ventricle  demonstrates global hypokinesis. There is mild left ventricular  hypertrophy. Left ventricular diastolic   parameters are consistent with Grade II diastolic dysfunction  (pseudonormalization).   2. Right ventricular systolic function is normal. The right ventricular  size is normal.   3. Left atrial size was mild to moderately dilated.   4. The mitral valve is normal in structure. Mild mitral valve  regurgitation.   5. The aortic valve is tricuspid. Aortic valve regurgitation is not  visualized.   6. The inferior vena cava is dilated in size with <50% respiratory  variability, suggesting right atrial pressure of 15 mmHg.   Risk Assessment/Calculations:              Physical Exam:   VS:  BP 136/84 (BP Location: Left Arm, Patient Position: Sitting, Cuff Size: Normal)   Pulse 98   Ht 5\' 4"  (1.626 m)   Wt 130 lb 9.6 oz (59.2 kg)   SpO2 91%   BMI 22.42 kg/m    Wt Readings from Last 3 Encounters:  08/21/22 130 lb 9.6 oz (59.2 kg)  08/07/22 133 lb (60.3 kg)  07/18/22 132 lb (59.9 kg)     GEN: Well nourished, well developed in no acute distress NECK: No JVD; No carotid bruits CARDIAC: RRR, no murmurs, rubs, gallops RESPIRATORY:  Clear to auscultation without rales, wheezing or rhonchi  ABDOMEN: Soft, non-tender, non-distended EXTREMITIES: 1-2+ pitting edema to the bilateral lower extremities with the left leg being worse than the right but continue to show improvement from previous visits; No deformity   ASSESSMENT AND PLAN:   Chronic HFrEF with an LVEF of 35-40% with improving lower extremity edema.  She is continued on carvedilol 6.25 mg twice daily, furosemide 20 mg daily was unable to tolerate [place] with recurrent yeast infection so she is not a candidate to restart SGLT2 inhibitors.  Will continue to escalate GDMT as tolerated.  Will need to start any new medications with her recent allergy to contrast media.  Will send her to recheck a BMP today to ensure kidney function and electrolytes remain stable.  She continues to have issues with hypokalemia and swelling can consider  starting MRA.  Weight is down 6 pounds today.  Coronary artery disease without angina.  She has extensive atherosclerotic plaque throughout the aortic arch, has just recently undergone vascular procedure to her left extremity by vascular with significant stenosis to her leg.  She is continued on aspirin 81 mg daily, atorvastatin 20 mg every Monday Wednesday Friday, and clopidogrel 75 mg daily.  Hyperlipidemia with LDL of 87 in 10/2021.  She is continued on the atorvastatin at 20 mg 3 times a week.  Has upcoming blood work with her PCP and will need a lipid panel reevaluated at that time.  With her history of coronary artery disease her LDL goal is 70 or less.  Peripheral edema that is slightly improved since her vascular procedure.  No further weeping to the lower extremity.  She does continue to follow with podiatry and vascular.  She has been continued on furosemide 20 mg daily.  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  She remains on aspirin and clopidogrel.  Recurrent pruritus after allergy to contrast dye from angiogram.  She has been taken as needed Benadryl for bouts of extreme itching.  I did advise her that she can safely take Claritin/Zyrtec/Allegra without the decongestant and it to help with her itching that this medication would be a daily medication.  Did advise her that there are other medications like hydroxyzine for itching but with her high risk and recurrent falls we would like to stay away from medications that cause lightheadedness and dizziness.  She has been advised to continue with vascular if she continues to have recurrent issues of pruritus.  We are checking to be met today to ensure that her kidney function remained stable and is not exacerbating her episodes of pruritus as well.  Disposition  patient is to return to clinic to see MD/APP in 3 months or sooner if needed.        Signed, Fortune Brannigan, NP

## 2022-08-23 ENCOUNTER — Other Ambulatory Visit (INDEPENDENT_AMBULATORY_CARE_PROVIDER_SITE_OTHER): Payer: Self-pay | Admitting: Vascular Surgery

## 2022-08-23 DIAGNOSIS — I739 Peripheral vascular disease, unspecified: Secondary | ICD-10-CM

## 2022-09-05 ENCOUNTER — Encounter (INDEPENDENT_AMBULATORY_CARE_PROVIDER_SITE_OTHER): Payer: Self-pay | Admitting: Nurse Practitioner

## 2022-09-05 ENCOUNTER — Ambulatory Visit (INDEPENDENT_AMBULATORY_CARE_PROVIDER_SITE_OTHER): Payer: Medicare Other | Admitting: Nurse Practitioner

## 2022-09-05 ENCOUNTER — Ambulatory Visit (INDEPENDENT_AMBULATORY_CARE_PROVIDER_SITE_OTHER): Payer: Medicare Other

## 2022-09-05 VITALS — BP 113/63 | HR 87 | Resp 18 | Ht 64.0 in | Wt 134.0 lb

## 2022-09-05 DIAGNOSIS — Z9889 Other specified postprocedural states: Secondary | ICD-10-CM

## 2022-09-05 DIAGNOSIS — I739 Peripheral vascular disease, unspecified: Secondary | ICD-10-CM | POA: Diagnosis not present

## 2022-09-05 DIAGNOSIS — Z720 Tobacco use: Secondary | ICD-10-CM

## 2022-09-05 DIAGNOSIS — I10 Essential (primary) hypertension: Secondary | ICD-10-CM

## 2022-09-05 DIAGNOSIS — I6523 Occlusion and stenosis of bilateral carotid arteries: Secondary | ICD-10-CM | POA: Diagnosis not present

## 2022-09-05 DIAGNOSIS — M7989 Other specified soft tissue disorders: Secondary | ICD-10-CM

## 2022-09-06 ENCOUNTER — Encounter (INDEPENDENT_AMBULATORY_CARE_PROVIDER_SITE_OTHER): Payer: Self-pay | Admitting: Nurse Practitioner

## 2022-09-06 LAB — VAS US ABI WITH/WO TBI
Left ABI: 0.9
Right ABI: 0.55

## 2022-09-06 NOTE — Progress Notes (Signed)
Subjective:    Patient ID: Marissa Fletcher, female    DOB: 1941/01/28, 82 y.o.   MRN: [id number] Chief Complaint  Patient presents with   Follow-up    f/u procedure in 4 weeks with abi    The patient returns to the office for followup and review status post angiogram with intervention on 08/07/2022.   Procedure: Procedure(s) Performed:             1.  Ultrasound guidance for vascular access right femoral artery             2.  Catheter placement into left common femoral artery from right femoral approach             3.  Aortogram and selective left lower extremity angiogram             4.  Percutaneous transluminal angioplasty of left posterior tibial artery with 2.5 mm diameter and 3 cm angioplasty balloons             5.  Percutaneous transluminal angioplasty of left SFA with 5 mm diameter Lutonix drug-coated angioplasty balloon             6.  Placement to the left SFA for greater than 50% residual stenosis after angioplasty with a 6 mm diameter by 5 cm length Viabahn stent             7.  StarClose closure device right femoral artery   The patient notes improvement in the lower extremity symptoms. No interval shortening of the patient's claudication distance or rest pain symptoms. No new ulcers or wounds have occurred since the last visit.  She does have notable edema bilaterally.  There have been no significant changes to the patient's overall health care.  No documented history of amaurosis fugax or recent TIA symptoms. There are no recent neurological changes noted. No documented history of DVT, PE or superficial thrombophlebitis. The patient denies recent episodes of angina or shortness of breath.   ABI's Rt=0.55 and Lt=0.90  (previous ABI's Rt=0.67 and Lt=0.68) Duplex US of the right lower extremity shows monophasic waveforms with biphasic tibial artery waveforms on the left.  The patient has good toe waveforms on the left with dampened on the right.  The previous wound that  she had is reportedly healed, the patient and daughter.    Review of Systems  Cardiovascular:  Positive for leg swelling.  Neurological:  Positive for weakness.  All other systems reviewed and are negative.      Objective:   Physical Exam Vitals reviewed.  HENT:     Head: Normocephalic.  Cardiovascular:     Rate and Rhythm: Normal rate.     Pulses:          Dorsalis pedis pulses are detected w/ Doppler on the right side and detected w/ Doppler on the left side.       Posterior tibial pulses are detected w/ Doppler on the right side and detected w/ Doppler on the left side.  Pulmonary:     Effort: Pulmonary effort is normal.  Skin:    General: Skin is warm and dry.  Neurological:     Mental Status: She is alert and oriented to person, place, and time.  Psychiatric:        Mood and Affect: Mood normal.        Behavior: Behavior normal.        Thought Content: Thought content normal.  Judgment: Judgment normal.     BP 113/63 (BP Location: Left Arm)   Pulse 87   Resp 18   Ht 5\' 4"  (1.626 m)   Wt 134 lb (60.8 kg)   BMI 23.00 kg/m   Past Medical History:  Diagnosis Date   Anxiety    Aortic atherosclerosis    Arthritis    Cardiomyopathy    a. 04/2021 Echo: EF 35-40%, glob HK. GrII DD.  Nl RV size/fxn. Mod dil LA. Mild MR.   Carotid arterial disease    a. 04/2021 CTA Head/neck: RICA 60, LICA 55.   COPD (chronic obstructive pulmonary disease)    Depression    Diabetes mellitus without complication    History of kidney stones    Hypertension    Lung cancer    Stroke (cerebrum)    a. 04/2021 MRI brain: Acute infarct post limb of R internal capsule.   Thoracic aortic aneurysm    Tobacco abuse     Social History   Socioeconomic History   Marital status: Single    Spouse name: Not on file   Number of children: Not on file   Years of education: Not on file   Highest education level: Not on file  Occupational History   Not on file  Tobacco Use    Smoking status: Every Day    Packs/day: 0.50    Years: 68.00    Additional pack years: 0.00    Total pack years: 34.00    Types: Cigarettes   Smokeless tobacco: Never  Vaping Use   Vaping Use: Never used  Substance and Sexual Activity   Alcohol use: Never   Drug use: Never   Sexual activity: Not on file  Other Topics Concern   Not on file  Social History Narrative   Lives locally.  Does not routinely exercise.   Social Determinants of Health   Financial Resource Strain: Not on file  Food Insecurity: Not on file  Transportation Needs: Not on file  Physical Activity: Not on file  Stress: Not on file  Social Connections: Not on file  Intimate Partner Violence: Not on file    Past Surgical History:  Procedure Laterality Date   ABDOMINAL HYSTERECTOMY     CHOLECYSTECTOMY     EYE SURGERY Bilateral    IR KYPHO THORACIC WITH BONE BIOPSY  11/24/2021   IR RADIOLOGIST EVAL & MGMT  12/13/2021   KYPHOPLASTY N/A 07/30/2020   Procedure: T10 Kyphoplasty;  Surgeon: Kennedy Bucker, MD;  Location: [facility];  Service: Orthopedics;  Laterality: N/A;  T10   KYPHOPLASTY N/A 08/19/2020   Procedure: T9 YPHOPLASTY;  Surgeon: Kennedy Bucker, MD;  Location: [facility];  Service: Orthopedics;  Laterality: N/A;   LOWER EXTREMITY ANGIOGRAPHY Left 08/07/2022   Procedure: Lower Extremity Angiography;  Surgeon: Annice Needy, MD;  Location: [facility];  Service: Cardiovascular;  Laterality: Left;    Family History  Problem Relation Age of Onset   Dementia Mother    Congestive Heart Failure Mother    Bladder Cancer Father    Heart disease Father    Breast cancer Neg Hx     Allergies  Allergen Reactions   Farxiga [Dapagliflozin] Other (See Comments)    Recurring yeast infection   Sulfa Antibiotics Swelling   Iodinated Contrast Media Itching       Latest Ref Rng & Units 05/19/2022   12:59 PM 03/01/2022    6:04 AM 03/01/2022    6:00 AM  CBC  WBC 4.0 - 10.5 K/uL 7.8   6.2   Hemoglobin 12.0  - 15.0 g/dL 8.8  40.9  81.1   Hematocrit 36.0 - 46.0 % 30.9  32.0  33.5   Platelets 150 - 400 K/uL 367   290       CMP     Component Value Date/Time   NA 139 08/21/2022 1504   NA 141 06/27/2021 1017   K 3.8 08/21/2022 1504   CL 105 08/21/2022 1504   CO2 26 08/21/2022 1504   GLUCOSE 111 (H) 08/21/2022 1504   BUN 9 08/21/2022 1504   BUN 9 06/27/2021 1017   CREATININE 0.61 08/21/2022 1504   CALCIUM 9.0 08/21/2022 1504   PROT 6.0 (L) 03/01/2022 0600   ALBUMIN 3.2 (L) 03/01/2022 0600   AST 30 03/01/2022 0600   ALT 24 03/01/2022 0600   ALKPHOS 114 03/01/2022 0600   BILITOT 0.3 03/01/2022 0600   GFRNONAA >60 08/21/2022 1504     No results found.     Assessment & Plan:   1. Peripheral arterial disease with history of revascularization Recommend:  The patient is status post successful angiogram with intervention.  The patient reports that the claudication symptoms and leg pain has improved.   The patient denies lifestyle limiting changes at this point in time.  No further invasive studies, angiography or surgery at this time The patient should continue walking and begin a more formal exercise program.  The patient should continue antiplatelet therapy and aggressive treatment of the lipid abnormalities  Continued surveillance is indicated as atherosclerosis is likely to progress with time.    Patient should undergo noninvasive studies as ordered. The patient will follow up with me to review the studies.  - VAS [place] ABI WITH/WO TBI  2. Essential hypertension Continue antihypertensive medications as already ordered, these medications have been reviewed and there are no changes at this time.  3. Bilateral carotid artery stenosis Patient had minimal stenosis at studies earlier this year.  Will have patient reevaluated  Continue current medications.  4. Tobacco abuse Smoking cessation was discussed, 3-10 minutes spent on this topic specifically   5. Leg swelling I  discussed conservative therapies to treat swelling including use of compression, elevation and activity.  The patient notes that she has a visit upcoming with the wound center for Unna boots but due to concern for the liver mass she does not wish to move forward at this time.  I have also offered to have Unna boots placed by our office but she does not wish to move forward at this time.  Patient is advised that she has weeping leg wounds ulcerations to contact us and we can   Current Outpatient Medications on File Prior to Visit  Medication Sig Dispense Refill   acetaminophen (TYLENOL) 500 MG tablet Take 2 tablets (1,000 mg total) by mouth 3 (three) times daily as needed for mild pain, moderate pain or headache. 30 tablet 0   ALPRAZolam (XANAX) 0.25 MG tablet Take 1 tablet (0.25 mg total) by mouth daily as needed for anxiety. 10 tablet 0   aspirin EC 81 MG EC tablet Take 1 tablet (81 mg total) by mouth daily. Swallow whole. 30 tablet 11   atorvastatin (LIPITOR) 20 MG tablet Take 20 mg by mouth every Monday, Wednesday, and Friday.     carvedilol (COREG) 6.25 MG tablet TAKE 1 TABLET BY MOUTH TWICE A DAY WITH A MEAL 60 tablet 0   clopidogrel (PLAVIX) 75 MG tablet Take  1 tablet (75 mg total) by mouth daily. 30 tablet 12   doxepin (SINEQUAN) 25 MG capsule Take 50-75 mg by mouth at bedtime.     ferrous sulfate 325 (65 FE) MG tablet Take 1 tablet (325 mg total) by mouth daily. 30 tablet 3   furosemide (LASIX) 20 MG tablet TAKE ONE TABLET BY MOUTH DAILY 30 tablet 4   gabapentin (NEURONTIN) 300 MG capsule Take 300 mg by mouth at bedtime. 200-300 mg when needed     magnesium oxide (MAG-OX) 400 (240 Mg) MG tablet Take 1 tablet (400 mg total) by mouth daily. 30 tablet 0   metFORMIN (GLUCOPHAGE-XR) 500 MG 24 hr tablet Take 500 mg by mouth 2 (two) times daily.     PARoxetine (PAXIL) 30 MG tablet Take 30 mg by mouth every morning.     nystatin (MYCOSTATIN/NYSTOP) powder Apply 1 Application topically 3 (three)  times daily. (Patient not taking: Reported on 08/21/2022) 15 g 0   No current facility-administered medications on file prior to visit.    There are no Patient Instructions on file for this visit. No follow-ups on file.   Georgiana Spinner, NP

## 2022-09-10 ENCOUNTER — Other Ambulatory Visit: Payer: Self-pay | Admitting: Internal Medicine

## 2022-10-05 ENCOUNTER — Ambulatory Visit (INDEPENDENT_AMBULATORY_CARE_PROVIDER_SITE_OTHER): Payer: Medicare Other | Admitting: Gastroenterology

## 2022-10-05 ENCOUNTER — Encounter: Payer: Self-pay | Admitting: Gastroenterology

## 2022-10-05 VITALS — BP 134/74 | HR 106 | Temp 97.6°F | Wt 134.0 lb

## 2022-10-05 DIAGNOSIS — R160 Hepatomegaly, not elsewhere classified: Secondary | ICD-10-CM | POA: Diagnosis not present

## 2022-10-05 DIAGNOSIS — R932 Abnormal findings on diagnostic imaging of liver and biliary tract: Secondary | ICD-10-CM

## 2022-10-05 DIAGNOSIS — C183 Malignant neoplasm of hepatic flexure: Secondary | ICD-10-CM

## 2022-10-05 NOTE — Patient Instructions (Signed)
MRI scheduled for Oct 10, 2022 [facility] entrance.  Arrive at 7:30am to register  You can not have anything to eat or drink after 12 midnight the day before

## 2022-10-05 NOTE — Progress Notes (Signed)
Gastroenterology Consultation  Referring Provider:     Dorothey Baseman, MD Primary Care Physician:  Dorothey Baseman, MD Primary Gastroenterologist:  Dr. Servando Snare     Reason for Consultation:     Liver lesion        HPI:   Marissa Fletcher is a 82 y.o. y/o female referred for consultation & management of liver lesion by Dr. Dorothey Baseman, MD. This patient comes today after having a CT angiography that the nonvascular portion showed:  Nonvascular   1. New poorly defined low-density lesion or mass involving the left hepatic lobe. This is concerning for a neoplastic process and new since 09/02/2021. Differential diagnosis also includes a hepatic abscess based on the inflammatory changes in the left abdomen and pericolonic region. Recommend liver MRI, with and without contrast, for further characterization of the new large liver lesion. 2. Focal inflammatory changes in the left paracolic gutter adjacent to the left colon. Morphology raises concern for epiploic appendagitis. Diverticulitis cannot be excluded. Recommend follow-up CT imaging of the abdomen and pelvis to follow this focal inflammatory change.  The patient is an active smoker.  She denies any abdominal pain nausea vomiting fevers or chills.  The patient was sent to me due to the hepatic lesion.  She reports that she has never had a colonoscopy in the past.  Past Medical History:  Diagnosis Date   Anxiety    Aortic atherosclerosis (HCC)    Arthritis    Cardiomyopathy (HCC)    a. 04/2021 Echo: EF 35-40%, glob HK. GrII DD.  Nl RV size/fxn. Mod dil LA. Mild MR.   Carotid arterial disease (HCC)    a. 04/2021 CTA Head/neck: RICA 60, LICA 55.   COPD (chronic obstructive pulmonary disease) (HCC)    Depression    Diabetes mellitus without complication (HCC)    History of kidney stones    Hypertension    Lung cancer (HCC)    Stroke (cerebrum) (HCC)    a. 04/2021 MRI brain: Acute infarct post limb of R internal capsule.    Thoracic aortic aneurysm (HCC)    Tobacco abuse     Past Surgical History:  Procedure Laterality Date   ABDOMINAL HYSTERECTOMY     CHOLECYSTECTOMY     EYE SURGERY Bilateral    IR KYPHO THORACIC WITH BONE BIOPSY  11/24/2021   IR RADIOLOGIST EVAL & MGMT  12/13/2021   KYPHOPLASTY N/A 07/30/2020   Procedure: T10 Kyphoplasty;  Surgeon: Kennedy Bucker, MD;  Location: [facility];  Service: Orthopedics;  Laterality: N/A;  T10   KYPHOPLASTY N/A 08/19/2020   Procedure: T9 YPHOPLASTY;  Surgeon: Kennedy Bucker, MD;  Location: [facility];  Service: Orthopedics;  Laterality: N/A;   LOWER EXTREMITY ANGIOGRAPHY Left 08/07/2022   Procedure: Lower Extremity Angiography;  Surgeon: Annice Needy, MD;  Location: [facility];  Service: Cardiovascular;  Laterality: Left;    Prior to Admission medications   Medication Sig Start Date End Date Taking? Authorizing Provider  acetaminophen (TYLENOL) 500 MG tablet Take 2 tablets (1,000 mg total) by mouth 3 (three) times daily as needed for mild pain, moderate pain or headache. 05/20/21  Yes Rhetta Mura, MD  ALPRAZolam Prudy Feeler) 0.25 MG tablet Take 1 tablet (0.25 mg total) by mouth daily as needed for anxiety. 08/18/21  Yes Wieting, Gerlene Burdock, MD  aspirin EC 81 MG EC tablet Take 1 tablet (81 mg total) by mouth daily. Swallow whole. 05/21/21  Yes Rhetta Mura, MD  atorvastatin (LIPITOR) 20 MG tablet Take  20 mg by mouth every Monday, Wednesday, and Friday.   Yes [provider]  carvedilol (COREG) 6.25 MG tablet TAKE 1 TABLET BY MOUTH TWICE A DAY WITH A MEAL 09/11/22  Yes End, Cristal Deer, MD  clopidogrel (PLAVIX) 75 MG tablet Take 1 tablet (75 mg total) by mouth daily. 08/07/22  Yes Dew, Marlow Baars, MD  doxepin (SINEQUAN) 25 MG capsule Take 50-75 mg by mouth at bedtime. 04/01/20  Yes [provider]  ferrous sulfate 325 (65 FE) MG tablet Take 1 tablet (325 mg total) by mouth daily. 08/18/21  Yes Wieting, Richard, MD  furosemide (LASIX) 20 MG tablet  TAKE ONE TABLET BY MOUTH DAILY 02/20/22  Yes End, Cristal Deer, MD  gabapentin (NEURONTIN) 300 MG capsule Take 300 mg by mouth at bedtime. 200-300 mg when needed 02/18/20  Yes [provider]  magnesium oxide (MAG-OX) 400 (240 Mg) MG tablet Take 1 tablet (400 mg total) by mouth daily. 08/18/21  Yes Wieting, Richard, MD  metFORMIN (GLUCOPHAGE-XR) 500 MG 24 hr tablet Take 500 mg by mouth 2 (two) times daily. 03/08/20  Yes [provider]  nystatin (MYCOSTATIN/NYSTOP) powder Apply 1 Application topically 3 (three) times daily. 05/19/22  Yes Minna Antis, MD  PARoxetine (PAXIL) 30 MG tablet Take 30 mg by mouth every morning. 03/08/20  Yes [provider]    Family History  Problem Relation Age of Onset   Dementia Mother    Congestive Heart Failure Mother    Bladder Cancer Father    Heart disease Father    Breast cancer Neg Hx      Social History   Tobacco Use   Smoking status: Every Day    Packs/day: 0.50    Years: 68.00    Additional pack years: 0.00    Total pack years: 34.00    Types: Cigarettes   Smokeless tobacco: Never  Vaping Use   Vaping Use: Never used  Substance Use Topics   Alcohol use: Never   Drug use: Never    Allergies as of 10/05/2022 - Review Complete 10/05/2022  Allergen Reaction Noted   Farxiga [dapagliflozin] Other (See Comments) 11/21/2021   Sulfa antibiotics Swelling 04/23/2015   Iodinated contrast media Itching 08/21/2022    Review of Systems:    All systems reviewed and negative except where noted in HPI.   Physical Exam:  There were no vitals taken for this visit. No LMP recorded. Patient has had a hysterectomy. General:   Alert, pleasant and cooperative in NAD Head:  Normocephalic and atraumatic. Eyes:  Sclera clear, no icterus.   Conjunctiva pink. Ears:  Normal auditory acuity. Neck:  Supple; no masses or thyromegaly. Lungs:  Respirations even and unlabored.  Rhonchi throughout to auscultation.   No wheezes,  crackles, or rhonchi. No acute distress. Heart:  Regular rate and rhythm; no murmurs, clicks, rubs, or gallops. Abdomen:  Normal bowel sounds.  No bruits.  Soft, non-tender and non-distended without masses, hepatosplenomegaly or hernias noted.  No guarding or rebound tenderness.  Negative Carnett sign.   Rectal:  Deferred.  Pulses:  Normal pulses noted. Extremities:  No clubbing or edema.  No cyanosis. Neurologic:  Alert and oriented x3;  grossly normal neurologically. Skin:  Intact without significant lesions or rashes.  No jaundice. Lymph Nodes:  No significant cervical adenopathy. Psych:  Alert and cooperative. Normal mood and affect.  Imaging Studies: VAS [place] ABI WITH/WO TBI  Result Date: 09/06/2022 appear to be increased compared to prior study on 06/22/2022.  LOWER EXTREMITY  DOPPLER STUDY Patient Name:  TALIN DRYMAN  Date of Exam:   09/05/2022 Medical Rec #: [id number]        Accession #:    [id number] Date of Birth: 1940-12-06        Patient Gender: F Patient Age:   17 years Exam Location:  [facility] Procedure:      VAS [place] ABI WITH/WO TBI Referring Phys: Festus Barren --------------------------------------------------------------------------------  Indications: Peripheral artery disease. lt 2nd toe infection  Vascular Interventions: 08/07/2022: Aortogram and Selective Left Lower Extremity                         Angiogram. PTA of Left Posterior Tibial Artery with 2.5                         mm diameter and 3 cm angioplasty. PTA of the Left SFA                         with 5 mm diameter Lutonix drug coated angioplasty                         balloon. Placement to the Left SFA formgreater than 50%                         residual stenosis after angioplasty with a 6 mm diameter                         by 5 cm length Viabahn stent. Comparison Study: 06/22/2022 Performing Technologist: Debbe Bales RVS  Examination Guidelines: A complete evaluation includes at minimum, Doppler waveform  signals and systolic blood pressure reading at the level of bilateral brachial, anterior tibial, and posterior tibial arteries, when vessel segments are accessible. Bilateral testing is considered an integral part of a complete examination. Photoelectric Plethysmograph (PPG) waveforms and toe systolic pressure readings are included as required and additional duplex testing as needed. Limited examinations for reoccurring indications may be performed as noted.  ABI Findings: +---------+------------------+-----+----------+--------+ Right    Rt Pressure (mmHg)IndexWaveform  Comment  +---------+------------------+-----+----------+--------+ Brachial 148                                       +---------+------------------+-----+----------+--------+ ATA      81                0.55 monophasic         +---------+------------------+-----+----------+--------+ PTA      81                0.55 monophasic         +---------+------------------+-----+----------+--------+ Great Toe98                0.66 Abnormal           +---------+------------------+-----+----------+--------+ +---------+------------------+-----+--------+-------+ Left     Lt Pressure (mmHg)IndexWaveformComment +---------+------------------+-----+--------+-------+ Brachial 147                                    +---------+------------------+-----+--------+-------+ ATA      120               0.81 biphasic        +---------+------------------+-----+--------+-------+  PTA      133               0.90 biphasic        +---------+------------------+-----+--------+-------+ Great Toe139               0.94 Normal  2nd Toe +---------+------------------+-----+--------+-------+ +-------+-----------+-----------+------------+------------+ ABI/TBIToday's ABIToday's TBIPrevious ABIPrevious TBI +-------+-----------+-----------+------------+------------+ Right  .55        .66        .67         .40           +-------+-----------+-----------+------------+------------+ Left   .90        .94        .68         .47          +-------+-----------+-----------+------------+------------+ Bilateral TBIs appear increased compared to prior study on 06/22/2022. Right ABIs appear decreased compared to prior study on 06/22/2022. Left ABIs appear to be increased compared to prior study on 06/22/2022.  Summary: Right: Resting right ankle-brachial index indicates moderate right lower extremity arterial disease. The right toe-brachial index is abnormal. Left: Resting left ankle-brachial index indicates mild left lower extremity arterial disease. The left toe-brachial index is normal. *See table(s) above for measurements and observations.   Electronically signed by Festus Barren MD on 09/06/2022 at 8:27:01 AM.    Final     Assessment and Plan:   ARVETTA DEVALL is a 82 y.o. y/o female who comes in today with a finding on imaging showing a liver lesion.  The patient will have a MRI of the liver to rule out this being a hemangioma versus a possible hepatocellular carcinoma versus a metastatic lesion.  The patient will also have her lab sent off for alpha-fetoprotein and CEA.  The patient has been explained the plan and agrees with it.    Midge Minium, MD. Clementeen Graham    Note: This dictation was prepared with Dragon dictation along with smaller phrase technology. Any transcriptional errors that result from this process are unintentional.

## 2022-10-06 LAB — AFP TUMOR MARKER: AFP, Serum, Tumor Marker: 4.9 ng/mL (ref 0.0–8.7)

## 2022-10-06 LAB — CEA: CEA: 10.2 ng/mL — ABNORMAL HIGH (ref 0.0–4.7)

## 2022-10-10 ENCOUNTER — Ambulatory Visit
Admission: RE | Admit: 2022-10-10 | Discharge: 2022-10-10 | Disposition: A | Discharge status: Home / Self Care | Payer: Medicare Other | Source: Ambulatory Visit | Attending: Gastroenterology | Admitting: Gastroenterology

## 2022-10-10 DIAGNOSIS — R160 Hepatomegaly, not elsewhere classified: Secondary | ICD-10-CM | POA: Diagnosis present

## 2022-10-10 MED ORDER — GADOBUTROL 1 MMOL/ML IV SOLN
6.0000 mL | Freq: Once | INTRAVENOUS | Status: AC | PRN
  Administered 2022-10-10: 6 mL via INTRAVENOUS

## 2022-10-12 ENCOUNTER — Encounter: Payer: Self-pay | Admitting: Gastroenterology

## 2022-10-19 ENCOUNTER — Encounter: Payer: Self-pay | Admitting: Gastroenterology

## 2022-10-19 ENCOUNTER — Ambulatory Visit (INDEPENDENT_AMBULATORY_CARE_PROVIDER_SITE_OTHER): Payer: Medicare Other | Admitting: Gastroenterology

## 2022-10-19 VITALS — BP 111/69 | HR 98 | Temp 97.6°F | Wt 134.0 lb

## 2022-10-19 DIAGNOSIS — R160 Hepatomegaly, not elsewhere classified: Secondary | ICD-10-CM

## 2022-10-19 NOTE — Progress Notes (Signed)
Primary Care Physician: Dorothey Baseman, MD  Primary Gastroenterologist:  Dr. Midge Minium  Chief Complaint  Patient presents with   Follow-up   Results    HPI: Marissa Fletcher is a 82 y.o. female here for follow-up after having an MRI of the abdomen after a abnormal lesion was found on CT scan.  The patient's MRI showed:  IMPRESSION: 1. Numerous hypoenhancing liver masses, largest centered in the anterior left lobe of the liver, [name], measuring at least 8.5 x 6.3 cm, [name], 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, particularly in the left lobe of the liver, without common biliary ductal dilatation. This is likely related to mass effect from adjacent liver masses. 3. Peritoneal nodularity in the left paracolic gutter, assessment limited by breath motion artifact and field of view, although similar to prior. This is most consistent with peritoneal metastatic disease. 4. No candidate primary malignancy appreciated in the abdomen. 5. Hepatomegaly.  The patient now comes in to review the results.  The patient does have a history of lung cancer.  Past Medical History:  Diagnosis Date   Anxiety    Aortic atherosclerosis (HCC)    Arthritis    Cardiomyopathy (HCC)    a. 04/2021 Echo: EF 35-40%, glob HK. GrII DD.  Nl RV size/fxn. Mod dil LA. Mild MR.   Carotid arterial disease (HCC)    a. 04/2021 CTA Head/neck: RICA 60, LICA 55.   COPD (chronic obstructive pulmonary disease) (HCC)    Depression    Diabetes mellitus without complication (HCC)    History of kidney stones    Hypertension    Lung cancer (HCC)    Stroke (cerebrum) (HCC)    a. 04/2021 MRI brain: Acute infarct post  limb of R internal capsule.   Thoracic aortic aneurysm (HCC)    Tobacco abuse     Current Outpatient Medications  Medication Sig Dispense Refill   acetaminophen (TYLENOL) 500 MG tablet Take 2 tablets (1,000 mg total) by mouth 3 (three) times daily as needed for mild pain, moderate pain or headache. 30 tablet 0   ALPRAZolam (XANAX) 0.25 MG tablet Take 1 tablet (0.25 mg total) by mouth daily as needed for anxiety. 10 tablet 0   aspirin EC 81 MG EC tablet Take 1 tablet (81 mg total) by mouth daily. Swallow whole. 30 tablet 11   atorvastatin (LIPITOR) 20 MG tablet Take 20 mg by mouth every Monday, Wednesday, and Friday.     carvedilol (COREG) 6.25 MG tablet TAKE 1 TABLET BY MOUTH TWICE A DAY WITH A MEAL 60 tablet 2   clopidogrel (PLAVIX) 75 MG tablet Take 1 tablet (75 mg total) by mouth daily. 30 tablet 12   doxepin (SINEQUAN) 25 MG capsule Take 50-75 mg by mouth at bedtime.     ferrous sulfate 325 (65 FE) MG tablet Take 1 tablet (325 mg total) by mouth daily. 30 tablet 3   furosemide (LASIX) 20 MG tablet TAKE ONE TABLET BY MOUTH DAILY 30 tablet 4   gabapentin (NEURONTIN) 300 MG capsule Take 300 mg by mouth at bedtime. 200-300 mg when needed     magnesium oxide (MAG-OX) 400 (240 Mg) MG tablet Take 1 tablet (400 mg total) by mouth daily. 30 tablet 0  metFORMIN (GLUCOPHAGE-XR) 500 MG 24 hr tablet Take 500 mg by mouth 2 (two) times daily.     nystatin (MYCOSTATIN/NYSTOP) powder Apply 1 Application topically 3 (three) times daily. 15 g 0   PARoxetine (PAXIL) 30 MG tablet Take 30 mg by mouth every morning.     No current facility-administered medications for this visit.    Allergies as of 10/19/2022 - Review Complete 10/19/2022  Allergen Reaction Noted   Farxiga [dapagliflozin] Other (See Comments) 11/21/2021   Sulfa antibiotics Swelling 04/23/2015   Iodinated contrast media Itching 08/21/2022      Physical Examination:   There were no vitals taken for this visit.  General:  Well-nourished, well-developed in no acute distress.  Eyes: No icterus. Conjunctivae pink.  Neuro: Alert and oriented x 3.  Grossly intact. Skin: Warm and dry, no jaundice.   Psych: Alert and cooperative, normal mood and affect.  Labs:    Imaging Studies: MR LIVER W WO CONTRAST  Result Date: 10/11/2022 CLINICAL DATA:  Characterize suspected liver mass identified by prior CT EXAM: MRI ABDOMEN WITHOUT AND WITH CONTRAST TECHNIQUE: Multiplanar multisequence MR imaging of the abdomen was performed both before and after the administration of intravenous contrast. CONTRAST:  6mL GADAVIST GADOBUTROL 1 MMOL/ML IV SOLN COMPARISON:  CT abdomen pelvis, 06/05/2022 FINDINGS: Lower chest: No acute abnormality. Hepatobiliary: Hepatomegaly, maximum coronal span 21.5 cm. Numerous T2 hyperintense, heterogeneously hypoenhancing liver masses, largest centered in the anterior left lobe of the liver, [name], measuring at least 8.5 x 6.3 cm, previously 5.2 x 5.4 cm (series 12, image 7). Multiple additional lesions throughout the remainder of the liver, which were not appreciated by prior examination and presumably new or enlarged. Status post cholecystectomy. Areas of segmental biliary ductal dilatation, particularly in the left lobe of the liver (series 4, image 17), without common biliary ductal dilatation. Pancreas: Unremarkable. No pancreatic ductal dilatation or surrounding inflammatory changes. Spleen: Normal in size without significant abnormality. Adrenals/Urinary Tract: Adrenal glands are unremarkable. Simple, benign bilateral renal cortical cysts, for which no specific further follow-up or characterization is required. Kidneys are otherwise normal, without obvious renal calculi, solid lesion, or hydronephrosis. Stomach/Bowel: Stomach is within normal limits. No evidence of bowel wall thickening, distention, or inflammatory changes. Vascular/Lymphatic:  Severe, irregular mixed calcific atherosclerosis. No enlarged abdominal lymph nodes. Other: No abdominal wall hernia or abnormality. No ascites. Peritoneal nodularity in the left paracolic gutter, assessment limited by breath motion artifact and field of view, although similar to prior (series 4, image 41). Musculoskeletal: No acute or significant osseous findings. IMPRESSION: 1. Numerous hypoenhancing liver masses, largest centered in the anterior left lobe of the liver, [name], measuring at least 8.5 x 6.3 cm, [name], 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, particularly in the left lobe of the liver, without common biliary ductal dilatation. This is likely related to mass effect from adjacent liver masses. 3. Peritoneal nodularity in the left paracolic gutter, assessment limited by breath motion artifact and field of view, although similar to prior. This is most consistent with peritoneal metastatic disease. 4. No candidate primary malignancy appreciated in the abdomen. 5. Hepatomegaly. Electronically Signed   By: Jearld Lesch M.D.   On: 10/11/2022 17:25  Assessment and Plan:   LEVERNE MALZONE is a 82 y.o. y/o female who comes in for follow-up of the MRI.  The MRI was consistent with this lesion being a metastatic lesion in the liver.  With the patient's history of lung cancer this is worrisome for recurrence of that cancer.  The patient was followed in the past by Dr. Cathie Hoops and will be sent back to Dr. Cathie Hoops for further evaluation.  The patient and her family have been explained the plan and agree with it.     Midge Minium, MD. Clementeen Graham    Note: This dictation was prepared with Dragon dictation along with smaller  phrase technology. Any transcriptional errors that result from this process are unintentional.

## 2022-10-20 ENCOUNTER — Encounter: Payer: Self-pay | Admitting: Gastroenterology

## 2022-10-20 ENCOUNTER — Telehealth: Payer: Self-pay

## 2022-10-20 NOTE — Addendum Note (Signed)
Addended by: Roena Malady on: 10/20/2022 09:49 AM   Modules accepted: Orders

## 2022-10-20 NOTE — Telephone Encounter (Signed)
-----   Message from Rickard Patience, MD sent at 10/20/2022 12:30 PM EDT ----- Regarding: FW: Appointment needed Please schedule urgent follow up appt with me MD only first followed by lab. S zy ----- Message ----- From: Roena Malady, CMA Sent: 10/20/2022   8:42 AM EDT To: Rickard Patience, MD Subject: Appointment needed                             Good morning,  Dr. Servando Snare saw this patient yesterday. He would like patient to be scheduled with you as soon as possible. MRI was consistent with this lesion being a metastatic lesion in the liver.   Please call sister to schedule.   Thank you

## 2022-10-20 NOTE — Telephone Encounter (Signed)
Scheduling will you schedule and notify patient of appointment date and time.

## 2022-10-20 NOTE — Telephone Encounter (Signed)
New patient

## 2022-10-25 ENCOUNTER — Other Ambulatory Visit: Payer: Self-pay

## 2022-10-25 ENCOUNTER — Inpatient Hospital Stay: Payer: Medicare Other

## 2022-10-25 ENCOUNTER — Telehealth: Payer: Self-pay

## 2022-10-25 ENCOUNTER — Encounter: Payer: Self-pay | Admitting: Oncology

## 2022-10-25 ENCOUNTER — Inpatient Hospital Stay: Payer: Medicare Other | Attending: Oncology | Admitting: Oncology

## 2022-10-25 VITALS — BP 136/85 | HR 99 | Temp 97.9°F | Resp 18 | Wt 133.6 lb

## 2022-10-25 DIAGNOSIS — F17210 Nicotine dependence, cigarettes, uncomplicated: Secondary | ICD-10-CM | POA: Diagnosis not present

## 2022-10-25 DIAGNOSIS — C3491 Malignant neoplasm of unspecified part of right bronchus or lung: Secondary | ICD-10-CM | POA: Diagnosis not present

## 2022-10-25 DIAGNOSIS — R160 Hepatomegaly, not elsewhere classified: Secondary | ICD-10-CM | POA: Insufficient documentation

## 2022-10-25 DIAGNOSIS — R978 Other abnormal tumor markers: Secondary | ICD-10-CM | POA: Insufficient documentation

## 2022-10-25 DIAGNOSIS — R233 Spontaneous ecchymoses: Secondary | ICD-10-CM | POA: Diagnosis not present

## 2022-10-25 DIAGNOSIS — Z8052 Family history of malignant neoplasm of bladder: Secondary | ICD-10-CM | POA: Diagnosis not present

## 2022-10-25 DIAGNOSIS — C221 Intrahepatic bile duct carcinoma: Secondary | ICD-10-CM | POA: Insufficient documentation

## 2022-10-25 DIAGNOSIS — D509 Iron deficiency anemia, unspecified: Secondary | ICD-10-CM

## 2022-10-25 DIAGNOSIS — Z923 Personal history of irradiation: Secondary | ICD-10-CM | POA: Insufficient documentation

## 2022-10-25 DIAGNOSIS — Z720 Tobacco use: Secondary | ICD-10-CM

## 2022-10-25 LAB — PROTIME-INR
INR: 1 (ref 0.8–1.2)
Prothrombin Time: 13.4 seconds (ref 11.4–15.2)

## 2022-10-25 LAB — CBC WITH DIFFERENTIAL (CANCER CENTER ONLY)
Abs Immature Granulocytes: 0.01 10*3/uL (ref 0.00–0.07)
Basophils Absolute: 0 10*3/uL (ref 0.0–0.1)
Basophils Relative: 1 %
Eosinophils Absolute: 0.2 10*3/uL (ref 0.0–0.5)
Eosinophils Relative: 3 %
HCT: 37.4 % (ref 36.0–46.0)
Hemoglobin: 11.4 g/dL — ABNORMAL LOW (ref 12.0–15.0)
Immature Granulocytes: 0 %
Lymphocytes Relative: 24 %
Lymphs Abs: 1.4 10*3/uL (ref 0.7–4.0)
MCH: 24.9 pg — ABNORMAL LOW (ref 26.0–34.0)
MCHC: 30.5 g/dL (ref 30.0–36.0)
MCV: 81.7 fL (ref 80.0–100.0)
Monocytes Absolute: 0.4 10*3/uL (ref 0.1–1.0)
Monocytes Relative: 8 %
Neutro Abs: 3.6 10*3/uL (ref 1.7–7.7)
Neutrophils Relative %: 64 %
Platelet Count: 279 10*3/uL (ref 150–400)
RBC: 4.58 MIL/uL (ref 3.87–5.11)
RDW: 18.9 % — ABNORMAL HIGH (ref 11.5–15.5)
WBC Count: 5.6 10*3/uL (ref 4.0–10.5)
nRBC: 0 % (ref 0.0–0.2)

## 2022-10-25 LAB — CMP (CANCER CENTER ONLY)
ALT: 17 U/L (ref 0–44)
AST: 23 U/L (ref 15–41)
Albumin: 3 g/dL — ABNORMAL LOW (ref 3.5–5.0)
Alkaline Phosphatase: 165 U/L — ABNORMAL HIGH (ref 38–126)
Anion gap: 8 (ref 5–15)
BUN: 10 mg/dL (ref 8–23)
CO2: 27 mmol/L (ref 22–32)
Calcium: 8.4 mg/dL — ABNORMAL LOW (ref 8.9–10.3)
Chloride: 101 mmol/L (ref 98–111)
Creatinine: 0.39 mg/dL — ABNORMAL LOW (ref 0.44–1.00)
GFR, Estimated: >60 mL/min (ref >60)
Glucose, Bld: 110 mg/dL — ABNORMAL HIGH (ref 70–99)
Potassium: 4.1 mmol/L (ref 3.5–5.1)
Sodium: 136 mmol/L (ref 135–145)
Total Bilirubin: 0.5 mg/dL (ref 0.3–1.2)
Total Protein: 6.5 g/dL (ref 6.5–8.1)

## 2022-10-25 LAB — IRON AND TIBC
Iron: 32 ug/dL (ref 28–170)
Saturation Ratios: 9 % — ABNORMAL LOW (ref 10.4–31.8)
TIBC: 340 ug/dL (ref 250–450)
UIBC: 308 ug/dL

## 2022-10-25 LAB — FERRITIN: Ferritin: 17 ng/mL (ref 11–307)

## 2022-10-25 LAB — APTT: aPTT: 27 seconds (ref 24–36)

## 2022-10-25 NOTE — Assessment & Plan Note (Signed)
Check PT PTT

## 2022-10-25 NOTE — Assessment & Plan Note (Addendum)
Imaging was reviewed and discussed with patient and her sister. Findings are very concerning for underlying malignancy.  Primary liver cancer versus metastasis [ GI, lung origin, etc] aFP is negative, CEA is elevated at 10.   I ultrasound-guided biopsy to establish tissue diagnosis. Recommend PET scan evaluation of disease extent and provide additional information for primary site. Check CBC, CMP, iron TIBC ferritin, CA 19-9 Patient has multiple comorbidities, she is interested in establishing cancer diagnosis first and we will further discuss about available options for her. Refer to palliative care

## 2022-10-25 NOTE — Telephone Encounter (Signed)
Request for STAT liver biopsy faxed to IR.   MD 1 week after biopsy

## 2022-10-25 NOTE — Progress Notes (Signed)
Hematology/Oncology Consult Note [phone] [phone]     REFERRING PROVIDER: Dorothey Baseman, MD    CHIEF COMPLAINTS/PURPOSE OF CONSULTATION:  Liver mass  ASSESSMENT & PLAN:   Liver mass Imaging was reviewed and discussed with patient and her sister. Findings are very concerning for underlying malignancy.  Primary liver cancer versus metastasis [ GI, lung origin, etc] aFP is negative, CEA is elevated at 10.   I ultrasound-guided biopsy to establish tissue diagnosis. Recommend PET scan evaluation of disease extent and provide additional information for primary site. Check CBC, CMP, iron TIBC ferritin, CA 19-9 Patient has multiple comorbidities, she is interested in establishing cancer diagnosis first and we will further discuss about available options for her. Refer to palliative care  Primary adenocarcinoma of right lung [facility]) Pending above workup.  Easy bruising Check PT PTT  Tobacco abuse Recommend smoke cessation.   Orders Placed This Encounter  Procedures   NM PET Image Initial (PI) Skull Base To Thigh    Standing Status:   Future    Standing Expiration Date:   10/25/2023    Order Specific Question:   If indicated for the ordered procedure, I authorize the administration of a radiopharmaceutical per Radiology protocol    Answer:   Yes    Order Specific Question:   Preferred imaging location?    Answer:   [facility]   US BIOPSY (LIVER)    Standing Status:   Future    Standing Expiration Date:   10/25/2023    Order Specific Question:   Reason for exam:    Answer:   liver mass    Order Specific Question:   Preferred imaging location?    Answer:   [facility]   CMP (Cancer Center only)    Standing Status:   Future    Number of Occurrences:   1    Standing Expiration Date:   10/25/2023   CBC with Differential (Cancer Center Only)    Standing Status:   Future    Number of Occurrences:   1    Standing Expiration Date:    10/25/2023   Iron and TIBC    Standing Status:   Future    Number of Occurrences:   1    Standing Expiration Date:   10/25/2023   Ferritin    Standing Status:   Future    Number of Occurrences:   1    Standing Expiration Date:   10/25/2023   Cancer antigen 19-9    Standing Status:   Future    Number of Occurrences:   1    Standing Expiration Date:   10/25/2023   Protime-INR    Standing Status:   Future    Number of Occurrences:   1    Standing Expiration Date:   10/25/2023   APTT    Standing Status:   Future    Number of Occurrences:   1    Standing Expiration Date:   10/25/2023   Ambulatory Referral to Palliative Care    Referral Priority:   Routine    Referral Type:   Consultation    Referral Reason:   Advance Care Planning    Number of Visits Requested:   1   Follow up after biopsy All questions were answered. The patient knows to call the clinic with any problems, questions or concerns.  Rickard Patience, MD, PhD [facility] Hematology Oncology 10/25/2022    HISTORY OF PRESENTING ILLNESS:  Marissa Fletcher 82  y.o. female presents to establish care for liver mass.  I have reviewed her chart and materials related to her cancer extensively and collaborated history with the patient. Summary of oncologic history is as follows: Oncology History  Primary adenocarcinoma of right lung (HCC)  08/17/2020 Initial Diagnosis   Primary adenocarcinoma of right lung (HCC)  05/10/2020-05/12/2020, [name], indeterminate right hilar and precarinal borderline enlarged lymphadenopathy. Hepatomegaly.  Sigmoid diverticulosis with no diverticulitis.  Descending thoracic aorta aneurysm 4 cm.  Extensive calcified and noncalcified atherosclerotic plaque of the thoracic and abdominal aorta. 07/13/2020, PET scan showed marked hypermetabolic him  associated with the right upper lobe nodule compatible with bronchogenic neoplasm. No adenopathy or distant metastasis disease. Small nonspecific pulmonary nodules without changes or increased metabolic activity. Compression fracture of the T10, nonspecific metabolic activity. Nonspecific diffuse colonic activity. Thoracic and abdominal aortic aneurysm  07/30/2020, T10 kyphoplasty. T10 biopsy negative for malignancy.   09/13/20 -09/27/2020 lung radiation.      08/17/2020 Cancer Staging   Staging form: Lung, AJCC 8th Edition - Clinical stage from 08/17/2020: Stage IA2 (cT1b, [name], [name], MD on 08/17/2020    She did not follow-up with oncology after her radiation. 05/19/2022, CT chest without contrast showed development of 5.3 cm mass in the left hepatic lobe not well-characterized. 06/05/2022, CT angio abdomen/pelvis with and without contrast again showed new 40 defined low-density lesion involving the left hepatic lobe.  Patient was referred to see GI and patient did not get an appointment until May 2024. Patient was seen by Dr. Daleen Squibb and was recommended to get MRI liver with and without contrast.  10/10/2022, MRI liver with and without contrast showed 1. Numerous hypoenhancing liver masses, largest centered in the anterior left lobe of the liver, [name], measuring at least 8.5 x 6.3 cm, [name], 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, particularly in the left lobe of the liver, [name], assessment limited by breath motion artifact and field of  view, although similar to prior. This is most consistent with peritoneal metastatic disease. 4. No candidate primary malignancy appreciated in the abdomen. 5. Hepatomegaly  Patient was referred to oncology for further evaluation. Patient reports fatigue.  Appetite is fair.  She denies any pain.  Denies any unintentional weight loss.  Denies any blood in the stool or dark stool.  Denies shortness of breath more than her baseline. Easy bruising, no active bleeding events. She was accompanied by her Sister Diane.  MEDICAL HISTORY:  Past Medical History:  Diagnosis Date   Anxiety    Aortic atherosclerosis (HCC)    Arthritis    Cardiomyopathy (HCC)    a. 04/2021 Echo: EF 35-40%, glob HK. GrII DD.  Nl RV size/fxn. Mod dil LA. Mild MR.   Carotid arterial disease (HCC)    a. 04/2021 CTA Head/neck: RICA 60, LICA 55.   COPD (chronic obstructive pulmonary disease) (HCC)    Depression    Diabetes mellitus without complication (  HCC)    History of kidney stones    Hypertension    Lung cancer (HCC)    Stroke (cerebrum) (HCC)    a. 04/2021 MRI brain: Acute infarct post limb of R internal capsule.   Thoracic aortic aneurysm (HCC)    Tobacco abuse     SURGICAL HISTORY: Past Surgical History:  Procedure Laterality Date   ABDOMINAL HYSTERECTOMY     CHOLECYSTECTOMY     EYE SURGERY Bilateral    IR KYPHO THORACIC WITH BONE BIOPSY  11/24/2021   IR RADIOLOGIST EVAL & MGMT  12/13/2021   KYPHOPLASTY N/A 07/30/2020   Procedure: T10 Kyphoplasty;  Surgeon: Kennedy Bucker, MD;  Location: [facility] ORS;  Service: Orthopedics;  Laterality: N/A;  T10   KYPHOPLASTY N/A 08/19/2020   Procedure: T9 YPHOPLASTY;  Surgeon: Kennedy Bucker, MD;  Location: [facility] ORS;  Service: Orthopedics;  Laterality: N/A;   LOWER EXTREMITY ANGIOGRAPHY Left 08/07/2022   Procedure: Lower Extremity Angiography;  Surgeon: Annice Needy, MD;  Location: [facility] INVASIVE CV LAB;  Service: Cardiovascular;  Laterality: Left;    SOCIAL HISTORY: Social  History   Socioeconomic History   Marital status: Single    Spouse name: Not on file   Number of children: Not on file   Years of education: Not on file   Highest education level: Not on file  Occupational History   Not on file  Tobacco Use   Smoking status: Every Day    Packs/day: 1.00    Years: 68.00    Additional pack years: 0.00    Total pack years: 68.00    Types: Cigarettes   Smokeless tobacco: Never  Vaping Use   Vaping Use: Never used  Substance and Sexual Activity   Alcohol use: Never   Drug use: Never   Sexual activity: Not on file  Other Topics Concern   Not on file  Social History Narrative   Lives locally.  Does not routinely exercise.   Social Determinants of Health   Financial Resource Strain: Not on file  Food Insecurity: Not on file  Transportation Needs: Not on file  Physical Activity: Not on file  Stress: Not on file  Social Connections: Not on file  Intimate Partner Violence: Not on file    FAMILY HISTORY: Family History  Problem Relation Age of Onset   Dementia Mother    Congestive Heart Failure Mother    Bladder Cancer Father    Heart disease Father    Breast cancer Neg Hx     ALLERGIES:  is allergic to farxiga [dapagliflozin], sulfa antibiotics, and iodinated contrast media.  MEDICATIONS:  Current Outpatient Medications  Medication Sig Dispense Refill   acetaminophen (TYLENOL) 500 MG tablet Take 2 tablets (1,000 mg total) by mouth 3 (three) times daily as needed for mild pain, moderate pain or headache. 30 tablet 0   ALPRAZolam (XANAX) 0.25 MG tablet Take 1 tablet (0.25 mg total) by mouth daily as needed for anxiety. 10 tablet 0   aspirin EC 81 MG EC tablet Take 1 tablet (81 mg total) by mouth daily. Swallow whole. 30 tablet 11   atorvastatin (LIPITOR) 20 MG tablet Take 20 mg by mouth every Monday, Wednesday, and Friday.     carvedilol (COREG) 6.25 MG tablet TAKE 1 TABLET BY MOUTH TWICE A DAY WITH A MEAL 60 tablet 2   clopidogrel  (PLAVIX) 75 MG tablet Take 1 tablet (75 mg total) by mouth daily. 30 tablet 12   cyanocobalamin (VITAMIN B12) 1000 MCG tablet  Take 1,000 mcg by mouth daily.     doxepin (SINEQUAN) 25 MG capsule Take 50-75 mg by mouth at bedtime.     furosemide (LASIX) 20 MG tablet TAKE ONE TABLET BY MOUTH DAILY 30 tablet 4   gabapentin (NEURONTIN) 300 MG capsule Take 300 mg by mouth at bedtime. 200-300 mg when needed     magnesium oxide (MAG-OX) 400 (240 Mg) MG tablet Take 1 tablet (400 mg total) by mouth daily. 30 tablet 0   Melatonin 10 MG TABS Take 1 tablet by mouth at bedtime.     metFORMIN (GLUCOPHAGE-XR) 500 MG 24 hr tablet Take 500 mg by mouth 2 (two) times daily.     PARoxetine (PAXIL) 30 MG tablet Take 30 mg by mouth every morning.     ferrous sulfate 325 (65 FE) MG tablet Take 1 tablet (325 mg total) by mouth daily. (Patient not taking: Reported on 10/25/2022) 30 tablet 3   No current facility-administered medications for this visit.    Review of Systems  Constitutional:  Positive for fatigue. Negative for appetite change, chills and fever.  HENT:   Negative for hearing loss and voice change.   Eyes:  Negative for eye problems.  Respiratory:  Negative for chest tightness and cough.   Cardiovascular:  Negative for chest pain.  Gastrointestinal:  Negative for abdominal distention, abdominal pain and blood in stool.       Hepatomegaly  Endocrine: Negative for hot flashes.  Genitourinary:  Negative for difficulty urinating and frequency.   Musculoskeletal:  Negative for arthralgias.       Scoliosis  Skin:  Negative for itching and rash.  Neurological:  Negative for extremity weakness.  Hematological:  Negative for adenopathy.  Psychiatric/Behavioral:  Negative for confusion.      PHYSICAL EXAMINATION: ECOG PERFORMANCE STATUS: 2 - Symptomatic, <50% confined to bed  Vitals:   10/25/22 1049  BP: 136/85  Pulse: 99  Resp: 18  Temp: 97.9 F (36.6 C)  SpO2: 98%   Filed Weights   10/25/22  1049  Weight: 133 lb 9.6 oz (60.6 kg)    Physical Exam Constitutional:      General: She is not in acute distress.    Appearance: She is obese. She is not diaphoretic.     Comments: Frail appearance female sitting in the wheelchair  HENT:     Head: Normocephalic and atraumatic.  Eyes:     General: No scleral icterus.    Pupils: Pupils are equal, round, and reactive to light.  Cardiovascular:     Rate and Rhythm: Normal rate and regular rhythm.     Heart sounds: No murmur heard. Pulmonary:     Effort: Pulmonary effort is normal. No respiratory distress.  Abdominal:     General: Bowel sounds are normal. There is no distension.     Palpations: Abdomen is soft.  Musculoskeletal:        General: Normal range of motion.     Cervical back: Normal range of motion and neck supple.  Skin:    General: Skin is warm and dry.     Findings: No erythema.  Neurological:     Mental Status: She is alert and oriented to person, place, and time. Mental status is at baseline.     Cranial Nerves: No cranial nerve deficit.     Motor: No abnormal muscle tone.  Psychiatric:        Mood and Affect: Mood and affect normal.      LABORATORY DATA:  I have reviewed the data as listed    Latest Ref Rng & Units 10/25/2022   11:30 AM 05/19/2022   12:59 PM 03/01/2022    6:04 AM  CBC  WBC 4.0 - 10.5 K/uL 5.6  7.8    Hemoglobin 12.0 - 15.0 g/dL 16.1  8.8  09.6   Hematocrit 36.0 - 46.0 % 37.4  30.9  32.0   Platelets 150 - 400 K/uL 279  367        Latest Ref Rng & Units 10/25/2022   11:30 AM 08/21/2022    3:04 PM 08/07/2022    9:03 AM  CMP  Glucose 70 - 99 mg/dL 045  409    BUN 8 - 23 mg/dL 10  9  12    Creatinine 0.44 - 1.00 mg/dL 8.11  9.14  7.82   Sodium 135 - 145 mmol/L 136  139    Potassium 3.5 - 5.1 mmol/L 4.1  3.8    Chloride 98 - 111 mmol/L 101  105    CO2 22 - 32 mmol/L 27  26    Calcium 8.9 - 10.3 mg/dL 8.4  9.0    Total Protein 6.5 - 8.1 g/dL 6.5     Total Bilirubin 0.3 - 1.2 mg/dL 0.5      Alkaline Phos 38 - 126 U/L 165     AST 15 - 41 U/L 23     ALT 0 - 44 U/L 17        RADIOGRAPHIC STUDIES: I have personally reviewed the radiological images as listed and agreed with the findings in the report. MR LIVER W WO CONTRAST  Result Date: 10/11/2022 CLINICAL DATA:  Characterize suspected liver mass identified by prior CT EXAM: MRI ABDOMEN WITHOUT AND WITH CONTRAST TECHNIQUE: Multiplanar multisequence MR imaging of the abdomen was performed both before and after the administration of intravenous contrast. CONTRAST:  6mL GADAVIST GADOBUTROL 1 MMOL/ML IV SOLN COMPARISON:  CT abdomen pelvis, 06/05/2022 FINDINGS: Lower chest: No acute abnormality. Hepatobiliary: Hepatomegaly, maximum coronal span 21.5 cm. Numerous T2 hyperintense, heterogeneously hypoenhancing liver masses, largest centered in the anterior left lobe of the liver, [name], measuring at least 8.5 x 6.3 cm, previously 5.2 x 5.4 cm (series 12, image 7). Multiple additional lesions throughout the remainder of the liver, which were not appreciated by prior examination and presumably new or enlarged. Status post cholecystectomy. Areas of segmental biliary ductal dilatation, particularly in the left lobe of the liver (series 4, image 17), without common biliary ductal dilatation. Pancreas: Unremarkable. No pancreatic ductal dilatation or surrounding inflammatory changes. Spleen: Normal in size without significant abnormality. Adrenals/Urinary Tract: Adrenal glands are unremarkable. Simple, benign bilateral renal cortical cysts, for which no specific further follow-up or characterization is required. Kidneys are otherwise normal, without obvious renal calculi, solid lesion, or hydronephrosis. Stomach/Bowel: Stomach is within normal limits. No evidence of bowel wall thickening, distention, or inflammatory changes. Vascular/Lymphatic: Severe, irregular mixed calcific atherosclerosis.  No enlarged abdominal lymph nodes. Other: No abdominal wall hernia or abnormality. No ascites. Peritoneal nodularity in the left paracolic gutter, assessment limited by breath motion artifact and field of view, although similar to prior (series 4, image 41). Musculoskeletal: No acute or significant osseous findings. IMPRESSION: 1. Numerous hypoenhancing liver masses, largest centered in the anterior left lobe of the liver, [name], measuring at least 8.5 x 6.3 cm, previously 5.2 x 5.4 cm. Multiple additional lesions  throughout the remainder of the liver, 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, particularly in the left lobe of the liver, without common biliary ductal dilatation. This is likely related to mass effect from adjacent liver masses. 3. Peritoneal nodularity in the left paracolic gutter, assessment limited by breath motion artifact and field of view, although similar to prior. This is most consistent with peritoneal metastatic disease. 4. No candidate primary malignancy appreciated in the abdomen. 5. Hepatomegaly. Electronically Signed   By: Jearld Lesch M.D.   On: 10/11/2022 17:25

## 2022-10-25 NOTE — Assessment & Plan Note (Signed)
Recommend smoke cessation.  

## 2022-10-25 NOTE — Assessment & Plan Note (Signed)
Pending above work up 

## 2022-10-25 NOTE — Progress Notes (Signed)
Error

## 2022-10-25 NOTE — Telephone Encounter (Signed)
Marissa Fletcher is going through and oncological workup with Dr. Cathie Hoops. Our records indicate that the patient is taking Plavix 75mg  and Aspirin 81 mg, both prescribed by Dr. Wyn Quaker. Dr. Cathie Hoops will need to obtain a liver biopsy with  will require for pt to hold Plavix for 5 days and Aspirin for 3 days.   Dr. Fara Olden you please co-sign if agreeable to holding medication as directed above, for liver biopsy.

## 2022-10-27 ENCOUNTER — Ambulatory Visit
Admission: RE | Admit: 2022-10-27 | Discharge: 2022-10-27 | Disposition: A | Discharge status: Home / Self Care | Payer: Medicare Other | Source: Ambulatory Visit | Attending: Oncology | Admitting: Oncology

## 2022-10-27 DIAGNOSIS — C787 Secondary malignant neoplasm of liver and intrahepatic bile duct: Secondary | ICD-10-CM | POA: Diagnosis not present

## 2022-10-27 DIAGNOSIS — Z85118 Personal history of other malignant neoplasm of bronchus and lung: Secondary | ICD-10-CM | POA: Diagnosis not present

## 2022-10-27 DIAGNOSIS — R160 Hepatomegaly, not elsewhere classified: Secondary | ICD-10-CM | POA: Diagnosis present

## 2022-10-27 LAB — CANCER ANTIGEN 19-9: CA 19-9: 471 U/mL — ABNORMAL HIGH (ref 0–35)

## 2022-10-27 LAB — GLUCOSE, CAPILLARY: Glucose-Capillary: 99 mg/dL (ref 70–99)

## 2022-10-27 MED ORDER — FLUDEOXYGLUCOSE F - 18 (FDG) INJECTION
6.9000 | Freq: Once | INTRAVENOUS | Status: AC | PRN
  Administered 2022-10-27: 7.45 via INTRAVENOUS

## 2022-10-27 NOTE — Telephone Encounter (Signed)
Patient scheduled for biopsy on 6/13 at 8:30a  with arrival time 7:30a. The last day to take plavix is 6/7 and the last day to take ASA 81mg  is 6/9. Spoke to Diane and informed her of this.   Please schedule MD only (for biopsy results) approx 1 week after 6/13 and inform Diane of appt details.

## 2022-10-27 NOTE — Progress Notes (Signed)
Sterling Big, MD sent to Markus Daft; P Ir Procedure Requests Approved for US guided biopsy of liver lesion - mets of ? Primary.  HKM       Previous Messages    ----- Message ----- From: Markus Daft Sent: 10/25/2022  12:20 PM EDT To: Ir Procedure Requests Subject: US Liver                                      IR Approval Request:   Procedure:   US guided Liver Mass Biopsy  Reason:       liver mass  History:        MR Liver w/wo contrast   10/10/2022  Provider:     Dr Rickard Patience, MD    Provider Contact #   [facility]   [phone]

## 2022-10-27 NOTE — Progress Notes (Signed)
[name], MD  [name] D OK for US guided liver lesion Bx.  Abdominal MRI - 10/10/22 - Multiple lesions; reference lesion on mage 26, series 16.  Nedra Hai

## 2022-11-01 ENCOUNTER — Other Ambulatory Visit: Payer: Self-pay | Admitting: Internal Medicine

## 2022-11-01 DIAGNOSIS — R160 Hepatomegaly, not elsewhere classified: Secondary | ICD-10-CM

## 2022-11-01 NOTE — Progress Notes (Signed)
Patient for US guided Liver Biopsy on Thurs 11/02/2022, I called and spoke with the patient's sister, Diane on the phone and gave pre-procedure instructions. Diane was made aware to have the patient here at 7:30a, last dose of Plavix was on Fri 10/27/22 and last dose of ASA 81mg  was on Sundy 10/29/22, NPO after MN prior to procedure as well as driver post procedure/recovery/discharge. Diane stated understanding.  Called 10/26/2022 and 11/01/2022

## 2022-11-02 ENCOUNTER — Ambulatory Visit
Admission: RE | Admit: 2022-11-02 | Discharge: 2022-11-02 | Disposition: A | Discharge status: Home / Self Care | Payer: Medicare Other | Source: Ambulatory Visit | Attending: Oncology | Admitting: Oncology

## 2022-11-02 ENCOUNTER — Other Ambulatory Visit: Payer: Self-pay

## 2022-11-02 DIAGNOSIS — I428 Other cardiomyopathies: Secondary | ICD-10-CM | POA: Insufficient documentation

## 2022-11-02 DIAGNOSIS — Z7984 Long term (current) use of oral hypoglycemic drugs: Secondary | ICD-10-CM | POA: Insufficient documentation

## 2022-11-02 DIAGNOSIS — I700 Atherosclerosis of aorta: Secondary | ICD-10-CM | POA: Insufficient documentation

## 2022-11-02 DIAGNOSIS — I712 Thoracic aortic aneurysm, without rupture, unspecified: Secondary | ICD-10-CM | POA: Diagnosis not present

## 2022-11-02 DIAGNOSIS — J449 Chronic obstructive pulmonary disease, unspecified: Secondary | ICD-10-CM | POA: Diagnosis not present

## 2022-11-02 DIAGNOSIS — C787 Secondary malignant neoplasm of liver and intrahepatic bile duct: Secondary | ICD-10-CM | POA: Diagnosis not present

## 2022-11-02 DIAGNOSIS — R160 Hepatomegaly, not elsewhere classified: Secondary | ICD-10-CM

## 2022-11-02 DIAGNOSIS — I10 Essential (primary) hypertension: Secondary | ICD-10-CM | POA: Diagnosis not present

## 2022-11-02 DIAGNOSIS — E119 Type 2 diabetes mellitus without complications: Secondary | ICD-10-CM | POA: Insufficient documentation

## 2022-11-02 LAB — CBC WITH DIFFERENTIAL/PLATELET
Abs Immature Granulocytes: 0.02 10*3/uL (ref 0.00–0.07)
Basophils Absolute: 0 10*3/uL (ref 0.0–0.1)
Basophils Relative: 1 %
Eosinophils Absolute: 0.1 10*3/uL (ref 0.0–0.5)
Eosinophils Relative: 2 %
HCT: 39.7 % (ref 36.0–46.0)
Hemoglobin: 11.8 g/dL — ABNORMAL LOW (ref 12.0–15.0)
Immature Granulocytes: 0 %
Lymphocytes Relative: 18 %
Lymphs Abs: 1.2 10*3/uL (ref 0.7–4.0)
MCH: 24.7 pg — ABNORMAL LOW (ref 26.0–34.0)
MCHC: 29.7 g/dL — ABNORMAL LOW (ref 30.0–36.0)
MCV: 83.2 fL (ref 80.0–100.0)
Monocytes Absolute: 0.6 10*3/uL (ref 0.1–1.0)
Monocytes Relative: 9 %
Neutro Abs: 4.6 10*3/uL (ref 1.7–7.7)
Neutrophils Relative %: 70 %
Platelets: 298 10*3/uL (ref 150–400)
RBC: 4.77 MIL/uL (ref 3.87–5.11)
RDW: 18.5 % — ABNORMAL HIGH (ref 11.5–15.5)
WBC: 6.5 10*3/uL (ref 4.0–10.5)
nRBC: 0 % (ref 0.0–0.2)

## 2022-11-02 LAB — COMPREHENSIVE METABOLIC PANEL
ALT: 15 U/L (ref 0–44)
AST: 26 U/L (ref 15–41)
Albumin: 3.1 g/dL — ABNORMAL LOW (ref 3.5–5.0)
Alkaline Phosphatase: 173 U/L — ABNORMAL HIGH (ref 38–126)
Anion gap: 9 (ref 5–15)
BUN: 14 mg/dL (ref 8–23)
CO2: 26 mmol/L (ref 22–32)
Calcium: 8.8 mg/dL — ABNORMAL LOW (ref 8.9–10.3)
Chloride: 102 mmol/L (ref 98–111)
Creatinine, Ser: 0.52 mg/dL (ref 0.44–1.00)
GFR, Estimated: >60 mL/min (ref >60)
Glucose, Bld: 132 mg/dL — ABNORMAL HIGH (ref 70–99)
Potassium: 4.3 mmol/L (ref 3.5–5.1)
Sodium: 137 mmol/L (ref 135–145)
Total Bilirubin: 0.3 mg/dL (ref 0.3–1.2)
Total Protein: 6.9 g/dL (ref 6.5–8.1)

## 2022-11-02 LAB — GLUCOSE, CAPILLARY
Glucose-Capillary: 123 mg/dL — ABNORMAL HIGH (ref 70–99)
Glucose-Capillary: 122 mg/dL — ABNORMAL HIGH (ref 70–99)

## 2022-11-02 LAB — PROTIME-INR
INR: 0.9 (ref 0.8–1.2)
Prothrombin Time: 12.7 seconds (ref 11.4–15.2)

## 2022-11-02 MED ORDER — LIDOCAINE HCL (PF) 1 % IJ SOLN
10.0000 mL | Freq: Once | INTRAMUSCULAR | Status: AC
  Administered 2022-11-02: 10 mL via INTRADERMAL
  Filled 2022-11-02: qty 10

## 2022-11-02 MED ORDER — MIDAZOLAM HCL 2 MG/2ML IJ SOLN
INTRAMUSCULAR | Status: AC
  Filled 2022-11-02: qty 2

## 2022-11-02 MED ORDER — SODIUM CHLORIDE 0.9 % IV SOLN: INTRAVENOUS | Status: DC

## 2022-11-02 MED ORDER — MIDAZOLAM HCL 2 MG/2ML IJ SOLN
INTRAMUSCULAR | Status: AC | PRN
  Administered 2022-11-02: .5 mg via INTRAVENOUS

## 2022-11-02 MED ORDER — IBUPROFEN 400 MG PO TABS
400.0000 mg | ORAL_TABLET | Freq: Four times a day (QID) | ORAL | Status: DC | PRN
  Administered 2022-11-02: 400 mg via ORAL
  Filled 2022-11-02 (×2): qty 1

## 2022-11-02 MED ORDER — FENTANYL CITRATE (PF) 100 MCG/2ML IJ SOLN
INTRAMUSCULAR | Status: AC | PRN
  Administered 2022-11-02 (×2): 25 ug via INTRAVENOUS

## 2022-11-02 MED ORDER — FENTANYL CITRATE (PF) 100 MCG/2ML IJ SOLN
INTRAMUSCULAR | Status: AC
  Filled 2022-11-02: qty 2

## 2022-11-02 NOTE — H&P (Signed)
Chief Complaint: Patient was seen in consultation today for liver mass  Referring Physician(s): Yu,Zhou  Supervising Physician: Pernell Dupre  Patient Status: [facility] - Out-pt  History of Present Illness: Marissa Fletcher is a 82 y.o. female with PMH significant for anxiety, aortic atherosclerosis, cardiomyopathy, COPD, diabetes mellitus, hypertension, lung cancer, stroke in 2022, and thoracic aortic aneurysm being seen today in relation to a liver mass. Patient has a liver mass seen on recent MRI that has enlarged from prior imaging to now be 8.5 x 6.3 cm with additional lesions visualized throughout the liver. Patient was evaluated by Dr Cathie Hoops from Oncology who is concerned for malignancy.   Past Medical History:  Diagnosis Date   Anxiety    Aortic atherosclerosis (HCC)    Arthritis    Cardiomyopathy (HCC)    a. 04/2021 Echo: EF 35-40%, glob HK. GrII DD.  Nl RV size/fxn. Mod dil LA. Mild MR.   Carotid arterial disease (HCC)    a. 04/2021 CTA Head/neck: RICA 60, LICA 55.   COPD (chronic obstructive pulmonary disease) (HCC)    Depression    Diabetes mellitus without complication (HCC)    History of kidney stones    Hypertension    Lung cancer (HCC)    Stroke (cerebrum) (HCC)    a. 04/2021 MRI brain: Acute infarct post limb of R internal capsule.   Thoracic aortic aneurysm (HCC)    Tobacco abuse     Past Surgical History:  Procedure Laterality Date   ABDOMINAL HYSTERECTOMY     CHOLECYSTECTOMY     EYE SURGERY Bilateral    IR KYPHO THORACIC WITH BONE BIOPSY  11/24/2021   IR RADIOLOGIST EVAL & MGMT  12/13/2021   KYPHOPLASTY N/A 07/30/2020   Procedure: T10 Kyphoplasty;  Surgeon: Kennedy Bucker, MD;  Location: [facility] ORS;  Service: Orthopedics;  Laterality: N/A;  T10   KYPHOPLASTY N/A 08/19/2020   Procedure: T9 YPHOPLASTY;  Surgeon: Kennedy Bucker, MD;  Location: [facility] ORS;  Service: Orthopedics;  Laterality: N/A;   LOWER EXTREMITY ANGIOGRAPHY Left 08/07/2022   Procedure: Lower  Extremity Angiography;  Surgeon: Annice Needy, MD;  Location: [facility] INVASIVE [facility];  Service: Cardiovascular;  Laterality: Left;    Allergies: Farxiga [dapagliflozin], Sulfa antibiotics, and Iodinated contrast media  Medications: Prior to Admission medications   Medication Sig Start Date End Date Taking? Authorizing Provider  acetaminophen (TYLENOL) 500 MG tablet Take 2 tablets (1,000 mg total) by mouth 3 (three) times daily as needed for mild pain, moderate pain or headache. 05/20/21   Rhetta Mura, MD  ALPRAZolam Prudy Feeler) 0.25 MG tablet Take 1 tablet (0.25 mg total) by mouth daily as needed for anxiety. 08/18/21   Alford Highland, MD  aspirin EC 81 MG EC tablet Take 1 tablet (81 mg total) by mouth daily. Swallow whole. 05/21/21   Rhetta Mura, MD  atorvastatin (LIPITOR) 20 MG tablet Take 20 mg by mouth every Monday, Wednesday, and Friday.    [provider]  carvedilol (COREG) 6.25 MG tablet TAKE 1 TABLET BY MOUTH TWICE A DAY WITH A MEAL 09/11/22   End, Cristal Deer, MD  clopidogrel (PLAVIX) 75 MG tablet Take 1 tablet (75 mg total) by mouth daily. 08/07/22   Annice Needy, MD  cyanocobalamin (VITAMIN B12) 1000 MCG tablet Take 1,000 mcg by mouth daily.    [provider]  doxepin (SINEQUAN) 25 MG capsule Take 50-75 mg by mouth at bedtime. 04/01/20   [provider]  ferrous sulfate 325 (65 FE) MG  tablet Take 1 tablet (325 mg total) by mouth daily. Patient not taking: Reported on 10/25/2022 08/18/21   Alford Highland, MD  furosemide (LASIX) 20 MG tablet TAKE ONE TABLET BY MOUTH DAILY 02/20/22   End, Cristal Deer, MD  gabapentin (NEURONTIN) 300 MG capsule Take 300 mg by mouth at bedtime. 200-300 mg when needed 02/18/20   [provider]  magnesium oxide (MAG-OX) 400 (240 Mg) MG tablet Take 1 tablet (400 mg total) by mouth daily. 08/18/21   Alford Highland, MD  Melatonin 10 MG TABS Take 1 tablet by mouth at bedtime.    [provider]  metFORMIN  (GLUCOPHAGE-XR) 500 MG 24 hr tablet Take 500 mg by mouth 2 (two) times daily. 03/08/20   [provider]  PARoxetine (PAXIL) 30 MG tablet Take 30 mg by mouth every morning. 03/08/20   [provider]     Family History  Problem Relation Age of Onset   Dementia Mother    Congestive Heart Failure Mother    Bladder Cancer Father    Heart disease Father    Breast cancer Neg Hx     Social History   Socioeconomic History   Marital status: Single    Spouse name: Not on file   Number of children: Not on file   Years of education: Not on file   Highest education level: Not on file  Occupational History   Not on file  Tobacco Use   Smoking status: Every Day    Packs/day: 1.00    Years: 68.00    Additional pack years: 0.00    Total pack years: 68.00    Types: Cigarettes   Smokeless tobacco: Never  Vaping Use   Vaping Use: Never used  Substance and Sexual Activity   Alcohol use: Never   Drug use: Never   Sexual activity: Not on file  Other Topics Concern   Not on file  Social History Narrative   Lives locally.  Does not routinely exercise.   Social Determinants of Health   Financial Resource Strain: Not on file  Food Insecurity: Not on file  Transportation Needs: Not on file  Physical Activity: Not on file  Stress: Not on file  Social Connections: Not on file    Code Status: Full code  Review of Systems: A 12 point ROS discussed and pertinent positives are indicated in the HPI above.  All other systems are negative.  Review of Systems  Constitutional:  Negative for chills and fever.  Respiratory:  Positive for shortness of breath. Negative for chest tightness.   Cardiovascular:  Positive for leg swelling. Negative for chest pain.  Gastrointestinal:  Negative for abdominal pain, diarrhea, nausea and vomiting.  Neurological:  Positive for headaches. Negative for dizziness.  Psychiatric/Behavioral:  Negative for confusion.     Vital Signs: BP (!)  144/73   Pulse 94   Temp 98.1 F (36.7 C) (Oral)   Resp 20   Wt 133 lb 9.6 oz (60.6 kg)   SpO2 92%   BMI 22.93 kg/m   Advance Care Plan: The advanced care plan/surrogate decision maker was discussed at the time of visit and documented in the medical record.  Patient indicated that Marissa Fletcher, her sister, is the patient's surrogate decision maker  Physical Exam Vitals reviewed.  Constitutional:      Appearance: She is ill-appearing.  Cardiovascular:     Rate and Rhythm: Normal rate and regular rhythm.     Pulses: Normal pulses.  Heart sounds: Normal heart sounds.  Pulmonary:     Breath sounds: Wheezing present.  Abdominal:     Palpations: Abdomen is soft.     Tenderness: There is no abdominal tenderness.  Musculoskeletal:     Right lower leg: Edema present.     Left lower leg: Edema present.  Skin:    General: Skin is warm and dry.  Neurological:     Mental Status: She is alert and oriented to person, place, and time.  Psychiatric:        Mood and Affect: Mood normal.        Behavior: Behavior normal.        Thought Content: Thought content normal.        Judgment: Judgment normal.     Imaging: MR LIVER W WO CONTRAST  Result Date: 10/11/2022 CLINICAL DATA:  Characterize suspected liver mass identified by prior CT EXAM: MRI ABDOMEN WITHOUT AND WITH CONTRAST TECHNIQUE: Multiplanar multisequence MR imaging of the abdomen was performed both before and after the administration of intravenous contrast. CONTRAST:  6mL GADAVIST GADOBUTROL 1 MMOL/ML IV SOLN COMPARISON:  CT abdomen pelvis, 06/05/2022 FINDINGS: Lower chest: No acute abnormality. Hepatobiliary: Hepatomegaly, maximum coronal span 21.5 cm. Numerous T2 hyperintense, heterogeneously hypoenhancing liver masses, largest centered in the anterior left lobe of the liver, [name], measuring at least 8.5 x 6.3 cm, previously 5.2 x 5.4 cm (series 12, image 7).  Multiple additional lesions throughout the remainder of the liver, which were not appreciated by prior examination and presumably new or enlarged. Status post cholecystectomy. Areas of segmental biliary ductal dilatation, particularly in the left lobe of the liver (series 4, image 17), without common biliary ductal dilatation. Pancreas: Unremarkable. No pancreatic ductal dilatation or surrounding inflammatory changes. Spleen: Normal in size without significant abnormality. Adrenals/Urinary Tract: Adrenal glands are unremarkable. Simple, benign bilateral renal cortical cysts, for which no specific further follow-up or characterization is required. Kidneys are otherwise normal, without obvious renal calculi, solid lesion, or hydronephrosis. Stomach/Bowel: Stomach is within normal limits. No evidence of bowel wall thickening, distention, or inflammatory changes. Vascular/Lymphatic: Severe, irregular mixed calcific atherosclerosis. No enlarged abdominal lymph nodes. Other: No abdominal wall hernia or abnormality. No ascites. Peritoneal nodularity in the left paracolic gutter, assessment limited by breath motion artifact and field of view, although similar to prior (series 4, image 41). Musculoskeletal: No acute or significant osseous findings. IMPRESSION: 1. Numerous hypoenhancing liver masses, largest centered in the anterior left lobe of the liver, [name], measuring at least 8.5 x 6.3 cm, [name], 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, particularly in the left lobe of the liver, without common biliary ductal dilatation. This is likely related to mass effect from adjacent liver masses. 3. Peritoneal nodularity in the left  paracolic gutter, assessment limited by breath motion artifact and field of view, although similar to prior. This is most consistent with peritoneal metastatic disease. 4. No candidate primary malignancy appreciated in the abdomen. 5. Hepatomegaly. Electronically Signed   By: Jearld Lesch M.D.   On: 10/11/2022 17:25    Labs:  CBC: Recent Labs    03/01/22 0600 03/01/22 0604 05/19/22 1259 10/25/22 1130  WBC 6.2  --  7.8 5.6  HGB 10.1* 10.9* 8.8* 11.4*  HCT 33.5* 32.0* 30.9* 37.4  PLT 290  --  367 279    COAGS: Recent Labs    03/01/22 0600 10/25/22 1130  INR 1.0 1.0  APTT  --  27    BMP: Recent Labs    05/19/22 1259 06/09/22 1519 08/07/22 0903 08/21/22 1504 10/25/22 1130  NA 140 137  --  139 136  K 3.0* 4.2  --  3.8 4.1  CL 106 103  --  105 101  CO2 25 27  --  26 27  GLUCOSE 127* 109*  --  111* 110*  BUN 16 12 12 9 10   CALCIUM 8.3* 8.5*  --  9.0 8.4*  CREATININE 0.72 0.59 0.61 0.61 0.39*  GFRNONAA >60 >60 >60 >60 >60    LIVER FUNCTION TESTS: Recent Labs    03/01/22 0600 10/25/22 1130  BILITOT 0.3 0.5  AST 30 23  ALT 24 17  ALKPHOS 114 165*  PROT 6.0* 6.5  ALBUMIN 3.2* 3.0*    TUMOR MARKERS: No results for input(s): "AFPTM", "CEA", "CA199", "CHROMGRNA" in the last 8760 hours.  Assessment and Plan:  Jamarra Dautel is an 82 yo female being seen today in relation to a liver mass. She is under the care of Dr Cathie Hoops from Oncology. Case was approved by Dr Archer Asa for image-guided liver mass biopsy. Patient has been holding their Plavix and aspirin, and is NPO today. They present today in their usual state of health. Pre-procedural labs are pending.  Risks and benefits of image-guided liver biopsy was discussed with the patient and/or patient's family including, but not limited to bleeding, infection, damage to adjacent structures or low yield requiring additional tests.  All of the questions were answered and there is agreement to proceed.  Consent signed and  in chart.   Thank you for this interesting consult.  I greatly enjoyed meeting Marissa Fletcher and look forward to participating in their care.  A copy of this report was sent to the requesting provider on this date.  Electronically Signed: Kennieth Francois, PA-C 11/02/2022, 8:06 AM   I spent a total of  15 Minutes  in face to face in clinical consultation, greater than 50% of which was counseling/coordinating care for liver mass

## 2022-11-02 NOTE — Progress Notes (Signed)
Patient clinically stable post US Liver biopsy per DR El-Abd, tolerated well. Vitals stable pre and post procedure. Received Versed 0.5 mg along with Fentanyl 50 mcg IV for procedure. Report given to Weldon Picking RN post procedure/specials/15.

## 2022-11-02 NOTE — Procedures (Signed)
Interventional Radiology Procedure Note  Date of Procedure: 11/02/2022  Procedure: Liver mass biopsy   Findings:  1. US liver mass biopsy 18ga cores x4    Complications: No immediate complications noted.   Estimated Blood Loss: minimal  Follow-up and Recommendations: 1. Bedrest 2 hours    Olive Bass, MD  Vascular & Interventional Radiology  11/02/2022 9:07 AM

## 2022-11-09 ENCOUNTER — Inpatient Hospital Stay (HOSPITAL_BASED_OUTPATIENT_CLINIC_OR_DEPARTMENT_OTHER): Payer: Medicare Other | Admitting: Oncology

## 2022-11-09 ENCOUNTER — Inpatient Hospital Stay (HOSPITAL_BASED_OUTPATIENT_CLINIC_OR_DEPARTMENT_OTHER): Payer: Medicare Other | Admitting: Hospice and Palliative Medicine

## 2022-11-09 ENCOUNTER — Encounter: Payer: Self-pay | Admitting: Oncology

## 2022-11-09 VITALS — BP 118/72 | HR 89 | Temp 97.3°F | Resp 18 | Wt 132.7 lb

## 2022-11-09 DIAGNOSIS — R160 Hepatomegaly, not elsewhere classified: Secondary | ICD-10-CM

## 2022-11-09 DIAGNOSIS — Z7189 Other specified counseling: Secondary | ICD-10-CM | POA: Diagnosis not present

## 2022-11-09 DIAGNOSIS — Z720 Tobacco use: Secondary | ICD-10-CM

## 2022-11-09 DIAGNOSIS — C3491 Malignant neoplasm of unspecified part of right bronchus or lung: Secondary | ICD-10-CM | POA: Diagnosis not present

## 2022-11-09 NOTE — Assessment & Plan Note (Signed)
Recommend smoke cessation.  

## 2022-11-09 NOTE — Progress Notes (Signed)
Patient not seen. She was reportedly not interested in consult at this time.

## 2022-11-09 NOTE — Assessment & Plan Note (Signed)
Pending above work up 

## 2022-11-09 NOTE — Assessment & Plan Note (Addendum)
AFP is negative, CEA is elevated at 10.  CA 19-9 400s. 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  Awaiting for additional immunohistochemistry. Patient does not have pancreatic lesion on CT, PET scan and MRI. Differential diagnosis includes cholangiocarcinoma, GI, lung origin. Regardless of the etiology, patient's options is limited.  She has multiple comorbidities including cardiomyopathy, COPD, diabetes, stroke, hypertension, arthritis etc.  Her performance status is poor.  Most likely not able to tolerate aggressive chemotherapy. She strongly desires treatments, consider sending off NGS/molecular study, and if no cardiac mutation, consider single dose reduced chemotherapy agent Recommend patient establish care with palliative care service.  She declined. She is undecided today and will update my office regarding her preference.

## 2022-11-09 NOTE — Progress Notes (Signed)
Hematology/Oncology Consult Note [phone] [phone]     REFERRING PROVIDER: Dorothey Baseman, MD    CHIEF COMPLAINTS/PURPOSE OF CONSULTATION:  Liver mass  ASSESSMENT & PLAN:   Liver mass AFP is negative, CEA is elevated at 10.  CA 19-9 400s. 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  Awaiting for additional immunohistochemistry. Patient does not have pancreatic lesion on CT, PET scan and MRI. Differential diagnosis includes cholangiocarcinoma, GI, lung origin. Regardless of the etiology, patient's options is limited.  She has multiple comorbidities including cardiomyopathy, COPD, diabetes, stroke, hypertension, arthritis etc.  Her performance status is poor.  Most likely not able to tolerate aggressive chemotherapy. She strongly desires treatments, consider sending off NGS/molecular study, and if no cardiac mutation, consider single dose reduced chemotherapy agent Recommend patient establish care with palliative care service.  She declined. She is undecided today and will update my office regarding her preference.  Primary adenocarcinoma of right lung [facility]) Pending above workup.  Tobacco abuse Recommend smoke cessation.  Goals of care, counseling/discussion Discussed with patient.  She is aware that her condition is not curable Palliative intent.   No orders of the defined types were placed in this encounter.  Follow-up to be determined. All questions were answered. The patient knows to call the clinic with any problems, questions or concerns.  Rickard Patience, MD, PhD [facility] Hematology Oncology 11/09/2022    HISTORY OF PRESENTING ILLNESS:  Marissa Fletcher 82 y.o. female presents to establish care for liver mass.  I have reviewed her chart and materials related to her cancer extensively and collaborated history with the patient. Summary of oncologic history is as follows: Oncology  History  Primary adenocarcinoma of right lung (HCC)  08/17/2020 Initial Diagnosis   Primary adenocarcinoma of right lung (HCC)  05/10/2020-05/12/2020, [name], indeterminate right hilar and precarinal borderline enlarged lymphadenopathy. Hepatomegaly.  Sigmoid diverticulosis with no diverticulitis.  Descending thoracic aorta aneurysm 4 cm.  Extensive calcified and noncalcified atherosclerotic plaque of the thoracic and abdominal aorta. 07/13/2020, [name], nonspecific metabolic activity. Nonspecific diffuse colonic activity. Thoracic and abdominal aortic aneurysm  07/30/2020, T10 kyphoplasty. T10 biopsy negative for malignancy.   09/13/20 -09/27/2020 lung radiation.      08/17/2020 Cancer Staging   Staging form: Lung, AJCC 8th Edition - Clinical stage from 08/17/2020: Stage IA2 (cT1b, [name], [name], MD on 08/17/2020    She did not follow-up with oncology after her radiation. 05/19/2022, CT chest without contrast showed development of 5.3 cm mass in the left hepatic lobe not well-characterized. 06/05/2022, CT angio abdomen/pelvis with and without contrast again showed new 40 defined low-density lesion involving the left hepatic lobe.  Patient was referred to see GI and patient did not get an appointment until May 2024. Patient was seen by Dr. Servando Snare and was recommended to get MRI liver with and without contrast.  10/10/2022, MRI liver with and without contrast showed 1. Numerous hypoenhancing liver masses, largest centered in the anterior left  lobe of the liver, [name],  measuring at least 8.5 x 6.3 cm, [name], 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, particularly in the left lobe of the liver, [name], assessment limited by breath motion artifact and field of view, although similar to prior. This is most consistent with peritoneal metastatic disease. 4. No candidate primary malignancy appreciated in the abdomen. 5. Hepatomegaly  INTERVAL HISTORY Marissa Fletcher is a 82 y.o. female who has above history reviewed by me today presents for follow up visit for liver mass Patient reports fatigue.  Appetite is fair.  She denies any pain.  Denies any unintentional weight loss.  Denies any blood in the stool or dark stool.  Denies shortness of breath more than her baseline. She was accompanied by her Sister Marissa Fletcher. She is status post ultrasound-guided liver lesion biopsy.  MEDICAL HISTORY:  Past Medical History:  Diagnosis Date   Anxiety    Aortic atherosclerosis (HCC)    Arthritis    Cardiomyopathy (HCC)    a. 04/2021 Echo: EF 35-40%, glob HK. GrII DD.  Nl RV size/fxn. Mod dil LA. Mild MR.   Carotid arterial disease (HCC)    a. 04/2021 CTA Head/neck: RICA 60, LICA 55.   COPD (chronic obstructive pulmonary disease) (HCC)    Depression    Diabetes mellitus without complication (HCC)    History of kidney stones    Hypertension    Lung cancer (HCC)    Stroke (cerebrum) (HCC)    a. 04/2021 MRI brain: Acute infarct post limb of R internal capsule.   Thoracic aortic aneurysm (HCC)    Tobacco  abuse     SURGICAL HISTORY: Past Surgical History:  Procedure Laterality Date   ABDOMINAL HYSTERECTOMY     CHOLECYSTECTOMY     EYE SURGERY Bilateral    IR KYPHO THORACIC WITH BONE BIOPSY  11/24/2021   IR RADIOLOGIST EVAL & MGMT  12/13/2021   KYPHOPLASTY N/A 07/30/2020   Procedure: T10 Kyphoplasty;  Surgeon: Kennedy Bucker, MD;  Location: [facility] ORS;  Service: Orthopedics;  Laterality: N/A;  T10   KYPHOPLASTY N/A 08/19/2020   Procedure: T9 YPHOPLASTY;  Surgeon: Kennedy Bucker, MD;  Location: [facility] ORS;  Service: Orthopedics;  Laterality: N/A;   LOWER EXTREMITY ANGIOGRAPHY Left 08/07/2022   Procedure: Lower Extremity Angiography;  Surgeon: Annice Needy, MD;  Location: [facility] INVASIVE CV LAB;  Service: Cardiovascular;  Laterality: Left;    SOCIAL HISTORY: Social History   Socioeconomic History   Marital status: Single    Spouse name: Not on file   Number of children: 2   Years of education: Not on file   Highest education level: Not on file  Occupational History   Not on file  Tobacco Use   Smoking status: Every Day    Packs/day: 1.00    Years: 68.00    Additional pack years: 0.00    Total pack years: 68.00    Types: Cigarettes   Smokeless tobacco: Never  Vaping Use   Vaping Use: Never used  Substance and Sexual Activity   Alcohol use: Never   Drug use: Never   Sexual activity: Not on file  Other Topics Concern   Not on file  Social History Narrative  Lives locally by herself: sister lives close to pt.  Does not routinely exercise.   Social Determinants of Health   Financial Resource Strain: Not on file  Food Insecurity: Not on file  Transportation Needs: Not on file  Physical Activity: Not on file  Stress: Not on file  Social Connections: Not on file  Intimate Partner Violence: Not on file    FAMILY HISTORY: Family History  Problem Relation Age of Onset   Dementia Mother    Congestive Heart Failure Mother    Bladder Cancer Father    Heart disease Father    Breast  cancer Neg Hx     ALLERGIES:  is allergic to farxiga [dapagliflozin], sulfa antibiotics, and iodinated contrast media.  MEDICATIONS:  Current Outpatient Medications  Medication Sig Dispense Refill   ALPRAZolam (XANAX) 0.25 MG tablet Take 1 tablet (0.25 mg total) by mouth daily as needed for anxiety. 10 tablet 0   aspirin EC 81 MG EC tablet Take 1 tablet (81 mg total) by mouth daily. Swallow whole. 30 tablet 11   atorvastatin (LIPITOR) 20 MG tablet Take 20 mg by mouth every Monday, Wednesday, and Friday.     carvedilol (COREG) 6.25 MG tablet TAKE 1 TABLET BY MOUTH TWICE A DAY WITH A MEAL 60 tablet 2   clopidogrel (PLAVIX) 75 MG tablet Take 1 tablet (75 mg total) by mouth daily. 30 tablet 12   cyanocobalamin (VITAMIN B12) 1000 MCG tablet Take 1,000 mcg by mouth daily.     doxepin (SINEQUAN) 25 MG capsule Take 50-75 mg by mouth at bedtime.     furosemide (LASIX) 20 MG tablet TAKE ONE TABLET BY MOUTH DAILY 30 tablet 4   gabapentin (NEURONTIN) 300 MG capsule Take 300 mg by mouth at bedtime. 200-300 mg when needed     Melatonin 10 MG TABS Take 1 tablet by mouth at bedtime.     metFORMIN (GLUCOPHAGE-XR) 500 MG 24 hr tablet Take 500 mg by mouth 2 (two) times daily.     PARoxetine (PAXIL) 30 MG tablet Take 30 mg by mouth every morning.     acetaminophen (TYLENOL) 500 MG tablet Take 2 tablets (1,000 mg total) by mouth 3 (three) times daily as needed for mild pain, moderate pain or headache. (Patient not taking: Reported on 11/09/2022) 30 tablet 0   ferrous sulfate 325 (65 FE) MG tablet Take 1 tablet (325 mg total) by mouth daily. (Patient not taking: Reported on 11/09/2022) 30 tablet 3   magnesium oxide (MAG-OX) 400 (240 Mg) MG tablet Take 1 tablet (400 mg total) by mouth daily. (Patient not taking: Reported on 11/09/2022) 30 tablet 0   No current facility-administered medications for this visit.    Review of Systems  Constitutional:  Positive for fatigue. Negative for appetite change, chills and  fever.  HENT:   Negative for hearing loss and voice change.   Eyes:  Negative for eye problems.  Respiratory:  Negative for chest tightness and cough.   Cardiovascular:  Negative for chest pain.  Gastrointestinal:  Negative for abdominal distention, abdominal pain and blood in stool.       Hepatomegaly  Endocrine: Negative for hot flashes.  Genitourinary:  Negative for difficulty urinating and frequency.   Musculoskeletal:  Negative for arthralgias.       Scoliosis  Skin:  Negative for itching and rash.  Neurological:  Negative for extremity weakness.  Hematological:  Negative for adenopathy.  Psychiatric/Behavioral:  Negative for confusion.      PHYSICAL EXAMINATION: ECOG  PERFORMANCE STATUS: 2 - Symptomatic, <50% confined to bed  Vitals:   11/09/22 1033  BP: 118/72  Pulse: 89  Resp: 18  Temp: (!) 97.3 F (36.3 C)   Filed Weights   11/09/22 1033  Weight: 132 lb 11.2 oz (60.2 kg)    Physical Exam Constitutional:      General: She is not in acute distress.    Appearance: She is obese. She is not diaphoretic.     Comments: Frail appearance female sitting in the wheelchair  HENT:     Head: Normocephalic and atraumatic.  Eyes:     General: No scleral icterus.    Pupils: Pupils are equal, round, and reactive to light.  Cardiovascular:     Rate and Rhythm: Normal rate and regular rhythm.     Heart sounds: No murmur heard. Pulmonary:     Effort: Pulmonary effort is normal. No respiratory distress.  Abdominal:     General: Bowel sounds are normal. There is no distension.     Palpations: Abdomen is soft.  Musculoskeletal:        General: Normal range of motion.     Cervical back: Normal range of motion and neck supple.  Skin:    General: Skin is warm and dry.     Findings: No erythema.  Neurological:     Mental Status: She is alert and oriented to person, place, and time. Mental status is at baseline.     Cranial Nerves: No cranial nerve deficit.     Motor: No  abnormal muscle tone.  Psychiatric:        Mood and Affect: Mood and affect normal.      LABORATORY DATA:  I have reviewed the data as listed    Latest Ref Rng & Units 11/02/2022    7:40 AM 10/25/2022   11:30 AM 05/19/2022   12:59 PM  CBC  WBC 4.0 - 10.5 K/uL 6.5  5.6  7.8   Hemoglobin 12.0 - 15.0 g/dL 16.1  09.6  8.8   Hematocrit 36.0 - 46.0 % 39.7  37.4  30.9   Platelets 150 - 400 K/uL 298  279  367       Latest Ref Rng & Units 11/02/2022    7:40 AM 10/25/2022   11:30 AM 08/21/2022    3:04 PM  CMP  Glucose 70 - 99 mg/dL 045  409  811   BUN 8 - 23 mg/dL 14  10  9    Creatinine 0.44 - 1.00 mg/dL 9.14  7.82  9.56   Sodium 135 - 145 mmol/L 137  136  139   Potassium 3.5 - 5.1 mmol/L 4.3  4.1  3.8   Chloride 98 - 111 mmol/L 102  101  105   CO2 22 - 32 mmol/L 26  27  26    Calcium 8.9 - 10.3 mg/dL 8.8  8.4  9.0   Total Protein 6.5 - 8.1 g/dL 6.9  6.5    Total Bilirubin 0.3 - 1.2 mg/dL 0.3  0.5    Alkaline Phos 38 - 126 U/L 173  165    AST 15 - 41 U/L 26  23    ALT 0 - 44 U/L 15  17       RADIOGRAPHIC STUDIES: I have personally reviewed the radiological images as listed and agreed with the findings in the report. US BIOPSY (LIVER)  Result Date: 11/02/2022 INDICATION: Liver mass EXAM: Ultrasound-guided core needle biopsy of focal liver lesion MEDICATIONS: None. ANESTHESIA/SEDATION:  Moderate (conscious) sedation was employed during this procedure. A total of Versed 0.5 mg and Fentanyl 50 mcg was administered intravenously by the radiology nurse. Total intra-service moderate Sedation Time: 14 minutes. The patient's level of consciousness and vital signs were monitored continuously by radiology nursing throughout the procedure under my direct supervision. COMPLICATIONS: None immediate. PROCEDURE: Informed written consent was obtained from the patient after a thorough discussion of the procedural risks, benefits and alternatives. All questions were addressed. Maximal Sterile Barrier Technique  was utilized including caps, mask, sterile gowns, sterile gloves, sterile drape, hand hygiene and skin antiseptic. A timeout was performed prior to the initiation of the procedure. The patient was placed supine on the exam table. Ultrasound of the liver demonstrated large heterogeneous lesion within the central portion of the liver. Skin entry site was marked, and the overlying skin was prepped draped in the standard sterile fashion. Local analgesia was obtained with 1% lidocaine. Using ultrasound guidance, a 17 gauge introducer needle was advanced just deep to the identified lesion. Subsequently, core needle biopsy was performed of the large liver lesion using an 18 gauge core biopsy device x4 total passes. Specimens were submitted in formalin to pathology for further handling. Limited postprocedure imaging demonstrated no hematoma. A clean dressing was placed after manual hemostasis. The patient tolerated the procedure well without immediate complication. IMPRESSION: Successful ultrasound-guided core needle biopsy of large central liver lesion. Electronically Signed   By: Olive Bass M.D.   On: 11/02/2022 16:06   NM PET Image Initial (PI) Skull Base To Thigh  Result Date: 11/02/2022 CLINICAL DATA:  Subsequent treatment strategy for liver mass. History of lung cancer diagnosed 2022 EXAM: NUCLEAR MEDICINE PET SKULL BASE TO THIGH TECHNIQUE: 7.45 mCi F-18 FDG was injected intravenously. Full-ring PET imaging was performed from the skull base to thigh after the radiotracer. CT data was obtained and used for attenuation correction and anatomic localization. Fasting blood glucose: 99 mg/dl COMPARISON:  MRI 16/02/9603 FINDINGS: Mediastinal blood pool activity: SUV max 1.7 Liver activity: SUV max NA NECK: No hypermetabolic lymph nodes in the neck. Incidental CT findings: None. CHEST: No hypermetabolic mediastinal or hilar nodes. No suspicious pulmonary nodules on the CT scan. Incidental CT findings: Atelectasis in  the lingula without metabolic activity. ABDOMEN/PELVIS: Confluent intensely hypermetabolic lesions within the LEFT hepatic lobe involving all segments. Additional smaller lesions in the RIGHT hepatic lobe. Confluency conglomerate LEFT hepatic lobe mass with SUV max equal 8.7. This corresponds to hypoenhancing mass on comparison MRI. No hypermetabolic abdominal lymph nodes. Physiologic activity within the bowel. Incidental CT findings: None. SKELETON: Metabolic activity associated with 2 LEFT ribs. Nondisplaced fracture associated with the LEFT lateral lateral metabolic activity on image 39. There is periosteal reaction associated more posterior lesion on image 42. Incidental CT findings: None. IMPRESSION: 1. 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 2. Two metabolic lesions in posterior LEFT ribs with differential including pathologic fractures versus traumatic fracture. Recommend clinical correlation. Electronically Signed   By: Genevive Bi M.D.   On: 11/02/2022 08:26

## 2022-11-09 NOTE — Assessment & Plan Note (Signed)
Discussed with patient.  She is aware that her condition is not curable Palliative intent.

## 2022-11-13 ENCOUNTER — Telehealth: Payer: Self-pay | Admitting: *Deleted

## 2022-11-13 ENCOUNTER — Encounter: Payer: Self-pay | Admitting: Oncology

## 2022-11-13 NOTE — Telephone Encounter (Signed)
Message from [facility] stating she has a called report for biopsy done, I got voice mail when I called her back.  I left message to please call me back

## 2022-11-13 NOTE — Telephone Encounter (Signed)
I spoke with [facility] and they report that BIOPSY shows positive for CK and Metastatic Cancer. She will fax report over

## 2022-11-15 ENCOUNTER — Encounter: Payer: Self-pay | Admitting: Oncology

## 2022-11-17 ENCOUNTER — Other Ambulatory Visit: Payer: Self-pay

## 2022-11-17 ENCOUNTER — Inpatient Hospital Stay (HOSPITAL_BASED_OUTPATIENT_CLINIC_OR_DEPARTMENT_OTHER): Payer: Medicare Other | Admitting: Oncology

## 2022-11-17 ENCOUNTER — Encounter: Payer: Self-pay | Admitting: Hospice and Palliative Medicine

## 2022-11-17 ENCOUNTER — Encounter (INDEPENDENT_AMBULATORY_CARE_PROVIDER_SITE_OTHER): Payer: Self-pay | Admitting: Vascular Surgery

## 2022-11-17 ENCOUNTER — Encounter: Payer: Self-pay | Admitting: Internal Medicine

## 2022-11-17 ENCOUNTER — Encounter: Payer: Self-pay | Admitting: Oncology

## 2022-11-17 ENCOUNTER — Inpatient Hospital Stay (HOSPITAL_BASED_OUTPATIENT_CLINIC_OR_DEPARTMENT_OTHER): Payer: Medicare Other | Admitting: Hospice and Palliative Medicine

## 2022-11-17 VITALS — BP 143/73 | HR 97 | Temp 97.2°F | Resp 22

## 2022-11-17 VITALS — BP 143/73 | HR 97 | Temp 97.2°F | Resp 22 | Ht 64.0 in | Wt 133.0 lb

## 2022-11-17 DIAGNOSIS — C221 Intrahepatic bile duct carcinoma: Secondary | ICD-10-CM | POA: Diagnosis not present

## 2022-11-17 DIAGNOSIS — Z7189 Other specified counseling: Secondary | ICD-10-CM

## 2022-11-17 DIAGNOSIS — S81801A Unspecified open wound, right lower leg, initial encounter: Secondary | ICD-10-CM

## 2022-11-17 DIAGNOSIS — R160 Hepatomegaly, not elsewhere classified: Secondary | ICD-10-CM

## 2022-11-17 MED ORDER — ALPRAZOLAM 0.25 MG PO TABS: 0.2500 mg | ORAL_TABLET | Freq: Every day | ORAL | 0 refills | Status: DC | PRN

## 2022-11-17 MED ORDER — CEPHALEXIN 500 MG PO CAPS: 500.0000 mg | ORAL_CAPSULE | Freq: Two times a day (BID) | ORAL | 0 refills | Status: DC

## 2022-11-17 NOTE — Progress Notes (Signed)
Palliative Medicine [facility] at [facility] [phone] [phone]) [phone]   Name: Marissa Fletcher Date: 11/17/2022 MRN: [id number]  DOB: 1941/04/25  Patient Care Team: Dorothey Baseman, MD as PCP - General (Family Medicine) End, Cristal Deer, MD as PCP - Cardiology (Cardiology) Glory Buff, RN as Oncology Nurse Navigator Carmina Miller, MD as Referring Physician (Radiation Oncology) Rickard Patience, MD as Consulting Physician (Oncology)    REASON FOR CONSULTATION: Marissa Fletcher is a 82 y.o. 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  Palliative care was consulted to address goals and manage ongoing symptoms.   SOCIAL HISTORY:     reports that she has been smoking cigarettes. She has a 68.00 pack-year smoking history. She has never used smokeless tobacco. She reports that she does not drink alcohol and does not use drugs.  Patient is widowed.  She lives at home alone.  She has 2 sons who are now deceased.  She has a granddaughter who lives in [place].  Patient has a sister nearby who is involved in her care.  ADVANCE DIRECTIVES:  Patient's sister is her healthcare power of attorney.  CODE STATUS: DNR/DNI (DNR order signed on 11/09/2022)  PAST MEDICAL HISTORY: Past Medical History:  Diagnosis Date   Anxiety    Aortic atherosclerosis (HCC)    Arthritis    Cardiomyopathy (HCC)    a. 04/2021 Echo: EF 35-40%, glob HK. GrII DD.  Nl RV size/fxn. Mod dil LA. Mild MR.   Carotid arterial disease (HCC)    a. 04/2021 CTA Head/neck: RICA 60, LICA 55.   COPD (chronic obstructive pulmonary disease) (HCC)    Depression    Diabetes mellitus without complication (HCC)    History of kidney stones    Hypertension    Lung cancer (HCC)    Stroke (cerebrum) (HCC)    a. 04/2021 MRI brain: Acute infarct post limb of R  internal capsule.   Thoracic aortic aneurysm (HCC)    Tobacco abuse     PAST SURGICAL HISTORY:  Past Surgical History:  Procedure Laterality Date   ABDOMINAL HYSTERECTOMY     CHOLECYSTECTOMY     EYE SURGERY Bilateral    IR KYPHO THORACIC WITH BONE BIOPSY  11/24/2021   IR RADIOLOGIST EVAL & MGMT  12/13/2021   KYPHOPLASTY N/A 07/30/2020   Procedure: T10 Kyphoplasty;  Surgeon: Kennedy Bucker, MD;  Location: [facility] ORS;  Service: Orthopedics;  Laterality: N/A;  T10   KYPHOPLASTY N/A 08/19/2020   Procedure: T9 YPHOPLASTY;  Surgeon: Kennedy Bucker, MD;  Location: [facility] ORS;  Service: Orthopedics;  Laterality: N/A;   LOWER EXTREMITY ANGIOGRAPHY Left 08/07/2022   Procedure: Lower Extremity Angiography;  Surgeon: Annice Needy, MD;  Location: [facility] INVASIVE CV LAB;  Service: Cardiovascular;  Laterality: Left;    HEMATOLOGY/ONCOLOGY HISTORY:  Oncology History  Primary adenocarcinoma of right lung (HCC)  08/17/2020 Initial Diagnosis   Primary adenocarcinoma of right lung (HCC)  05/10/2020-05/12/2020, [name], indeterminate right hilar and precarinal borderline enlarged lymphadenopathy. Hepatomegaly.  Sigmoid diverticulosis with no diverticulitis.  Descending thoracic aorta aneurysm 4 cm.  Extensive calcified and noncalcified atherosclerotic plaque of the thoracic and abdominal aorta. 07/13/2020, PET scan showed marked hypermetabolic him associated with the right upper lobe  nodule compatible with bronchogenic neoplasm. No adenopathy or distant metastasis disease. Small nonspecific pulmonary nodules without changes or increased metabolic activity. Compression fracture of the T10, nonspecific metabolic activity. Nonspecific diffuse colonic activity. Thoracic and abdominal aortic aneurysm  07/30/2020, T10 kyphoplasty. T10 biopsy negative for  malignancy.   09/13/20 -09/27/2020 lung radiation.      08/17/2020 Cancer Staging   Staging form: Lung, AJCC 8th Edition - Clinical stage from 08/17/2020: Stage IA2 (cT1b, [name], [name], MD on 08/17/2020     ALLERGIES:  is allergic to farxiga [dapagliflozin], sulfa antibiotics, and iodinated contrast media.  MEDICATIONS:  Current Outpatient Medications  Medication Sig Dispense Refill   ALPRAZolam (XANAX) 0.25 MG tablet Take 1 tablet (0.25 mg total) by mouth daily as needed for anxiety. 10 tablet 0   aspirin EC 81 MG EC tablet Take 1 tablet (81 mg total) by mouth daily. Swallow whole. 30 tablet 11   atorvastatin (LIPITOR) 20 MG tablet Take 20 mg by mouth every Monday, Wednesday, and Friday.     carvedilol (COREG) 6.25 MG tablet TAKE 1 TABLET BY MOUTH TWICE A DAY WITH A MEAL 60 tablet 2   clopidogrel (PLAVIX) 75 MG tablet Take 1 tablet (75 mg total) by mouth daily. 30 tablet 12   cyanocobalamin (VITAMIN B12) 1000 MCG tablet Take 1,000 mcg by mouth daily.     doxepin (SINEQUAN) 25 MG capsule Take 50-75 mg by mouth at bedtime.     furosemide (LASIX) 20 MG tablet TAKE ONE TABLET BY MOUTH DAILY (Patient taking differently: Take 20 mg by mouth 3 (three) times a week.) 30 tablet 4   gabapentin (NEURONTIN) 300 MG capsule Take 300 mg by mouth at bedtime. 200-300 mg when needed     Ibuprofen (ADVIL) 200 MG CAPS Take 400 mg by mouth in the morning and at bedtime.     Melatonin 10 MG TABS Take 1 tablet by mouth at bedtime.     metFORMIN (GLUCOPHAGE-XR) 500 MG 24 hr tablet Take 500 mg by mouth 2 (two) times daily.     PARoxetine (PAXIL) 30 MG tablet Take 30 mg by mouth every morning.     acetaminophen (TYLENOL) 500 MG tablet Take 2 tablets (1,000 mg total) by mouth 3 (three) times daily as needed for mild pain, moderate pain or headache. (Patient not taking: Reported on 11/09/2022) 30 tablet 0   No current facility-administered medications for this visit.    VITAL SIGNS: BP (!) 143/73    Pulse 97   Temp (!) 97.2 F (36.2 C) (Tympanic)   Resp (!) 22   Ht 5\' 4"  (1.626 m)   Wt 133 lb (60.3 kg)   BMI 22.83 kg/m  Filed Weights   11/17/22 1054  Weight: 133 lb (60.3 kg)    Estimated body mass index is 22.83 kg/m as calculated from the following:   Height as of this encounter: 5\' 4"  (1.626 m).   Weight as of this encounter: 133 lb (60.3 kg).  LABS: CBC:    Component Value Date/Time   WBC 6.5 11/02/2022 0740   HGB 11.8 (L) 11/02/2022 0740   HGB 11.4 (L) 10/25/2022 1130   HCT 39.7 11/02/2022 0740   PLT 298 11/02/2022 0740   PLT 279 10/25/2022 1130   MCV 83.2 11/02/2022 0740   NEUTROABS 4.6 11/02/2022 0740   LYMPHSABS 1.2 11/02/2022 0740   MONOABS 0.6 11/02/2022 0740   EOSABS 0.1 11/02/2022 0740   BASOSABS 0.0 11/02/2022 0740   Comprehensive  Metabolic Panel:    Component Value Date/Time   NA 137 11/02/2022 0740   NA 141 06/27/2021 1017   K 4.3 11/02/2022 0740   CL 102 11/02/2022 0740   CO2 26 11/02/2022 0740   BUN 14 11/02/2022 0740   BUN 9 06/27/2021 1017   CREATININE 0.52 11/02/2022 0740   CREATININE 0.39 (L) 10/25/2022 1130   GLUCOSE 132 (H) 11/02/2022 0740   CALCIUM 8.8 (L) 11/02/2022 0740   AST 26 11/02/2022 0740   AST 23 10/25/2022 1130   ALT 15 11/02/2022 0740   ALT 17 10/25/2022 1130   ALKPHOS 173 (H) 11/02/2022 0740   BILITOT 0.3 11/02/2022 0740   BILITOT 0.5 10/25/2022 1130   PROT 6.9 11/02/2022 0740   ALBUMIN 3.1 (L) 11/02/2022 0740    RADIOGRAPHIC STUDIES: US BIOPSY (LIVER)  Result Date: 11/02/2022 INDICATION: Liver mass EXAM: Ultrasound-guided core needle biopsy of focal liver lesion MEDICATIONS: None. ANESTHESIA/SEDATION: Moderate (conscious) sedation was employed during this procedure. A total of Versed 0.5 mg and Fentanyl 50 mcg was administered intravenously by the radiology nurse. Total intra-service moderate Sedation Time: 14 minutes. The patient's level of consciousness and vital signs were monitored continuously by radiology  nursing throughout the procedure under my direct supervision. COMPLICATIONS: None immediate. PROCEDURE: Informed written consent was obtained from the patient after a thorough discussion of the procedural risks, benefits and alternatives. All questions were addressed. Maximal Sterile Barrier Technique was utilized including caps, mask, sterile gowns, sterile gloves, sterile drape, hand hygiene and skin antiseptic. A timeout was performed prior to the initiation of the procedure. The patient was placed supine on the exam table. Ultrasound of the liver demonstrated large heterogeneous lesion within the central portion of the liver. Skin entry site was marked, and the overlying skin was prepped draped in the standard sterile fashion. Local analgesia was obtained with 1% lidocaine. Using ultrasound guidance, a 17 gauge introducer needle was advanced just deep to the identified lesion. Subsequently, core needle biopsy was performed of the large liver lesion using an 18 gauge core biopsy device x4 total passes. Specimens were submitted in formalin to pathology for further handling. Limited postprocedure imaging demonstrated no hematoma. A clean dressing was placed after manual hemostasis. The patient tolerated the procedure well without immediate complication. IMPRESSION: Successful ultrasound-guided core needle biopsy of large central liver lesion. Electronically Signed   By: Olive Bass M.D.   On: 11/02/2022 16:06   NM PET Image Initial (PI) Skull Base To Thigh  Result Date: 11/02/2022 CLINICAL DATA:  Subsequent treatment strategy for liver mass. History of lung cancer diagnosed 2022 EXAM: NUCLEAR MEDICINE PET SKULL BASE TO THIGH TECHNIQUE: 7.45 mCi F-18 FDG was injected intravenously. Full-ring PET imaging was performed from the skull base to thigh after the radiotracer. CT data was obtained and used for attenuation correction and anatomic localization. Fasting blood glucose: 99 mg/dl COMPARISON:  MRI 16/02/9603  FINDINGS: Mediastinal blood pool activity: SUV max 1.7 Liver activity: SUV max NA NECK: No hypermetabolic lymph nodes in the neck. Incidental CT findings: None. CHEST: No hypermetabolic mediastinal or hilar nodes. No suspicious pulmonary nodules on the CT scan. Incidental CT findings: Atelectasis in the lingula without metabolic activity. ABDOMEN/PELVIS: Confluent intensely hypermetabolic lesions within the LEFT hepatic lobe involving all segments. Additional smaller lesions in the RIGHT hepatic lobe. Confluency conglomerate LEFT hepatic lobe mass with SUV max equal 8.7. This corresponds to hypoenhancing mass on comparison MRI. No hypermetabolic abdominal lymph nodes. Physiologic activity within the bowel. Incidental CT findings:  None. SKELETON: Metabolic activity associated with 2 LEFT ribs. Nondisplaced fracture associated with the LEFT lateral lateral metabolic activity on image 39. There is periosteal reaction associated more posterior lesion on image 42. Incidental CT findings: None. IMPRESSION: 1. 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 2. Two metabolic lesions in posterior LEFT ribs with differential including pathologic fractures versus traumatic fracture. Recommend clinical correlation. Electronically Signed   By: Genevive Bi M.D.   On: 11/02/2022 08:26    PERFORMANCE STATUS (ECOG) : 2-3  Review of Systems Unless otherwise noted, a complete review of systems is negative.  Physical Exam General: Frail appearing, in wheelchair Pulmonary: Unlabored Abdomen: soft, nontender, + bowel sounds GU: no suprapubic tenderness Extremities: no edema, no joint deformities Skin: no rashes Neurological: Weakness but otherwise nonfocal  IMPRESSION: Patient with liver and pancreatic mass with pathology with CK7 positive adenocarcinoma.  Patient states that she is aware that her cancer portends a poor prognosis.  She plans to  discuss with her granddaughter this weekend.  Patient undecided on whether she would want to seek cancer treatment.  We discussed importance of balancing quality of life with quantity of life.  At present, patient lives at home alone and is functionally independent with her care.  However, she has multiple comorbidities and is overall quite frail appearing.  She says that she "knows the fire department well" given the frequency with which she has to call them for assistance at home.  Patient sees Dr. Cathie Hoops today for further discussion of treatment options.  Should patient opt not to pursue treatment or is felt not to be a viable candidate, would highly recommend home support with hospice.  Patient says that she has completed ACP documents.  Her sister is her healthcare power of attorney.  We discussed CODE STATUS today.  Patient stated clearly and repeatedly that she would not want to be resuscitated or have her life prolonged artificially on machines.  She is in agreement with DNR/DNI.  I signed a DNR order for her to take home today.  Symptomatically, patient has a nonhealing wound on the right shin following a fall several weeks ago.  Right lower extremity and periwound area is somewhat erythematous.  Wound is covered in slough.  Will refer to the wound center.  Will start patient on short course of Keflex.  Patient endorses anxiety that has been worse since her diagnosis of cancer.  She has previously taken alprazolam with good effect and requests a refill today.  PDMP reviewed.  PLAN: -Continue current scope of treatment -Refill alprazolam -Start cephalexin 500 mg twice daily x 7 days -Referral to wound center -DNR/DNI -Follow-up telephone visit 3 to 4 weeks  Addendum: Patient met with Dr. Cathie Hoops and decided to pursue hospice  Patient expressed understanding and was in agreement with this plan. She also understands that She can call the clinic at any time with any questions, concerns, or  complaints.     Time Total: 25 minutes  Visit consisted of counseling and education dealing with the complex and emotionally intense issues of symptom management and palliative care in the setting of serious and potentially life-threatening illness.Greater than 50%  of this time was spent counseling and coordinating care related to the above assessment and plan.  Signed by: Laurette Schimke, PhD, NP-C

## 2022-11-18 NOTE — Progress Notes (Signed)
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  CA 19-9 400s. Pathology showed CK7 positive for adenocarcinoma, negative for CK20  Patient does not have pancreatic lesion on CT, PET scan and MRI. 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  Differential diagnosis includes upper GI origin. Regardless of the etiology, patient's options is limited.  She has multiple comorbidities including cardiomyopathy, COPD, diabetes, stroke, hypertension, arthritis etc.  Her performance status is poor.  Most likely not able to tolerate aggressive chemotherapy. Patient understands that her prognosis is poor and prefers not to take chemotherapy treatments. She is interested in hospice/comfort care and will meet palliative care service today.   Goals of care, counseling/discussion Discussed with patient.  She is aware that her condition is not curable Palliative intent.  We spent sufficient time to discuss many aspect of care, questions were answered to patient's satisfaction. A total of 40 minutes was spent on this visit.  With 10 minutes spent reviewing image findings, pathology reports, 25 minutes counseling the patient on the diagnosis, goal of care, chemotherapy treatments, side effects of the treatment.  Additional 5 minutes was spent on answering patient's questions.  All questions were answered. The patient knows to call the clinic with any problems, questions or concerns.  Rickard Patience, MD, PhD [facility] Hematology Oncology 11/17/2022    HISTORY OF PRESENTING ILLNESS:  Marissa Fletcher 82 y.o. female presents to establish care for adenocarcinoma. I have reviewed her chart and materials related to her cancer extensively and collaborated history with the patient. Summary of  oncologic history is as follows: Oncology History  Primary adenocarcinoma of right lung (HCC)  08/17/2020 Initial Diagnosis   Primary adenocarcinoma of right lung (HCC)  05/10/2020-05/12/2020, [name], indeterminate right hilar and precarinal borderline enlarged lymphadenopathy. Hepatomegaly.  Sigmoid diverticulosis with no diverticulitis.  Descending thoracic aorta aneurysm 4 cm.  Extensive calcified and noncalcified atherosclerotic plaque of the thoracic and abdominal aorta. 07/13/2020, [name], nonspecific metabolic activity. Nonspecific diffuse colonic activity. Thoracic and abdominal aortic aneurysm  07/30/2020, T10 kyphoplasty. T10 biopsy negative for malignancy.   09/13/20 -09/27/2020 lung radiation.      08/17/2020 Cancer Staging   Staging form: Lung, AJCC 8th Edition - Clinical stage from 08/17/2020: Stage IA2 (cT1b, [name], [name], MD on 08/17/2020     INTERVAL HISTORY Marissa Fletcher is a 82 y.o. female who has above history reviewed by me today presents for follow up visit for adenocarcinoma.  Oncology History  Primary adenocarcinoma of right lung (HCC)  08/17/2020 Initial Diagnosis   Primary adenocarcinoma of right lung (HCC)  05/10/2020-05/12/2020, [name], indeterminate right hilar and precarinal borderline enlarged lymphadenopathy. Hepatomegaly.   Sigmoid diverticulosis with no diverticulitis.  Descending thoracic aorta aneurysm 4  cm.  Extensive calcified and noncalcified atherosclerotic plaque of the thoracic and abdominal aorta. 07/13/2020, [name], nonspecific metabolic activity. Nonspecific diffuse colonic activity. Thoracic and abdominal aortic aneurysm  07/30/2020, T10 kyphoplasty. T10 biopsy negative for malignancy.   09/13/20 -09/27/2020 lung radiation.      08/17/2020 Cancer Staging   Staging form: Lung, AJCC 8th Edition - Clinical stage from 08/17/2020: Stage IA2 (cT1b, [name], [name], MD on 08/17/2020   Cholangiocarcinoma (HCC)  05/19/2022 Imaging   CT chest without contrast showed development of 5.3 cm mass in the left hepatic lobe not well-characterized    06/13/2022 Initial Diagnosis   Cholangiocarcinoma  US guided liver biopsy showed      10/10/2022 Imaging   MRI liver with and without contrast showed 1. Numerous hypoenhancing liver masses, largest centered in the anterior left lobe of the liver, [name], measuring at least 8.5 x 6.3 cm, [name], 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, particularly in the left lobe of the liver, [name], assessment limited by breath motion artifact and field of  view, although similar to prior. This is most consistent with peritoneal metastatic disease. 4. No candidate primary malignancy appreciated in the abdomen. 5. Hepatomegaly   10/27/2022 Imaging   PET  1. 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 2. Two metabolic lesions in posterior LEFT ribs with differential including pathologic fractures versus traumatic fracture. Recommend clinical correlation.   11/18/2022 Cancer Staging   Staging form: Intrahepatic Bile Duct, AJCC 8th Edition - Clinical: Stage IV (cT2, [name], cM1) - Signed by Rickard Patience, MD on 11/18/2022 Stage prefix: Initial diagnosis     Patient reports fatigue.  Appetite is fair.  She denies any pain.  Denies any unintentional weight loss.  Denies any blood in the stool or dark stool.  Denies shortness of breath more than her baseline. She was accompanied by her Sister Diane. She is status post ultrasound-guided liver lesion biopsy and requests to have another discussion about treatment options.   MEDICAL HISTORY:  Past Medical History:  Diagnosis Date   Anxiety    Aortic atherosclerosis (HCC)    Arthritis    Cardiomyopathy (HCC)    a. 04/2021 Echo: EF 35-40%, glob HK. GrII DD.  Nl RV size/fxn. Mod dil LA. Mild MR.   Carotid arterial disease (HCC)    a. 04/2021 CTA Head/neck: RICA 60, LICA 55.   COPD (chronic obstructive pulmonary disease) (HCC)    Depression    Diabetes mellitus without complication (HCC)    History of kidney stones    Hypertension    Lung cancer (HCC)    Stroke (cerebrum) (HCC)    a. 04/2021 MRI brain: Acute infarct post limb of R internal capsule.   Thoracic aortic aneurysm (HCC)    Tobacco abuse     SURGICAL HISTORY: Past Surgical History:  Procedure Laterality Date  ABDOMINAL HYSTERECTOMY     CHOLECYSTECTOMY     EYE SURGERY Bilateral    IR KYPHO THORACIC WITH BONE BIOPSY  11/24/2021   IR RADIOLOGIST EVAL & MGMT  12/13/2021    KYPHOPLASTY N/A 07/30/2020   Procedure: T10 Kyphoplasty;  Surgeon: Kennedy Bucker, MD;  Location: [facility] ORS;  Service: Orthopedics;  Laterality: N/A;  T10   KYPHOPLASTY N/A 08/19/2020   Procedure: T9 YPHOPLASTY;  Surgeon: Kennedy Bucker, MD;  Location: [facility] ORS;  Service: Orthopedics;  Laterality: N/A;   LOWER EXTREMITY ANGIOGRAPHY Left 08/07/2022   Procedure: Lower Extremity Angiography;  Surgeon: Annice Needy, MD;  Location: [facility] INVASIVE CV LAB;  Service: Cardiovascular;  Laterality: Left;    SOCIAL HISTORY: Social History   Socioeconomic History   Marital status: Single    Spouse name: Not on file   Number of children: 2   Years of education: Not on file   Highest education level: Not on file  Occupational History   Not on file  Tobacco Use   Smoking status: Every Day    Packs/day: 1.00    Years: 68.00    Additional pack years: 0.00    Total pack years: 68.00    Types: Cigarettes   Smokeless tobacco: Never  Vaping Use   Vaping Use: Never used  Substance and Sexual Activity   Alcohol use: Never   Drug use: Never   Sexual activity: Not on file  Other Topics Concern   Not on file  Social History Narrative   Lives locally by herself: sister lives close to pt.  Does not routinely exercise.   Social Determinants of Health   Financial Resource Strain: Not on file  Food Insecurity: Not on file  Transportation Needs: Not on file  Physical Activity: Not on file  Stress: Not on file  Social Connections: Not on file  Intimate Partner Violence: Not on file    FAMILY HISTORY: Family History  Problem Relation Age of Onset   Dementia Mother    Congestive Heart Failure Mother    Bladder Cancer Father    Heart disease Father    Breast cancer Neg Hx     ALLERGIES:  is allergic to farxiga [dapagliflozin], sulfa antibiotics, and iodinated contrast media.  MEDICATIONS:  Current Outpatient Medications  Medication Sig Dispense Refill   aspirin EC 81 MG EC tablet Take 1 tablet  (81 mg total) by mouth daily. Swallow whole. 30 tablet 11   atorvastatin (LIPITOR) 20 MG tablet Take 20 mg by mouth every Monday, Wednesday, and Friday.     carvedilol (COREG) 6.25 MG tablet TAKE 1 TABLET BY MOUTH TWICE A DAY WITH A MEAL 60 tablet 2   clopidogrel (PLAVIX) 75 MG tablet Take 1 tablet (75 mg total) by mouth daily. 30 tablet 12   cyanocobalamin (VITAMIN B12) 1000 MCG tablet Take 1,000 mcg by mouth daily.     doxepin (SINEQUAN) 25 MG capsule Take 50-75 mg by mouth at bedtime.     furosemide (LASIX) 20 MG tablet TAKE ONE TABLET BY MOUTH DAILY (Patient taking differently: Take 20 mg by mouth 3 (three) times a week.) 30 tablet 4   gabapentin (NEURONTIN) 300 MG capsule Take 300 mg by mouth at bedtime. 200-300 mg when needed     Melatonin 10 MG TABS Take 1 tablet by mouth at bedtime.     metFORMIN (GLUCOPHAGE-XR) 500 MG 24 hr tablet Take 500 mg by mouth 2 (two) times daily.     PARoxetine (PAXIL) 30 MG  tablet Take 30 mg by mouth every morning.     acetaminophen (TYLENOL) 500 MG tablet Take 2 tablets (1,000 mg total) by mouth 3 (three) times daily as needed for mild pain, moderate pain or headache. (Patient not taking: Reported on 11/09/2022) 30 tablet 0   ALPRAZolam (XANAX) 0.25 MG tablet Take 1 tablet (0.25 mg total) by mouth daily as needed for anxiety. 15 tablet 0   cephALEXin (KEFLEX) 500 MG capsule Take 1 capsule (500 mg total) by mouth 2 (two) times daily. 14 capsule 0   Ibuprofen (ADVIL) 200 MG CAPS Take 400 mg by mouth in the morning and at bedtime.     No current facility-administered medications for this visit.    Review of Systems  Constitutional:  Positive for fatigue. Negative for appetite change, chills and fever.  HENT:   Negative for hearing loss and voice change.   Eyes:  Negative for eye problems.  Respiratory:  Negative for chest tightness and cough.   Cardiovascular:  Negative for chest pain.  Gastrointestinal:  Negative for abdominal distention, abdominal pain and  blood in stool.       Hepatomegaly  Endocrine: Negative for hot flashes.  Genitourinary:  Negative for difficulty urinating and frequency.   Musculoskeletal:  Negative for arthralgias.       Scoliosis  Skin:  Negative for itching and rash.  Neurological:  Negative for extremity weakness.  Hematological:  Negative for adenopathy.  Psychiatric/Behavioral:  Negative for confusion.      PHYSICAL EXAMINATION: ECOG PERFORMANCE STATUS: 2 - Symptomatic, <50% confined to bed  Vitals:   11/17/22 1054  BP: (!) 143/73  Pulse: 97  Resp: (!) 22  Temp: (!) 97.2 F (36.2 C)   There were no vitals filed for this visit.   Physical Exam Constitutional:      General: She is not in acute distress.    Appearance: She is obese. She is not diaphoretic.     Comments: Frail appearance female sitting in the wheelchair  HENT:     Head: Normocephalic and atraumatic.  Eyes:     General: No scleral icterus.    Pupils: Pupils are equal, round, and reactive to light.  Cardiovascular:     Rate and Rhythm: Normal rate and regular rhythm.     Heart sounds: No murmur heard. Pulmonary:     Effort: Pulmonary effort is normal. No respiratory distress.  Abdominal:     General: Bowel sounds are normal. There is no distension.     Palpations: Abdomen is soft.  Musculoskeletal:        General: Normal range of motion.     Cervical back: Normal range of motion and neck supple.  Skin:    General: Skin is warm and dry.     Findings: No erythema.  Neurological:     Mental Status: She is alert and oriented to person, place, and time. Mental status is at baseline.     Cranial Nerves: No cranial nerve deficit.     Motor: No abnormal muscle tone.  Psychiatric:        Mood and Affect: Mood and affect normal.      LABORATORY DATA:  I have reviewed the data as listed    Latest Ref Rng & Units 11/02/2022    7:40 AM 10/25/2022   11:30 AM 05/19/2022   12:59 PM  CBC  WBC 4.0 - 10.5 K/uL 6.5  5.6  7.8    Hemoglobin 12.0 - 15.0 g/dL 11.8  11.4  8.8   Hematocrit 36.0 - 46.0 % 39.7  37.4  30.9   Platelets 150 - 400 K/uL 298  279  367       Latest Ref Rng & Units 11/02/2022    7:40 AM 10/25/2022   11:30 AM 08/21/2022    3:04 PM  CMP  Glucose 70 - 99 mg/dL 161  096  045   BUN 8 - 23 mg/dL 14  10  9    Creatinine 0.44 - 1.00 mg/dL 4.09  8.11  9.14   Sodium 135 - 145 mmol/L 137  136  139   Potassium 3.5 - 5.1 mmol/L 4.3  4.1  3.8   Chloride 98 - 111 mmol/L 102  101  105   CO2 22 - 32 mmol/L 26  27  26    Calcium 8.9 - 10.3 mg/dL 8.8  8.4  9.0   Total Protein 6.5 - 8.1 g/dL 6.9  6.5    Total Bilirubin 0.3 - 1.2 mg/dL 0.3  0.5    Alkaline Phos 38 - 126 U/L 173  165    AST 15 - 41 U/L 26  23    ALT 0 - 44 U/L 15  17       RADIOGRAPHIC STUDIES: I have personally reviewed the radiological images as listed and agreed with the findings in the report. US BIOPSY (LIVER)  Result Date: 11/02/2022 INDICATION: Liver mass EXAM: Ultrasound-guided core needle biopsy of focal liver lesion MEDICATIONS: None. ANESTHESIA/SEDATION: Moderate (conscious) sedation was employed during this procedure. A total of Versed 0.5 mg and Fentanyl 50 mcg was administered intravenously by the radiology nurse. Total intra-service moderate Sedation Time: 14 minutes. The patient's level of consciousness and vital signs were monitored continuously by radiology nursing throughout the procedure under my direct supervision. COMPLICATIONS: None immediate. PROCEDURE: Informed written consent was obtained from the patient after a thorough discussion of the procedural risks, benefits and alternatives. All questions were addressed. Maximal Sterile Barrier Technique was utilized including caps, mask, sterile gowns, sterile gloves, sterile drape, hand hygiene and skin antiseptic. A timeout was performed prior to the initiation of the procedure. The patient was placed supine on the exam table. Ultrasound of the liver demonstrated large  heterogeneous lesion within the central portion of the liver. Skin entry site was marked, and the overlying skin was prepped draped in the standard sterile fashion. Local analgesia was obtained with 1% lidocaine. Using ultrasound guidance, a 17 gauge introducer needle was advanced just deep to the identified lesion. Subsequently, core needle biopsy was performed of the large liver lesion using an 18 gauge core biopsy device x4 total passes. Specimens were submitted in formalin to pathology for further handling. Limited postprocedure imaging demonstrated no hematoma. A clean dressing was placed after manual hemostasis. The patient tolerated the procedure well without immediate complication. IMPRESSION: Successful ultrasound-guided core needle biopsy of large central liver lesion. Electronically Signed   By: Olive Bass M.D.   On: 11/02/2022 16:06   NM PET Image Initial (PI) Skull Base To Thigh  Result Date: 11/02/2022 CLINICAL DATA:  Subsequent treatment strategy for liver mass. History of lung cancer diagnosed 2022 EXAM: NUCLEAR MEDICINE PET SKULL BASE TO THIGH TECHNIQUE: 7.45 mCi F-18 FDG was injected intravenously. Full-ring PET imaging was performed from the skull base to thigh after the radiotracer. CT data was obtained and used for attenuation correction and anatomic localization. Fasting blood glucose: 99 mg/dl COMPARISON:  MRI 78/29/5621 FINDINGS: Mediastinal blood pool activity: SUV max 1.7  Liver activity: SUV max NA NECK: No hypermetabolic lymph nodes in the neck. Incidental CT findings: None. CHEST: No hypermetabolic mediastinal or hilar nodes. No suspicious pulmonary nodules on the CT scan. Incidental CT findings: Atelectasis in the lingula without metabolic activity. ABDOMEN/PELVIS: Confluent intensely hypermetabolic lesions within the LEFT hepatic lobe involving all segments. Additional smaller lesions in the RIGHT hepatic lobe. Confluency conglomerate LEFT hepatic lobe mass with SUV max equal  8.7. This corresponds to hypoenhancing mass on comparison MRI. No hypermetabolic abdominal lymph nodes. Physiologic activity within the bowel. Incidental CT findings: None. SKELETON: Metabolic activity associated with 2 LEFT ribs. Nondisplaced fracture associated with the LEFT lateral lateral metabolic activity on image 39. There is periosteal reaction associated more posterior lesion on image 42. Incidental CT findings: None. IMPRESSION: 1. 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 2. Two metabolic lesions in posterior LEFT ribs with differential including pathologic fractures versus traumatic fracture. Recommend clinical correlation. Electronically Signed   By: Genevive Bi M.D.   On: 11/02/2022 08:26

## 2022-11-18 NOTE — Assessment & Plan Note (Signed)
Discussed with patient.  She is aware that her condition is not curable Palliative intent. 

## 2022-11-18 NOTE — Assessment & Plan Note (Signed)
AFP is negative, CEA is elevated at 10.  CA 19-9 400s. Pathology showed CK7 positive for adenocarcinoma, negative for CK20  Patient does not have pancreatic lesion on CT, PET scan and MRI. 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  Differential diagnosis includes upper GI origin. Regardless of the etiology, patient's options is limited.  She has multiple comorbidities including cardiomyopathy, COPD, diabetes, stroke, hypertension, arthritis etc.  Her performance status is poor.  Most likely not able to tolerate aggressive chemotherapy. Patient understands that her prognosis is poor and prefers not to take chemotherapy treatments. She is interested in hospice/comfort care and will meet palliative care service today.

## 2022-11-22 ENCOUNTER — Ambulatory Visit: Payer: Medicare Other | Admitting: Internal Medicine

## 2022-11-27 ENCOUNTER — Encounter: Payer: Self-pay | Admitting: Hospice and Palliative Medicine

## 2022-11-27 ENCOUNTER — Other Ambulatory Visit: Payer: Self-pay | Admitting: *Deleted

## 2022-11-27 DIAGNOSIS — R160 Hepatomegaly, not elsewhere classified: Secondary | ICD-10-CM

## 2022-12-05 ENCOUNTER — Encounter (INDEPENDENT_AMBULATORY_CARE_PROVIDER_SITE_OTHER): Payer: Medicare Other

## 2022-12-05 ENCOUNTER — Ambulatory Visit (INDEPENDENT_AMBULATORY_CARE_PROVIDER_SITE_OTHER): Payer: Medicare Other | Admitting: Vascular Surgery

## 2022-12-15 ENCOUNTER — Other Ambulatory Visit: Payer: Self-pay | Admitting: Internal Medicine

## 2022-12-15 NOTE — Telephone Encounter (Signed)
Please contact pt for future appointment. Pt due for f/u.  

## 2022-12-15 NOTE — Telephone Encounter (Signed)
Patient is under hospice care unable to schedule fu

## 2022-12-25 ENCOUNTER — Telehealth: Payer: Self-pay

## 2022-12-25 NOTE — Telephone Encounter (Signed)
Spoke with patient's sister.  Questions answered.  She has planned follow-up with the hospice nurse.

## 2022-12-25 NOTE — Telephone Encounter (Signed)
Patient sister called because she was unsure about patient medications. Patient sister states she thought since the patient is on hospice there was certain medication patient is supposed to stop taking. She also stated she had to pay out of pocket for the prescription Doxepin and was under the impression that hospice pays for medications. She also wants to know if Marissa Fletcher will be the one to Authorize refills.  Callback [phone]

## 2023-03-23 DEATH — deceased

## 2023-06-12 ENCOUNTER — Ambulatory Visit (INDEPENDENT_AMBULATORY_CARE_PROVIDER_SITE_OTHER): Payer: Medicare Other | Admitting: Vascular Surgery

## 2023-06-12 ENCOUNTER — Other Ambulatory Visit (INDEPENDENT_AMBULATORY_CARE_PROVIDER_SITE_OTHER): Payer: Medicare Other
